# Patient Record
Sex: Male | Born: 1950 | Race: Black or African American | Hispanic: No | Marital: Married | Smoking: Former smoker
Health system: Southern US, Community
[De-identification: ages and names within clinical notes are randomized; demographics above are authoritative.]

## PROBLEM LIST (undated history)

## (undated) DIAGNOSIS — D649 Anemia, unspecified: Secondary | ICD-10-CM

## (undated) DIAGNOSIS — H269 Unspecified cataract: Secondary | ICD-10-CM

## (undated) DIAGNOSIS — I10 Essential (primary) hypertension: Secondary | ICD-10-CM

## (undated) DIAGNOSIS — N189 Chronic kidney disease, unspecified: Secondary | ICD-10-CM

## (undated) DIAGNOSIS — K591 Functional diarrhea: Secondary | ICD-10-CM

## (undated) DIAGNOSIS — N529 Male erectile dysfunction, unspecified: Secondary | ICD-10-CM

## (undated) DIAGNOSIS — M1990 Unspecified osteoarthritis, unspecified site: Secondary | ICD-10-CM

## (undated) DIAGNOSIS — G629 Polyneuropathy, unspecified: Secondary | ICD-10-CM

## (undated) HISTORY — PX: COLONOSCOPY: SHX174

## (undated) HISTORY — PX: PROSTATE SURGERY: SHX751

## (undated) HISTORY — DX: Unspecified cataract: H26.9

## (undated) HISTORY — DX: Essential (primary) hypertension: I10

## (undated) HISTORY — PX: KIDNEY TRANSPLANT: SHX239

## (undated) HISTORY — PX: UPPER GASTROINTESTINAL ENDOSCOPY: SHX188

## (undated) HISTORY — DX: Polyneuropathy, unspecified: G62.9

## (undated) HISTORY — DX: Male erectile dysfunction, unspecified: N52.9

## (undated) HISTORY — PX: APPENDECTOMY: SHX54

---

## 1997-12-13 ENCOUNTER — Emergency Department (HOSPITAL_COMMUNITY): Admission: EM | Admit: 1997-12-13 | Discharge: 1997-12-13 | Payer: Self-pay | Admitting: Emergency Medicine

## 1997-12-25 ENCOUNTER — Emergency Department (HOSPITAL_COMMUNITY): Admission: EM | Admit: 1997-12-25 | Discharge: 1997-12-25 | Payer: Self-pay | Admitting: Emergency Medicine

## 1998-05-21 ENCOUNTER — Emergency Department (HOSPITAL_COMMUNITY): Admission: EM | Admit: 1998-05-21 | Discharge: 1998-05-21 | Payer: Self-pay | Admitting: Emergency Medicine

## 1998-07-05 ENCOUNTER — Encounter: Admission: RE | Admit: 1998-07-05 | Discharge: 1998-07-11 | Payer: Self-pay | Admitting: Orthopedic Surgery

## 1998-10-08 ENCOUNTER — Encounter: Admission: RE | Admit: 1998-10-08 | Discharge: 1998-10-10 | Payer: Self-pay | Admitting: Orthopedic Surgery

## 1999-01-06 ENCOUNTER — Encounter: Admission: RE | Admit: 1999-01-06 | Discharge: 1999-01-10 | Payer: Self-pay | Admitting: Orthopedic Surgery

## 1999-04-14 ENCOUNTER — Encounter: Admission: RE | Admit: 1999-04-14 | Discharge: 1999-07-13 | Payer: Self-pay | Admitting: Orthopedic Surgery

## 2000-08-25 ENCOUNTER — Encounter: Admission: RE | Admit: 2000-08-25 | Discharge: 2000-08-30 | Payer: Self-pay | Admitting: Internal Medicine

## 2000-12-08 ENCOUNTER — Encounter: Admission: RE | Admit: 2000-12-08 | Discharge: 2000-12-14 | Payer: Self-pay | Admitting: Surgery

## 2001-03-30 ENCOUNTER — Encounter: Admission: RE | Admit: 2001-03-30 | Discharge: 2001-04-04 | Payer: Self-pay | Admitting: Internal Medicine

## 2007-02-02 ENCOUNTER — Ambulatory Visit: Payer: Self-pay | Admitting: Family Medicine

## 2007-02-02 DIAGNOSIS — I10 Essential (primary) hypertension: Secondary | ICD-10-CM

## 2007-02-02 LAB — CONVERTED CEMR LAB
Alkaline Phosphatase: 50 units/L (ref 39–117)
BUN: 7 mg/dL (ref 6–23)
Basophils Relative: 0.2 % (ref 0.0–1.0)
Bilirubin, Direct: 0.2 mg/dL (ref 0.0–0.3)
CO2: 33 meq/L — ABNORMAL HIGH (ref 19–32)
Cholesterol: 172 mg/dL (ref 0–200)
Eosinophils Absolute: 0 10*3/uL (ref 0.0–0.6)
GFR calc Af Amer: 149 mL/min
GFR calc non Af Amer: 123 mL/min
Glucose, Bld: 347 mg/dL — ABNORMAL HIGH (ref 70–99)
HDL: 36.3 mg/dL — ABNORMAL LOW (ref >39.0)
Hemoglobin: 14.7 g/dL (ref 13.0–17.0)
Lymphocytes Relative: 24.4 % (ref 12.0–46.0)
MCHC: 35 g/dL (ref 30.0–36.0)
MCV: 86.6 fL (ref 78.0–100.0)
Monocytes Absolute: 0.5 10*3/uL (ref 0.2–0.7)
Monocytes Relative: 8 % (ref 3.0–11.0)
Neutro Abs: 3.8 10*3/uL (ref 1.4–7.7)
Potassium: 3.8 meq/L (ref 3.5–5.1)
TSH: 1.45 microintl units/mL (ref 0.35–5.50)
Total Protein: 6.7 g/dL (ref 6.0–8.3)
Triglycerides: 132 mg/dL (ref 0–149)
VLDL: 26 mg/dL (ref 0–40)

## 2007-02-07 ENCOUNTER — Ambulatory Visit: Payer: Self-pay | Admitting: Family Medicine

## 2007-02-14 ENCOUNTER — Ambulatory Visit: Payer: Self-pay | Admitting: Family Medicine

## 2007-02-14 DIAGNOSIS — E139 Other specified diabetes mellitus without complications: Secondary | ICD-10-CM | POA: Insufficient documentation

## 2007-03-16 ENCOUNTER — Ambulatory Visit: Payer: Self-pay | Admitting: Family Medicine

## 2007-03-16 LAB — CONVERTED CEMR LAB: Blood Glucose, Fingerstick: 75

## 2007-04-25 ENCOUNTER — Ambulatory Visit: Payer: Self-pay | Admitting: Family Medicine

## 2007-05-20 ENCOUNTER — Telehealth: Payer: Self-pay | Admitting: Family Medicine

## 2007-07-20 ENCOUNTER — Ambulatory Visit: Payer: Self-pay | Admitting: Family Medicine

## 2007-07-20 DIAGNOSIS — G609 Hereditary and idiopathic neuropathy, unspecified: Secondary | ICD-10-CM

## 2007-07-20 DIAGNOSIS — I10 Essential (primary) hypertension: Secondary | ICD-10-CM | POA: Insufficient documentation

## 2007-07-20 LAB — CONVERTED CEMR LAB
Hgb A1c MFr Bld: 6.7 % — ABNORMAL HIGH (ref 4.6–6.0)
LDL Cholesterol: 93 mg/dL (ref 0–99)
Total CHOL/HDL Ratio: 3.3

## 2007-08-23 ENCOUNTER — Ambulatory Visit: Payer: Self-pay | Admitting: Family Medicine

## 2008-02-22 ENCOUNTER — Ambulatory Visit: Payer: Self-pay | Admitting: Family Medicine

## 2008-06-05 ENCOUNTER — Ambulatory Visit: Payer: Self-pay | Admitting: Family Medicine

## 2008-06-05 LAB — CONVERTED CEMR LAB
AST: 15 units/L (ref 0–37)
Albumin: 3.9 g/dL (ref 3.5–5.2)
BUN: 9 mg/dL (ref 6–23)
Basophils Relative: 0.7 % (ref 0.0–3.0)
Chloride: 105 meq/L (ref 96–112)
Creatinine, Ser: 0.7 mg/dL (ref 0.4–1.5)
Creatinine,U: 150.7 mg/dL
Eosinophils Absolute: 0.2 10*3/uL (ref 0.0–0.7)
Eosinophils Relative: 3.4 % (ref 0.0–5.0)
GFR calc non Af Amer: 123 mL/min
HCT: 43.4 % (ref 39.0–52.0)
Hemoglobin: 15.2 g/dL (ref 13.0–17.0)
Hgb A1c MFr Bld: 10.1 % — ABNORMAL HIGH (ref 4.6–6.0)
MCV: 88.9 fL (ref 78.0–100.0)
Monocytes Absolute: 0.4 10*3/uL (ref 0.1–1.0)
Neutrophils Relative %: 56.6 % (ref 43.0–77.0)
RBC: 4.88 M/uL (ref 4.22–5.81)
Specific Gravity, Urine: 1.025
WBC Urine, dipstick: NEGATIVE
WBC: 5 10*3/uL (ref 4.5–10.5)
pH: 6

## 2008-06-12 ENCOUNTER — Ambulatory Visit: Payer: Self-pay | Admitting: Family Medicine

## 2008-06-12 DIAGNOSIS — F528 Other sexual dysfunction not due to a substance or known physiological condition: Secondary | ICD-10-CM

## 2008-06-13 ENCOUNTER — Telehealth: Payer: Self-pay | Admitting: *Deleted

## 2008-06-14 ENCOUNTER — Telehealth: Payer: Self-pay | Admitting: Family Medicine

## 2008-06-20 ENCOUNTER — Ambulatory Visit: Payer: Self-pay | Admitting: Family Medicine

## 2008-07-13 ENCOUNTER — Telehealth: Payer: Self-pay | Admitting: Family Medicine

## 2008-08-16 ENCOUNTER — Telehealth: Payer: Self-pay | Admitting: Family Medicine

## 2008-08-16 ENCOUNTER — Ambulatory Visit: Payer: Self-pay | Admitting: Family Medicine

## 2008-08-16 LAB — CONVERTED CEMR LAB
BUN: 11 mg/dL (ref 6–23)
CO2: 31 meq/L (ref 19–32)
Chloride: 105 meq/L (ref 96–112)
Glucose, Bld: 164 mg/dL — ABNORMAL HIGH (ref 70–99)
Potassium: 4.3 meq/L (ref 3.5–5.1)

## 2008-08-23 ENCOUNTER — Ambulatory Visit: Payer: Self-pay | Admitting: Family Medicine

## 2008-08-24 ENCOUNTER — Ambulatory Visit: Payer: Self-pay | Admitting: Family Medicine

## 2008-09-04 LAB — CONVERTED CEMR LAB
ALT: 19 units/L (ref 0–53)
AST: 16 units/L (ref 0–37)
Albumin: 3.9 g/dL (ref 3.5–5.2)
Alkaline Phosphatase: 46 units/L (ref 39–117)
Basophils Relative: 0.4 % (ref 0.0–3.0)
Eosinophils Relative: 3.2 % (ref 0.0–5.0)
Lymphocytes Relative: 27.7 % (ref 12.0–46.0)
Lymphs Abs: 1.6 10*3/uL (ref 0.7–4.0)
Monocytes Absolute: 0.5 10*3/uL (ref 0.1–1.0)
Monocytes Relative: 8.7 % (ref 3.0–12.0)
Neutro Abs: 3.5 10*3/uL (ref 1.4–7.7)
Platelets: 236 10*3/uL (ref 150.0–400.0)
RDW: 12 % (ref 11.5–14.6)
Total Protein: 7.3 g/dL (ref 6.0–8.3)

## 2008-11-22 ENCOUNTER — Ambulatory Visit: Payer: Self-pay | Admitting: Family Medicine

## 2008-11-22 LAB — CONVERTED CEMR LAB
BUN: 13 mg/dL (ref 6–23)
Creatinine, Ser: 0.8 mg/dL (ref 0.4–1.5)
GFR calc non Af Amer: 126.86 mL/min (ref >60)
Glucose, Bld: 103 mg/dL — ABNORMAL HIGH (ref 70–99)
Potassium: 4 meq/L (ref 3.5–5.1)

## 2008-11-26 ENCOUNTER — Telehealth: Payer: Self-pay | Admitting: *Deleted

## 2008-11-29 ENCOUNTER — Ambulatory Visit: Payer: Self-pay | Admitting: Family Medicine

## 2009-02-22 ENCOUNTER — Ambulatory Visit: Payer: Self-pay | Admitting: Family Medicine

## 2009-02-22 LAB — CONVERTED CEMR LAB
BUN: 12 mg/dL (ref 6–23)
Calcium: 8.9 mg/dL (ref 8.4–10.5)
Chloride: 99 meq/L (ref 96–112)
Creatinine, Ser: 1 mg/dL (ref 0.4–1.5)
Hgb A1c MFr Bld: 6.2 % (ref 4.6–6.5)

## 2009-03-01 ENCOUNTER — Ambulatory Visit: Payer: Self-pay | Admitting: Family Medicine

## 2009-03-27 ENCOUNTER — Telehealth: Payer: Self-pay | Admitting: Family Medicine

## 2009-04-01 ENCOUNTER — Ambulatory Visit: Payer: Self-pay | Admitting: Family Medicine

## 2009-04-29 ENCOUNTER — Ambulatory Visit: Payer: Self-pay | Admitting: Family Medicine

## 2009-06-07 ENCOUNTER — Ambulatory Visit: Payer: Self-pay | Admitting: Family Medicine

## 2009-06-07 LAB — CONVERTED CEMR LAB
Basophils Relative: 0.5 % (ref 0.0–3.0)
Bilirubin Urine: NEGATIVE
Calcium: 9.6 mg/dL (ref 8.4–10.5)
Creatinine, Ser: 0.8 mg/dL (ref 0.4–1.5)
Eosinophils Relative: 6.1 % — ABNORMAL HIGH (ref 0.0–5.0)
GFR calc non Af Amer: 126.63 mL/min (ref >60)
HCT: 41.8 % (ref 39.0–52.0)
HDL: 42.6 mg/dL (ref >39.00)
Hgb A1c MFr Bld: 6.4 % (ref 4.6–6.5)
LDL Cholesterol: 90 mg/dL (ref 0–99)
Lymphs Abs: 1.8 10*3/uL (ref 0.7–4.0)
MCV: 89.5 fL (ref 78.0–100.0)
Microalb, Ur: 7 mg/dL — ABNORMAL HIGH (ref 0.0–1.9)
Monocytes Absolute: 0.5 10*3/uL (ref 0.1–1.0)
Neutrophils Relative %: 57.1 % (ref 43.0–77.0)
Nitrite: NEGATIVE
RBC: 4.67 M/uL (ref 4.22–5.81)
TSH: 1.86 microintl units/mL (ref 0.35–5.50)
Total Bilirubin: 0.9 mg/dL (ref 0.3–1.2)
Total CHOL/HDL Ratio: 4
Triglycerides: 145 mg/dL (ref 0.0–149.0)
Urobilinogen, UA: 0.2
WBC: 6.4 10*3/uL (ref 4.5–10.5)

## 2009-06-14 ENCOUNTER — Ambulatory Visit: Payer: Self-pay | Admitting: Family Medicine

## 2009-06-14 LAB — HM DIABETES FOOT EXAM

## 2009-06-28 ENCOUNTER — Ambulatory Visit: Payer: Self-pay | Admitting: Family Medicine

## 2009-07-26 ENCOUNTER — Ambulatory Visit: Payer: Self-pay | Admitting: Family Medicine

## 2009-07-29 LAB — CONVERTED CEMR LAB
CO2: 30 meq/L (ref 19–32)
Calcium: 9.3 mg/dL (ref 8.4–10.5)
Hgb A1c MFr Bld: 7.2 % — ABNORMAL HIGH (ref 4.6–6.5)
Sodium: 141 meq/L (ref 135–145)

## 2009-10-09 ENCOUNTER — Telehealth: Payer: Self-pay | Admitting: Family Medicine

## 2009-10-24 ENCOUNTER — Ambulatory Visit: Payer: Self-pay | Admitting: Family Medicine

## 2009-10-24 LAB — CONVERTED CEMR LAB
GFR calc non Af Amer: 67.98 mL/min (ref >60)
Glucose, Bld: 90 mg/dL (ref 70–99)
Hgb A1c MFr Bld: 6.2 % (ref 4.6–6.5)
Potassium: 5 meq/L (ref 3.5–5.1)
Sodium: 143 meq/L (ref 135–145)

## 2009-10-31 ENCOUNTER — Ambulatory Visit: Payer: Self-pay | Admitting: Family Medicine

## 2009-10-31 DIAGNOSIS — R05 Cough: Secondary | ICD-10-CM

## 2010-01-30 ENCOUNTER — Ambulatory Visit: Payer: Self-pay | Admitting: Family Medicine

## 2010-01-31 LAB — CONVERTED CEMR LAB
BUN: 12 mg/dL (ref 6–23)
Chloride: 104 meq/L (ref 96–112)
Hgb A1c MFr Bld: 7.4 % — ABNORMAL HIGH (ref 4.6–6.5)
Potassium: 4.5 meq/L (ref 3.5–5.1)
Sodium: 143 meq/L (ref 135–145)

## 2010-04-28 ENCOUNTER — Emergency Department (HOSPITAL_COMMUNITY)
Admission: EM | Admit: 2010-04-28 | Discharge: 2010-04-28 | Payer: Self-pay | Source: Home / Self Care | Admitting: Emergency Medicine

## 2010-06-16 ENCOUNTER — Other Ambulatory Visit: Payer: Self-pay | Admitting: Family Medicine

## 2010-06-16 ENCOUNTER — Ambulatory Visit
Admission: RE | Admit: 2010-06-16 | Discharge: 2010-06-16 | Payer: Self-pay | Source: Home / Self Care | Attending: Family Medicine | Admitting: Family Medicine

## 2010-06-16 LAB — LIPID PANEL
Cholesterol: 208 mg/dL — ABNORMAL HIGH (ref 0–200)
Total CHOL/HDL Ratio: 5
Triglycerides: 284 mg/dL — ABNORMAL HIGH (ref 0.0–149.0)

## 2010-06-16 LAB — CBC WITH DIFFERENTIAL/PLATELET
Basophils Absolute: 0 10*3/uL (ref 0.0–0.1)
Basophils Relative: 0.9 % (ref 0.0–3.0)
Eosinophils Absolute: 0.1 10*3/uL (ref 0.0–0.7)
Lymphocytes Relative: 33.4 % (ref 12.0–46.0)
MCHC: 35 g/dL (ref 30.0–36.0)
MCV: 88.1 fl (ref 78.0–100.0)
Monocytes Absolute: 0.4 10*3/uL (ref 0.1–1.0)
Neutrophils Relative %: 54.5 % (ref 43.0–77.0)
Platelets: 270 10*3/uL (ref 150.0–400.0)
RDW: 12.9 % (ref 11.5–14.6)

## 2010-06-16 LAB — BASIC METABOLIC PANEL
CO2: 30 mEq/L (ref 19–32)
Calcium: 9.5 mg/dL (ref 8.4–10.5)
Chloride: 101 mEq/L (ref 96–112)
Glucose, Bld: 206 mg/dL — ABNORMAL HIGH (ref 70–99)
Sodium: 136 mEq/L (ref 135–145)

## 2010-06-16 LAB — HEMOGLOBIN A1C: Hgb A1c MFr Bld: 7.8 % — ABNORMAL HIGH (ref 4.6–6.5)

## 2010-06-16 LAB — HEPATIC FUNCTION PANEL
ALT: 17 U/L (ref 0–53)
Albumin: 4 g/dL (ref 3.5–5.2)
Alkaline Phosphatase: 47 U/L (ref 39–117)
Bilirubin, Direct: 0.1 mg/dL (ref 0.0–0.3)
Total Protein: 7.2 g/dL (ref 6.0–8.3)

## 2010-06-16 LAB — CONVERTED CEMR LAB
Bilirubin Urine: NEGATIVE
pH: 5

## 2010-06-16 LAB — PSA: PSA: 2.49 ng/mL (ref 0.10–4.00)

## 2010-06-16 LAB — MICROALBUMIN / CREATININE URINE RATIO: Microalb Creat Ratio: 7.7 mg/g (ref 0.0–30.0)

## 2010-06-19 NOTE — Assessment & Plan Note (Signed)
Summary: 1 month rov/njr   Vital Signs:  Patient profile:   60 year old male Weight:      182 pounds Temp:     98.5 degrees F oral BP sitting:   130 / 80  (left arm) Cuff size:   regular  Vitals Entered By: Westley Hummer CMA Deborra Medina) (July 26, 2009 10:00 AM)  Reason for Visit follow up office visit  History of Present Illness: [name] [name] is a 60 year old, married male, nonsmoker, who comes in today for follow-up of hypertension and diabetes.  We increased his beta blocker a month ago because his blood pressure was elevated.  He is now taking 100 mg of Tenormin in the morning and 50 mg at bedtime.  BP has dropped down to normal today 130/80.  His blood sugar continues to remain normal.  The low hundred.  His A1c in January was 6.4%.  Will repeat A1c today  Allergies: No Known Drug Allergies  Past History:  Past medical, surgical, family and social histories (including risk factors) reviewed for relevance to current acute and chronic problems.  Past Medical History: Reviewed history from 06/12/2008 and no changes required. Diabetes mellitus, type II Hypertension Peripheral neuropathy ED Diabetes mellitus, type I  Family History: Reviewed history from 07/20/2007 and no changes required. Family History Diabetes 1st degree relative Family History Hypertension  Social History: Reviewed history from 06/14/2009 and no changes required. Never Smoked Alcohol use-no Drug use-no Regular exercise-yes Occupation:  [employer]   Review of Systems      See HPI  Physical Exam  General:  Well-developed,well-nourished,in no acute distress; alert,appropriate and cooperative throughout examination   Impression & Recommendations:  Problem # 1:  DIABETES MELLITUS, TYPE I (ICD-250.01) Assessment Unchanged  His updated medication list for this problem includes:    Glucotrol 5 Mg Tabs (Glipizide) .Marland Kitchen... Take 1 tablet by mouth two times a day    Glucophage 1000 Mg Tabs (Metformin hcl)  .Marland Kitchen... Take 1 tablet by mouth two times a day    Zestril 40 Mg Tabs (Lisinopril) .Marland Kitchen... 2 by mouth qam    Aspirin Ec 325 Mg Tbec (Aspirin) ..... Once daily    Lantus Solostar 100 Unit/ml Soln (Insulin glargine) .Marland Kitchen... 16 u at bedtime  Orders: Venipuncture [id number]) TLB-BMP (Basic Metabolic Panel-BMET) (99991111) TLB-A1C / Hgb A1C (Glycohemoglobin) (83036-A1C)  Problem # 2:  HYPERTENSION (ICD-401.9) Assessment: Improved  His updated medication list for this problem includes:    Zestril 40 Mg Tabs (Lisinopril) .Marland Kitchen... 2 by mouth qam    Tenormin 50 Mg Tabs (Atenolol) .Marland Kitchen... 2 in am, 1 in pm    Hydrochlorothiazide 25 Mg Tabs (Hydrochlorothiazide) .Marland Kitchen... Take 1 tablet by mouth every morning  Orders: Venipuncture [id number]) TLB-BMP (Basic Metabolic Panel-BMET) (99991111) TLB-A1C / Hgb A1C (Glycohemoglobin) (83036-A1C)  Complete Medication List: 1)  Glucotrol 5 Mg Tabs (Glipizide) .... Take 1 tablet by mouth two times a day 2)  Glucophage 1000 Mg Tabs (Metformin hcl) .... Take 1 tablet by mouth two times a day 3)  Amitriptyline Hcl 25 Mg Tabs (Amitriptyline hcl) .Marland Kitchen.. 1 tab @ bedtime 4)  Viagra 100 Mg Tabs (Sildenafil citrate) .... Uad 5)  Zestril 40 Mg Tabs (Lisinopril) .... 2 by mouth qam 6)  Aspirin Ec 325 Mg Tbec (Aspirin) .... Once daily 7)  Lantus Solostar 100 Unit/ml Soln (Insulin glargine) .Marland Kitchen.. 16 u at bedtime 8)  Accu-chek Aviva Strp (Glucose blood) .... Uad 9)  Bd Pen Needle Ultrafine 29g X 12.87mm Misc (Insulin pen needle) .... Use  once daily for glucose 10)  Tenormin 50 Mg Tabs (Atenolol) .... 2 in am, 1 in pm 11)  Hydrochlorothiazide 25 Mg Tabs (Hydrochlorothiazide) .... Take 1 tablet by mouth every morning  Patient Instructions: 1)  continue your current medications.  We will call you  the report on your A1c 2)  Please schedule a follow-up appointment in 3 months. 3)  BMP prior to visit, ICD-9: 4)  HbgA1C prior to visit, ICD-9:

## 2010-06-19 NOTE — Assessment & Plan Note (Signed)
Summary: cpx/njr   Vital Signs:  Patient profile:   60 year old male Height:      69 inches Weight:      180 pounds BMI:     26.68 Temp:     98.0 degrees F oral BP sitting:   160 / 90  (left arm) Cuff size:   regular  Vitals Entered By: Westley Hummer CMA Deborra Medina) (June 14, 2009 8:39 AM)  Reason for Visit cpx  History of Present Illness: [name] [name] is a delightful, 60 year old male, nonsmoker, married, who comes in today for a ration of diabetes, hypertension, erectile dysfunction.  His diabetes is managed with Glucotrol 5 mg b.i.d., Glucophage 1000 mg b.i.d., insulin, Lantus 16 units nightly  Fasting blood sugars at home average 85.  Hemoglobin A1c today 6.4%.  His hypertension is treated with Zestril 80 mg daily, Tenormin, 50 mg b.i.d. BP is averaging systolic [id number] to 0000000 diastolic 80 to 90.  We discussed the goal of 130/80.  Pulse is around 70 and regular.  No side effects from Tenormin.  Will add low-dose diuretic.  He uses Viagra 100 mg p.r.n. for rectal dysfunction.  He uses Elavil 25 mg nightly for symptoms of mild peripheral neuropathy.  He also takes one aspirin tablet daily.  Tetanus 2004 seasonal flu 2010 Pneumovax 2010 exam September 2009 and an optometrist office.  Recommend he see an ophthalmologist because of his diabetes referred to Dr. Darleen Crocker.  Allergies (verified): No Known Drug Allergies  Past History:  Past medical, surgical, family and social histories (including risk factors) reviewed, and no changes noted (except as noted below).  Past Medical History: Reviewed history from 06/12/2008 and no changes required. Diabetes mellitus, type II Hypertension Peripheral neuropathy ED Diabetes mellitus, type I  Family History: Reviewed history from 07/20/2007 and no changes required. Family History Diabetes 1st degree relative Family History Hypertension  Social History: Reviewed history from 07/20/2007 and no changes required. Never Smoked Alcohol  use-no Drug use-no Regular exercise-yes Occupation:  [employer]   Review of Systems      See HPI  Physical Exam  General:  Well-developed,well-nourished,in no acute distress; alert,appropriate and cooperative throughout examination Head:  Normocephalic and atraumatic without obvious abnormalities. No apparent alopecia or balding. Eyes:  No corneal or conjunctival inflammation noted. EOMI. Perrla. Funduscopic exam benign, without hemorrhages, exudates or papilledema. Vision grossly normal. Ears:  External ear exam shows no significant lesions or deformities.  Otoscopic examination reveals clear canals, tympanic membranes are intact bilaterally without bulging, retraction, inflammation or discharge. Hearing is grossly normal bilaterally. Nose:  External nasal examination shows no deformity or inflammation. Nasal mucosa are pink and moist without lesions or exudates. Mouth:  Oral mucosa and oropharynx without lesions or exudates.  Teeth in good repair. Neck:  No deformities, masses, or tenderness noted. Chest Wall:  No deformities, masses, tenderness or gynecomastia noted. Breasts:  No masses or gynecomastia noted Lungs:  Normal respiratory effort, chest expands symmetrically. Lungs are clear to auscultation, no crackles or wheezes. Heart:  Normal rate and regular rhythm. S1 and S2 normal without gallop, murmur, click, rub or other extra sounds. Abdomen:  Bowel sounds positive,abdomen soft and non-tender without masses, organomegaly or hernias noted. Rectal:  No external abnormalities noted. Normal sphincter tone. No rectal masses or tenderness. Genitalia:  Testes bilaterally descended without nodularity, tenderness or masses. No scrotal masses or lesions. No penis lesions or urethral discharge. Prostate:  Prostate gland firm and smooth, no enlargement, nodularity, tenderness, mass, asymmetry or induration.  Msk:  No deformity or scoliosis noted of thoracic or lumbar spine.   Pulses:  R and L  carotid,radial,femoral,dorsalis pedis and posterior tibial pulses are full and equal bilaterally Extremities:  No clubbing, cyanosis, edema, or deformity noted with normal full range of motion of all joints.   Neurologic:  No cranial nerve deficits noted. Station and gait are normal. Plantar reflexes are down-going bilaterally. DTRs are symmetrical throughout. Sensory, motor and coordinative functions appear intact. Skin:  Intact without suspicious lesions or rashes...................Marland KitchenAllergies:  10 nails infected with fungus, deformed, trimmed by me Cervical Nodes:  No lymphadenopathy noted Axillary Nodes:  No palpable lymphadenopathy Inguinal Nodes:  No significant adenopathy Psych:  Cognition and judgment appear intact. Alert and cooperative with normal attention span and concentration. No apparent delusions, illusions, hallucinations  Diabetes Management Exam:    Foot Exam (with socks and/or shoes not present):       Sensory-Pinprick/Light touch:          Left medial foot (L-4): normal          Left dorsal foot (L-5): normal          Left lateral foot (S-1): normal          Right medial foot (L-4): normal          Right dorsal foot (L-5): normal          Right lateral foot (S-1): normal       Sensory-Monofilament:          Left foot: normal          Right foot: normal       Inspection:          Left foot: normal          Right foot: normal       Nails:          Left foot: normal          Right foot: normal    Eye Exam:       Eye Exam done elsewhere          Date: 01/30/2008          Results: normal          Done by: optom    Impression & Recommendations:  Problem # 1:  ERECTILE DYSFUNCTION, MILD (ICD-302.72) Assessment Improved  His updated medication list for this problem includes:    Viagra 100 Mg Tabs (Sildenafil citrate) ..... Uad  Orders: Prescription Created Electronically [phone])  Problem # 2:  DIABETES MELLITUS, TYPE I (ICD-250.01) Assessment: Improved  His  updated medication list for this problem includes:    Glucotrol 5 Mg Tabs (Glipizide) .Marland Kitchen... Take 1 tablet by mouth two times a day    Glucophage 1000 Mg Tabs (Metformin hcl) .Marland Kitchen... Take 1 tablet by mouth two times a day    Zestril 40 Mg Tabs (Lisinopril) .Marland Kitchen... 2 by mouth qam    Aspirin Ec 325 Mg Tbec (Aspirin) ..... Once daily    Lantus Solostar 100 Unit/ml Soln (Insulin glargine) .Marland Kitchen... 16 u at bedtime  Orders: Prescription Created Electronically [phone])  Problem # 3:  HYPERTENSION (ICD-401.9) Assessment: Deteriorated  His updated medication list for this problem includes:    Zestril 40 Mg Tabs (Lisinopril) .Marland Kitchen... 2 by mouth qam    Tenormin 50 Mg Tabs (Atenolol) .Marland Kitchen... Take 1 tablet by mouth two times a day    Hydrochlorothiazide 25 Mg Tabs (Hydrochlorothiazide) .Marland Kitchen... Take 1 tablet by mouth every morning  Orders:  Prescription Created Electronically [id number])  Problem # 4:  PERIPHERAL NEUROPATHY (ICD-356.9) Assessment: Improved  Orders: Prescription Created Electronically [phone])  Complete Medication List: 1)  Glucotrol 5 Mg Tabs (Glipizide) .... Take 1 tablet by mouth two times a day 2)  Glucophage 1000 Mg Tabs (Metformin hcl) .... Take 1 tablet by mouth two times a day 3)  Amitriptyline Hcl 25 Mg Tabs (Amitriptyline hcl) .Marland Kitchen.. 1 tab @ bedtime 4)  Viagra 100 Mg Tabs (Sildenafil citrate) .... Uad 5)  Zestril 40 Mg Tabs (Lisinopril) .... 2 by mouth qam 6)  Aspirin Ec 325 Mg Tbec (Aspirin) .... Once daily 7)  Lantus Solostar 100 Unit/ml Soln (Insulin glargine) .Marland Kitchen.. 16 u at bedtime 8)  Accu-chek Aviva Strp (Glucose blood) .... Uad 9)  Bd Pen Needle Ultrafine 29g X 12.21mm Misc (Insulin pen needle) .... Use once daily for glucose 10)  Tenormin 50 Mg Tabs (Atenolol) .... Take 1 tablet by mouth two times a day 11)  Hydrochlorothiazide 25 Mg Tabs (Hydrochlorothiazide) .... Take 1 tablet by mouth every morning   Patient Instructions: 1)  said to her feet, weekly, and file in nails.  The  goal is to filea way.  All 10 dead nails 2)  add  a low dose diuretic hydrochlorothiazide 25 mg q.a.m.Marland Kitchen  Continue your other blood pressure medication.  Check a morning blood pressure daily.  Return in two weeks for follow-up 3)  Please schedule a follow-up appointment in 3 months. 4)  Schedule a colonoscopy/sigmoidoscopy to help detect colon cancer. 5)  Take an Aspirin every day. 6)  Check your blood sugars regularly. If your readings are usually above : or below 70 you should contact our office. 7)  It is important that your Diabetic A1c level is checked every 3 months. 8)  See your eye doctor yearly to check for diabetic eye damage. 9)  Check your feet each night for sore areas, calluses or signs of infection. 10)  Check your Blood Pressure regularly. If it is above: you should make an appointment. 11)  BMP prior to visit, ICD-9:..........250.01 12)  HbgA1C prior to visit, ICD-9: Prescriptions: TENORMIN 50 MG TABS (ATENOLOL) Take 1 tablet by mouth two times a day  #200 x 3   Entered and Authorized by:   Dorena Cookey MD   Signed by:   Dorena Cookey MD on 06/14/2009   Method used:   Electronically to        [facility] (retail)       [place], [place]  [id number]       Ph: [phone]       Fax: [phone]   [id number] BD PEN NEEDLE ULTRAFINE 29G X 12.7MM MISC (INSULIN PEN NEEDLE) use once daily for glucose  #100 x 3   Entered and Authorized by:   Dorena Cookey MD   Signed by:   Dorena Cookey MD on 06/14/2009   Method used:   Electronically to        [facility] (retail)       [place], [place]  [id number]       Ph: [phone]       Fax: [phone]   [id number] ACCU-CHEK AVIVA  STRP (GLUCOSE BLOOD) UAD  #200 x 6   Entered and Authorized by:   Dorena Cookey MD  Signed by:   Dorena Cookey MD on 06/14/2009   Method used:   Electronically to        [facility] (retail)       [place], [place]  [id number]       Ph: [phone]       Fax: [phone]   RxID:   [id number] LANTUS SOLOSTAR 100 UNIT/ML SOLN (INSULIN GLARGINE) 16 u at bedtime  #2 x 3   Entered and Authorized by:   Dorena Cookey MD   Signed by:   Dorena Cookey MD on 06/14/2009   Method used:   Electronically to        [facility] (retail)       [place], [place]  [id number]       Ph: [phone]       Fax: [phone]   [id number] ZESTRIL 40 MG TABS (LISINOPRIL) 2 by mouth qam  #200 x 3   Entered and Authorized by:   Dorena Cookey MD   Signed by:   Dorena Cookey MD on 06/14/2009   Method used:   Electronically to        [facility] (retail)       [place], [place]  [id number]       Ph: [phone]       Fax: [phone]   [id number] VIAGRA 100 MG  TABS (SILDENAFIL CITRATE) UAD  #6 x 11   Entered and Authorized by:   Dorena Cookey MD   Signed by:   Dorena Cookey MD on 06/14/2009   Method used:   Electronically to        [facility] (retail)       [place], [place]  [id number]       Ph: [phone]       Fax: [phone]   [id number] AMITRIPTYLINE HCL 25 MG  TABS (AMITRIPTYLINE HCL) 1 tab @ bedtime  #100 x 3   Entered and Authorized by:   Dorena Cookey MD   Signed by:   Dorena Cookey MD on 06/14/2009   Method used:   Electronically to        [facility] (retail)       [place], [place]  [id number]       Ph: [phone]       Fax: [phone]   [id number] GLUCOPHAGE 1000 MG  TABS (METFORMIN HCL) Take 1 tablet by mouth two times a day  #200 x 3   Entered and Authorized by:   Dorena Cookey MD   Signed by:   Dorena Cookey MD on 06/14/2009   Method used:   Electronically to         [facility] (retail)       [place], [place]  [id number]       Ph: [phone]       Fax: [phone]   [id number] GLUCOTROL 5 MG  TABS (GLIPIZIDE) Take 1 tablet by mouth two times a  day  #200 x 3   Entered and Authorized by:   Dorena Cookey MD   Signed by:   Dorena Cookey MD on 06/14/2009   Method used:   Electronically to        [facility] (retail)       [place], [place]  [id number]       Ph: [phone]       Fax: [phone]   [id number] HYDROCHLOROTHIAZIDE 25 MG TABS (HYDROCHLOROTHIAZIDE) Take 1 tablet by mouth every morning  #100 x 3   Entered and Authorized by:   Dorena Cookey MD   Signed by:   Dorena Cookey MD on 06/14/2009   Method used:   Electronically to        [facility] (retail)       [place], [place]  [id number]       Ph: [phone]       Fax: [phone]   RxID:   [id number]     Appended Document: cpx/njr     Vitals Entered By: Westley Hummer CMA Deborra Medina) (June 14, 2009 2:16 PM)  Allergies: No Known Drug Allergies   Complete Medication List: 1)  Glucotrol 5 Mg Tabs (Glipizide) .... Take 1 tablet by mouth two times a day 2)  Glucophage 1000 Mg Tabs (Metformin hcl) .... Take 1 tablet by mouth two times a day 3)  Amitriptyline Hcl 25 Mg Tabs (Amitriptyline hcl) .Marland Kitchen.. 1 tab @ bedtime 4)  Viagra 100 Mg Tabs (Sildenafil citrate) .... Uad 5)  Zestril 40 Mg Tabs (Lisinopril) .... 2 by mouth qam 6)  Aspirin Ec 325 Mg Tbec (Aspirin) .... Once daily 7)  Lantus Solostar 100 Unit/ml Soln (Insulin glargine) .Marland Kitchen.. 16 u at bedtime 8)  Accu-chek Aviva Strp (Glucose blood) .... Uad 9)  Bd Pen Needle Ultrafine 29g X 12.74mm Misc (Insulin pen needle) .... Use once daily for glucose 10)  Tenormin 50 Mg Tabs (Atenolol) .... Take 1 tablet by mouth two times a day 11)  Hydrochlorothiazide 25 Mg Tabs  (Hydrochlorothiazide) .... Take 1 tablet by mouth every morning  Other Orders: EKG w/ Interpretation (93000)

## 2010-06-19 NOTE — Assessment & Plan Note (Signed)
Summary: rov/mm pt rsc/njr   Vital Signs:  Patient profile:   60 year old male Weight:      182 pounds Temp:     98.2 degrees F oral BP sitting:   120 / 80  (left arm) Cuff size:   regular  Vitals Entered By: Westley Hummer CMA Deborra Medina) (October 31, 2009 9:05 AM) CC: follow-up visit    CC:  follow-up visit.  History of Present Illness: [name] [name] is a 60 year old male type I diabetic who comes back today for follow-up of diabetes.  He takes Glucotrol 5 mg b.i.d., Glucophage, 1000 mg b.i.d., and 12 units of Lantus nightly.  We decreased his dose from 16 to 12 because of hypoglycemia.  This had no more episodes of hypoglycemia.  A1c6.3 fasting sugar 90.  BP at home 120/80.since we stopped the ACE inhibitor.  The cough has gone away.  Allergies: No Known Drug Allergies  Past History:  Past medical, surgical, family and social histories (including risk factors) reviewed for relevance to current acute and chronic problems.  Past Medical History: Reviewed history from 06/12/2008 and no changes required. Diabetes mellitus, type II Hypertension Peripheral neuropathy ED Diabetes mellitus, type I  Family History: Reviewed history from 07/20/2007 and no changes required. Family History Diabetes 1st degree relative Family History Hypertension  Social History: Reviewed history from 06/14/2009 and no changes required. Never Smoked Alcohol use-no Drug use-no Regular exercise-yes Occupation:  [employer]   Review of Systems      See HPI  Physical Exam  General:  Well-developed,well-nourished,in no acute distress; alert,appropriate and cooperative throughout examination   Impression & Recommendations:  Problem # 1:  DIABETES MELLITUS, TYPE I (ICD-250.01) Assessment Improved  The following medications were removed from the medication list:    Zestril 40 Mg Tabs (Lisinopril) .Marland Kitchen... 2 by mouth qam His updated medication list for this problem includes:    Glucotrol 5 Mg Tabs  (Glipizide) .Marland Kitchen... Take 1 tablet by mouth two times a day    Glucophage 1000 Mg Tabs (Metformin hcl) .Marland Kitchen... Take 1 tablet by mouth two times a day    Aspirin Ec 325 Mg Tbec (Aspirin) ..... Once daily    Lantus Solostar 100 Unit/ml Soln (Insulin glargine) .Marland Kitchen... 12  u at bedtime    Cozaar 50 Mg Tabs (Losartan potassium) ..... One by mouth daily  Problem # 2:  HYPERTENSION (ICD-401.9) Assessment: Improved  The following medications were removed from the medication list:    Zestril 40 Mg Tabs (Lisinopril) .Marland Kitchen... 2 by mouth qam His updated medication list for this problem includes:    Tenormin 50 Mg Tabs (Atenolol) .Marland Kitchen... 2 in am, 1 in pm    Hydrochlorothiazide 25 Mg Tabs (Hydrochlorothiazide) .Marland Kitchen... Take 1 tablet by mouth every morning    Cozaar 50 Mg Tabs (Losartan potassium) ..... One by mouth daily  Complete Medication List: 1)  Glucotrol 5 Mg Tabs (Glipizide) .... Take 1 tablet by mouth two times a day 2)  Glucophage 1000 Mg Tabs (Metformin hcl) .... Take 1 tablet by mouth two times a day 3)  Amitriptyline Hcl 25 Mg Tabs (Amitriptyline hcl) .Marland Kitchen.. 1 tab @ bedtime 4)  Viagra 100 Mg Tabs (Sildenafil citrate) .... Uad 5)  Aspirin Ec 325 Mg Tbec (Aspirin) .... Once daily 6)  Lantus Solostar 100 Unit/ml Soln (Insulin glargine) .Marland Kitchen.. 12  u at bedtime 7)  Accu-chek Aviva Strp (Glucose blood) .... Uad 8)  Bd Pen Needle Ultrafine 29g X 12.34mm Misc (Insulin pen needle) .... Use  once daily for glucose 9)  Tenormin 50 Mg Tabs (Atenolol) .... 2 in am, 1 in pm 10)  Hydrochlorothiazide 25 Mg Tabs (Hydrochlorothiazide) .... Take 1 tablet by mouth every morning 11)  Cozaar 50 Mg Tabs (Losartan potassium) .... One by mouth daily  Patient Instructions: 1)  continue your current treatment program ........ check your blood pressure weekly and your blood sugar daily and also at any time you feel weak or lightheaded checked her blood sugar.  If u get sugar readings below 60.  Remember to eat something right away, and  then decreased her Lantus to 10 or 8 units the next night. 2)  Please schedule a follow-up appointment in 3 months. 3)  BMP prior to visit, ICD-9: 4)  HbgA1C prior to visit, ICD-9:           250.01

## 2010-06-19 NOTE — Assessment & Plan Note (Signed)
Summary: 2 wk rov/mm   Vital Signs:  Patient profile:   60 year old male Weight:      179 pounds Temp:     97.9 degrees F oral BP sitting:   134 / 94  (left arm) Cuff size:   regular  Vitals Entered By: Westley Hummer CMA Deborra Medina) (June 28, 2009 9:16 AM)  Reason for Visit follow up blood pressure  History of Present Illness: Kewan is a 13-year-old male with underlying type 1 diabetes, who comes back today for follow-up of hypertension.  His hypertension is treated with Zestril 80 mg daily, Inocor, thiazide 25 mg daily, 50 mg of Tenormin b.i.d.  BP 134/94.  Pulse 60 and regular.  Blood sugars 80 to 110  Allergies: No Known Drug Allergies  Review of Systems      See HPI  Physical Exam  General:  Well-developed,well-nourished,in no acute distress; alert,appropriate and cooperative throughout examination Heart:  134/94   Impression & Recommendations:  Problem # 1:  HYPERTENSION (ICD-401.9) Assessment Improved  His updated medication list for this problem includes:    Zestril 40 Mg Tabs (Lisinopril) .Marland Kitchen... 2 by mouth qam    Tenormin 50 Mg Tabs (Atenolol) .Marland Kitchen... 2 in am, 1 in pm    Hydrochlorothiazide 25 Mg Tabs (Hydrochlorothiazide) .Marland Kitchen... Take 1 tablet by mouth every morning  Orders: Prescription Created Electronically [id number])  Complete Medication List: 1)  Glucotrol 5 Mg Tabs (Glipizide) .... Take 1 tablet by mouth two times a day 2)  Glucophage 1000 Mg Tabs (Metformin hcl) .... Take 1 tablet by mouth two times a day 3)  Amitriptyline Hcl 25 Mg Tabs (Amitriptyline hcl) .Marland Kitchen.. 1 tab @ bedtime 4)  Viagra 100 Mg Tabs (Sildenafil citrate) .... Uad 5)  Zestril 40 Mg Tabs (Lisinopril) .... 2 by mouth qam 6)  Aspirin Ec 325 Mg Tbec (Aspirin) .... Once daily 7)  Lantus Solostar 100 Unit/ml Soln (Insulin glargine) .Marland Kitchen.. 16 u at bedtime 8)  Accu-chek Aviva Strp (Glucose blood) .... Uad 9)  Bd Pen Needle Ultrafine 29g X 12.59mm Misc (Insulin pen needle) .... Use once daily for  glucose 10)  Tenormin 50 Mg Tabs (Atenolol) .... 2 in am, 1 in pm 11)  Hydrochlorothiazide 25 Mg Tabs (Hydrochlorothiazide) .... Take 1 tablet by mouth every morning  Patient Instructions: 1)  increase the Tenormin to two tabs in the morning one tab at bedtime check a blood pressure daily.  Return in 4 weeks for follow-up Prescriptions: TENORMIN 50 MG TABS (ATENOLOL) 2 in am, 1 in pm  #300 x 3   Entered and Authorized by:   Dorena Cookey MD   Signed by:   Dorena Cookey MD on 06/28/2009   Method used:   Electronically to        [facility] (retail)       [place], [place]  [id number]       Ph: [phone]       Fax: [phone]   RxID:   [id number]

## 2010-06-19 NOTE — Progress Notes (Signed)
Summary: cough  Phone Note Call from Patient   Caller: Patient Call For: [name] Cookey MD Summary of Call: Pt is calling to ask Dr. Sherren Mocha to change his Lisinopril to a different RX.  He is coughing constantly. [facility]) Initial call taken by: Deanna Artis CMA,  Oct 09, 2009 11:46 AM  Follow-up for Phone Call        stop the ACE inhibitor begin Cozaar 50 mg daily, dispense 100 tablets.  BP check.  Q.a.m. follow-up 4 weeks refills x 3 Follow-up by: [name] Cookey MD,  Oct 09, 2009 11:52 AM    New/Updated Medications: COZAAR 50 MG TABS (LOSARTAN POTASSIUM) one by mouth daily Prescriptions: COZAAR 50 MG TABS (LOSARTAN POTASSIUM) one by mouth daily  #100 x 3   Entered by:   Deanna Artis CMA   Authorized by:   [name] Cookey MD   Signed by:   Deanna Artis CMA on 10/09/2009   Method used:   Electronically to        [id number] (retail)       [place], [place]  [id number]       Ph: [phone]       Fax: [phone]   RxID:   [id number]

## 2010-06-19 NOTE — Assessment & Plan Note (Signed)
Summary: 3 month fup//ccm   Vital Signs:  Patient profile:   60 year old male Weight:      183 pounds Temp:     98.1 degrees F oral BP sitting:   150 / 82  (right arm) Cuff size:   regular  Vitals Entered By: Clearnce Sorrel CMA (January 30, 2010 9:19 AM) CC: 3 mth fup BP, fasting to have blood work done, src Is Patient Diabetic? Yes   CC:  3 mth fup BP, fasting to have blood work done, and src.  History of Present Illness: [name] [name] is a 60 year old male, who comes in today for evaluation of hypertension and diabetes.  He takes 12 units of Lantus daily, along with Glucophage 1000 mg b.i.d., Glucotrol, 5 mg, b.i.d. he's not been checking his blood sugar.  Last A1c3 months ago, was 6.2.  BP 150/82.  He's not been checking his blood pressure at home.  He did not take his medication.  This morning in anticipating an obtaining lab work drawn today.  He does have a digital blood pressure cuff at home.  Preventive Screening-Counseling & Management  Alcohol-Tobacco     Smoking Status: quit     Year Quit: 1975  Current Medications (verified): 1)  Glucotrol 5 Mg  Tabs (Glipizide) .... Take 1 Tablet By Mouth Two Times A Day 2)  Glucophage 1000 Mg  Tabs (Metformin Hcl) .... Take 1 Tablet By Mouth Two Times A Day 3)  Amitriptyline Hcl 25 Mg  Tabs (Amitriptyline Hcl) .Marland Kitchen.. 1 Tab @ Bedtime 4)  Viagra 100 Mg  Tabs (Sildenafil Citrate) .... Uad 5)  Aspirin Ec 325 Mg Tbec (Aspirin) .... Once Daily 6)  Lantus Solostar 100 Unit/ml Soln (Insulin Glargine) .Marland Kitchen.. 12  U At Bedtime 7)  Accu-Chek Aviva  Strp (Glucose Blood) .... Uad 8)  Bd Pen Needle Ultrafine 29g X 12.35mm Misc (Insulin Pen Needle) .... Use Once Daily For Glucose 9)  Tenormin 50 Mg Tabs (Atenolol) .... 2 in Am, 1 in Pm 10)  Hydrochlorothiazide 25 Mg Tabs (Hydrochlorothiazide) .... Take 1 Tablet By Mouth Every Morning 11)  Cozaar 50 Mg Tabs (Losartan Potassium) .... One By Mouth Daily  Allergies (verified): No Known Drug  Allergies  Past History:  Past medical, surgical, family and social histories (including risk factors) reviewed for relevance to current acute and chronic problems.  Past Medical History: Reviewed history from 06/12/2008 and no changes required. Diabetes mellitus, type II Hypertension Peripheral neuropathy ED Diabetes mellitus, type I  Family History: Reviewed history from 07/20/2007 and no changes required. Family History Diabetes 1st degree relative Family History Hypertension  Social History: Reviewed history from 06/14/2009 and no changes required. Never Smoked Alcohol use-no Drug use-no Regular exercise-yes Occupation:  [employer]   Review of Systems      See HPI  Physical Exam  General:  Well-developed,well-nourished,in no acute distress; alert,appropriate and cooperative throughout examination   Impression & Recommendations:  Problem # 1:  DIABETES MELLITUS, TYPE I (ICD-250.01) Assessment Improved  His updated medication list for this problem includes:    Glucotrol 5 Mg Tabs (Glipizide) .Marland Kitchen... Take 1 tablet by mouth two times a day    Glucophage 1000 Mg Tabs (Metformin hcl) .Marland Kitchen... Take 1 tablet by mouth two times a day    Aspirin Ec 325 Mg Tbec (Aspirin) ..... Once daily    Lantus Solostar 100 Unit/ml Soln (Insulin glargine) .Marland Kitchen... 12  u at bedtime    Cozaar 50 Mg Tabs (Losartan potassium) ..... One by  mouth daily  Problem # 2:  HYPERTENSION (ICD-401.9) Assessment: Deteriorated  His updated medication list for this problem includes:    Tenormin 50 Mg Tabs (Atenolol) .Marland Kitchen... 2 in am, 1 in pm    Hydrochlorothiazide 25 Mg Tabs (Hydrochlorothiazide) .Marland Kitchen... Take 1 tablet by mouth every morning    Cozaar 50 Mg Tabs (Losartan potassium) ..... One by mouth daily  Orders: Venipuncture [id number] Specimen Handling (99000) TLB-BMP (Basic Metabolic Panel-BMET) (99991111) TLB-A1C / Hgb A1C (Glycohemoglobin) (83036-A1C)  Complete Medication List: 1)  Glucotrol 5 Mg  Tabs (Glipizide) .... Take 1 tablet by mouth two times a day 2)  Glucophage 1000 Mg Tabs (Metformin hcl) .... Take 1 tablet by mouth two times a day 3)  Amitriptyline Hcl 25 Mg Tabs (Amitriptyline hcl) .Marland Kitchen.. 1 tab @ bedtime 4)  Viagra 100 Mg Tabs (Sildenafil citrate) .... Uad 5)  Aspirin Ec 325 Mg Tbec (Aspirin) .... Once daily 6)  Lantus Solostar 100 Unit/ml Soln (Insulin glargine) .Marland Kitchen.. 12  u at bedtime 7)  Accu-chek Aviva Strp (Glucose blood) .... Uad 8)  Bd Pen Needle Ultrafine 29g X 12.56mm Misc (Insulin pen needle) .... Use once daily for glucose 9)  Tenormin 50 Mg Tabs (Atenolol) .... 2 in am, 1 in pm 10)  Hydrochlorothiazide 25 Mg Tabs (Hydrochlorothiazide) .... Take 1 tablet by mouth every morning 11)  Cozaar 50 Mg Tabs (Losartan potassium) .... One by mouth daily  Patient Instructions: 1)  check your blood pressure daily in the morning and fax me the report in two weeks at [phone]. 2)  Check a fasting blood sugar daily in the morning.  Continue your medications. 3)  Please schedule a follow-up appointment in 3 months.......1/12 4)  BMP prior to visit, ICD-9: 5)  Hepatic Panel prior to visit, ICD-9: 6)  Lipid Panel prior to visit, ICD-9: 7)  TSH prior to visit, ICD-9: 8)  CBC w/ Diff prior to visit, ICD-9: 9)  Urine-dip prior to visit, ICD-9: 10)  PSA prior to visit, ICD-9: 11)  HbgA1C prior to visit, ICD-9: 12)  Urine Microalbumin prior to visit, ICD-9:

## 2010-06-23 ENCOUNTER — Ambulatory Visit (INDEPENDENT_AMBULATORY_CARE_PROVIDER_SITE_OTHER): Payer: BC Managed Care – PPO | Admitting: Family Medicine

## 2010-06-23 ENCOUNTER — Encounter: Payer: Self-pay | Admitting: Family Medicine

## 2010-06-23 DIAGNOSIS — E1149 Type 2 diabetes mellitus with other diabetic neurological complication: Secondary | ICD-10-CM

## 2010-06-23 DIAGNOSIS — I10 Essential (primary) hypertension: Secondary | ICD-10-CM

## 2010-06-23 DIAGNOSIS — F528 Other sexual dysfunction not due to a substance or known physiological condition: Secondary | ICD-10-CM

## 2010-06-23 DIAGNOSIS — E109 Type 1 diabetes mellitus without complications: Secondary | ICD-10-CM

## 2010-06-23 DIAGNOSIS — K625 Hemorrhage of anus and rectum: Secondary | ICD-10-CM

## 2010-06-23 DIAGNOSIS — G609 Hereditary and idiopathic neuropathy, unspecified: Secondary | ICD-10-CM

## 2010-06-23 MED ORDER — GLUCOSE BLOOD VI STRP: ORAL_STRIP | Status: DC

## 2010-06-23 MED ORDER — AMITRIPTYLINE HCL 25 MG PO TABS: 25.0000 mg | ORAL_TABLET | Freq: Every day | ORAL | Status: DC

## 2010-06-23 MED ORDER — LANTUS SOLOSTAR 100 UNIT/ML ~~LOC~~ SOPN: 12.0000 [IU] | PEN_INJECTOR | Freq: Every day | SUBCUTANEOUS | Status: DC

## 2010-06-23 MED ORDER — HYDROCHLOROTHIAZIDE 25 MG PO TABS: 25.0000 mg | ORAL_TABLET | Freq: Every day | ORAL | Status: DC

## 2010-06-23 MED ORDER — METFORMIN HCL 1000 MG PO TABS: 1000.0000 mg | ORAL_TABLET | Freq: Two times a day (BID) | ORAL | Status: DC

## 2010-06-23 MED ORDER — ATENOLOL 50 MG PO TABS: 50.0000 mg | ORAL_TABLET | Freq: Every day | ORAL | Status: DC

## 2010-06-23 MED ORDER — VARDENAFIL HCL 20 MG PO TABS: 20.0000 mg | ORAL_TABLET | ORAL | Status: AC | PRN

## 2010-06-23 MED ORDER — LOSARTAN POTASSIUM 50 MG PO TABS: 50.0000 mg | ORAL_TABLET | Freq: Every day | ORAL | Status: DC

## 2010-06-23 MED ORDER — GLIPIZIDE 5 MG PO TABS: 5.0000 mg | ORAL_TABLET | Freq: Two times a day (BID) | ORAL | Status: DC

## 2010-06-23 NOTE — Patient Instructions (Signed)
Add the 10 units of Lantus in the morning, continue the 12 units at bedtime.  Check a fasting blood sugar daily nature currently doing, but check a fasting blood sugar prior to evening meal.  Stop the Viagra try the Levitra.  Return in one month for follow-up  Continue other medications as outlined.  Call Dr. Jana Half the podiatrist.  Also, call GI for consultation regarding the rectal bleeding

## 2010-06-23 NOTE — Progress Notes (Signed)
Addended by: Westley Hummer on: 06/23/2010 11:45 AM   Modules accepted: Orders

## 2010-06-23 NOTE — Progress Notes (Signed)
Subjective:    Patient ID: [name] [name], male    DOB: [birth year], 60 y.o.   MRN: [id number]  HPI Comments: [name] is a 60 year old single male, nonsmoker, who comes in today for annual physical examination because of a history of type 1 diabetes, hypertension, peripheral neuropathy, and erectile dysfunction.  His diabetes is treated with Glucotrol 5 mg b.i.d., metformin 1000 mg b.i.d., Lantus 12 units nightly.  His fasting blood sugar in the morning is 70 and 90 however, his A1c is 7.8%.  We discussed very strategies to get his A1c back to normal.  We will splint his Lantus dose and continue the 12 units at bedtime, but at 10 units in the morning.  He will continue to check a fasting blood sugar daily in the morning, but also check a blood sugar prior to his evening meal.  His blood pressure today is 150/90.  He states his blood pressures at home or all normal 135/85 or less.  B. Neuropathy history with Elavil 25 mg nightly  The Viagra 100 mg is not work.  We discussed various strategies.  Will try Levitra.    He recently went to the urgent care Center because of a fecal impaction.  Since that, time.  He's had intermittent bright red rectal bleeding.  His last colonoscopy was about 10 years ago.  Recommend he get a follow GI consult with colonoscopy.  He also needs to go back and see the podiatrist for the fungal infection in his nails.     Review of Systems  Constitutional: Negative.  Negative for fever, chills, diaphoresis, activity change, appetite change, fatigue and unexpected weight change.  HENT: Negative.  Negative for hearing loss, ear pain, nosebleeds, congestion, sore throat, facial swelling, rhinorrhea, sneezing, drooling, mouth sores, trouble swallowing, neck pain, neck stiffness, dental problem, voice change, postnasal drip, sinus pressure, tinnitus and ear discharge.   Eyes: Negative.  Negative for photophobia, pain, discharge, redness, itching and visual disturbance.    Respiratory: Negative.  Negative for apnea, cough, choking, chest tightness, shortness of breath, wheezing and stridor.   Cardiovascular: Negative.  Negative for chest pain, palpitations and leg swelling.  Gastrointestinal: Positive for constipation and blood in stool ([facility] patient recently went to the hospital for a fecal impaction). Negative for nausea, vomiting, abdominal pain, diarrhea, abdominal distention, anal bleeding and rectal pain.  Genitourinary: Negative.  Negative for dysuria, urgency, frequency, hematuria, flank pain, decreased urine volume, discharge, penile swelling, scrotal swelling, enuresis, difficulty urinating, genital sores, penile pain and testicular pain.  Musculoskeletal: Negative.  Negative for myalgias, back pain, joint swelling, arthralgias and gait problem.  Skin: Negative.  Negative for color change, pallor, rash and wound.  Neurological: Negative.  Negative for dizziness, tremors, seizures, syncope, facial asymmetry, speech difficulty, weakness, light-headedness, numbness and headaches.  Hematological: Negative.  Negative for adenopathy. Does not bruise/bleed easily.  Psychiatric/Behavioral: Negative.  Negative for suicidal ideas, hallucinations, behavioral problems, confusion, sleep disturbance, self-injury, dysphoric mood, decreased concentration and agitation. The patient is not nervous/anxious and is not hyperactive.        Objective:   Physical Exam  Constitutional: He is oriented to person, place, and time. He appears well-developed and well-nourished. No distress.  HENT:  Head: Normocephalic and atraumatic.  Right Ear: External ear normal.  Left Ear: External ear normal.  Nose: Nose normal.  Mouth/Throat: Oropharynx is clear and moist. No oropharyngeal exudate.  Eyes: Conjunctivae and EOM are normal. Right eye exhibits no discharge. Left eye exhibits no discharge. No  scleral icterus.  Neck: Normal range of motion. Neck supple. No JVD present. No tracheal  deviation present. No thyromegaly present.  Cardiovascular: Normal rate, regular rhythm, normal heart sounds and intact distal pulses.  Exam reveals no gallop and no friction rub.   No murmur heard. Pulmonary/Chest: Breath sounds normal. No stridor. No respiratory distress. He has no wheezes. He has no rales. He exhibits no tenderness.  Abdominal: Bowel sounds are normal. He exhibits no distension and no mass. There is no tenderness. There is no rebound and no guarding.  Genitourinary: Prostate normal and penis normal. Guaiac positive stool.  Musculoskeletal: Normal range of motion. He exhibits no edema and no tenderness.  Lymphadenopathy:    He has no cervical adenopathy.  Neurological: He is alert and oriented to person, place, and time. He has normal reflexes. He displays normal reflexes. No cranial nerve deficit. He exhibits normal muscle tone. Coordination normal.  Skin: Skin is warm and dry. No rash noted. He is not diaphoretic. No erythema. No pallor.       All 10 toenails show evidence of severe fungal infection.  We will refer him back to Dr. Jana Half the podiatrist  Psychiatric: He has a normal mood and affect. His behavior is normal. Judgment and thought content normal.          Assessment & Plan:  Problem number one diabetes, type I, plan is to add 10 units of Lantus in the morning.  Monitor blood sugar in the a.m., plus add a evening, PC blood sugar.  Return in 4 weeks for follow-up.  Problem number two hypertension.  Continue current medications  Problem number 3 neuropathy.  Continue Elavil 25 nightly  Problem number 4 erectile dysfunction.  Plan DC the Viagra trial of Levitra.  Problem number 5 fungal infection.  Plan refer back to Dr. Jana Half the podiatrist for treatment

## 2010-07-02 ENCOUNTER — Encounter: Payer: Self-pay | Admitting: Gastroenterology

## 2010-07-09 NOTE — Letter (Signed)
Summary: New Patient letter  [name]  [address] [place], [name] [address]   Phone: [phone]  Fax: [phone]       07/02/2010 MRN: [id number]  [facility] [place] Humann [address] [place], [place]  [address]  [place]  Dear Mr. [name] [name],  Welcome to the Gastroenterology Division at [facility].    You are scheduled to see Dr.  Sharlett Iles on 07-24-10 at [facility] on the 3rd floor at [facility], [place] [employer].  We ask that you try to arrive at our office 15 minutes prior to your appointment time to allow for check-in.  We would like you to complete the enclosed self-administered evaluation form prior to your visit and bring it with you on the day of your appointment.  We will review it with you.  Also, please bring a complete list of all your medications or, if you prefer, bring the medication bottles and we will list them.  Please bring your insurance card so that we may make a copy of it.  If your insurance requires a referral to see a specialist, please bring your referral form from your primary care physician.  Co-payments are due at the time of your visit and may be paid by cash, check or credit card.     Your office visit will consist of a consult with your physician (includes a physical exam), any laboratory testing he/she may order, scheduling of any necessary diagnostic testing (e.g. x-ray, ultrasound, CT-scan), and scheduling of a procedure (e.g. Endoscopy, Colonoscopy) if required.  Please allow enough time on your schedule to allow for any/all of these possibilities.    If you cannot keep your appointment, please call [phone] to cancel or reschedule prior to your appointment date.  This allows [name] the opportunity to schedule an appointment for another patient in need of care.  If you do not cancel or reschedule by 5 p.m. the business day prior to your appointment date, you will be charged a $50.00 late cancellation/no-show fee.    Thank you for  choosing [facility] for your medical needs.  We appreciate the opportunity to care for you.  Please visit [name] at our website  to learn more about our practice.                     Sincerely,                                                             The Gastroenterology Division

## 2010-07-24 ENCOUNTER — Encounter: Payer: Self-pay | Admitting: Gastroenterology

## 2010-07-24 ENCOUNTER — Ambulatory Visit: Payer: BC Managed Care – PPO | Admitting: Gastroenterology

## 2010-07-24 ENCOUNTER — Ambulatory Visit (INDEPENDENT_AMBULATORY_CARE_PROVIDER_SITE_OTHER): Payer: BC Managed Care – PPO | Admitting: Gastroenterology

## 2010-07-24 ENCOUNTER — Encounter (INDEPENDENT_AMBULATORY_CARE_PROVIDER_SITE_OTHER): Payer: Self-pay | Admitting: *Deleted

## 2010-07-24 DIAGNOSIS — K5900 Constipation, unspecified: Secondary | ICD-10-CM | POA: Insufficient documentation

## 2010-07-24 DIAGNOSIS — K625 Hemorrhage of anus and rectum: Secondary | ICD-10-CM | POA: Insufficient documentation

## 2010-07-24 DIAGNOSIS — E119 Type 2 diabetes mellitus without complications: Secondary | ICD-10-CM

## 2010-07-29 NOTE — Assessment & Plan Note (Addendum)
Summary: PERSISTENT RECTAL BLEED/SCHED W-NORMA/NO GI HX/MAILER SENT/CX...   History of Present Illness Visit Type: Initial Consult Primary GI MD: Verl Blalock MD [place] Primary Provider: Stevie Kern, MD Requesting Provider: Stevie Kern, MD Chief Complaint: Impaction treated in ER 2 months ago History of Present Illness:   60 year old African American male with insulin dependent diabetes over the last 10 years managed by Dr. Stevie Kern. He currently is having worsening constipation, and apparently had a rectal impaction required manual removal in the emergency room several weeks ago. He continues mild constipation but to me denies rectal bleeding. He currently has the bathroom every several days and does not require laxatives. Review of his labs shows no evidence of anemia or metabolic disturbances. Medications are listed and reviewed as is the recent excellent note from Dr. Sherren Mocha.  There is no history of hepatitis, pancreatitis, or any hepatobiliary symptomatology. Patient's mother did have colonic polyposis. He has not had previous colonoscopy or barium studies. Additional problem with his diabetes include hypertension and peripheral neuropathy. He does not smoke or use ethanol daily.   GI Review of Systems      Denies abdominal pain, acid reflux, belching, bloating, chest pain, dysphagia with liquids, dysphagia with solids, heartburn, loss of appetite, nausea, vomiting, vomiting blood, weight loss, and  weight gain.      Reports constipation, diarrhea, and  hemorrhoids.     Denies anal fissure, black tarry stools, change in bowel habit, diverticulosis, fecal incontinence, heme positive stool, irritable bowel syndrome, jaundice, light color stool, liver problems, rectal bleeding, and  rectal pain.    Current Medications (verified): 1)  Glucotrol 5 Mg  Tabs (Glipizide) .... Take 1 Tablet By Mouth Two Times A Day 2)  Metformin Hcl 500 Mg Tabs (Metformin Hcl) .... Take 1 Tablet By  Mouth Two Times A Day 3)  Amitriptyline Hcl 25 Mg  Tabs (Amitriptyline Hcl) .Marland Kitchen.. 1 Tab @ Bedtime 4)  Viagra 100 Mg  Tabs (Sildenafil Citrate) .... Uad 5)  Aspirin Ec 325 Mg Tbec (Aspirin) .... Once Daily 6)  Lantus Solostar 100 Unit/ml Soln (Insulin Glargine) .Marland Kitchen.. 12  U At Bedtime, 10units  Every Morning 7)  Accu-Chek Aviva  Strp (Glucose Blood) .... Uad 8)  Bd Pen Needle Ultrafine 29g X 12.51mm Misc (Insulin Pen Needle) .... Use Once Daily For Glucose 9)  Tenormin 50 Mg Tabs (Atenolol) .... 2 in Am, 1 in Pm 10)  Hydrochlorothiazide 25 Mg Tabs (Hydrochlorothiazide) .... Take 1 Tablet By Mouth Every Morning 11)  Cozaar 50 Mg Tabs (Losartan Potassium) .... One By Mouth Daily  Allergies (verified): No Known Drug Allergies  Past History:  Past medical, surgical, family and social histories (including risk factors) reviewed for relevance to current acute and chronic problems.  Past Medical History: Reviewed history from 06/12/2008 and no changes required. Diabetes mellitus, type II Hypertension Peripheral neuropathy ED Diabetes mellitus, type I  Past Surgical History: Appendectomy  Family History: Reviewed history from 07/20/2007 and no changes required. Family History Diabetes 1st degree relative Family History Hypertension Family History of Colon Polyps:Mother  Social History: Reviewed history from 06/14/2009 and no changes required. 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  The patient denies anemia, anxiety-new, arthritis/joint pain, back pain, blood in urine, breast changes/lumps, change in vision, confusion, coughing up blood, depression-new, fainting, fatigue, fever, headaches-new, hearing problems, heart murmur, heart rhythm changes, itching,  menstrual pain, muscle pains/cramps, night sweats, nosebleeds, pregnancy symptoms, shortness of breath,  skin rash, sleeping problems, sore throat, swelling of feet/legs, swollen lymph glands, thirst - excessive , urination - excessive , urination changes/pain, urine leakage, vision changes, and voice change.    Vital Signs:  Patient profile:   60 year old male Height:      69 inches Weight:      183.25 pounds BMI:     27.16 Pulse rate:   72 / minute Pulse rhythm:   regular BP sitting:   152 / 86  (left arm) Cuff size:   regular  Vitals Entered By: June McMurray [place] Deborra Medina) (July 24, 2010 9:50 AM)  Physical Exam  General:  Well developed, well nourished, no acute distress. Head:  Normocephalic and atraumatic. Eyes:  PERRLA, no icterus.exam deferred to patient's ophthalmologist.   Neck:  Supple; no masses or thyromegaly. Lungs:  Clear throughout to auscultation. Heart:  Regular rate and rhythm; no murmurs, rubs,  or bruits. Abdomen:  Soft, nontender and nondistended. No masses, hepatosplenomegaly or hernias noted. Normal bowel sounds. Rectal:  Normal exam.hemocult negative.   Prostate:  .normal size prostate.   Msk:  Symmetrical with no gross deformities. Normal posture. Pulses:  Normal pulses noted. Extremities:  No clubbing, cyanosis, edema or deformities noted. Neurologic:  Alert and  oriented x4;  grossly normal neurologically. Cervical Nodes:  No significant cervical adenopathy. Psych:  Alert and cooperative. Normal mood and affect.   Impression & Recommendations:  Problem # 1:  CONSTIPATION (ICD-564.00) Assessment Deteriorated Probable dietary related constipation from lack of fiber in his diet and lack of p.o. fluid intake. I have advised a high fiber diet with liberal p.o. fluids and have also placed him on MiraLax 8 ounces at bedtime to be adjusted as per symptomatology. Colonoscopy has been scheduled at his convenience. Adjustments in his diabetic medications will be made for this procedure. I do not think we need repeat labs at this time.  Problem # 2:  DM W/NEURO  MANIFESTATIONS, TYPE II (ICD-250.60) Assessment: Improved continue multiple medications per primary care with adjustments for colonoscopy procedure.  Problem # 3:  FAMILY HISTORY DIABETES 1ST DEGREE RELATIVE (ICD-V18.0) Assessment: Comment Only  Problem # 4:  HYPERTENSION (ICD-401.9) Assessment: Improved blood pressure slightly elevated at 152/86. He been advised to continue all of his other medications as per primary care.   Patient Instructions: 1)  Copy sent to : Stevie Kern, MD 2)  Your procedure has been scheduled for 08/11/2010, please follow the seperate instructions.  3)  [facility] Patient Information Guide given to patient.  4)  Colonoscopy and Flexible Sigmoidoscopy brochure given.  5)  Your prescription(s) have been sent to you pharmacy.   6)  The medication list was reviewed and reconciled.  All changed / newly prescribed medications were explained.  A complete medication list was provided to the patient / caregiver. 7)  Constipation and Hemorrhoids brochure given.  Prescriptions: MOVIPREP 100 GM  SOLR (PEG-KCL-NACL-NASULF-NA ASC-C) As per prep instructions.  #1 x 0   Entered by:   Bernita Buffy CMA ([place])   Authorized by:   Sable Feil MD [facility]   Signed by:   Bernita Buffy CMA ([place]) on 07/24/2010   Method used:   Electronically to        [facility] (retail)       [address]       [place], [place]  [id number]  Ph: [phone]       Fax: [phone]   [id number]

## 2010-07-29 NOTE — Letter (Signed)
Summary: Diabetic Instructions  [facility]  [name], [name] [address]   Phone: [phone]  Fax: [phone]    [name] [name] 06/09/1950 MRN: [id number]      X   ORAL DIABETIC MEDICATION INSTRUCTIONS  The day before your procedure:   Take your diabetic pill as you do normally  The day of your procedure:   Do not take your diabetic pill    We will check your blood sugar levels during the admission process and again in Recovery before discharging you home  ________________________________________________________________________  X   INSULIN (LONG ACTING) MEDICATION INSTRUCTIONS (Lantus, NPH, 70/30, Humulin, Novolin-N)   The day before your procedure:   Take  your regular evening dose    The day of your procedure:   Do not take your morning dose

## 2010-07-29 NOTE — Letter (Signed)
Summary: [facility] Instructions  [facility]  [place], [place] [address]   Phone: [phone]  Fax: [phone]       [name] [name]    Oct 15, 1950    MRN: [id number]        Procedure Day /Date: Monday 08/11/2010     Arrival Time: 8:00am     Procedure Time: 9:00am     Location of Procedure:                    X  [facility] (4th Floor)                        [place]   Starting 5 days prior to your procedure 08/06/2010 do not eat nuts, seeds, popcorn, corn, beans, peas,  salads, or any raw vegetables.  Do not take any fiber supplements (e.g. Metamucil, Citrucel, and Benefiber).  THE DAY BEFORE YOUR PROCEDURE         Sunday 08/10/2010  1.  Drink clear liquids the entire day-NO SOLID FOOD  2.  Do not drink anything colored red or purple.  Avoid juices with pulp.  No orange juice.  3.  Drink at least 64 oz. (8 glasses) of fluid/clear liquids during the day to prevent dehydration and help the prep work efficiently.  CLEAR LIQUIDS INCLUDE: Water Jello Ice Popsicles Tea (sugar ok, no milk/cream) Powdered fruit flavored drinks Coffee (sugar ok, no milk/cream) Gatorade Juice: apple, white grape, white cranberry  Lemonade Clear bullion, consomm, broth Carbonated beverages (any kind) Strained chicken noodle soup Hard Candy                             4.  In the morning, mix first dose of MoviPrep solution:    Empty 1 Pouch A and 1 Pouch B into the disposable container    Add lukewarm drinking water to the top line of the container. Mix to dissolve    Refrigerate (mixed solution should be used within 24 hrs)  5.  Begin drinking the prep at 5:00 p.m. The MoviPrep container is divided by 4 marks.   Every 15 minutes drink the solution down to the next mark (approximately 8 oz) until the full liter is complete.   6.  Follow completed prep with 16 oz of clear liquid of your choice (Nothing red or  purple).  Continue to drink clear liquids until bedtime.  7.  Before going to bed, mix second dose of MoviPrep solution:    Empty 1 Pouch A and 1 Pouch B into the disposable container    Add lukewarm drinking water to the top line of the container. Mix to dissolve    Refrigerate  THE DAY OF YOUR PROCEDURE      Monday 08/11/2010  Beginning at 4:00am (5 hours before procedure):         1. Every 15 minutes, drink the solution down to the next mark (approx 8 oz) until the full liter is complete.  2. Follow completed prep with 16 oz. of clear liquid of your choice.    3. You may drink clear liquids until 7:00am (2 HOURS BEFORE PROCEDURE).   MEDICATION INSTRUCTIONS  Unless otherwise instructed, you should take regular prescription medications with a small sip of water   as early as possible the morning of your procedure.  Diabetic patients - see separate instructions.  OTHER INSTRUCTIONS  You will need a responsible adult at least 62 years of age to accompany you and drive you home.   This person must remain in the waiting room during your procedure.  Wear loose fitting clothing that is easily removed.  Leave jewelry and other valuables at home.  However, you may wish to bring a book to read or  an iPod/MP3 player to listen to music as you wait for your procedure to start.  Remove all body piercing jewelry and leave at home.  Total time from sign-in until discharge is approximately 2-3 hours.  You should go home directly after your procedure and rest.  You can resume normal activities the  day after your procedure.  The day of your procedure you should not:   Drive   Make legal decisions   Operate machinery   Drink alcohol   Return to work  You will receive specific instructions about eating, activities and medications before you leave.    The above instructions have been reviewed and explained to me by   _______________________    I fully  understand and can verbalize these instructions _____________________________ Date _________

## 2010-07-31 ENCOUNTER — Encounter: Payer: Self-pay | Admitting: Family Medicine

## 2010-07-31 ENCOUNTER — Ambulatory Visit (INDEPENDENT_AMBULATORY_CARE_PROVIDER_SITE_OTHER): Payer: BC Managed Care – PPO | Admitting: Family Medicine

## 2010-07-31 DIAGNOSIS — E109 Type 1 diabetes mellitus without complications: Secondary | ICD-10-CM

## 2010-07-31 DIAGNOSIS — I10 Essential (primary) hypertension: Secondary | ICD-10-CM

## 2010-07-31 MED ORDER — LOSARTAN POTASSIUM 100 MG PO TABS: 100.0000 mg | ORAL_TABLET | Freq: Every day | ORAL | Status: DC

## 2010-07-31 NOTE — Progress Notes (Signed)
  Subjective:    Patient ID: [name] [name], male    DOB: [birth year], 60 y.o.   MRN: [id number]  HPI [name] [name] is a type I diabetic, who comes in today for follow-up of his diabetes and hypertension.  And it is previously he was on oral agents however, his blood sugar is not controlled.  Therefore, we started him on insulin.  His current dose is 12 units of Lantus in the morning and 10 units at bedtime.  His fasting blood sugars at home range between 80 and 100.  He currently taking Tenormin 50 mg daily, and her thiazide 25 mg daily, losartan 50 mg daily for hypertension, however, BP at home is running [id number] to [id number] systolic, 85 to 90 diastolic.   Review of Systems    Otherwise negative Objective:   Physical Exam    Well-developed well-nourished, male in no acute distress    Assessment & Plan:  Diabetes type 1, under excellent control continue above therapy.  Hypertension not at goal increase losartan to 100 mg daily BP checked daily.  Follow-up in 4 weeks if blood pressure not at goal.  Otherwise, follow-up in 3 months

## 2010-07-31 NOTE — Patient Instructions (Signed)
Continue current treatment program through diabetes follow-up.  A1c in 3 months.  Increased losartan  to 100 mg daily check y  blood pressure daily if in 4 weeks y  blood pressure is not at goal......Marland Kitchen 130/80, or less.......Marland Kitchen Return to see me for follow-up.  If it isat goal , and follow-up with your blood sugar in 3 months

## 2010-08-01 ENCOUNTER — Ambulatory Visit: Payer: BC Managed Care – PPO | Admitting: Family Medicine

## 2010-08-08 ENCOUNTER — Encounter: Payer: Self-pay | Admitting: Gastroenterology

## 2010-08-11 ENCOUNTER — Ambulatory Visit (AMBULATORY_SURGERY_CENTER): Payer: BC Managed Care – PPO | Admitting: Gastroenterology

## 2010-08-11 ENCOUNTER — Encounter: Payer: Self-pay | Admitting: Gastroenterology

## 2010-08-11 VITALS — BP 153/81 | HR 52 | Temp 96.3°F | Resp 12 | Ht 70.0 in | Wt 182.0 lb

## 2010-08-11 DIAGNOSIS — Z1211 Encounter for screening for malignant neoplasm of colon: Secondary | ICD-10-CM

## 2010-08-11 DIAGNOSIS — K5900 Constipation, unspecified: Secondary | ICD-10-CM

## 2010-08-11 DIAGNOSIS — Z800 Family history of malignant neoplasm of digestive organs: Secondary | ICD-10-CM

## 2010-08-11 LAB — GLUCOSE, CAPILLARY: Glucose-Capillary: 62 mg/dL — ABNORMAL LOW (ref 70–99)

## 2010-08-11 NOTE — Progress Notes (Deleted)
Subjective:     Patient ID: [name] [name], male   DOB: [birth year], 60 y.o.   MRN: [id number]  HPI   Review of Systems     Objective:   Physical Exam     Assessment:     ***    Plan:     ***

## 2010-08-11 NOTE — Progress Notes (Signed)
   Past Administering  sedation iv puffed, iv dc and new iv started by Silver Huguenin RN  Deleted by: Margie Ege, RN [08/11/2010 9:54 AM]

## 2010-08-11 NOTE — Patient Instructions (Signed)
Normal Exam  Recommendations:  High Fiber Diet                                   Metamucil or benefiber                                   Colonoscopy screening in 10 Years  Pt discharge instructions given and explained to pt and caregiver.

## 2010-08-11 NOTE — Progress Notes (Signed)
  Subjective:    Patient ID: AMI KUMAGAI, male    DOB: 1951-04-13, 60 y.o.   MRN: [id number]  HPI    Review of Systems     Objective:   Physical Exam        Assessment & Plan:

## 2010-08-11 NOTE — Progress Notes (Deleted)
Past  Administering sedation iv puffed, iv dc and new iv started by Silver Huguenin RN

## 2010-08-12 ENCOUNTER — Telehealth: Payer: Self-pay | Admitting: *Deleted

## 2010-08-12 LAB — GLUCOSE, CAPILLARY: Glucose-Capillary: 77 mg/dL (ref 70–99)

## 2010-08-12 NOTE — Telephone Encounter (Signed)
Left mess. that we called to follow up with pt.

## 2010-08-14 ENCOUNTER — Encounter: Payer: Self-pay | Admitting: Family Medicine

## 2010-08-19 NOTE — Procedures (Signed)
Summary: Colonoscopy  Patient: [name] [name] Note: All result statuses are Final unless otherwise noted.  Tests: (1) Colonoscopy (COL)   COL Colonoscopy           [facility]     [place], [place]  [id number]          COLONOSCOPY PROCEDURE REPORT          PATIENT:  [name] [name], [name] [name]  MR#:  [id number]     BIRTHDATE:  June 20, 1950, 59 yrs. old  GENDER:  male     ENDOSCOPIST:  Loralee Pacas. Sharlett Iles, MD, [facility]     REF. BY:  Dellis Filbert A. Sherren Mocha, M.D.     PROCEDURE DATE:  08/11/2010     PROCEDURE:  Higher-risk screening colonoscopy G0105          ASA CLASS:  Class III     INDICATIONS:  family history of colon cancer, constipation     MEDICATIONS:   Fentanyl 50 mcg IV, Versed 4 mg IV          DESCRIPTION OF PROCEDURE:   After the risks benefits and     alternatives of the procedure were thoroughly explained, informed     consent was obtained.  Digital rectal exam was performed and     revealed no abnormalities.   The LB [id number] endoscope     was introduced through the anus and advanced to the cecum, which     was identified by both the appendix and ileocecal valve, without     limitations.  The quality of the prep was good, using MoviPrep.     The instrument was then slowly withdrawn as the colon was fully     examined.     <<PROCEDUREIMAGES>>          FINDINGS:  No polyps or cancers were seen.  This was otherwise a     normal examination of the colon.   Retroflexed views in the rectum     revealed no abnormalities.    The scope was then withdrawn from     the patient and the procedure completed.          COMPLICATIONS:  None     ENDOSCOPIC IMPRESSION:     1) No polyps or cancers     2) Otherwise normal examination     RECOMMENDATIONS:     1) high fiber diet     2) metamucil or benefiber     3) Continue current colorectal screening recommendations for     "routine risk" patients with a repeat colonoscopy in 10 years.     REPEAT EXAM:   No          ______________________________     Loralee Pacas. Sharlett Iles, MD, [facility]          CC:          n.     eSIGNED:   Loralee Pacas. Devarion Mcclanahan at 08/11/2010 10:13 AM          Enzo Montgomery, [id number]  Note: An exclamation mark (!) indicates a result that was not dispersed into the flowsheet. Document Creation Date: 08/11/2010 10:13 AM _______________________________________________________________________  (1) Order result status: Final Collection or observation date-time: 08/11/2010 10:06 Requested date-time:  Receipt date-time:  Reported date-time:  Referring Physician:   Ordering Physician: Verl Blalock [phone]) Specimen Source:  Source: Tawanna Cooler Order Number: [id number] Lab site:

## 2010-10-16 ENCOUNTER — Other Ambulatory Visit (INDEPENDENT_AMBULATORY_CARE_PROVIDER_SITE_OTHER): Payer: BC Managed Care – PPO

## 2010-10-16 DIAGNOSIS — E119 Type 2 diabetes mellitus without complications: Secondary | ICD-10-CM

## 2010-10-16 LAB — HEMOGLOBIN A1C: Hgb A1c MFr Bld: 7.4 % — ABNORMAL HIGH (ref 4.6–6.5)

## 2010-10-16 LAB — BASIC METABOLIC PANEL
BUN: 14 mg/dL (ref 6–23)
CO2: 29 mEq/L (ref 19–32)
Chloride: 108 mEq/L (ref 96–112)
Creatinine, Ser: 1 mg/dL (ref 0.4–1.5)
Glucose, Bld: 103 mg/dL — ABNORMAL HIGH (ref 70–99)
Potassium: 4 mEq/L (ref 3.5–5.1)

## 2010-10-30 ENCOUNTER — Ambulatory Visit: Payer: BC Managed Care – PPO | Admitting: Family Medicine

## 2010-11-04 ENCOUNTER — Encounter: Payer: Self-pay | Admitting: Family Medicine

## 2010-11-04 ENCOUNTER — Ambulatory Visit (INDEPENDENT_AMBULATORY_CARE_PROVIDER_SITE_OTHER): Payer: BC Managed Care – PPO | Admitting: Family Medicine

## 2010-11-04 VITALS — BP 150/88 | Temp 98.2°F | Wt 185.0 lb

## 2010-11-04 DIAGNOSIS — E109 Type 1 diabetes mellitus without complications: Secondary | ICD-10-CM

## 2010-11-04 MED ORDER — GLIPIZIDE 10 MG PO TABS: ORAL_TABLET | ORAL | Status: DC

## 2010-11-04 NOTE — Patient Instructions (Signed)
Continue the metformin 1000 mg twice daily increase the glipizide and take 10 mg prior to breakfast and keep your evening.  This the same at 5 mg.  Continue your diet and exercise program.  Follow-up in 3 months.  Labs one week prior

## 2010-11-04 NOTE — Progress Notes (Signed)
  Subjective:    Patient ID: [name] [name], male    DOB: [birth year], 60 y.o.   MRN: [id number]  HPIWillie is a 60 year old male, nonsmoker, who comes in today for follow-up of diabetes, type II.  Is on metformin 1000 mg b.i.d. And Glucotrol 5 b.i.d. However, blood sugar is now down to 103.  However, A1c only dropped from 7874.  He's been more compliant with his medication now is taking it daily when the past.  He was not taking it on a regular basis.  He sat a diabetic eye exam for over two years referred to Dr. Lennie [name] ophthalmologist   Review of Systems General and metabolic review of systems otherwise negative    Objective:   Physical Exam    Well-developed well-nourished, male in no acute distress    Assessment & Plan:  Diabetes type 2, in improved control, but not at goal.  Plan increase Glucotrol to 10 mg in the morning continue the 5 mg prior to evening meal and continue metformin 1000 b.i.d. A1c and follow-up in 3 months

## 2011-01-27 ENCOUNTER — Other Ambulatory Visit (INDEPENDENT_AMBULATORY_CARE_PROVIDER_SITE_OTHER): Payer: BC Managed Care – PPO

## 2011-01-27 DIAGNOSIS — E109 Type 1 diabetes mellitus without complications: Secondary | ICD-10-CM

## 2011-01-27 LAB — BASIC METABOLIC PANEL
BUN: 19 mg/dL (ref 6–23)
Chloride: 101 mEq/L (ref 96–112)
Glucose, Bld: 167 mg/dL — ABNORMAL HIGH (ref 70–99)
Potassium: 5.1 mEq/L (ref 3.5–5.1)

## 2011-02-04 ENCOUNTER — Ambulatory Visit: Payer: BC Managed Care – PPO | Admitting: Family Medicine

## 2011-02-05 ENCOUNTER — Encounter: Payer: Self-pay | Admitting: Family Medicine

## 2011-02-05 ENCOUNTER — Ambulatory Visit (INDEPENDENT_AMBULATORY_CARE_PROVIDER_SITE_OTHER): Payer: BC Managed Care – PPO | Admitting: Family Medicine

## 2011-02-05 VITALS — BP 144/84 | Temp 98.2°F | Wt 185.0 lb

## 2011-02-05 DIAGNOSIS — E109 Type 1 diabetes mellitus without complications: Secondary | ICD-10-CM

## 2011-02-05 NOTE — Progress Notes (Signed)
  Subjective:    Patient ID: [name] [name], male    DOB: 01/16/1951, 60 y.o.   MRN: [id number]  HPI [name] [name] is a 60 year old male, nonsmoker, type I diabetic, who comes in today for follow-up.  His blood sugar despite his best efforts is up in the 167 range and A1c has gone from 7.4% 8.4%.  His weight is steady at 185.  He follows his diet and he takes His oral medicine as directed.   Review of Systems    General and metabolic review of systems otherwise negative Objective:   Physical Exam Well-developed well-nourished, male in no acute distress       Assessment & Plan:  Diabetes type I.  Increase insulin from 10 units in the evening and to 12 units in the morning to 15 units twice daily.  Follow-up physical examination January 2013 sooner if blood sugar does not drop to normal

## 2011-02-05 NOTE — Patient Instructions (Signed)
Increase the insulin to 15 units twice daily.  Check a fasting blood sugar once daily in the morning.  If you fasting blood sugar  Stays  below140 .............continue the 15 units twice daily  Follow-up annual exam January 2013 Center for any problems

## 2011-06-18 ENCOUNTER — Other Ambulatory Visit (INDEPENDENT_AMBULATORY_CARE_PROVIDER_SITE_OTHER): Payer: BC Managed Care – PPO

## 2011-06-18 DIAGNOSIS — Z0000 Encounter for general adult medical examination without abnormal findings: Secondary | ICD-10-CM

## 2011-06-18 LAB — POCT URINALYSIS DIPSTICK
Ketones, UA: NEGATIVE
Urobilinogen, UA: 0.2

## 2011-06-18 LAB — HEPATIC FUNCTION PANEL
ALT: 17 U/L (ref 0–53)
AST: 18 U/L (ref 0–37)
Albumin: 4 g/dL (ref 3.5–5.2)
Alkaline Phosphatase: 51 U/L (ref 39–117)

## 2011-06-18 LAB — BASIC METABOLIC PANEL
Calcium: 9.6 mg/dL (ref 8.4–10.5)
GFR: 81.93 mL/min (ref >60.00)
Potassium: 4.5 mEq/L (ref 3.5–5.1)
Sodium: 140 mEq/L (ref 135–145)

## 2011-06-18 LAB — CBC WITH DIFFERENTIAL/PLATELET
Basophils Absolute: 0 10*3/uL (ref 0.0–0.1)
Basophils Relative: 0.7 % (ref 0.0–3.0)
Eosinophils Relative: 1.3 % (ref 0.0–5.0)
HCT: 39.8 % (ref 39.0–52.0)
Hemoglobin: 13.6 g/dL (ref 13.0–17.0)
Lymphocytes Relative: 22.8 % (ref 12.0–46.0)
Lymphs Abs: 1.3 10*3/uL (ref 0.7–4.0)
Monocytes Relative: 9.5 % (ref 3.0–12.0)
Neutro Abs: 3.8 10*3/uL (ref 1.4–7.7)
RBC: 4.47 Mil/uL (ref 4.22–5.81)
RDW: 12.2 % (ref 11.5–14.6)
WBC: 5.7 10*3/uL (ref 4.5–10.5)

## 2011-06-18 LAB — PSA: PSA: 6.26 ng/mL — ABNORMAL HIGH (ref 0.10–4.00)

## 2011-06-18 LAB — TSH: TSH: 1.61 u[IU]/mL (ref 0.35–5.50)

## 2011-06-18 LAB — MICROALBUMIN / CREATININE URINE RATIO
Microalb Creat Ratio: 10.5 mg/g (ref 0.0–30.0)
Microalb, Ur: 18.8 mg/dL — ABNORMAL HIGH (ref 0.0–1.9)

## 2011-06-18 LAB — LIPID PANEL: HDL: 46.1 mg/dL (ref >39.00)

## 2011-06-25 ENCOUNTER — Telehealth: Payer: Self-pay | Admitting: Family Medicine

## 2011-06-25 ENCOUNTER — Encounter: Payer: Self-pay | Admitting: Family Medicine

## 2011-06-25 ENCOUNTER — Ambulatory Visit (INDEPENDENT_AMBULATORY_CARE_PROVIDER_SITE_OTHER): Payer: BC Managed Care – PPO | Admitting: Family Medicine

## 2011-06-25 DIAGNOSIS — R972 Elevated prostate specific antigen [PSA]: Secondary | ICD-10-CM

## 2011-06-25 DIAGNOSIS — I10 Essential (primary) hypertension: Secondary | ICD-10-CM

## 2011-06-25 DIAGNOSIS — N400 Enlarged prostate without lower urinary tract symptoms: Secondary | ICD-10-CM

## 2011-06-25 DIAGNOSIS — Z23 Encounter for immunization: Secondary | ICD-10-CM

## 2011-06-25 DIAGNOSIS — B351 Tinea unguium: Secondary | ICD-10-CM

## 2011-06-25 DIAGNOSIS — E109 Type 1 diabetes mellitus without complications: Secondary | ICD-10-CM

## 2011-06-25 DIAGNOSIS — R05 Cough: Secondary | ICD-10-CM

## 2011-06-25 DIAGNOSIS — G609 Hereditary and idiopathic neuropathy, unspecified: Secondary | ICD-10-CM

## 2011-06-25 DIAGNOSIS — R059 Cough, unspecified: Secondary | ICD-10-CM

## 2011-06-25 MED ORDER — LOSARTAN POTASSIUM 100 MG PO TABS: 100.0000 mg | ORAL_TABLET | Freq: Every day | ORAL | Status: DC

## 2011-06-25 MED ORDER — ATENOLOL 50 MG PO TABS: ORAL_TABLET | ORAL | Status: DC

## 2011-06-25 MED ORDER — HYDROCHLOROTHIAZIDE 25 MG PO TABS: 25.0000 mg | ORAL_TABLET | Freq: Every day | ORAL | Status: DC

## 2011-06-25 MED ORDER — HYDROCORTISONE 2.5 % RE CREA: TOPICAL_CREAM | Freq: Every day | RECTAL | Status: AC

## 2011-06-25 MED ORDER — AMITRIPTYLINE HCL 25 MG PO TABS: 25.0000 mg | ORAL_TABLET | Freq: Every day | ORAL | Status: DC

## 2011-06-25 MED ORDER — METFORMIN HCL 1000 MG PO TABS: 1000.0000 mg | ORAL_TABLET | Freq: Two times a day (BID) | ORAL | Status: DC

## 2011-06-25 MED ORDER — GLIPIZIDE 10 MG PO TABS: ORAL_TABLET | ORAL | Status: DC

## 2011-06-25 MED ORDER — ATENOLOL 50 MG PO TABS: 50.0000 mg | ORAL_TABLET | Freq: Every day | ORAL | Status: DC

## 2011-06-25 MED ORDER — LANTUS SOLOSTAR 100 UNIT/ML ~~LOC~~ SOPN: PEN_INJECTOR | SUBCUTANEOUS | Status: DC

## 2011-06-25 NOTE — Patient Instructions (Signed)
Increase the Glucotrol to 10 mg twice daily  Continue the metformin 1000 mg twice daily  Increase the insulin to 15 units twice daily  Fasting blood sugar daily in the morning and return in one week for followup will  Soak and file your nails weekly  Call Dr. Darleen Crocker ophthalmologists to set up routine eye exam

## 2011-06-25 NOTE — Progress Notes (Signed)
Addended by: Westley Hummer B on: 06/25/2011 10:55 AM   Modules accepted: Orders

## 2011-06-25 NOTE — Telephone Encounter (Signed)
Please contact to clarify atenolol script.

## 2011-06-25 NOTE — Progress Notes (Signed)
Subjective:    Patient ID: [name] [name], male    DOB: 27-Jan-1951, 61 y.o.   MRN: [id number]  HPI [name] [name] is a 61 year old married male nonsmoker who comes in today for general physical examination because of a history of type 1 diabetes, hypertension, peripheral neuropathy,  He takes metformin 1000 mg twice a day and Glucotrol 10 mg in the morning and 5 mg prior to his evening male and Lantus 12 units at bedtime and 10 units in the morning. His blood sugar now is in the 169 range with an A1c of 9.0%. His weight is 178 pounds he walks 4-5 miles per day.  His blood pressure is 162/80 on Tenormin 100 mg in the morning and 50 mg at bedtime and low Sartin 100 mg daily and hydrochlorothiazide 25 mg daily.  He takes Elavil 25 mg each bedtime for neuropathy and an aspirin tablet  All 10 toenails are thick and infected with fungus. I spent 15 minutes trimming and filing his toenails  He does not get routine eye care refer to Dr. Octavia Bruckner b for diabetic eye check, hearing normal, irregular dental care, colonoscopy last year normal,,,,,,,,,, however for 3 weeks he's had an external hemorrhoid that is occasionally bleeding but mostly irritated.  Seasonal flu shot today, Pneumovax x2, tetanus 2004.   Review of Systems  Constitutional: Negative.   HENT: Negative.   Eyes: Negative.   Respiratory: Negative.   Cardiovascular: Negative.   Gastrointestinal: Negative.   Genitourinary: Negative.   Musculoskeletal: Negative.   Skin: Negative.   Neurological: Negative.   Hematological: Negative.   Psychiatric/Behavioral: Negative.        Objective:   Physical Exam  Constitutional: He is oriented to person, place, and time. He appears well-developed and well-nourished.  HENT:  Head: Normocephalic and atraumatic.  Right Ear: External ear normal.  Left Ear: External ear normal.  Nose: Nose normal.  Mouth/Throat: Oropharynx is clear and moist.  Eyes: Conjunctivae and EOM are normal. Pupils are  equal, round, and reactive to light.  Neck: Normal range of motion. Neck supple. No JVD present. No tracheal deviation present. No thyromegaly present.  Cardiovascular: Normal rate, regular rhythm, normal heart sounds and intact distal pulses.  Exam reveals no gallop and no friction rub.   No murmur heard. Pulmonary/Chest: Effort normal and breath sounds normal. No stridor. No respiratory distress. He has no wheezes. He has no rales. He exhibits no tenderness.  Abdominal: Soft. Bowel sounds are normal. He exhibits no distension and no mass. There is no tenderness. There is no rebound and no guarding.  Genitourinary: Rectum normal, prostate normal and penis normal. Guaiac negative stool. No penile tenderness.       There is a marble-sized external hemorrhoid at the 9:00 position all  Musculoskeletal: Normal range of motion. He exhibits no edema and no tenderness.  Lymphadenopathy:    He has no cervical adenopathy.  Neurological: He is alert and oriented to person, place, and time. He has normal reflexes. No cranial nerve deficit. He exhibits normal muscle tone.  Skin: Skin is warm and dry. No rash noted. No erythema. No pallor.       Total body skin exam negative except for all 10 toenails are thick with fungus. I spent 15 minutes trimming and filing his toenails  Psychiatric: He has a normal mood and affect. His behavior is normal. Judgment and thought content normal.          Assessment & Plan:  Healthy male  Diabetes  type 1 not at goal continue oral medications increase insulin slowly over the next couple weeks to normalize blood sugar.  Neuropathy continue Elavil  Hypertension not at goal we'll wait until we get his blood sugar back to normal before changing his antihypertensive medication.  In the past he's been intolerant of statins

## 2011-07-02 ENCOUNTER — Encounter: Payer: Self-pay | Admitting: Family Medicine

## 2011-07-02 ENCOUNTER — Ambulatory Visit (INDEPENDENT_AMBULATORY_CARE_PROVIDER_SITE_OTHER): Payer: BC Managed Care – PPO | Admitting: Family Medicine

## 2011-07-02 VITALS — BP 140/90 | Temp 98.2°F | Wt 179.0 lb

## 2011-07-02 DIAGNOSIS — E109 Type 1 diabetes mellitus without complications: Secondary | ICD-10-CM

## 2011-07-02 NOTE — Patient Instructions (Signed)
Continue your current medications  Fasting blood sugar daily in the morning  Return in one month

## 2011-07-02 NOTE — Progress Notes (Signed)
  Subjective:    Patient ID: [name] [name], male    DOB: 07/02/1950, 61 y.o.   MRN: [id number]  HPI [name] [name] is a 61 year old married male nonsmoker who comes in today for followup of type 1 diabetes  He is on max Glucotrol 10 mg twice a day and metformin 1000 mg twice a day. We have been increasing his insulin slowly he's now up to 15 units of Lantus twice daily  Blood sugars range from 50-200 the average about 1:30.   Review of Systems    general and metabolic review of systems otherwise negative Objective:   Physical Exam  Well-developed well-nourished male in no acute distress      Assessment & Plan:  Diabetes type 1 approaching goal plan continue current therapy followup in one month fasting blood sugar daily in the morning

## 2011-07-30 ENCOUNTER — Ambulatory Visit (INDEPENDENT_AMBULATORY_CARE_PROVIDER_SITE_OTHER): Payer: BC Managed Care – PPO | Admitting: Family Medicine

## 2011-07-30 ENCOUNTER — Encounter: Payer: Self-pay | Admitting: Family Medicine

## 2011-07-30 VITALS — BP 180/100 | Temp 98.3°F | Wt 186.0 lb

## 2011-07-30 DIAGNOSIS — E109 Type 1 diabetes mellitus without complications: Secondary | ICD-10-CM

## 2011-07-30 DIAGNOSIS — I499 Cardiac arrhythmia, unspecified: Secondary | ICD-10-CM

## 2011-07-30 DIAGNOSIS — I10 Essential (primary) hypertension: Secondary | ICD-10-CM

## 2011-07-30 NOTE — Patient Instructions (Signed)
Continue your current therapy except decrease the insulin to 10 units twice daily  Increase the Tenormin continue 2 tabs in the morning take 2 tabs at bedtime  Blood pressure check daily in the morning  Blood sugar check also daily in the morning return in 2 weeks for followup with your blood pressure and blood sugar data

## 2011-07-30 NOTE — Progress Notes (Signed)
  Subjective:    Patient ID: [name] [name], male    DOB: 04/16/1951, 61 y.o.   MRN: [id number]  HPI [name] [name] he is a 61 year old married male nonsmoker who comes in today for followup of diabetes type 1 and hypertension  His blood sugar is under excellent control in fact his sugars in the morning or actually too low. They tend to be in the 60-70 range. One was low at 46 and he was asymptomatic  BP today right arm sitting position 180/100 pulse rapid EKG shows  a rapid heart rate otherwise within normal limits   Review of Systems General and cardiovascular he is systems otherwise negative    Objective:   Physical Exam  Well-developed well-nourished male in no acute distress HEENT negative cardiac exam shows rapid heart rate EKG shows rate to be 100 but regular no A. fib BP right arm sitting position 180/100      Assessment & Plan:  Diabetes type 1 at goal continue current therapy  Hypertension not at goal increase beta blocker EP check daily followup in 2 weeks

## 2011-08-13 ENCOUNTER — Encounter: Payer: Self-pay | Admitting: Family Medicine

## 2011-08-13 ENCOUNTER — Ambulatory Visit (INDEPENDENT_AMBULATORY_CARE_PROVIDER_SITE_OTHER): Payer: BC Managed Care – PPO | Admitting: Family Medicine

## 2011-08-13 VITALS — BP 160/98 | Temp 98.5°F | Wt 182.0 lb

## 2011-08-13 DIAGNOSIS — I10 Essential (primary) hypertension: Secondary | ICD-10-CM

## 2011-08-13 MED ORDER — AMLODIPINE BESYLATE 5 MG PO TABS: 5.0000 mg | ORAL_TABLET | Freq: Every day | ORAL | Status: DC

## 2011-08-13 NOTE — Progress Notes (Signed)
  Subjective:    Patient ID: [name] [name], male    DOB: [birth year], 61 y.o.   MRN: [id number]  HPI [name] [name] is a 61 year old male who comes in today for followup of hypertension  He's currently on atenolol 100 mg twice a day, hydrochlorothiazide 25 mg daily, Cozaar 100 mg daily BP down but not normal. Pulse 70 and regular    Review of Systems    no side effects from change in medication Objective:   Physical Exam Well-developed well-nourished man no acute distress BP right arm sitting position 160/100       Assessment & Plan:  Hypertension not at goal plan add Norvasc 5 mg daily

## 2011-08-13 NOTE — Patient Instructions (Signed)
Continue your current medication  Add Norvasc 5 mg...........Marland Kitchen 1 tablet daily in the morning  BP check daily in the morning  Return in 3 weeks for followup

## 2011-09-03 ENCOUNTER — Ambulatory Visit: Payer: BC Managed Care – PPO | Admitting: Family Medicine

## 2011-09-22 ENCOUNTER — Ambulatory Visit: Payer: BC Managed Care – PPO | Admitting: Family Medicine

## 2011-10-14 ENCOUNTER — Encounter: Payer: Self-pay | Admitting: Family Medicine

## 2011-10-14 ENCOUNTER — Ambulatory Visit (INDEPENDENT_AMBULATORY_CARE_PROVIDER_SITE_OTHER): Payer: BC Managed Care – PPO | Admitting: Family Medicine

## 2011-10-14 VITALS — BP 160/90 | Temp 98.1°F | Wt 179.0 lb

## 2011-10-14 DIAGNOSIS — E109 Type 1 diabetes mellitus without complications: Secondary | ICD-10-CM

## 2011-10-14 DIAGNOSIS — I10 Essential (primary) hypertension: Secondary | ICD-10-CM

## 2011-10-14 MED ORDER — AMLODIPINE BESYLATE 10 MG PO TABS: 10.0000 mg | ORAL_TABLET | Freq: Every day | ORAL | Status: DC

## 2011-10-14 NOTE — Patient Instructions (Signed)
Change your Lantus takes 15 units once daily in the morning  Increase the Norvasc to 10 mg daily in the morning  Check a fasting blood sugar and blood pressure daily in the morning and return in 2 weeks for followup

## 2011-10-14 NOTE — Progress Notes (Signed)
  Subjective:    Patient ID: FRIEND [name] [name], male    DOB: [birth year], 61 y.o.   MRN: [id number]  HPI [name] [name] is a 61 year old male nonsmoker who comes in today for followup of diabetes type 1 and hypertension  He's currently taking 12 units twice a day of Lantus however he is developing sugars in the 69s and 60s. We'll switch to once daily and continue oral meds  BP is 160/90 he is compliant with his medication    Review of Systems General and metabolic cardiovascular review of systems otherwise negative    Objective:   Physical Exam Well-developed well-nourished man in acute distress BP right arm sitting position 160/90       Assessment & Plan:    Diabetes type 1 with episodes of hypoglycemia plan change Lantus to 15 units once daily in the morning          fasting blood sugar daily followup in 3 weeks  Hypertension not at goal increase Norvasc to 10 mg daily continue other meds followup

## 2011-10-29 ENCOUNTER — Encounter: Payer: Self-pay | Admitting: Family Medicine

## 2011-10-29 ENCOUNTER — Ambulatory Visit (INDEPENDENT_AMBULATORY_CARE_PROVIDER_SITE_OTHER): Payer: BC Managed Care – PPO | Admitting: Family Medicine

## 2011-10-29 VITALS — BP 130/80 | Temp 98.3°F | Wt 180.0 lb

## 2011-10-29 DIAGNOSIS — I10 Essential (primary) hypertension: Secondary | ICD-10-CM

## 2011-10-29 NOTE — Progress Notes (Signed)
  Subjective:    Patient ID: [name] [name], male    DOB: 04/24/1951, 61 y.o.   MRN: [id number]  HPI Derl is an 61 year old male nonsmoker who comes in today for follow up of hypertension  We had to increase his medication 3 weeks ago because his blood pressure was elevated. Pounds blood pressure is 130/80 no side effects from increase in dosage   Review of Systems General and cardiovascular review of systems otherwise negative    Objective:   Physical Exam  Well-developed well-nourished male in no acute distress BP 130/80 right arm sitting position      Assessment & Plan:  Hypertension at goal continue current therapy followup in February sooner if any problems monitor blood pressure weekly at home to be sure it stays normal

## 2011-10-29 NOTE — Patient Instructions (Signed)
Monitor your blood pressure weekly at home to be sure it stays normal  Return in February is for your annual exam sooner if any problems

## 2012-03-25 ENCOUNTER — Emergency Department (HOSPITAL_COMMUNITY)
Admission: EM | Admit: 2012-03-25 | Discharge: 2012-03-26 | Disposition: A | Discharge status: Home / Self Care | Payer: BC Managed Care – PPO | Attending: Emergency Medicine | Admitting: Emergency Medicine

## 2012-03-25 ENCOUNTER — Emergency Department (HOSPITAL_COMMUNITY): Payer: BC Managed Care – PPO

## 2012-03-25 ENCOUNTER — Encounter (HOSPITAL_COMMUNITY): Payer: Self-pay | Admitting: Emergency Medicine

## 2012-03-25 DIAGNOSIS — Z7982 Long term (current) use of aspirin: Secondary | ICD-10-CM | POA: Insufficient documentation

## 2012-03-25 DIAGNOSIS — Z8669 Personal history of other diseases of the nervous system and sense organs: Secondary | ICD-10-CM | POA: Insufficient documentation

## 2012-03-25 DIAGNOSIS — I10 Essential (primary) hypertension: Secondary | ICD-10-CM | POA: Insufficient documentation

## 2012-03-25 DIAGNOSIS — E162 Hypoglycemia, unspecified: Secondary | ICD-10-CM

## 2012-03-25 DIAGNOSIS — E1169 Type 2 diabetes mellitus with other specified complication: Secondary | ICD-10-CM | POA: Insufficient documentation

## 2012-03-25 DIAGNOSIS — Z87448 Personal history of other diseases of urinary system: Secondary | ICD-10-CM | POA: Insufficient documentation

## 2012-03-25 DIAGNOSIS — R5381 Other malaise: Secondary | ICD-10-CM | POA: Insufficient documentation

## 2012-03-25 DIAGNOSIS — Z87891 Personal history of nicotine dependence: Secondary | ICD-10-CM | POA: Insufficient documentation

## 2012-03-25 DIAGNOSIS — R4182 Altered mental status, unspecified: Secondary | ICD-10-CM | POA: Insufficient documentation

## 2012-03-25 DIAGNOSIS — R5383 Other fatigue: Secondary | ICD-10-CM | POA: Insufficient documentation

## 2012-03-25 LAB — CBC WITH DIFFERENTIAL/PLATELET
Basophils Absolute: 0 10*3/uL (ref 0.0–0.1)
Eosinophils Absolute: 0.1 10*3/uL (ref 0.0–0.7)
Eosinophils Relative: 1 % (ref 0–5)
HCT: 44.1 % (ref 39.0–52.0)
MCH: 30.1 pg (ref 26.0–34.0)
MCV: 86.8 fL (ref 78.0–100.0)
Monocytes Absolute: 0.7 10*3/uL (ref 0.1–1.0)
Platelets: 295 10*3/uL (ref 150–400)
RDW: 11.4 % — ABNORMAL LOW (ref 11.5–15.5)

## 2012-03-25 LAB — BASIC METABOLIC PANEL
Calcium: 10.1 mg/dL (ref 8.4–10.5)
Creatinine, Ser: 1.15 mg/dL (ref 0.50–1.35)
GFR calc non Af Amer: 66 mL/min — ABNORMAL LOW (ref >90)
Glucose, Bld: 178 mg/dL — ABNORMAL HIGH (ref 70–99)
Sodium: 137 mEq/L (ref 135–145)

## 2012-03-25 LAB — GLUCOSE, CAPILLARY
Glucose-Capillary: 91 mg/dL (ref 70–99)
Glucose-Capillary: 202 mg/dL — ABNORMAL HIGH (ref 70–99)
Glucose-Capillary: 213 mg/dL — ABNORMAL HIGH (ref 70–99)

## 2012-03-25 NOTE — ED Notes (Signed)
MD at bedside. Jarrett Soho PA

## 2012-03-25 NOTE — ED Notes (Signed)
Patient states he is feeling slower in his responses and he has had a headache described as pressure since this afternoon.

## 2012-03-25 NOTE — ED Provider Notes (Signed)
Patient with a hx sig for IDDM was placed in CDU for holding by Lorre Munroe, PA-C.  Patient is here for glycemia and has received food, labs and imaging, and ECG. While in obeservation over night the pt slept well and had no complaints, per nursing staff. Plan per previous provider is to observe the patient for 8-12 hours after his last dose of anti-hyperglycemic medication. Patient states his last dose was at 1 PM therefore the patient can be discharged around midnight if his blood sugar remains within normal limits. Patient re-evaluated and is resting comfortable, VSS, with no new complaints or concerns at this time. On exam: hemodynamically stable, NAD, heart w/ RRR, lungs CTAB, Chest & abd non-tender, no peripheral edema or calf tenderness.  11:48 PM  CBG 132.  Patient alert and oriented, in NAD, nontoxic, nonseptic appearing  I discussed these results with the patient and his family.  I discussed the need to take his insulin on a regular basis at currently scheduled times.  I have also discussed the need to eat when taking insulin.  I have also discussed reasons to return immediately to the ER.  Patient expresses understanding and agrees with plan.  1. Medications: Usual home medications, STOP taking glipizide until you can followup with your primary care physician 2. Treatment: Rest, drink plenty of fluids, Eat healthy meals 3. Follow Up: With primary care physician      Abigail Butts, PA-C 03/26/12 0005

## 2012-03-25 NOTE — ED Notes (Signed)
Per family pt increased fatigue and weakness with episodes of trouble focusing; pt with clear speech and no neuro deficits at present; pt denies pain; EDP DR notified; CBG 91

## 2012-03-25 NOTE — Discharge Instructions (Signed)
1. Medications: Usual home medications, STOP taking glipizide until you can followup with your primary care physician 2. Treatment: Rest, drink plenty of fluids, Eat healthy meals 3. Follow Up: With primary care physician  Take medications as prescribed. Followup with your doctor in regards to your hospital visit. If you do not have a doctor use the resource list below to help he find one. You may return to the emergency department if symptoms worsen, become progressive, or become more concerning.  RESOURCE GUIDE  Dental Problems  Patients with Medicaid: [facility] Phone:  [phone]                                                  Phone:  [phone]  If unable to pay or uninsured, contact:  [facility] or [facility]. to become qualified for the adult dental clinic.  Chronic Pain Problems Contact [facility]  [phone] Patients need to be referred by their primary care doctor.  Insufficient Money for Medicine Contact [employer]:  call "211" or [place] [phone].  No Primary Care Doctor Call [facility]  [phone] Other agencies that provide inexpensive medical care    [place]  [phone]    [facility]  [place]  [phone]    [facility]  [phone]    [facility]  [place]  [place]  [phone] [place]   [phone] (emergency services [phone])  Substance Abuse [facility]  [phone] [facility] [phone] [facility] [place] [phone] [place] [phone] Residential & Outpatient Substance Abuse Program  [phone]  Abuse/Neglect [place] [phone] [place] [phone] (After Hours)  Emergency [place] [phone]  [facility] [place] [phone] [place] [phone]  MRSA Hotline #:   225-094-4451    [facility] Dept[facility] [address]                       [address]      [address] [place]  [employer] Phone:  [phone]                                   Phone:  [phone]                 Phone:  [place] Phone:  [place] [phone] (After Hours)

## 2012-03-25 NOTE — ED Provider Notes (Signed)
Patient was found by his wife today approximately 4 PM with confusion and some difficulty walking. She felt his blood sugar may have gone low. She treated him with sweet drinks.. Patient is presently alert Glasgow Coma Score 15. Patient does not regularly check his blood sugars. Suspect hypoglycemia. And discontinue glipizide. Followup PMD 03/28/2012  Orlie Dakin, MD 03/25/12 2216

## 2012-03-25 NOTE — ED Provider Notes (Signed)
History     CSN: [id number]  Arrival date & time 03/25/12  1727   First MD Initiated Contact with Patient 03/25/12 1940      Chief Complaint  Patient presents with  . Altered Mental Status  . Fatigue    (Consider location/radiation/quality/duration/timing/severity/associated sxs/prior treatment) HPI Comments: This is a 61 year old male, who presents to the emergency department with a chief complaint of weakness and fatigue. His past medical history remarkable for diabetes. He states that he does not check his sugars. He believes that his sugar might have been low earlier this morning, he reportedly drank fruit juice and mountain dew.  He states that he is feeling better now.  He denies headache, chest pain, shortness of breath, nausea, vomiting, abdominal pain, diarrhea, constipation, dysuria, peripheral edema, a numbness and tingling of the extremities. He endorses feeling weak and tired. Patient states that he is not in any pain. He does not have any focal neurologic deficits.  The history is provided by the patient. No language interpreter was used.    Past Medical History  Diagnosis Date  . Diabetes mellitus     type I and type II  . Hypertension   . ED (erectile dysfunction)   . Peripheral neuropathy     Past Surgical History  Procedure Date  . Appendectomy     Family History  Problem Relation Age of Onset  . Hypertension    . Diabetes    . Colon polyps Mother     History  Substance Use Topics  . Smoking status: Former Research scientist (life sciences)  . Smokeless tobacco: Never Used  . Alcohol Use: 0.6 oz/week    1 Cans of beer per week      Review of Systems  All other systems reviewed and are negative.    Allergies  Review of patient's allergies indicates no known allergies.  Home Medications   Current Outpatient Rx  Name  Route  Sig  Dispense  Refill  . AMITRIPTYLINE HCL 25 MG PO TABS   Oral   Take 1 tablet (25 mg total) by mouth at bedtime.   100 tablet   3   .  AMLODIPINE BESYLATE 10 MG PO TABS   Oral   Take 1 tablet (10 mg total) by mouth daily.   90 tablet   3   . ASPIRIN 325 MG PO TABS   Oral   Take 325 mg by mouth daily.           . ATENOLOL 50 MG PO TABS      2 tab in AM and 2 tab in PM         . GLIPIZIDE 10 MG PO TABS   Oral   Take 10 mg by mouth 2 (two) times daily before a meal.         . GLUCOSE BLOOD VI STRP      Use as instructed   100 each   3   . HYDROCHLOROTHIAZIDE 25 MG PO TABS   Oral   Take 1 tablet (25 mg total) by mouth daily.   100 tablet   3   . INSULIN PEN NEEDLE 29G X 12.7MM MISC   Does not apply   by Does not apply route daily. For glucose         . LANTUS SOLOSTAR 100 UNIT/ML [place] SOLN      15 units twice daily   10 mL   11     Dispense as written.   Marland Kitchen  LOSARTAN POTASSIUM 100 MG PO TABS   Oral   Take 1 tablet (100 mg total) by mouth daily.   100 tablet   3   . METFORMIN HCL 1000 MG PO TABS   Oral   Take 1 tablet (1,000 mg total) by mouth 2 (two) times daily with a meal.   200 tablet   3     BP 134/67  Pulse 72  Temp 96.5 F (35.8 C) (Oral)  Resp 18  SpO2 100%  Physical Exam  Nursing note and vitals reviewed. Constitutional: He is oriented to person, place, and time. He appears well-developed and well-nourished.  HENT:  Head: Normocephalic and atraumatic.  Eyes: Conjunctivae normal and EOM are normal. Pupils are equal, round, and reactive to light.  Neck: Normal range of motion. Neck supple.  Cardiovascular: Normal rate, regular rhythm and normal heart sounds.  Exam reveals no gallop and no friction rub.   No murmur heard. Pulmonary/Chest: Effort normal and breath sounds normal. No respiratory distress. He has no wheezes. He has no rales. He exhibits no tenderness.  Abdominal: Soft. Bowel sounds are normal. He exhibits no distension and no mass. There is no tenderness. There is no rebound and no guarding.  Musculoskeletal: Normal range of motion. He exhibits no edema and  no tenderness.  Neurological: He is alert and oriented to person, place, and time.  Skin: Skin is warm and dry.  Psychiatric: He has a normal mood and affect. His behavior is normal. Judgment and thought content normal.    ED Course  Procedures (including critical care time)  Labs Reviewed  CBC WITH DIFFERENTIAL - Abnormal; Notable for the following:    RDW 11.4 (*)     All other components within normal limits  BASIC METABOLIC PANEL - Abnormal; Notable for the following:    Glucose, Bld 178 (*)     GFR calc non Af Amer 66 (*)     GFR calc Af Amer 76 (*)     All other components within normal limits  GLUCOSE, CAPILLARY - Abnormal; Notable for the following:    Glucose-Capillary 202 (*)     All other components within normal limits  GLUCOSE, CAPILLARY   Ct Head Wo Contrast  03/25/2012  *RADIOLOGY REPORT*  Clinical Data: Altered mental status.  Fatigue, weakness.  CT HEAD WITHOUT CONTRAST  Technique:  Contiguous axial images were obtained from the base of the skull through the vertex without contrast.  Comparison: None.  Findings: There is no intra or extra-axial fluid collection or mass lesion.  The basilar cisterns and ventricles have a normal appearance.  There is no CT evidence for acute infarction or hemorrhage.  Bone windows show no calvarial fracture.  Visualized paranasal sinuses are well-aerated.  IMPRESSION: Negative exam.   Original Report Authenticated By: Nolon Nations, M.D.    ED ECG REPORT  I personally interpreted this EKG   Date: 03/25/2012   Rate: 69  Rhythm: normal sinus rhythm  QRS Axis: normal  Intervals: normal  ST/T Wave abnormalities: normal  Conduction Disutrbances:none  Narrative Interpretation:   Old EKG Reviewed: none available    1. Transient Altered Mental Status    MDM  61 year old male with DM2.  Patient reports having feeling confused and weak earlier today.  Dr. Winfred Leeds has seen this patient.  I am going to watch the patient for a few  more hours to make sure that he doesn't bottom out.  Pharmacist recommended 8-12 hours from his last  dose, which was at 1:00PM.  I am also going to stop his glipizide per Dr. Winfred Leeds and the pharmacists recommendations.   10:15 PM Patient is doing better.  I am going to move the patient to the CDU.  I have discussed the patient with Barbee Cough., PA-C, who will continue care.  Plan:  Observe until midnight with hourly CBGs.    Discharge to home if no hypoglycemic episodes.  Patient is to STOP glipizide, and follow-up with PCP on Monday.  Patient needs to check blood sugar 4x daily.     Montine Circle, PA-C 03/25/12 2358

## 2012-03-25 NOTE — ED Notes (Signed)
Patient's family states patient was unable to get out of bed, would sit there like he was trying to get his mind together. He was slow in his movements and was staggering in his gait. Family states his speech was slow and he was unable to find his words, when chewing his food he was just rolling the food around in his mouth. Family states he drinks every night.

## 2012-03-26 NOTE — ED Provider Notes (Signed)
Medical screening examination/treatment/procedure(s) were conducted as a shared visit with non-physician practitioner(s) and myself.  I personally evaluated the patient during the encounter  Orlie Dakin, MD 03/26/12 [phone]

## 2012-03-26 NOTE — ED Provider Notes (Signed)
Medical screening examination/treatment/procedure(s) were conducted as a shared visit with non-physician practitioner(s) and myself.  I personally evaluated the patient during the encounter  Orlie Dakin, MD 03/26/12 [phone]

## 2012-07-28 ENCOUNTER — Observation Stay (HOSPITAL_COMMUNITY)
Admission: EM | Admit: 2012-07-28 | Discharge: 2012-07-29 | Disposition: A | Discharge status: Home / Self Care | Payer: BC Managed Care – PPO | Attending: Emergency Medicine | Admitting: Emergency Medicine

## 2012-07-28 ENCOUNTER — Emergency Department (HOSPITAL_COMMUNITY): Payer: BC Managed Care – PPO

## 2012-07-28 ENCOUNTER — Encounter (HOSPITAL_COMMUNITY): Payer: Self-pay | Admitting: Emergency Medicine

## 2012-07-28 DIAGNOSIS — R404 Transient alteration of awareness: Principal | ICD-10-CM | POA: Insufficient documentation

## 2012-07-28 DIAGNOSIS — I951 Orthostatic hypotension: Secondary | ICD-10-CM | POA: Diagnosis present

## 2012-07-28 DIAGNOSIS — W19XXXA Unspecified fall, initial encounter: Secondary | ICD-10-CM | POA: Insufficient documentation

## 2012-07-28 DIAGNOSIS — N189 Chronic kidney disease, unspecified: Secondary | ICD-10-CM

## 2012-07-28 DIAGNOSIS — N184 Chronic kidney disease, stage 4 (severe): Secondary | ICD-10-CM | POA: Diagnosis present

## 2012-07-28 DIAGNOSIS — N179 Acute kidney failure, unspecified: Secondary | ICD-10-CM | POA: Diagnosis present

## 2012-07-28 DIAGNOSIS — E109 Type 1 diabetes mellitus without complications: Secondary | ICD-10-CM

## 2012-07-28 DIAGNOSIS — R55 Syncope and collapse: Secondary | ICD-10-CM | POA: Insufficient documentation

## 2012-07-28 DIAGNOSIS — I10 Essential (primary) hypertension: Secondary | ICD-10-CM | POA: Diagnosis present

## 2012-07-28 DIAGNOSIS — N185 Chronic kidney disease, stage 5: Secondary | ICD-10-CM | POA: Diagnosis present

## 2012-07-28 DIAGNOSIS — S0003XA Contusion of scalp, initial encounter: Secondary | ICD-10-CM | POA: Insufficient documentation

## 2012-07-28 DIAGNOSIS — E119 Type 2 diabetes mellitus without complications: Secondary | ICD-10-CM | POA: Insufficient documentation

## 2012-07-28 DIAGNOSIS — G609 Hereditary and idiopathic neuropathy, unspecified: Secondary | ICD-10-CM | POA: Diagnosis present

## 2012-07-28 DIAGNOSIS — S0083XA Contusion of other part of head, initial encounter: Secondary | ICD-10-CM | POA: Insufficient documentation

## 2012-07-28 HISTORY — DX: Chronic kidney disease, unspecified: N18.9

## 2012-07-28 LAB — COMPREHENSIVE METABOLIC PANEL
Albumin: 4.3 g/dL (ref 3.5–5.2)
Alkaline Phosphatase: 65 U/L (ref 39–117)
BUN: 34 mg/dL — ABNORMAL HIGH (ref 6–23)
CO2: 29 mEq/L (ref 19–32)
Chloride: 89 mEq/L — ABNORMAL LOW (ref 96–112)
Creatinine, Ser: 3.3 mg/dL — ABNORMAL HIGH (ref 0.50–1.35)
GFR calc Af Amer: 21 mL/min — ABNORMAL LOW (ref >90)
GFR calc non Af Amer: 18 mL/min — ABNORMAL LOW (ref >90)
Glucose, Bld: 399 mg/dL — ABNORMAL HIGH (ref 70–99)
Potassium: 3.7 mEq/L (ref 3.5–5.1)
Total Bilirubin: 0.4 mg/dL (ref 0.3–1.2)

## 2012-07-28 LAB — POCT I-STAT, CHEM 8
Creatinine, Ser: 2.9 mg/dL — ABNORMAL HIGH (ref 0.50–1.35)
Hemoglobin: 14.6 g/dL (ref 13.0–17.0)
Potassium: 4.2 mEq/L (ref 3.5–5.1)
Sodium: 132 mEq/L — ABNORMAL LOW (ref 135–145)

## 2012-07-28 LAB — URINALYSIS, ROUTINE W REFLEX MICROSCOPIC
Bilirubin Urine: NEGATIVE
Ketones, ur: NEGATIVE mg/dL
Leukocytes, UA: NEGATIVE
Nitrite: NEGATIVE
Protein, ur: NEGATIVE mg/dL
Urobilinogen, UA: 0.2 mg/dL (ref 0.0–1.0)
pH: 5 (ref 5.0–8.0)

## 2012-07-28 LAB — GLUCOSE, CAPILLARY
Glucose-Capillary: 370 mg/dL — ABNORMAL HIGH (ref 70–99)
Glucose-Capillary: 274 mg/dL — ABNORMAL HIGH (ref 70–99)
Glucose-Capillary: 316 mg/dL — ABNORMAL HIGH (ref 70–99)

## 2012-07-28 LAB — POCT I-STAT TROPONIN I: Troponin i, poc: 0 ng/mL (ref 0.00–0.08)

## 2012-07-28 LAB — CBC WITH DIFFERENTIAL/PLATELET
Basophils Relative: 0 % (ref 0–1)
HCT: 39.1 % (ref 39.0–52.0)
Hemoglobin: 14 g/dL (ref 13.0–17.0)
Lymphs Abs: 1.2 10*3/uL (ref 0.7–4.0)
MCHC: 35.8 g/dL (ref 30.0–36.0)
Monocytes Absolute: 0.6 10*3/uL (ref 0.1–1.0)
Monocytes Relative: 7 % (ref 3–12)
Neutro Abs: 7.1 10*3/uL (ref 1.7–7.7)
Neutrophils Relative %: 79 % — ABNORMAL HIGH (ref 43–77)
RBC: 4.61 MIL/uL (ref 4.22–5.81)

## 2012-07-28 LAB — TROPONIN I: Troponin I: <0.3 ng/mL (ref <0.30)

## 2012-07-28 LAB — URINE MICROSCOPIC-ADD ON

## 2012-07-28 MED ORDER — INSULIN ASPART 100 UNIT/ML ~~LOC~~ SOLN: 0.0000 [IU] | Freq: Three times a day (TID) | SUBCUTANEOUS | Status: DC

## 2012-07-28 MED ORDER — SODIUM CHLORIDE 0.9 % IV BOLUS (SEPSIS)
1000.0000 mL | Freq: Once | INTRAVENOUS | Status: AC
  Administered 2012-07-28: 1000 mL via INTRAVENOUS

## 2012-07-28 MED ORDER — SODIUM CHLORIDE 0.9 % IJ SOLN
3.0000 mL | Freq: Two times a day (BID) | INTRAMUSCULAR | Status: DC
  Administered 2012-07-28: 3 mL via INTRAVENOUS

## 2012-07-28 MED ORDER — ASPIRIN 325 MG PO TABS
325.0000 mg | ORAL_TABLET | Freq: Every day | ORAL | Status: DC
  Administered 2012-07-28 – 2012-07-29 (×2): 325 mg via ORAL
  Filled 2012-07-28 (×2): qty 1

## 2012-07-28 MED ORDER — INSULIN ASPART 100 UNIT/ML ~~LOC~~ SOLN
0.0000 [IU] | Freq: Three times a day (TID) | SUBCUTANEOUS | Status: DC
  Administered 2012-07-28: 11 [IU] via SUBCUTANEOUS
  Administered 2012-07-29: 2 [IU] via SUBCUTANEOUS
  Administered 2012-07-29: 3 [IU] via SUBCUTANEOUS

## 2012-07-28 MED ORDER — SODIUM CHLORIDE 0.9 % IV SOLN
INTRAVENOUS | Status: DC
  Administered 2012-07-28: 07:00:00 via INTRAVENOUS

## 2012-07-28 MED ORDER — AMITRIPTYLINE HCL 25 MG PO TABS
25.0000 mg | ORAL_TABLET | Freq: Every day | ORAL | Status: DC
  Administered 2012-07-28: 25 mg via ORAL
  Filled 2012-07-28 (×2): qty 1

## 2012-07-28 MED ORDER — GLIPIZIDE 10 MG PO TABS
10.0000 mg | ORAL_TABLET | Freq: Two times a day (BID) | ORAL | Status: DC
  Administered 2012-07-28 – 2012-07-29 (×4): 10 mg via ORAL
  Filled 2012-07-28 (×6): qty 1

## 2012-07-28 MED ORDER — SODIUM CHLORIDE 0.9 % IV SOLN
INTRAVENOUS | Status: DC
  Administered 2012-07-28 – 2012-07-29 (×2): via INTRAVENOUS

## 2012-07-28 MED ORDER — INSULIN GLARGINE 100 UNIT/ML ~~LOC~~ SOLN
15.0000 [IU] | Freq: Two times a day (BID) | SUBCUTANEOUS | Status: DC
  Administered 2012-07-28 (×2): 15 [IU] via SUBCUTANEOUS
  Filled 2012-07-28: qty 1

## 2012-07-28 MED ORDER — SODIUM CHLORIDE 0.9 % IV SOLN: INTRAVENOUS | Status: DC

## 2012-07-28 NOTE — Progress Notes (Signed)
[facility] PROGRESS NOTE  [name] [name] [id number] DOB: Oct 25, 1950 DOA: 07/28/2012 PCP: Joycelyn Man, MD  Brief narrative: Addendum to admission note done today 07/28/12. 62 year old male with past medical history significant for IDDM and peripheral neuropathy who presented to [facility] ED with complaints of feeling weak and dizzy started 1 day prior to this admission. On admission, patient was hypotensive with BP of 61/40 which has improved to normal with IV fluids. Pt is admitted for further observation.  Assessment/Plan:  Principal Problem:   Weakness, dizziness  Likely due to hypotension and dehydration. CBG was 370 on admission  BP is now stable 132/76  Will get PT evaluation prior to discahrge Active Problems:   DIABETES MELLITUS, TYPE I, uncontrolled with complications of peripheral neuropathy  Follow up A1c - 9  Continue Lantus 15 units BID and glipizide 10 mg BID  Appreciate  diabetic coordinator consult   PERIPHERAL NEUROPATHY  Continue Elavil   Acute kidney failure  Likely due to dehydration  Continue IV fluids  Follow up BMP in am  Code Status: full code Family Communication: no family at bedside Disposition Plan: home when stable  Leisa Lenz, MD  [facility] Pager [phone]  If 7PM-7AM, please contact night-coverage [email or web address] Password [facility] 07/28/2012, 3:51 PM   LOS: 0 days   Consultants:  None   Procedures:  None   Antibiotics:  None   HPI/Subjective: No acute events since admission.  Objective: Filed Vitals:   07/28/12 0600 07/28/12 0751 07/28/12 1225 07/28/12 1300  BP: 109/63 118/78 132/76 128/81  Pulse: 69 75 78 83  Temp:    98.1 F (36.7 C)  TempSrc:    Oral  Resp: 11 16 15 18   SpO2: 98% 100% 100% 100%   No intake or output data in the 24 hours ending 07/28/12 1551  Exam:   General:  Pt is alert, follows commands appropriately, not in acute distress  Cardiovascular: Regular rate and rhythm, S1/S2, no  murmurs, no rubs, no gallops  Respiratory: Clear to auscultation bilaterally, no wheezing, no crackles, no rhonchi  Abdomen: Soft, non tender, non distended, bowel sounds present, no guarding  Extremities: No edema, pulses DP and PT palpable bilaterally  Neuro: Grossly nonfocal  Data Reviewed: Basic Metabolic Panel:  Recent Labs Lab 07/28/12 0254 07/28/12 0345  NA 132* 132*  K 3.7 4.2  CL 89* 93*  CO2 29  --   GLUCOSE 399* 383*  BUN 34* 45*  CREATININE 3.30* 2.90*  CALCIUM 9.7  --    Liver Function Tests:  Recent Labs Lab 07/28/12 0254  AST 15  ALT 11  ALKPHOS 65  BILITOT 0.4  PROT 8.1  ALBUMIN 4.3   No results found for this basename: LIPASE, AMYLASE,  in the last 168 hours No results found for this basename: AMMONIA,  in the last 168 hours CBC:  Recent Labs Lab 07/28/12 0254 07/28/12 0345  WBC 9.0  --   NEUTROABS 7.1  --   HGB 14.0 14.6  HCT 39.1 43.0  MCV 84.8  --   PLT 249  --    Cardiac Enzymes:  Recent Labs Lab 07/28/12 0853  TROPONINI <0.30   BNP: No components found with this basename: POCBNP,  CBG:  Recent Labs Lab 07/28/12 0255 07/28/12 1014  GLUCAP 370* 274*    No results found for this or any previous visit (from the past 240 hour(s)).   Studies: Ct Head Wo Contrast  07/28/2012  *RADIOLOGY REPORT*  Clinical Data: Syncopal  episode, head trauma  CT HEAD WITHOUT CONTRAST  Technique:  Contiguous axial images were obtained from the base of the skull through the vertex without contrast.  Comparison: 03/25/2012  Findings: Posterior scalp hematoma.  No underlying calvarial fracture. There is no evidence for acute hemorrhage, hydrocephalus, mass lesion, or abnormal extra-axial fluid collection.  No definite CT evidence for acute infarction.  The visualized paranasal sinuses and mastoid air cells are predominately clear.  IMPRESSION: Posterior scalp hematoma.  No underlying calvarial fracture or acute intracranial abnormality.   Original  Report Authenticated By: Carlos Levering, M.D.    Dg Chest Portable 1 View  07/28/2012  *RADIOLOGY REPORT*  Clinical Data: Loss of consciousness, fall.  PORTABLE CHEST - 1 VIEW  Comparison: None.  Findings: Lungs are clear. No pleural effusion or pneumothorax. The cardiomediastinal contours are within normal limits. The visualized bones and soft tissues are without significant appreciable abnormality.  IMPRESSION: No radiographic evidence of acute cardiopulmonary process.   Original Report Authenticated By: Carlos Levering, M.D.     Scheduled Meds: . amitriptyline  25 mg Oral QHS  . aspirin  325 mg Oral Daily  . glipiZIDE  10 mg Oral BID AC  . insulin glargine  15 Units Subcutaneous BID  . sodium chloride  3 mL Intravenous Q12H   Continuous Infusions: . sodium chloride 100 mL/hr at 07/28/12 0715

## 2012-07-28 NOTE — ED Notes (Signed)
PT. HAD A SYNCOPAL EPISODE AT WORK THIS EVENING AND FELL  HIT HEAD AGAINST THE FLOOR , REPORTS BILATERAL EAR ACHE AND HEADACHE. ALERT AND ORIENTED / DENIES NECK PAIN .

## 2012-07-28 NOTE — H&P (Signed)
PCP:   Joycelyn Man, MD   Chief Complaint:  Passed out  HPI: 62 yo male with htn, iddm, comes in with not feeling well all day at work, every time he would get up and go he would get dizzy several times throughout the day feeling like he was gona pass out.  When he would sit down the dizzy feeling would go away.  Then finally he got up to do something and passed out and hit his head.  He has had no fevers.  No n/v.  No swelling in his legs.  No cough.  No cp.  No dysuria.  He has recently driven with his wife to [place] (6 hours one way) last week.  No h/o vte.  However he is on 4 different bp meds.  He admits to only taking his medications on a regular basis about 2 x a week for the last several months.  He saw his pcp in oct/nov 2013 at which time norvasc was added to his bp regimen, pt did NOT tell his pcp that he was NOT taking his bp medications as he should.  Over the last week he decided to start taking all of his meds correctly so he has now consistently taken his bp meds for 4 days straight now which he has not done in months.  Pt has significant orthostatic hypotension in the ED which is improving after 2 liters of ivf.  He states he feels better also but has not tried to get up out of bed since here in ED.  He denies any focal neurolgical deficits.  He was also told within the last 6 months that his "kidneys were going bad and it wouldn't be long before he was on dialysis".    Review of Systems:  Positive and negative as per HPI otherwise all other systems are negative  Past Medical History: Past Medical History  Diagnosis Date  . Diabetes mellitus     type I and type II  . Hypertension   . ED (erectile dysfunction)   . Peripheral neuropathy    Past Surgical History  Procedure Laterality Date  . Appendectomy      Medications: Prior to Admission medications   Medication Sig Start Date End Date Taking? Authorizing Provider  amitriptyline (ELAVIL) 25 MG tablet Take 1  tablet (25 mg total) by mouth at bedtime. 06/25/11  Yes Dorena Cookey, MD  amLODipine (NORVASC) 10 MG tablet Take 1 tablet (10 mg total) by mouth daily. 10/14/11 10/13/12 Yes Dorena Cookey, MD  aspirin 325 MG tablet Take 325 mg by mouth daily.     Yes Historical Provider, MD  glipiZIDE (GLUCOTROL) 10 MG tablet Take 10 mg by mouth 2 (two) times daily before a meal.   Yes Historical Provider, MD  hydrochlorothiazide (HYDRODIURIL) 25 MG tablet Take 1 tablet (25 mg total) by mouth daily. 06/25/11  Yes Dorena Cookey, MD  insulin glargine (LANTUS) 100 UNIT/ML injection Inject 15 Units into the skin 2 (two) times daily.   Yes Historical Provider, MD  losartan (COZAAR) 100 MG tablet Take 100 mg by mouth daily.   Yes Historical Provider, MD  metFORMIN (GLUCOPHAGE) 1000 MG tablet Take 1 tablet (1,000 mg total) by mouth 2 (two) times daily with a meal. 06/25/11  Yes Dorena Cookey, MD  atenolol (TENORMIN) 50 MG tablet 2 tab in AM and 2 tab in PM 06/25/11   Dorena Cookey, MD  glucose blood (ACCU-CHEK AVIVA) test strip Use  as instructed 06/23/10   Dorena Cookey, MD  Insulin Pen Needle (BD ULTRA-FINE PEN NEEDLES) 29G X 12.7MM MISC by Does not apply route daily. For glucose    Historical Provider, MD  losartan (COZAAR) 100 MG tablet Take 1 tablet (100 mg total) by mouth daily. 06/25/11 06/24/12  Dorena Cookey, MD    Allergies:  No Known Allergies  Social History:  reports that he has quit smoking. He has never used smokeless tobacco. He reports that he drinks about 0.6 ounces of alcohol per week. He reports that he does not use illicit drugs.  Family History: Family History  Problem Relation Age of Onset  . Hypertension    . Diabetes    . Colon polyps Mother     Physical Exam: Filed Vitals:   07/28/12 0436 07/28/12 0438 07/28/12 0440 07/28/12 0446  BP: 94/64 88/65 61/40  94/63  Pulse: 67 76 92   Temp:      TempSrc:      Resp:    18  SpO2:    100%   General appearance: alert, cooperative and no  distress Head: Normocephalic, without obvious abnormality, atraumatic Eyes: negative Nose: Nares normal. Septum midline. Mucosa normal. No drainage or sinus tenderness. Neck: no JVD and supple, symmetrical, trachea midline Lungs: clear to auscultation bilaterally Heart: regular rate and rhythm, S1, S2 normal, no murmur, click, rub or gallop Abdomen: soft, non-tender; bowel sounds normal; no masses,  no organomegaly Extremities: extremities normal, atraumatic, no cyanosis or edema Pulses: 2+ and symmetric Skin: Skin color, texture, turgor normal. No rashes or lesions Neurologic: Grossly normal    Labs on Admission:   Recent Labs  07/28/12 0254 07/28/12 0345  NA 132* 132*  K 3.7 4.2  CL 89* 93*  CO2 29  --   GLUCOSE 399* 383*  BUN 34* 45*  CREATININE 3.30* 2.90*  CALCIUM 9.7  --     Recent Labs  07/28/12 0254  AST 15  ALT 11  ALKPHOS 65  BILITOT 0.4  PROT 8.1  ALBUMIN 4.3    Recent Labs  07/28/12 0254 07/28/12 0345  WBC 9.0  --   NEUTROABS 7.1  --   HGB 14.0 14.6  HCT 39.1 43.0  MCV 84.8  --   PLT 249  --     Radiological Exams on Admission: Ct Head Wo Contrast  07/28/2012  *RADIOLOGY REPORT*  Clinical Data: Syncopal episode, head trauma  CT HEAD WITHOUT CONTRAST  Technique:  Contiguous axial images were obtained from the base of the skull through the vertex without contrast.  Comparison: 03/25/2012  Findings: Posterior scalp hematoma.  No underlying calvarial fracture. There is no evidence for acute hemorrhage, hydrocephalus, mass lesion, or abnormal extra-axial fluid collection.  No definite CT evidence for acute infarction.  The visualized paranasal sinuses and mastoid air cells are predominately clear.  IMPRESSION: Posterior scalp hematoma.  No underlying calvarial fracture or acute intracranial abnormality.   Original Report Authenticated By: Carlos Levering, M.D.    Dg Chest Portable 1 View  07/28/2012  *RADIOLOGY REPORT*  Clinical Data: Loss of  consciousness, fall.  PORTABLE CHEST - 1 VIEW  Comparison: None.  Findings: Lungs are clear. No pleural effusion or pneumothorax. The cardiomediastinal contours are within normal limits. The visualized bones and soft tissues are without significant appreciable abnormality.  IMPRESSION: No radiographic evidence of acute cardiopulmonary process.   Original Report Authenticated By: Carlos Levering, M.D.     Assessment/Plan 62 yo male with syncopal  episode likely due to orthostatic hypotension from inconsistent use of antihypertensive medication with ckd  Principal Problem:   Syncope due to orthostatic hypotension Active Problems:   DIABETES MELLITUS, TYPE I   HYPERTENSION   Orthostatic hypotension   CKD (chronic kidney disease)  orthostatis prob due to recently taking all of his bp meds (norvasc, ARB, hctz, and atenolol) for 4 days in a row which he has not done in months (normally takes his bp meds about 2 days a week).  i have had a thorough discussion with the pt with his dtr present about how and why this is unsafe to do, particularly if his pcp is unaware of his noncompliance and added on more meds.  ua is pending but doubt underlying infection.  He has had ckd (for about 6 months now), unsure if he has had nephrology evaluation but cr at his baseline.  Bolus another liter of ivf (3 total).  Advised pt not to get up out of bed without assistance.  Hold all bp meds and will need to restart them slower and lower.  Also ekg showed some questionable st seg elevation, EDP has spoken to cardiologist who thought this was early repol.  Will serial his enzymes and ck 2d echo.  Full code.  Certainly in ddx would be PE, would consider if he develops any hypoxia, tachycardia, sob, cp, etc.  Charne Mcbrien A 07/28/2012, 5:35 AM

## 2012-07-28 NOTE — ED Notes (Signed)
Pt's CBG 370

## 2012-07-28 NOTE — ED Notes (Signed)
Pt transported to ECHO. 

## 2012-07-28 NOTE — Progress Notes (Signed)
Echocardiogram 2D Echocardiogram has been performed.  BROWN, CALEBA 07/28/2012, 8:55 AM

## 2012-07-28 NOTE — ED Provider Notes (Signed)
History     CSN: [id number]  Arrival date & time 07/28/12  0246   None     Chief Complaint  Patient presents with  . Loss of Consciousness  . Fall    (Consider location/radiation/quality/duration/timing/severity/associated sxs/prior treatment) HPI Hx per PT - is diabetic, at work tonight, felt lightheaded and had a syncopal event - hit the back of his head. Since event still feeling lightheaded and dizzy with scalp soreness.  No CP or SOB. No h/o same.  Is taking DM medications intermittently.  Blood sugar in the 300s tonight.  He denies any F/C, N/V/D, ABD pain, back pain, diaphoresis or ACS symptoms otherwise.  MOD in severity.   Past Medical History  Diagnosis Date  . Diabetes mellitus     type I and type II  . Hypertension   . ED (erectile dysfunction)   . Peripheral neuropathy     Past Surgical History  Procedure Laterality Date  . Appendectomy      Family History  Problem Relation Age of Onset  . Hypertension    . Diabetes    . Colon polyps Mother     History  Substance Use Topics  . Smoking status: Former Research scientist (life sciences)  . Smokeless tobacco: Never Used  . Alcohol Use: 0.6 oz/week    1 Cans of beer per week      Review of Systems  Constitutional: Negative for fever and chills.  HENT: Negative for neck pain and neck stiffness.   Eyes: Negative for pain.  Respiratory: Negative for shortness of breath.   Cardiovascular: Negative for chest pain.  Gastrointestinal: Negative for abdominal pain.  Genitourinary: Negative for dysuria.  Musculoskeletal: Negative for back pain.  Skin: Negative for rash.  Neurological: Positive for syncope and light-headedness.  All other systems reviewed and are negative.    Allergies  Review of patient's allergies indicates no known allergies.  Home Medications   Current Outpatient Rx  Name  Route  Sig  Dispense  Refill  . amitriptyline (ELAVIL) 25 MG tablet   Oral   Take 1 tablet (25 mg total) by mouth at bedtime.   100  tablet   3   . amLODipine (NORVASC) 10 MG tablet   Oral   Take 1 tablet (10 mg total) by mouth daily.   90 tablet   3   . aspirin 325 MG tablet   Oral   Take 325 mg by mouth daily.           Marland Kitchen atenolol (TENORMIN) 50 MG tablet      2 tab in AM and 2 tab in PM         . glipiZIDE (GLUCOTROL) 10 MG tablet   Oral   Take 10 mg by mouth 2 (two) times daily before a meal.         . glucose blood (ACCU-CHEK AVIVA) test strip      Use as instructed   100 each   3   . hydrochlorothiazide (HYDRODIURIL) 25 MG tablet   Oral   Take 1 tablet (25 mg total) by mouth daily.   100 tablet   3   . Insulin Pen Needle (BD ULTRA-FINE PEN NEEDLES) 29G X 12.7MM MISC   Does not apply   by Does not apply route daily. For glucose         . LANTUS SOLOSTAR 100 UNIT/ML injection      15 units twice daily   10 mL   11  Dispense as written.   Marland Kitchen EXPIRED: losartan (COZAAR) 100 MG tablet   Oral   Take 1 tablet (100 mg total) by mouth daily.   100 tablet   3   . metFORMIN (GLUCOPHAGE) 1000 MG tablet   Oral   Take 1 tablet (1,000 mg total) by mouth 2 (two) times daily with a meal.   200 tablet   3     BP 106/59  Pulse 74  Temp(Src) 97.5 F (36.4 C) (Oral)  Resp 16  SpO2 99%  Physical Exam  Constitutional: He is oriented to person, place, and time. He appears well-developed and well-nourished.  HENT:  Head: Normocephalic.  mildly dry mm. Posterior scalp hematoma - no lac TTP  Eyes: Conjunctivae and EOM are normal. Pupils are equal, round, and reactive to light.  Neck: Trachea normal. Neck supple. No thyromegaly present.  Cardiovascular: Normal rate, regular rhythm, S1 normal, S2 normal and normal pulses.     No systolic murmur is present   No diastolic murmur is present  Pulses:      Radial pulses are 2+ on the right side, and 2+ on the left side.  Pulmonary/Chest: Effort normal and breath sounds normal. He has no wheezes. He has no rhonchi. He has no rales. He  exhibits no tenderness.  Abdominal: Soft. Normal appearance and bowel sounds are normal. There is no tenderness. There is no CVA tenderness and negative Murphy's sign.  Musculoskeletal:  BLE:s Calves nontender, no cords or erythema, negative Homans sign  Neurological: He is alert and oriented to person, place, and time. He has normal strength. No cranial nerve deficit or sensory deficit. GCS eye subscore is 4. GCS verbal subscore is 5. GCS motor subscore is 6.  Skin: Skin is warm and dry. No rash noted. He is not diaphoretic.  Psychiatric: His speech is normal.  Cooperative and appropriate    ED Course  Procedures (including critical care time)   Date: 07/28/2012  Rate: 78  Rhythm: normal sinus rhythm  QRS Axis: normal  Intervals: normal  ST/T Wave abnormalities: nonspecific ST changes  Conduction Disutrbances:none  Narrative Interpretation:   Old EKG Reviewed: changes noted previous ECG shows NSST elevations in anterior leads similar to elevations V3/V4 on ECG now - no recip changes  IVFs, orthostatic dizzieness and VS noted. Cont IVFs and MED consult.    3:31 AM d/w CAR on call Dr Colon Flattery reviewing ECG, he feels this is likley J point elevation with h/o same on previous ECGs, given no ACS symptoms no Code STEMI called  MDM  Syncope. Dehydration. ARI. Hyperglycemia.   IVFs.   ECG, CXR. CT head. Labs.  MED admit        Teressa Lower, MD 07/28/12 [id number]

## 2012-07-28 NOTE — ED Notes (Signed)
Carb-modified tray ordered for patient. 

## 2012-07-29 LAB — GLUCOSE, CAPILLARY
Glucose-Capillary: 51 mg/dL — ABNORMAL LOW (ref 70–99)
Glucose-Capillary: 168 mg/dL — ABNORMAL HIGH (ref 70–99)

## 2012-07-29 LAB — BASIC METABOLIC PANEL
CO2: 29 mEq/L (ref 19–32)
Calcium: 8.9 mg/dL (ref 8.4–10.5)
Creatinine, Ser: 1.33 mg/dL (ref 0.50–1.35)
GFR calc non Af Amer: 55 mL/min — ABNORMAL LOW (ref >90)

## 2012-07-29 LAB — CBC
MCH: 29 pg (ref 26.0–34.0)
MCV: 85.4 fL (ref 78.0–100.0)
Platelets: 213 10*3/uL (ref 150–400)
RDW: 11.6 % (ref 11.5–15.5)
WBC: 5.3 10*3/uL (ref 4.0–10.5)

## 2012-07-29 MED ORDER — INSULIN GLARGINE 100 UNIT/ML ~~LOC~~ SOLN: 7.0000 [IU] | Freq: Two times a day (BID) | SUBCUTANEOUS | Status: DC

## 2012-07-29 NOTE — Progress Notes (Addendum)
Brief Nutrition Note:  Pulled to the pt due to Malnutrition Screening Tool. Pt's chart has been reviewed.   Upon admission, pt reported unintentional weight loss of 15-20 pounds which he stated was from water weight.  Wt Readings from Last 10 Encounters:  10/29/11 180 lb (81.647 kg)  10/14/11 179 lb (81.194 kg)  08/13/11 182 lb (82.555 kg)  07/30/11 186 lb (84.369 kg)  07/02/11 179 lb (81.194 kg)  06/25/11 178 lb (80.74 kg)  02/05/11 185 lb (83.915 kg)  11/04/10 185 lb (83.915 kg)  08/11/10 182 lb (82.555 kg)  07/31/10 181 lb (82.101 kg)    Pt currently on a Carb Modified, Medium diet and ate 100% for breakfast. BMI 26.4 kg/(m^2), pt meets criteria for overweight based on current BMI.  No nutrition intervention warranted at this time. Please consult RD if nutrition intervention is needed.  Jarome Matin Dietetic Intern # [phone]  Arthur Holms, RD, LDN Pager #: [phone] After-Hours Pager #: [phone]

## 2012-07-29 NOTE — Care Management Note (Signed)
    Page 1 of 1   07/29/2012     4:34:00 PM   CARE MANAGEMENT NOTE 07/29/2012  Patient:  [name] [name]   Account Number:  [id number]  Date Initiated:  07/29/2012  Documentation initiated by:  GRAVES-BIGELOW,BRENDA  Subjective/Objective Assessment:   Pt admitted with syncopal episode. Plan for return home today. Pt lives with wife and she will help husband with medications due to he is noncompliant with meds.     Action/Plan:   Pt is not eligible for [facility] services at this time. Pt still works. No further needs form CM at this time.   Anticipated DC Date:  07/29/2012   Anticipated DC Plan:  [name] [name]  CM consult  Homebound not met per [name] [name]      Choice offered to / List presented to:             Status of service:  Completed, signed off Medicare Important Message given?   (If response is "NO", the following Medicare IM given date fields will be blank) Date Medicare IM given:   Date Additional Medicare IM given:    Discharge Disposition:  HOME/SELF CARE  Per UR Regulation:  Reviewed for med. necessity/level of care/duration of stay  If discussed at [place] of Stay Meetings, dates discussed:    Comments:

## 2012-07-29 NOTE — Discharge Summary (Signed)
Physician Discharge Summary  [name] [name] [id number] DOB: [birth year] DOA: 07/28/2012  PCP: Joycelyn Man, MD  Admit date: 07/28/2012 Discharge date: 07/29/2012  Recommendations for Outpatient Follow-up:  1. With PCP in 1-2 weeks post discharge. 2. Check if insulin regimen needs to be corrected, now on 7 units BID and glipizide  Discharge Diagnoses:  Principal Problem:   Syncope due to orthostatic hypotension Active Problems:   DIABETES MELLITUS, TYPE I   PERIPHERAL NEUROPATHY   Acute kidney failure  Discharge Condition: medically stable for discharge home today; PT eval - no PT follow up required  Diet recommendation: diabetic, as tolerated  History of present illness:  62 year old male with past medical history significant for IDDM and peripheral neuropathy who presented to [facility] ED with complaints of feeling weak and dizzy started 1 day prior to this admission. On admission, patient was hypotensive with BP of 61/40 which has improved to normal with IV fluids.   Assessment/Plan:   Principal Problem:  Weakness, dizziness  Likely due to hypotension and dehydration. CBG was 370 on admission  BP is now stable 136/83 Active Problems:  DIABETES MELLITUS, TYPE I, uncontrolled with complications of peripheral neuropathy  Follow up A1c - 9 Continue Lantus 7 units BID (due to episodes of hypoglycemia) as recommended by diabetic coordinator and continue glipizide 10 mg BID. Please note that the patient does have history of non compliance with insulin regimen. He was strongly encouraged to be compliant with meds. PERIPHERAL NEUROPATHY  Continue Elavil Acute kidney failure  Likely due to dehydration  Resolved     Code Status: full code  Family Communication: no family at bedside  Disposition Plan: home when stable   Leisa Lenz, MD  [facility]  Pager [phone]   Consultants:  None  Procedures:  None  Antibiotics:  None    Discharge Exam: Filed Vitals:   07/29/12  0609  BP: 136/83  Pulse: 83  Temp:   Resp: 20   Filed Vitals:   07/29/12 0600 07/29/12 0603 07/29/12 0606 07/29/12 0609  BP: 133/74 154/82 150/82 136/83  Pulse: 68 82 83 83  Temp: 97.5 F (36.4 C)     TempSrc: Oral     Resp: 20 20 20 20   SpO2: 100% 100% 100% 100%    General: Pt is alert, follows commands appropriately, not in acute distress Cardiovascular: Regular rate and rhythm, S1/S2 +, no murmurs, no rubs, no gallops Respiratory: Clear to auscultation bilaterally, no wheezing, no crackles, no rhonchi Abdominal: Soft, non tender, non distended, bowel sounds +, no guarding Extremities: no edema, no cyanosis, pulses palpable bilaterally DP and PT Neuro: Grossly nonfocal  Discharge Instructions  Discharge Orders   Future Orders Complete By Expires     Call MD for:  difficulty breathing, headache or visual disturbances  As directed     Call MD for:  persistant dizziness or light-headedness  As directed     Call MD for:  persistant nausea and vomiting  As directed     Call MD for:  severe uncontrolled pain  As directed     Diet - low sodium heart healthy  As directed     Discharge instructions  As directed     Comments:      Stop taking Hctz for now. Please recheck you BP tomorrow, 1 day after your discharge. If systolic BP > you may take Hctz. If SBP< 110 then consider stopping all blood pressure medications and call your PCP as you may need  adjustment in you Blood pressure medications.    Increase activity slowly  As directed         Medication List    STOP taking these medications       hydrochlorothiazide 25 MG tablet  Commonly known as:  HYDRODIURIL      TAKE these medications       amitriptyline 25 MG tablet  Commonly known as:  ELAVIL  Take 1 tablet (25 mg total) by mouth at bedtime.     amLODipine 10 MG tablet  Commonly known as:  NORVASC  Take 1 tablet (10 mg total) by mouth daily.     aspirin 325 MG tablet  Take 325 mg by mouth daily.     atenolol  50 MG tablet  Commonly known as:  TENORMIN  2 tab in AM and 2 tab in PM     BD ULTRA-FINE PEN NEEDLES 29G X 12.7MM Misc  Generic drug:  Insulin Pen Needle  by Does not apply route daily. For glucose     glipiZIDE 10 MG tablet  Commonly known as:  GLUCOTROL  Take 10 mg by mouth 2 (two) times daily before a meal.     glucose blood test strip  Commonly known as:  ACCU-CHEK AVIVA  Use as instructed     insulin glargine 100 UNIT/ML injection  Commonly known as:  LANTUS  Inject 7 Units into the skin 2 (two) times daily.     losartan 100 MG tablet  Commonly known as:  COZAAR  Take 100 mg by mouth daily.     metFORMIN 1000 MG tablet  Commonly known as:  GLUCOPHAGE  Take 1 tablet (1,000 mg total) by mouth 2 (two) times daily with a meal.           Follow-up Information   Schedule an appointment as soon as possible for a visit with TODD,JEFFREY Zenia Resides, MD.   Contact information:   [place] [address] [phone]        The results of significant diagnostics from this hospitalization (including imaging, microbiology, ancillary and laboratory) are listed below for reference.    Significant Diagnostic Studies: Ct Head Wo Contrast  07/28/2012  *RADIOLOGY REPORT*  Clinical Data: Syncopal episode, head trauma  CT HEAD WITHOUT CONTRAST  Technique:  Contiguous axial images were obtained from the base of the skull through the vertex without contrast.  Comparison: 03/25/2012  Findings: Posterior scalp hematoma.  No underlying calvarial fracture. There is no evidence for acute hemorrhage, hydrocephalus, mass lesion, or abnormal extra-axial fluid collection.  No definite CT evidence for acute infarction.  The visualized paranasal sinuses and mastoid air cells are predominately clear.  IMPRESSION: Posterior scalp hematoma.  No underlying calvarial fracture or acute intracranial abnormality.   Original Report Authenticated By: Carlos Levering, M.D.    Dg Chest  Portable 1 View  07/28/2012  *RADIOLOGY REPORT*  Clinical Data: Loss of consciousness, fall.  PORTABLE CHEST - 1 VIEW  Comparison: None.  Findings: Lungs are clear. No pleural effusion or pneumothorax. The cardiomediastinal contours are within normal limits. The visualized bones and soft tissues are without significant appreciable abnormality.  IMPRESSION: No radiographic evidence of acute cardiopulmonary process.   Original Report Authenticated By: Carlos Levering, M.D.     Microbiology: No results found for this or any previous visit (from the past 240 hour(s)).   Labs: Basic Metabolic Panel:  Recent Labs Lab 07/28/12 0254 07/28/12 0345 07/29/12 0504  NA 132* 132*  142  K 3.7 4.2 3.7  CL 89* 93* 106  CO2 29  --  29  GLUCOSE 399* 383* 69*  BUN 34* 45* 21  CREATININE 3.30* 2.90* 1.33  CALCIUM 9.7  --  8.9   Liver Function Tests:  Recent Labs Lab 07/28/12 0254  AST 15  ALT 11  ALKPHOS 65  BILITOT 0.4  PROT 8.1  ALBUMIN 4.3   No results found for this basename: LIPASE, AMYLASE,  in the last 168 hours No results found for this basename: AMMONIA,  in the last 168 hours CBC:  Recent Labs Lab 07/28/12 0254 07/28/12 0345 07/29/12 0504  WBC 9.0  --  5.3  NEUTROABS 7.1  --   --   HGB 14.0 14.6 11.7*  HCT 39.1 43.0 34.4*  MCV 84.8  --  85.4  PLT 249  --  213   Cardiac Enzymes:  Recent Labs Lab 07/28/12 0853 07/28/12 1519 07/28/12 1905  TROPONINI <0.30 <0.30 <0.30   BNP: BNP (last 3 results) No results found for this basename: PROBNP,  in the last 8760 hours CBG:  Recent Labs Lab 07/28/12 1704 07/28/12 2152 07/29/12 0808 07/29/12 0811 07/29/12 0909  GLUCAP 316* 136* 29* 51* 150*    Time coordinating discharge: Over 30 minutes  Signed:  Leisa Lenz, MD  [facility]  07/29/2012, 11:50 AM  Pager #: [phone]

## 2012-07-29 NOTE — Progress Notes (Addendum)
Inpatient Diabetes Program Recommendations  AACE/ADA: New Consensus Statement on Inpatient Glycemic Control (2013)  Target Ranges:  Prepandial:   less than 140 mg/dL      Peak postprandial:   less than 180 mg/dL (1-2 hours)      Critically ill patients:  140 - 180 mg/dL   Reason for Visit: Note A1C=15.9%.  Patient see's Dr. Sherren Mocha for diabetes.  Patient admits that he was taking insulin sporadically. Yesterday patient received Lantus 15 units bid and CBG dropped to 29 mg/dL this morning.  Patient reports that he was asymptomatic and "did not feel it".  Patient likely needs decreased Lantus prior to discharge home.  He also needs to follow-up with PCP as soon as possible after discharge.  He is agreeable to go to [facility] for further education after discharge.  Will place referral per protocol. Instructed him to check CBG's twice a day also.   [place] and discussed with Dr. Charlies Silvers.  She states that she will decrease Lantus dose prior to discharge.

## 2012-07-29 NOTE — Evaluation (Signed)
Physical Therapy Evaluation Patient Details Name: [name] [name] MRN: [id number] DOB: 07-29-1950 Today's Date: 07/29/2012 Time: [id number] PT Time Calculation (min): 19 min  PT Assessment / Plan / Recommendation Clinical Impression  pt adm with recurrent orthostatic hypotension that resolved with IVF.  Pt instructed in his med taking schedule.  Now back to baseline.  No further PT needs.  No follow up.  Sign off,.    PT Assessment  Patent does not need any further PT services    Follow Up Recommendations  No PT follow up    Does the patient have the potential to tolerate intense rehabilitation      Barriers to Discharge        Equipment Recommendations  None recommended by PT    Recommendations for Other Services     Frequency      Precautions / Restrictions Precautions Precautions: None Restrictions Weight Bearing Restrictions: No   Pertinent Vitals/Pain       Mobility  Bed Mobility Bed Mobility: Supine to Sit;Sitting - Scoot to Edge of Bed Supine to Sit: 7: Independent Sitting - Scoot to Edge of Bed: 7: Independent Details for Bed Mobility Assistance: safe mob Transfers Transfers: Sit to Stand;Stand to Sit Sit to Stand: 7: Independent Stand to Sit: 7: Independent Details for Transfer Assistance: safe mobility Ambulation/Gait Ambulation/Gait Assistance: 7: Independent Ambulation Distance (Feet): 400 Feet Assistive device: None Ambulation/Gait Assistance Details: steady even with challenges to balance and higher level balance activity Gait Pattern: Within Functional Limits Stairs: Yes Stairs Assistance: 6: Modified independent (Device/Increase time) Stair Management Technique: One rail Right;Alternating pattern;Forwards Number of Stairs: 3 Wheelchair Mobility Wheelchair Mobility: No    Exercises     PT Diagnosis:    PT Problem List:   PT Treatment Interventions:     PT Goals Acute Rehab PT Goals PT Goal Formulation: With patient  Visit  Information  Last PT Received On: 07/29/12 Assistance Needed: +1    Subjective Data  Subjective: No, I've felt good after thdy gave me fluids Patient Stated Goal: Home independent, back to work   Prior [place] With: Spouse;Daughter Available Help at Discharge: Family;Available PRN/intermittently Type of Home: House Home Access: Stairs to enter [employer] of Steps: 2/1 Home Layout: One level Biochemist, clinical: Standard Home Adaptive Equipment: Straight cane Prior Function Level of Independence: Independent Able to Take Stairs?: Yes Driving: Yes Vocation: Full time employment Communication Communication: No difficulties    Cognition  Cognition Overall Cognitive Status: Appears within functional limits for tasks assessed/performed Arousal/Alertness: Awake/alert Orientation Level: Appears intact for tasks assessed Behavior During Session: [facility] for tasks performed    Extremity/Trunk Assessment Right Upper Extremity Assessment RUE ROM/Strength/Tone: Within functional levels Left Upper Extremity Assessment LUE ROM/Strength/Tone: Within functional levels Right Lower Extremity Assessment RLE ROM/Strength/Tone: Within functional levels RLE Sensation: History of peripheral neuropathy Left Lower Extremity Assessment LLE ROM/Strength/Tone: Within functional levels LLE Sensation: History of peripheral neuropathy Trunk Assessment Trunk Assessment: Normal   Balance Balance Balance Assessed: Yes Standardized Balance Assessment Standardized Balance Assessment: Dynamic Gait Index Dynamic Gait Index Level Surface: Normal Change in Gait Speed: Normal Gait with Horizontal Head Turns: Normal Gait with Vertical Head Turns: Normal Gait and Pivot Turn: Normal Step Over Obstacle: Normal Step Around Obstacles: Normal Steps: Mild Impairment Total Score: 23 High Level Balance High Level Balance Activites: Backward walking;Direction changes;Turns;Sudden  stops;Head turns High Level Balance Comments: safe with no deviations  End of Session PT - End of Session Activity Tolerance: Patient  tolerated treatment well Patient left: Other (comment);with call bell/phone within reach (sitting EOB) Nurse Communication: Mobility status  GP Functional Assessment Tool Used: clinical judgement Functional Limitation: Mobility: Walking and moving around Mobility: Walking and Moving Around Current Status [id number]): 0 percent impaired, limited or restricted Mobility: Walking and Moving Around Goal Status [id number]): 0 percent impaired, limited or restricted Mobility: Walking and Moving Around Discharge Status [phone]): 0 percent impaired, limited or restricted   Mottinger, Tessie Fass 07/29/2012, 2:23 PM 07/29/2012  Donnella Sham, [place] [phone] (pager)

## 2012-07-30 ENCOUNTER — Other Ambulatory Visit: Payer: Self-pay | Admitting: Family Medicine

## 2012-08-01 ENCOUNTER — Other Ambulatory Visit: Payer: Self-pay | Admitting: Family Medicine

## 2012-08-02 ENCOUNTER — Encounter: Payer: BC Managed Care – PPO | Attending: Internal Medicine | Admitting: *Deleted

## 2012-08-02 ENCOUNTER — Encounter: Payer: Self-pay | Admitting: *Deleted

## 2012-08-02 VITALS — Ht 70.0 in | Wt 164.8 lb

## 2012-08-02 DIAGNOSIS — Z713 Dietary counseling and surveillance: Secondary | ICD-10-CM | POA: Insufficient documentation

## 2012-08-02 DIAGNOSIS — E109 Type 1 diabetes mellitus without complications: Secondary | ICD-10-CM

## 2012-08-02 NOTE — Progress Notes (Signed)
  Medical Nutrition Therapy:  Appt start time: 1130 end time:  1230.  Assessment:  Primary concerns today: patient here for diabetes education. States history of diabetes for over 15 years, states on insulin the last 2 years. SMBG sporadically. Lives with wife, daughter and grand daughter.  Wife and daughter do food shopping and food preparation. 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 Recent hospitalization for hyperglycemia.  MEDICATIONS: see list   DIETARY INTAKE:  Usual eating pattern includes 2 meals and 0-1 snacks per day.  Everyday foods include fair variety of all food groups.  Avoided foods include: none stated.    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   Nutritional Diagnosis:  NB-1.1 Food and nutrition-related knowledge deficit As related to diabetes management.  As evidenced by A1c of 15.9% on 07/28/12.    Intervention:  Nutrition counseling and diabetes education initiated. Discussed basic physiology of diabetes, SMBG and rationale of checking BG at alternate times of day, A1c, and action of his diabetes medications including insulin. Plan to discuss Carb Counting, reading food labels and benefits of increased activity at next visit.  Plan: .Ask MD about taking Lantus once a day vs twice a day since dose is so small Consider checking BG daily at alternate times per day as in before or after a meal  to collect more data as to control.   Handouts given during visit include: Living Well with Diabetes Carb Counting and Food Label handouts Meal Plan Card  Diabetes Medication List  Monitoring/Evaluation:  Dietary intake, exercise, SMBG, and body weight in 4 week(s).

## 2012-08-02 NOTE — Patient Instructions (Addendum)
Ask MD about taking Lantus once a day vs twice a day since dose is so small Consider checking BG daily at alternate times per day as in before or after a meal

## 2012-08-07 ENCOUNTER — Other Ambulatory Visit: Payer: Self-pay | Admitting: Family Medicine

## 2012-08-13 ENCOUNTER — Other Ambulatory Visit: Payer: Self-pay | Admitting: Family Medicine

## 2012-12-16 ENCOUNTER — Other Ambulatory Visit: Payer: Self-pay | Admitting: Family Medicine

## 2013-08-10 ENCOUNTER — Other Ambulatory Visit: Payer: Self-pay | Admitting: Family Medicine

## 2013-08-15 ENCOUNTER — Telehealth: Payer: Self-pay | Admitting: *Deleted

## 2013-08-17 NOTE — Telephone Encounter (Signed)
[name] [name] called for refill of glucose blood Bayer test strip   Pt test 3 x/day 300 quantity for 3 mo supply.  Fax [phone]

## 2013-08-25 NOTE — Telephone Encounter (Signed)
Order faxed.

## 2013-12-29 LAB — HM DIABETES EYE EXAM

## 2014-01-02 ENCOUNTER — Encounter: Payer: Self-pay | Admitting: Family Medicine

## 2014-07-31 ENCOUNTER — Emergency Department (HOSPITAL_COMMUNITY): Payer: BC Managed Care – PPO

## 2014-07-31 ENCOUNTER — Encounter (HOSPITAL_COMMUNITY): Payer: Self-pay | Admitting: Emergency Medicine

## 2014-07-31 ENCOUNTER — Inpatient Hospital Stay (HOSPITAL_COMMUNITY)
Admission: EM | Admit: 2014-07-31 | Discharge: 2014-08-02 | DRG: 871 | Disposition: A | Discharge status: Home / Self Care | Payer: BC Managed Care – PPO | Attending: Internal Medicine | Admitting: Internal Medicine

## 2014-07-31 DIAGNOSIS — E1165 Type 2 diabetes mellitus with hyperglycemia: Secondary | ICD-10-CM | POA: Diagnosis present

## 2014-07-31 DIAGNOSIS — J40 Bronchitis, not specified as acute or chronic: Secondary | ICD-10-CM | POA: Diagnosis present

## 2014-07-31 DIAGNOSIS — A419 Sepsis, unspecified organism: Secondary | ICD-10-CM | POA: Diagnosis present

## 2014-07-31 DIAGNOSIS — R739 Hyperglycemia, unspecified: Secondary | ICD-10-CM

## 2014-07-31 DIAGNOSIS — N529 Male erectile dysfunction, unspecified: Secondary | ICD-10-CM | POA: Diagnosis present

## 2014-07-31 DIAGNOSIS — Z9119 Patient's noncompliance with other medical treatment and regimen: Secondary | ICD-10-CM | POA: Diagnosis present

## 2014-07-31 DIAGNOSIS — R6510 Systemic inflammatory response syndrome (SIRS) of non-infectious origin without acute organ dysfunction: Secondary | ICD-10-CM | POA: Diagnosis present

## 2014-07-31 DIAGNOSIS — Z87891 Personal history of nicotine dependence: Secondary | ICD-10-CM

## 2014-07-31 DIAGNOSIS — I129 Hypertensive chronic kidney disease with stage 1 through stage 4 chronic kidney disease, or unspecified chronic kidney disease: Secondary | ICD-10-CM | POA: Diagnosis present

## 2014-07-31 DIAGNOSIS — E1100 Type 2 diabetes mellitus with hyperosmolarity without nonketotic hyperglycemic-hyperosmolar coma (NKHHC): Secondary | ICD-10-CM | POA: Diagnosis present

## 2014-07-31 DIAGNOSIS — J111 Influenza due to unidentified influenza virus with other respiratory manifestations: Secondary | ICD-10-CM | POA: Diagnosis present

## 2014-07-31 DIAGNOSIS — N179 Acute kidney failure, unspecified: Secondary | ICD-10-CM | POA: Diagnosis present

## 2014-07-31 DIAGNOSIS — G629 Polyneuropathy, unspecified: Secondary | ICD-10-CM | POA: Diagnosis present

## 2014-07-31 DIAGNOSIS — I951 Orthostatic hypotension: Secondary | ICD-10-CM

## 2014-07-31 DIAGNOSIS — N189 Chronic kidney disease, unspecified: Secondary | ICD-10-CM | POA: Diagnosis present

## 2014-07-31 LAB — CBC WITH DIFFERENTIAL/PLATELET
Basophils Absolute: 0 10*3/uL (ref 0.0–0.1)
Basophils Relative: 0 % (ref 0–1)
Eosinophils Absolute: 0 10*3/uL (ref 0.0–0.7)
Eosinophils Relative: 0 % (ref 0–5)
HCT: 44.1 % (ref 39.0–52.0)
Hemoglobin: 15.2 g/dL (ref 13.0–17.0)
Lymphocytes Relative: 8 % — ABNORMAL LOW (ref 12–46)
Lymphs Abs: 0.7 10*3/uL (ref 0.7–4.0)
MCH: 29.5 pg (ref 26.0–34.0)
MCHC: 34.5 g/dL (ref 30.0–36.0)
MCV: 85.6 fL (ref 78.0–100.0)
MONOS PCT: 13 % — AB (ref 3–12)
Monocytes Absolute: 1.1 10*3/uL — ABNORMAL HIGH (ref 0.1–1.0)
NEUTROS PCT: 79 % — AB (ref 43–77)
Neutro Abs: 6.7 10*3/uL (ref 1.7–7.7)
PLATELETS: 214 10*3/uL (ref 150–400)
RBC: 5.15 MIL/uL (ref 4.22–5.81)
RDW: 11.8 % (ref 11.5–15.5)
WBC: 8.6 10*3/uL (ref 4.0–10.5)

## 2014-07-31 LAB — COMPREHENSIVE METABOLIC PANEL
ALBUMIN: 3.9 g/dL (ref 3.5–5.2)
ALK PHOS: 80 U/L (ref 39–117)
ALT: 25 U/L (ref 0–53)
ANION GAP: 15 (ref 5–15)
AST: 26 U/L (ref 0–37)
BUN: 34 mg/dL — AB (ref 6–23)
CHLORIDE: 96 mmol/L (ref 96–112)
CO2: 23 mmol/L (ref 19–32)
Calcium: 10.2 mg/dL (ref 8.4–10.5)
Creatinine, Ser: 2.08 mg/dL — ABNORMAL HIGH (ref 0.50–1.35)
GFR calc Af Amer: 37 mL/min — ABNORMAL LOW (ref >90)
GFR calc non Af Amer: 32 mL/min — ABNORMAL LOW (ref >90)
Glucose, Bld: 475 mg/dL — ABNORMAL HIGH (ref 70–99)
POTASSIUM: 4.9 mmol/L (ref 3.5–5.1)
Sodium: 134 mmol/L — ABNORMAL LOW (ref 135–145)
TOTAL PROTEIN: 7.8 g/dL (ref 6.0–8.3)
Total Bilirubin: 0.9 mg/dL (ref 0.3–1.2)

## 2014-07-31 LAB — URINE MICROSCOPIC-ADD ON

## 2014-07-31 LAB — URINALYSIS, ROUTINE W REFLEX MICROSCOPIC
BILIRUBIN URINE: NEGATIVE
Glucose, UA: >1000 mg/dL — AB
Ketones, ur: NEGATIVE mg/dL
Leukocytes, UA: NEGATIVE
NITRITE: NEGATIVE
PROTEIN: 100 mg/dL — AB
Specific Gravity, Urine: 1.034 — ABNORMAL HIGH (ref 1.005–1.030)
UROBILINOGEN UA: 0.2 mg/dL (ref 0.0–1.0)
pH: 5 (ref 5.0–8.0)

## 2014-07-31 LAB — CBG MONITORING, ED: GLUCOSE-CAPILLARY: 470 mg/dL — AB (ref 70–99)

## 2014-07-31 LAB — I-STAT CG4 LACTIC ACID, ED: Lactic Acid, Venous: 3.66 mmol/L (ref 0.5–2.0)

## 2014-07-31 MED ORDER — SODIUM CHLORIDE 0.9 % IV BOLUS (SEPSIS)
1000.0000 mL | INTRAVENOUS | Status: AC
  Administered 2014-07-31 (×2): 1000 mL via INTRAVENOUS

## 2014-07-31 MED ORDER — INSULIN ASPART 100 UNIT/ML ~~LOC~~ SOLN
8.0000 [IU] | Freq: Once | SUBCUTANEOUS | Status: AC
  Administered 2014-08-01: 8 [IU] via SUBCUTANEOUS
  Filled 2014-07-31: qty 1

## 2014-07-31 MED ORDER — ACETAMINOPHEN 325 MG PO TABS
650.0000 mg | ORAL_TABLET | Freq: Four times a day (QID) | ORAL | Status: DC | PRN
  Administered 2014-07-31: 650 mg via ORAL
  Filled 2014-07-31: qty 2

## 2014-07-31 MED ORDER — VANCOMYCIN HCL IN DEXTROSE 1-5 GM/200ML-% IV SOLN
1000.0000 mg | Freq: Once | INTRAVENOUS | Status: AC
  Administered 2014-07-31: 1000 mg via INTRAVENOUS
  Filled 2014-07-31: qty 200

## 2014-07-31 MED ORDER — SODIUM CHLORIDE 0.9 % IV BOLUS (SEPSIS)
500.0000 mL | INTRAVENOUS | Status: AC
  Administered 2014-07-31: 500 mL via INTRAVENOUS

## 2014-07-31 MED ORDER — PIPERACILLIN-TAZOBACTAM 3.375 G IVPB
3.3750 g | Freq: Three times a day (TID) | INTRAVENOUS | Status: DC
  Administered 2014-08-01 (×2): 3.375 g via INTRAVENOUS
  Filled 2014-07-31 (×2): qty 50

## 2014-07-31 MED ORDER — VANCOMYCIN HCL IN DEXTROSE 1-5 GM/200ML-% IV SOLN: 1000.0000 mg | INTRAVENOUS | Status: DC

## 2014-07-31 MED ORDER — PIPERACILLIN-TAZOBACTAM 3.375 G IVPB 30 MIN
3.3750 g | Freq: Once | INTRAVENOUS | Status: AC
  Administered 2014-07-31: 3.375 g via INTRAVENOUS
  Filled 2014-07-31: qty 50

## 2014-07-31 NOTE — Progress Notes (Signed)
ANTIBIOTIC CONSULT NOTE - INITIAL  Pharmacy Consult for Vancomycin & Zosyn Indication: Sepsis  No Known Allergies  Patient Measurements: Height: 5\' 10"  (177.8 cm) Weight: 150 lb (68.04 kg) IBW/kg (Calculated) : 73  Vital Signs: Temp: 101.4 F (38.6 C) (03/15 2146) Temp Source: Oral (03/15 2146) BP: 180/87 mmHg (03/15 2146) Pulse Rate: 111 (03/15 2146) Intake/Output from previous day:   Intake/Output from this shift:    Labs:  Recent Labs  07/31/14 2038  WBC 8.6  HGB 15.2  PLT 214  CREATININE 2.08*   Estimated Creatinine Clearance: 34.1 mL/min (by C-G formula based on Cr of 2.08). No results for input(s): VANCOTROUGH, VANCOPEAK, VANCORANDOM, GENTTROUGH, GENTPEAK, GENTRANDOM, TOBRATROUGH, TOBRAPEAK, TOBRARND, AMIKACINPEAK, AMIKACINTROU, AMIKACIN in the last 72 hours.   Microbiology: No results found for this or any previous visit (from the past 720 hour(s)).  Medical History: Past Medical History  Diagnosis Date  . Hypertension   . ED (erectile dysfunction)   . Peripheral neuropathy   . Diabetes mellitus     type I and type II  . Chronic kidney disease     "starting to bother me now" (07/28/2012)    Medications:  Scheduled:   Infusions:  . [START ON 08/01/2014] piperacillin-tazobactam (ZOSYN)  IV    . sodium chloride Stopped (07/31/14 2130)   Followed by  . sodium chloride    . [START ON 08/01/2014] vancomycin     Assessment:  64 yr male with complaint of weakness and cough.  H/O DM.  Tm = 101.7  CrCl ~ 34 ml/min  Blood and urine cultures ordered  Pharmacy consulted to dose Vancomycin and Zosyn for sepsis.  Patient has received initial doses in the ED  Goal of Therapy:  Vancomycin trough level 15-20 mcg/ml  Plan:  Measure antibiotic drug levels at steady state Follow up culture results  Zosyn 3.375gm IV q8h (each dose infused over 4 hrs) Vancomycin 1gm IV q24h  Talisha Erby, Toribio Harbour, PharmD 07/31/2014,10:22 PM

## 2014-07-31 NOTE — ED Notes (Signed)
Pt arrived to the ED wit a complaint of weakness.   Pt at present is shaking slightly and is slow to respond to Nurse questions.  Pt states he has no pain.  Pt states that he is a diabetic and is prescribed insulin but has not taken his medication in a considerable amount of time.  It is believe to be in the range of months that he has not taken his diabetic medication.  Pt also has a cough.

## 2014-07-31 NOTE — ED Notes (Signed)
EDP made aware of patient CG4 lactic result. 

## 2014-07-31 NOTE — ED Notes (Signed)
Bed: [id number] Expected date:  Expected time:  Means of arrival:  Comments: Hold Domenic Polite

## 2014-07-31 NOTE — ED Provider Notes (Signed)
CSN: [place]:[id number]     Arrival date & time 07/31/14  1909 History   First MD Initiated Contact with Patient 07/31/14 1959     Chief Complaint  Patient presents with  . Weakness    HPI Pt started feeling weak as if he would pass out.  This started yesterday.  He has been coughing.  No body aches.  No sore throat or congestion. No vomiting or diarrhea.  No trouble urinating.  Pt has history of diabetes but he stopped taking his medications.  When I asked him why he states he just stopped taking them.  He did not realize that he had a fever today. Past Medical History  Diagnosis Date  . Hypertension   . ED (erectile dysfunction)   . Peripheral neuropathy   . Diabetes mellitus     type I and type II  . Chronic kidney disease     "starting to bother me now" (07/28/2012)   Past Surgical History  Procedure Laterality Date  . Appendectomy  1950's   Family History  Problem Relation Age of Onset  . Hypertension    . Diabetes    . Colon polyps Mother    History  Substance Use Topics  . Smoking status: Former Smoker -- 0.12 packs/day for 10 years    Types: Cigarettes  . Smokeless tobacco: Never Used     Comment: 07/28/2012 "quit smoking cigarettes 20-30 yr ago"  . Alcohol Use: 2.4 oz/week    4 Cans of beer per week     Comment: daily to less than one a month    Review of Systems  Constitutional: Positive for fever.  Respiratory: Positive for cough. Negative for shortness of breath.   Cardiovascular: Negative for chest pain.  Genitourinary: Negative for dysuria.  Neurological: Positive for tremors.  All other systems reviewed and are negative.     Allergies  Review of patient's allergies indicates no known allergies.  Home Medications   Prior to Admission medications   Medication Sig Start Date End Date Taking? Authorizing Provider  amitriptyline (ELAVIL) 25 MG tablet take 1 tablet by mouth at bedtime 12/16/12  Yes Dorena Cookey, MD  aspirin 325 MG tablet Take 325 mg by  mouth daily.     Yes Historical Provider, MD  atenolol (TENORMIN) 50 MG tablet take 2 tablets by mouth every morning and 1 tablet by mouth every evening 08/01/12  Yes Dorena Cookey, MD  glipiZIDE (GLUCOTROL) 10 MG tablet take 1 tablet by mouth twice a day 08/01/12  Yes Dorena Cookey, MD  glucose blood (ACCU-CHEK AVIVA) test strip Use as instructed 06/23/10  Yes Dorena Cookey, MD  hydrochlorothiazide (HYDRODIURIL) 25 MG tablet take 1 tablet by mouth once daily 08/13/12  Yes Dorena Cookey, MD  LANTUS SOLOSTAR 100 UNIT/ML injection INJECT 15 UNITS SUBCUTANEOUSLY TWICE A DAY 08/07/12  Yes Dorena Cookey, MD  losartan (COZAAR) 100 MG tablet take 1 tablet by mouth once daily 08/13/12  Yes Dorena Cookey, MD  metFORMIN (GLUCOPHAGE) 1000 MG tablet take 1 tablet by mouth twice a day with food 08/07/12  Yes Dorena Cookey, MD  amLODipine (NORVASC) 10 MG tablet Take 1 tablet (10 mg total) by mouth daily. 10/14/11 10/13/12  Dorena Cookey, MD   BP 132/73 mmHg  Pulse 106  Temp(Src) 101.4 F (38.6 C) (Oral)  Resp 14  Ht 5\' 10"  (1.778 m)  Wt 150 lb (68.04 kg)  BMI 21.52 kg/m2  SpO2 96% Physical  Exam  Constitutional: He appears listless. No distress.  HENT:  Head: Normocephalic and atraumatic.  Right Ear: External ear normal.  Left Ear: External ear normal.  Eyes: Conjunctivae are normal. Right eye exhibits no discharge. Left eye exhibits no discharge. No scleral icterus.  Neck: Neck supple. No tracheal deviation present.  Cardiovascular: Regular rhythm and intact distal pulses.  Tachycardia present.   Pulmonary/Chest: Effort normal and breath sounds normal. No stridor. No respiratory distress. He has no wheezes. He has no rales.  Abdominal: Soft. Bowel sounds are normal. He exhibits no distension. There is no tenderness. There is no rebound and no guarding.  Musculoskeletal: He exhibits no edema or tenderness.  Neurological: He appears listless. No cranial nerve deficit (no facial droop, extraocular  movements intact, no slurred speech) or sensory deficit. He exhibits normal muscle tone. He displays no seizure activity. Coordination normal.  Generalized weakness but is able to lift arms and legs off the bed  Skin: Skin is warm and dry. No rash noted. He is not diaphoretic.  Psychiatric: He has a normal mood and affect.  Nursing note and vitals reviewed.   ED Course  Procedures (including critical care time) Labs Review Labs Reviewed  CBC WITH DIFFERENTIAL/PLATELET - Abnormal; Notable for the following:    Neutrophils Relative % 79 (*)    Lymphocytes Relative 8 (*)    Monocytes Relative 13 (*)    Monocytes Absolute 1.1 (*)    All other components within normal limits  COMPREHENSIVE METABOLIC PANEL - Abnormal; Notable for the following:    Sodium 134 (*)    Glucose, Bld 475 (*)    BUN 34 (*)    Creatinine, Ser 2.08 (*)    GFR calc non Af Amer 32 (*)    GFR calc Af Amer 37 (*)    All other components within normal limits  URINALYSIS, ROUTINE W REFLEX MICROSCOPIC - Abnormal; Notable for the following:    APPearance CLOUDY (*)    Specific Gravity, Urine 1.034 (*)    Glucose, UA >1000 (*)    Hgb urine dipstick TRACE (*)    Protein, ur 100 (*)    All other components within normal limits  URINE MICROSCOPIC-ADD ON - Abnormal; Notable for the following:    Casts HYALINE CASTS (*)    All other components within normal limits  CBG MONITORING, ED - Abnormal; Notable for the following:    Glucose-Capillary 470 (*)    All other components within normal limits  I-STAT CG4 LACTIC ACID, ED - Abnormal; Notable for the following:    Lactic Acid, Venous 3.66 (*)    All other components within normal limits  CULTURE, BLOOD (ROUTINE X 2)  CULTURE, BLOOD (ROUTINE X 2)  URINE CULTURE  INFLUENZA PANEL BY PCR (TYPE A & B, H1N1)    Imaging Review Dg Chest 2 View  07/31/2014   CLINICAL DATA:  Cough and weakness  EXAM: CHEST  2 VIEW  COMPARISON:  07/28/2012  FINDINGS: The heart size and  mediastinal contours are within normal limits. Both lungs are clear. The visualized skeletal structures are unremarkable.  IMPRESSION: No active cardiopulmonary disease.   Electronically Signed   By: Andreas Newport M.D.   On: 07/31/2014 21:35     EKG Interpretation   Date/Time:  Tuesday July 31 2014 20:27:38 EDT Ventricular Rate:  117 PR Interval:  119 QRS Duration: 67 QT Interval:  303 QTC Calculation: 423 R Axis:   79 Text Interpretation:  Sinus tachycardia Multiform  ventricular premature  complexes Aberrant complex Biatrial enlargement Probable left ventricular  hypertrophy , new sinec last tracing Baseline wander in lead(s) V2 V5 V6  Since last tracing rate faster increased t wave amplitude Confirmed by  Rhylen Shaheen  MD-J, Randle Shatzer [id number]) on 07/31/2014 8:29:41 PM     Medications  acetaminophen (TYLENOL) tablet 650 mg (650 mg Oral Given 07/31/14 2129)  sodium chloride 0.9 % bolus 1,000 mL (1,000 mLs Intravenous Restarted 07/31/14 2327)    Followed by  sodium chloride 0.9 % bolus 500 mL (0 mLs Intravenous Stopped 07/31/14 2322)  vancomycin (VANCOCIN) IVPB 1000 mg/200 mL premix (not administered)  piperacillin-tazobactam (ZOSYN) IVPB 3.375 g (not administered)  insulin aspart (novoLOG) injection 8 Units (not administered)  piperacillin-tazobactam (ZOSYN) IVPB 3.375 g (0 g Intravenous Stopped 07/31/14 2120)  vancomycin (VANCOCIN) IVPB 1000 mg/200 mL premix (0 mg Intravenous Stopped 07/31/14 2223)    MDM   Final diagnoses:  Hyperglycemia  Acute renal failure, unspecified acute renal failure type  Bronchitis   The patient has fever associated with hyperglycemia. he is tachycardic with an elevated lactic acid level. Chest x-ray does not show pneumonia. Urinalysis does not show a urinary tract infection. Patient was empirically started on antibiotics but it is possible the symptoms may be related to a viral upper respiratory infection. Add on influenza panel. Considering his abnormal renal  function and elevated lactic acid level, I'll consult with medical service regarding admission for further treatment.     Dorie Rank, MD 07/31/14 [phone]

## 2014-08-01 ENCOUNTER — Encounter (HOSPITAL_COMMUNITY): Payer: Self-pay | Admitting: Internal Medicine

## 2014-08-01 DIAGNOSIS — A419 Sepsis, unspecified organism: Secondary | ICD-10-CM | POA: Diagnosis present

## 2014-08-01 DIAGNOSIS — N189 Chronic kidney disease, unspecified: Secondary | ICD-10-CM | POA: Diagnosis present

## 2014-08-01 DIAGNOSIS — J111 Influenza due to unidentified influenza virus with other respiratory manifestations: Secondary | ICD-10-CM | POA: Diagnosis present

## 2014-08-01 DIAGNOSIS — N179 Acute kidney failure, unspecified: Secondary | ICD-10-CM

## 2014-08-01 DIAGNOSIS — Z9119 Patient's noncompliance with other medical treatment and regimen: Secondary | ICD-10-CM | POA: Diagnosis present

## 2014-08-01 DIAGNOSIS — G629 Polyneuropathy, unspecified: Secondary | ICD-10-CM | POA: Diagnosis present

## 2014-08-01 DIAGNOSIS — E1165 Type 2 diabetes mellitus with hyperglycemia: Secondary | ICD-10-CM | POA: Diagnosis present

## 2014-08-01 DIAGNOSIS — R6510 Systemic inflammatory response syndrome (SIRS) of non-infectious origin without acute organ dysfunction: Secondary | ICD-10-CM | POA: Diagnosis present

## 2014-08-01 DIAGNOSIS — J40 Bronchitis, not specified as acute or chronic: Secondary | ICD-10-CM | POA: Diagnosis present

## 2014-08-01 DIAGNOSIS — N529 Male erectile dysfunction, unspecified: Secondary | ICD-10-CM | POA: Diagnosis present

## 2014-08-01 DIAGNOSIS — R739 Hyperglycemia, unspecified: Secondary | ICD-10-CM

## 2014-08-01 DIAGNOSIS — E1100 Type 2 diabetes mellitus with hyperosmolarity without nonketotic hyperglycemic-hyperosmolar coma (NKHHC): Secondary | ICD-10-CM | POA: Diagnosis present

## 2014-08-01 DIAGNOSIS — Z87891 Personal history of nicotine dependence: Secondary | ICD-10-CM | POA: Diagnosis not present

## 2014-08-01 DIAGNOSIS — I129 Hypertensive chronic kidney disease with stage 1 through stage 4 chronic kidney disease, or unspecified chronic kidney disease: Secondary | ICD-10-CM | POA: Diagnosis present

## 2014-08-01 LAB — COMPREHENSIVE METABOLIC PANEL
ALT: 21 U/L (ref 0–53)
AST: 29 U/L (ref 0–37)
Albumin: 3 g/dL — ABNORMAL LOW (ref 3.5–5.2)
Alkaline Phosphatase: 60 U/L (ref 39–117)
Anion gap: 9 (ref 5–15)
BILIRUBIN TOTAL: 0.7 mg/dL (ref 0.3–1.2)
BUN: 39 mg/dL — ABNORMAL HIGH (ref 6–23)
CO2: 29 mmol/L (ref 19–32)
CREATININE: 1.83 mg/dL — AB (ref 0.50–1.35)
Calcium: 8.8 mg/dL (ref 8.4–10.5)
Chloride: 102 mmol/L (ref 96–112)
GFR calc Af Amer: 43 mL/min — ABNORMAL LOW (ref >90)
GFR calc non Af Amer: 37 mL/min — ABNORMAL LOW (ref >90)
Glucose, Bld: 308 mg/dL — ABNORMAL HIGH (ref 70–99)
Potassium: 4.5 mmol/L (ref 3.5–5.1)
Sodium: 140 mmol/L (ref 135–145)
Total Protein: 6.1 g/dL (ref 6.0–8.3)

## 2014-08-01 LAB — CBG MONITORING, ED
GLUCOSE-CAPILLARY: 223 mg/dL — AB (ref 70–99)
GLUCOSE-CAPILLARY: 328 mg/dL — AB (ref 70–99)
GLUCOSE-CAPILLARY: 397 mg/dL — AB (ref 70–99)
Glucose-Capillary: 160 mg/dL — ABNORMAL HIGH (ref 70–99)
Glucose-Capillary: 251 mg/dL — ABNORMAL HIGH (ref 70–99)

## 2014-08-01 LAB — INFLUENZA PANEL BY PCR (TYPE A & B)
H1N1 flu by pcr: DETECTED — AB
INFLAPCR: POSITIVE — AB
Influenza B By PCR: NEGATIVE

## 2014-08-01 LAB — CBC
HCT: 38.1 % — ABNORMAL LOW (ref 39.0–52.0)
Hemoglobin: 12.9 g/dL — ABNORMAL LOW (ref 13.0–17.0)
MCH: 29.4 pg (ref 26.0–34.0)
MCHC: 33.9 g/dL (ref 30.0–36.0)
MCV: 86.8 fL (ref 78.0–100.0)
Platelets: 197 10*3/uL (ref 150–400)
RBC: 4.39 MIL/uL (ref 4.22–5.81)
RDW: 11.8 % (ref 11.5–15.5)
WBC: 5.5 10*3/uL (ref 4.0–10.5)

## 2014-08-01 LAB — MAGNESIUM: Magnesium: 2.1 mg/dL (ref 1.5–2.5)

## 2014-08-01 LAB — GLUCOSE, CAPILLARY
GLUCOSE-CAPILLARY: 264 mg/dL — AB (ref 70–99)
Glucose-Capillary: 241 mg/dL — ABNORMAL HIGH (ref 70–99)
Glucose-Capillary: 213 mg/dL — ABNORMAL HIGH (ref 70–99)

## 2014-08-01 LAB — LACTIC ACID, PLASMA: Lactic Acid, Venous: 1.3 mmol/L (ref 0.5–2.0)

## 2014-08-01 LAB — PHOSPHORUS: PHOSPHORUS: 3.9 mg/dL (ref 2.3–4.6)

## 2014-08-01 LAB — TSH: TSH: 0.476 u[IU]/mL (ref 0.350–4.500)

## 2014-08-01 MED ORDER — ONDANSETRON HCL 4 MG PO TABS: 4.0000 mg | ORAL_TABLET | Freq: Four times a day (QID) | ORAL | Status: DC | PRN

## 2014-08-01 MED ORDER — ONDANSETRON HCL 4 MG/2ML IJ SOLN: 4.0000 mg | Freq: Four times a day (QID) | INTRAMUSCULAR | Status: DC | PRN

## 2014-08-01 MED ORDER — OSELTAMIVIR PHOSPHATE 30 MG PO CAPS
30.0000 mg | ORAL_CAPSULE | Freq: Two times a day (BID) | ORAL | Status: DC
Start: 2014-08-01 — End: 2014-08-02
  Administered 2014-08-01 – 2014-08-02 (×3): 30 mg via ORAL
  Filled 2014-08-01 (×4): qty 1

## 2014-08-01 MED ORDER — ACETAMINOPHEN 325 MG PO TABS
650.0000 mg | ORAL_TABLET | Freq: Four times a day (QID) | ORAL | Status: DC | PRN
  Administered 2014-08-01: 650 mg via ORAL
  Filled 2014-08-01: qty 2

## 2014-08-01 MED ORDER — ENOXAPARIN SODIUM 40 MG/0.4ML ~~LOC~~ SOLN
40.0000 mg | SUBCUTANEOUS | Status: DC
  Administered 2014-08-01: 40 mg via SUBCUTANEOUS
  Filled 2014-08-01 (×2): qty 0.4

## 2014-08-01 MED ORDER — SODIUM CHLORIDE 0.9 % IV BOLUS (SEPSIS): 500.0000 mL | INTRAVENOUS | Status: AC

## 2014-08-01 MED ORDER — ASPIRIN 325 MG PO TABS
325.0000 mg | ORAL_TABLET | Freq: Every day | ORAL | Status: DC
  Administered 2014-08-01 – 2014-08-02 (×2): 325 mg via ORAL
  Filled 2014-08-01 (×2): qty 1

## 2014-08-01 MED ORDER — INSULIN GLARGINE 100 UNIT/ML ~~LOC~~ SOLN
15.0000 [IU] | Freq: Two times a day (BID) | SUBCUTANEOUS | Status: DC
  Filled 2014-08-01: qty 0.15

## 2014-08-01 MED ORDER — INSULIN ASPART 100 UNIT/ML ~~LOC~~ SOLN
0.0000 [IU] | Freq: Every day | SUBCUTANEOUS | Status: DC
  Administered 2014-08-01: 2 [IU] via SUBCUTANEOUS

## 2014-08-01 MED ORDER — HYDROCODONE-ACETAMINOPHEN 5-325 MG PO TABS: 1.0000 | ORAL_TABLET | ORAL | Status: DC | PRN

## 2014-08-01 MED ORDER — GUAIFENESIN ER 600 MG PO TB12
600.0000 mg | ORAL_TABLET | Freq: Two times a day (BID) | ORAL | Status: DC
  Administered 2014-08-01 – 2014-08-02 (×3): 600 mg via ORAL
  Filled 2014-08-01 (×4): qty 1

## 2014-08-01 MED ORDER — LEVOFLOXACIN IN D5W 750 MG/150ML IV SOLN
750.0000 mg | INTRAVENOUS | Status: DC
  Administered 2014-08-01: 750 mg via INTRAVENOUS
  Filled 2014-08-01: qty 150

## 2014-08-01 MED ORDER — INSULIN GLARGINE 100 UNIT/ML ~~LOC~~ SOLN
15.0000 [IU] | Freq: Every day | SUBCUTANEOUS | Status: DC
  Administered 2014-08-01: 15 [IU] via SUBCUTANEOUS
  Filled 2014-08-01: qty 0.15

## 2014-08-01 MED ORDER — INSULIN GLARGINE 100 UNIT/ML ~~LOC~~ SOLN
15.0000 [IU] | Freq: Two times a day (BID) | SUBCUTANEOUS | Status: DC
  Administered 2014-08-01: 15 [IU] via SUBCUTANEOUS
  Filled 2014-08-01 (×2): qty 0.15

## 2014-08-01 MED ORDER — INSULIN ASPART 100 UNIT/ML ~~LOC~~ SOLN
0.0000 [IU] | SUBCUTANEOUS | Status: DC
  Administered 2014-08-01: 7 [IU] via SUBCUTANEOUS
  Filled 2014-08-01: qty 1

## 2014-08-01 MED ORDER — ACETAMINOPHEN 650 MG RE SUPP: 650.0000 mg | Freq: Four times a day (QID) | RECTAL | Status: DC | PRN

## 2014-08-01 MED ORDER — SODIUM CHLORIDE 0.9 % IJ SOLN
3.0000 mL | Freq: Two times a day (BID) | INTRAMUSCULAR | Status: DC
Start: 2014-08-01 — End: 2014-08-02

## 2014-08-01 MED ORDER — INSULIN ASPART 100 UNIT/ML ~~LOC~~ SOLN
0.0000 [IU] | Freq: Three times a day (TID) | SUBCUTANEOUS | Status: DC
  Administered 2014-08-01: 5 [IU] via SUBCUTANEOUS
  Administered 2014-08-01: 8 [IU] via SUBCUTANEOUS
  Administered 2014-08-02: 5 [IU] via SUBCUTANEOUS
  Filled 2014-08-01: qty 1

## 2014-08-01 MED ORDER — ALBUTEROL SULFATE (2.5 MG/3ML) 0.083% IN NEBU: 2.5000 mg | INHALATION_SOLUTION | RESPIRATORY_TRACT | Status: DC | PRN

## 2014-08-01 MED ORDER — SODIUM CHLORIDE 0.9 % IV SOLN
INTRAVENOUS | Status: AC
  Administered 2014-08-01 (×2): via INTRAVENOUS

## 2014-08-01 NOTE — Progress Notes (Signed)
ANTIBIOTIC CONSULT NOTE   Pharmacy Consult for Vancomycin & Zosyn & levofloxacin Indication: Sepsis  No Known Allergies  Patient Measurements: Height: 5\' 10"  (177.8 cm) Weight: 150 lb (68.04 kg) IBW/kg (Calculated) : 73  Vital Signs: Temp Source: Oral (03/16 1253) BP: 139/74 mmHg (03/16 1253) Pulse Rate: 100 (03/16 1253) Intake/Output from previous day: 03/15 0701 - 03/16 0700 In: 2300 [I.V.:2300] Out: -  Intake/Output from this shift: Total I/O In: 240 [P.O.:240] Out: -   Labs:  Recent Labs  07/31/14 2038  WBC 8.6  HGB 15.2  PLT 214  CREATININE 2.08*   Estimated Creatinine Clearance: 34.1 mL/min (by C-G formula based on Cr of 2.08). No results for input(s): VANCOTROUGH, VANCOPEAK, VANCORANDOM, GENTTROUGH, GENTPEAK, GENTRANDOM, TOBRATROUGH, TOBRAPEAK, TOBRARND, AMIKACINPEAK, AMIKACINTROU, AMIKACIN in the last 72 hours.   Microbiology: No results found for this or any previous visit (from the past 720 hour(s)).  Medical History: Past Medical History  Diagnosis Date  . Hypertension   . ED (erectile dysfunction)   . Peripheral neuropathy   . Diabetes mellitus     type I and type II  . Chronic kidney disease     "starting to bother me now" (07/28/2012)    Medications:  Scheduled:  . aspirin  325 mg Oral Daily  . guaiFENesin  600 mg Oral BID  . insulin aspart  0-15 Units Subcutaneous TID WC  . insulin aspart  0-5 Units Subcutaneous QHS  . insulin glargine  15 Units Subcutaneous Daily  . oseltamivir  30 mg Oral BID   Infusions:  . sodium chloride 75 mL/hr at 08/01/14 1015  . piperacillin-tazobactam (ZOSYN)  IV 3.375 g (08/01/14 1219)  . vancomycin     Assessment: 64 yr male with complaint of weakness and cough. H/O DM. Pharmacy consulted to dose Vancomycin and Zosyn for sepsis. Patient has received initial doses in the ED. Levofloxacin added by admitting team. Discussed with Dr. Eliseo Squires re: antibiotic selections, will stop vanco/zosyn and continue  levofloxacin based on + influenza PCR.   315 >> Vanc >> 3/16 3/15 >> Zosyn >> 3/16 3/16 >> levofloxacin >>  Tmax: Fever resolving WBCs: 8.6 Renal: 2.08 with CrCl ~ 34 ml/min  3/15 blood x2: 3/15 urine:  3/15 influenza: positive  Goal of Therapy:  Vancomycin trough level 15-20 mcg/ml  Plan:   Stop vancomycin and Zosyn (per d/w Dr Eliseo Squires)  Levofloxacin 750mg  IV q48h  Follow renal function and adjust dose as appropriate  Doreene Eland, PharmD, BCPS.   Pager: [phone] 08/01/2014,1:44 PM

## 2014-08-01 NOTE — ED Notes (Signed)
CBG registered 328 on ED Glucometer

## 2014-08-01 NOTE — H&P (Signed)
PCP:  Joycelyn Man, MD    Chief Complaint:  fatigue  HPI: [name] [name] is a 64 y.o. male   has a past medical history of Hypertension; ED (erectile dysfunction); Peripheral neuropathy; Diabetes mellitus; and Chronic kidney disease.   Presented with  Patient reports feeling like his legs keep buckling. He has been coughing for the past few days. Denis muscle aches. He has been exposed to his sick grandchild. Denies any nausea vomitign or diarrhea. He reports increase in urination, blurred vision. States not been taking his insulin for long time. Maybe years. BG on arrival was 475, Cr up to 2.08. Reports not taking any of his medications and have not had follow up with ix PCP.   Hospitalist was called for admission for SIRS  Review of Systems:    Pertinent positives include:  Fevers, chills, fatigue, Constitutional:  No weight loss, night sweats,  weight loss  HEENT:  No headaches, Difficulty swallowing,Tooth/dental problems,Sore throat,  No sneezing, itching, ear ache, nasal congestion, post nasal drip,  Cardio-vascular:  No chest pain, Orthopnea, PND, anasarca, dizziness, palpitations.no Bilateral lower extremity swelling  GI:  No heartburn, indigestion, abdominal pain, nausea, vomiting, diarrhea, change in bowel habits, loss of appetite, melena, blood in stool, hematemesis Resp:  no shortness of breath at rest. No dyspnea on exertion, No excess mucus, no productive cough, No non-productive cough, No coughing up of blood.No change in color of mucus.No wheezing. Skin:  no rash or lesions. No jaundice GU:  no dysuria, change in color of urine, no urgency or frequency. No straining to urinate.  No flank pain.  Musculoskeletal:  No joint pain or no joint swelling. No decreased range of motion. No back pain.  Psych:  No change in mood or affect. No depression or anxiety. No memory loss.  Neuro: no localizing neurological complaints, no tingling, no weakness, no double  vision, no gait abnormality, no slurred speech, no confusion  Otherwise ROS are negative except for above, 10 systems were reviewed  Past Medical History: Past Medical History  Diagnosis Date  . Hypertension   . ED (erectile dysfunction)   . Peripheral neuropathy   . Diabetes mellitus     type I and type II  . Chronic kidney disease     "starting to bother me now" (07/28/2012)   Past Surgical History  Procedure Laterality Date  . Appendectomy  1950's     Medications: Prior to Admission medications   Medication Sig Start Date End Date Taking? Authorizing Provider  amitriptyline (ELAVIL) 25 MG tablet take 1 tablet by mouth at bedtime 12/16/12  Yes Dorena Cookey, MD  aspirin 325 MG tablet Take 325 mg by mouth daily.     Yes Historical Provider, MD  atenolol (TENORMIN) 50 MG tablet take 2 tablets by mouth every morning and 1 tablet by mouth every evening 08/01/12  Yes Dorena Cookey, MD  glipiZIDE (GLUCOTROL) 10 MG tablet take 1 tablet by mouth twice a day 08/01/12  Yes Dorena Cookey, MD  glucose blood (ACCU-CHEK AVIVA) test strip Use as instructed 06/23/10  Yes Dorena Cookey, MD  hydrochlorothiazide (HYDRODIURIL) 25 MG tablet take 1 tablet by mouth once daily 08/13/12  Yes Dorena Cookey, MD  LANTUS SOLOSTAR 100 UNIT/ML injection INJECT 15 UNITS SUBCUTANEOUSLY TWICE A DAY 08/07/12  Yes Dorena Cookey, MD  losartan (COZAAR) 100 MG tablet take 1 tablet by mouth once daily 08/13/12  Yes Dorena Cookey, MD  metFORMIN (GLUCOPHAGE) 1000  MG tablet take 1 tablet by mouth twice a day with food 08/07/12  Yes Dorena Cookey, MD  amLODipine (NORVASC) 10 MG tablet Take 1 tablet (10 mg total) by mouth daily. 10/14/11 10/13/12  Dorena Cookey, MD    Allergies:  No Known Allergies  Social History:  Ambulatory  independently  Lives at home  With family     reports that he has quit smoking. His smoking use included Cigarettes. He has a 1.2 pack-year smoking history. He has never used smokeless tobacco.  He reports that he drinks about 2.4 oz of alcohol per week. He reports that he does not use illicit drugs.    Family History: family history includes Colon polyps in his mother; Diabetes in an other family member; Hypertension in an other family member.    Physical Exam: Patient Vitals for the past 24 hrs:  BP Temp Temp src Pulse Resp SpO2 Height Weight  07/31/14 2200 132/73 mmHg - - 106 - 96 % - -  07/31/14 2146 180/87 mmHg 101.4 F (38.6 C) Oral 111 14 98 % - -  07/31/14 2034 - - - - - - 5\' 10"  (1.778 m) -  07/31/14 1928 (!) 158/115 mmHg 101.7 F (38.7 C) Oral 116 20 94 % - 68.04 kg (150 lb)    1. General:  in No Acute distress 2. Psychological: slightly lethargic but  Oriented 3. Head/ENT:     Dry Mucous Membranes                          Head Non traumatic, neck supple                          Normal   Dentition 4. SKIN:  decreased Skin turgor,  Skin clean Dry and intact no rash 5. Heart: Regular rate and rhythm no Murmur, Rub or gallop 6. Lungs:  no wheezes some crackles   7. Abdomen: Soft, non-tender, Non distended 8. Lower extremities: no clubbing, cyanosis, trace edema 9. Neurologically Grossly intact, moving all 4 extremities equally 10. MSK: Normal range of motion  body mass index is 21.52 kg/(m^2).   Labs on Admission:   Results for orders placed or performed during the hospital encounter of 07/31/14 (from the past 24 hour(s))  CBG monitoring, ED     Status: Abnormal   Collection Time: 07/31/14  7:28 PM  Result Value Ref Range   Glucose-Capillary 470 (H) 70 - 99 mg/dL   Comment 1 Notify RN    Comment 2 Document in Chart   I-Stat CG4 Lactic Acid, ED     Status: Abnormal   Collection Time: 07/31/14  8:09 PM  Result Value Ref Range   Lactic Acid, Venous 3.66 (HH) 0.5 - 2.0 mmol/L   Comment NOTIFIED PHYSICIAN   CBC WITH DIFFERENTIAL     Status: Abnormal   Collection Time: 07/31/14  8:38 PM  Result Value Ref Range   WBC 8.6 4.0 - 10.5 K/uL   RBC 5.15 4.22 -  5.81 MIL/uL   Hemoglobin 15.2 13.0 - 17.0 g/dL   HCT 44.1 39.0 - 52.0 %   MCV 85.6 78.0 - 100.0 fL   MCH 29.5 26.0 - 34.0 pg   MCHC 34.5 30.0 - 36.0 g/dL   RDW 11.8 11.5 - 15.5 %   Platelets 214 150 - 400 K/uL   Neutrophils Relative % 79 (H) 43 - 77 %  Neutro Abs 6.7 1.7 - 7.7 K/uL   Lymphocytes Relative 8 (L) 12 - 46 %   Lymphs Abs 0.7 0.7 - 4.0 K/uL   Monocytes Relative 13 (H) 3 - 12 %   Monocytes Absolute 1.1 (H) 0.1 - 1.0 K/uL   Eosinophils Relative 0 0 - 5 %   Eosinophils Absolute 0.0 0.0 - 0.7 K/uL   Basophils Relative 0 0 - 1 %   Basophils Absolute 0.0 0.0 - 0.1 K/uL  Comprehensive metabolic panel     Status: Abnormal   Collection Time: 07/31/14  8:38 PM  Result Value Ref Range   Sodium 134 (L) 135 - 145 mmol/L   Potassium 4.9 3.5 - 5.1 mmol/L   Chloride 96 96 - 112 mmol/L   CO2 23 19 - 32 mmol/L   Glucose, Bld 475 (H) 70 - 99 mg/dL   BUN 34 (H) 6 - 23 mg/dL   Creatinine, Ser 2.08 (H) 0.50 - 1.35 mg/dL   Calcium 10.2 8.4 - 10.5 mg/dL   Total Protein 7.8 6.0 - 8.3 g/dL   Albumin 3.9 3.5 - 5.2 g/dL   AST 26 0 - 37 U/L   ALT 25 0 - 53 U/L   Alkaline Phosphatase 80 39 - 117 U/L   Total Bilirubin 0.9 0.3 - 1.2 mg/dL   GFR calc non Af Amer 32 (L) >90 mL/min   GFR calc Af Amer 37 (L) >90 mL/min   Anion gap 15 5 - 15  Urinalysis, Routine w reflex microscopic     Status: Abnormal   Collection Time: 07/31/14  9:30 PM  Result Value Ref Range   Color, Urine YELLOW YELLOW   APPearance CLOUDY (A) CLEAR   Specific Gravity, Urine 1.034 (H) 1.005 - 1.030   pH 5.0 5.0 - 8.0   Glucose, UA >1000 (A) NEGATIVE mg/dL   Hgb urine dipstick TRACE (A) NEGATIVE   Bilirubin Urine NEGATIVE NEGATIVE   Ketones, ur NEGATIVE NEGATIVE mg/dL   Protein, ur 100 (A) NEGATIVE mg/dL   Urobilinogen, UA 0.2 0.0 - 1.0 mg/dL   Nitrite NEGATIVE NEGATIVE   Leukocytes, UA NEGATIVE NEGATIVE  Urine microscopic-add on     Status: Abnormal   Collection Time: 07/31/14  9:30 PM  Result Value Ref Range    WBC, UA 0-2 <3 WBC/hpf   Bacteria, UA RARE RARE   Casts HYALINE CASTS (A) NEGATIVE    UA concentrated, >1000 glucose  Lab Results  Component Value Date   HGBA1C 15.9* 07/28/2012    Estimated Creatinine Clearance: 34.1 mL/min (by C-G formula based on Cr of 2.08).  BNP (last 3 results) No results for input(s): PROBNP in the last 8760 hours.  Other results:  I have pearsonaly reviewed this: ECG REPORT  Rate: 117  Rhythm: ST ST&T Change: evidence of hypertrophy   Filed Weights   07/31/14 1928  Weight: 68.04 kg (150 lb)     Cultures: No results found for: SDES, [place], CULT, REPTSTATUS   Radiological Exams on Admission: Dg Chest 2 View  07/31/2014   CLINICAL DATA:  Cough and weakness  EXAM: CHEST  2 VIEW  COMPARISON:  07/28/2012  FINDINGS: The heart size and mediastinal contours are within normal limits. Both lungs are clear. The visualized skeletal structures are unremarkable.  IMPRESSION: No active cardiopulmonary disease.   Electronically Signed   By: Andreas Newport M.D.   On: 07/31/2014 21:35    Chart has been reviewed  Assessment/Plan  64 year old gentleman with history of hypertension and  diabetes presents with SIRS possible viral  Present on Admission:  . SIRS- possibly viral etiology. Patient endorses cough chest x-ray at this point unremarkable. But given clinical symptoms worrisome for either early pneumonia and bronchitis. We'll obtain influenza PCR. Her cultures. We will cover with IV antibiotics  . Acute kidney failure obtain urine electrolytes continued to full creatinine  . DM hyperosmolarity type II, uncontrolled - patient with uncontrolled diabetes secondary to medical noncompliance. Will restart Lantus. At this point patient's poor by mouth intake will not restart metformin or glipizide. Patient will benefit from diabetes education. We'll do frequent blood sugar checks and write for sliding scale   Prophylaxis: Lovenox, Protonix  CODE STATUS:   FULL CODE  Other plan as per orders.  I have spent a total of 55 min on this admission  Hana Trippett 08/01/2014, 12:07 AM  [facility]  Pager [phone]   after 2 AM please page floor coverage PA If 7AM-7PM, please contact the day team taking care of the patient  [email or web address]  Password [email or web address]

## 2014-08-01 NOTE — Progress Notes (Addendum)
Patient admitted after midnight, please see H&P. A/P:  SIRS- possibly viral etiology.  -Patient endorses cough chest x-ray at this point unremarkable.  - influenza PCR +-- tamiflu -cultures.  -IV antibiotics   Acute kidney failure - IVF  -baseline about 1.5  Lactic acidosis -IVF and recheck   DM hyperosmolarity type II, uncontrolled - - Lantus.  -ssi  Eulogio Bear DO

## 2014-08-02 DIAGNOSIS — J111 Influenza due to unidentified influenza virus with other respiratory manifestations: Secondary | ICD-10-CM

## 2014-08-02 LAB — BASIC METABOLIC PANEL
ANION GAP: 9 (ref 5–15)
BUN: 37 mg/dL — ABNORMAL HIGH (ref 6–23)
CO2: 26 mmol/L (ref 19–32)
CREATININE: 1.68 mg/dL — AB (ref 0.50–1.35)
Calcium: 8.5 mg/dL (ref 8.4–10.5)
Chloride: 105 mmol/L (ref 96–112)
GFR calc non Af Amer: 41 mL/min — ABNORMAL LOW (ref >90)
GFR, EST AFRICAN AMERICAN: 48 mL/min — AB (ref >90)
GLUCOSE: 61 mg/dL — AB (ref 70–99)
POTASSIUM: 3.8 mmol/L (ref 3.5–5.1)
SODIUM: 140 mmol/L (ref 135–145)

## 2014-08-02 LAB — CBC
HCT: 35.4 % — ABNORMAL LOW (ref 39.0–52.0)
Hemoglobin: 11.9 g/dL — ABNORMAL LOW (ref 13.0–17.0)
MCH: 29 pg (ref 26.0–34.0)
MCHC: 33.6 g/dL (ref 30.0–36.0)
MCV: 86.1 fL (ref 78.0–100.0)
PLATELETS: 168 10*3/uL (ref 150–400)
RBC: 4.11 MIL/uL — ABNORMAL LOW (ref 4.22–5.81)
RDW: 11.7 % (ref 11.5–15.5)
WBC: 5 10*3/uL (ref 4.0–10.5)

## 2014-08-02 LAB — LACTIC ACID, PLASMA: LACTIC ACID, VENOUS: 0.8 mmol/L (ref 0.5–2.0)

## 2014-08-02 LAB — URINE CULTURE
Colony Count: NO GROWTH
Culture: NO GROWTH

## 2014-08-02 LAB — CREATININE, URINE, RANDOM: Creatinine, Urine: 78 mg/dL

## 2014-08-02 LAB — SODIUM, URINE, RANDOM: SODIUM UR: 64 meq/L

## 2014-08-02 LAB — GLUCOSE, CAPILLARY
GLUCOSE-CAPILLARY: 114 mg/dL — AB (ref 70–99)
GLUCOSE-CAPILLARY: 242 mg/dL — AB (ref 70–99)
Glucose-Capillary: 48 mg/dL — ABNORMAL LOW (ref 70–99)

## 2014-08-02 MED ORDER — OSELTAMIVIR PHOSPHATE 30 MG PO CAPS: 30.0000 mg | ORAL_CAPSULE | Freq: Two times a day (BID) | ORAL | Status: DC

## 2014-08-02 MED ORDER — LANTUS SOLOSTAR 100 UNIT/ML ~~LOC~~ SOPN: 10.0000 [IU] | PEN_INJECTOR | Freq: Two times a day (BID) | SUBCUTANEOUS | Status: DC

## 2014-08-02 NOTE — Discharge Summary (Signed)
Physician Discharge Summary  [name] [name] [id number] DOB: 17-Jan-1951 DOA: 07/31/2014  PCP: Joycelyn Man, MD  Admit date: 07/31/2014 Discharge date: 08/02/2014  Time spent: 35 minutes  Recommendations for Outpatient Follow-up:  1. Patient has not been taking meds as prescribed, modified meds to what patient had in hospital and wife will check blood sugars and blood pressures at home and bring to PCP  Discharge Diagnoses:  Active Problems:   Acute kidney failure   Sepsis   SIRS (systemic inflammatory response syndrome)   Hyperglycemia   DM hyperosmolarity type II, uncontrolled   Discharge Condition: improved  Diet recommendation: cardiac/diabetic  Filed Weights   07/31/14 1928  Weight: 68.04 kg (150 lb)    History of present illness:  Patient reports feeling like his legs keep buckling. He has been coughing for the past few days. Denis muscle aches. He has been exposed to his sick grandchild. Denies any nausea vomitign or diarrhea. He reports increase in urination, blurred vision. States not been taking his insulin for long time. Maybe years. BG on arrival was 475, Cr up to 2.08. Reports not taking any of his medications and have not had follow up with ix PCP.    Hospital Course:   influenza PCR +-- tamiflu x 5 days -cultures NGTD -IV antibiotics d/c'd  Acute kidney failure ok CKD- resolved with IVF -baseline about 1.5  Lactic acidosis -IVF and recheck  DM hyperosmolarity type II, uncontrolled - - Lantus.  -d/c orals -patient not taking at home -family to monitor his blood sugars and bring to PCP  Procedures:    Consultations:    Discharge Exam: Filed Vitals:   08/02/14 0844  BP: 128/55  Pulse: 92  Temp: 98.1 F (36.7 C)  Resp: 16    General: A+Ox3, NAD- much improved Cardiovascular: rrr Respiratory: clear  Discharge Instructions   Discharge Instructions    Diet - low sodium heart healthy    Complete by:  As directed      Diet  Carb Modified    Complete by:  As directed      Discharge instructions    Complete by:  As directed   Drink lots of fluids Bmp (labs) 1 week with PCP Check BP and blood sugars at home, bring to PCP     Increase activity slowly    Complete by:  As directed           Current Discharge Medication List    START taking these medications   Details  oseltamivir (TAMIFLU) 30 MG capsule Take 1 capsule (30 mg total) by mouth 2 (two) times daily. Qty: 8 capsule, Refills: 0      CONTINUE these medications which have CHANGED   Details  LANTUS SOLOSTAR 100 UNIT/ML Solostar Pen Inject 10 Units into the skin 2 (two) times daily. Qty: 5 pen, Refills: 11      CONTINUE these medications which have NOT CHANGED   Details  aspirin 325 MG tablet Take 325 mg by mouth daily.      atenolol (TENORMIN) 50 MG tablet take 2 tablets by mouth every morning and 1 tablet by mouth every evening Qty: 300 tablet, Refills: 3      STOP taking these medications     amitriptyline (ELAVIL) 25 MG tablet      glipiZIDE (GLUCOTROL) 10 MG tablet      glucose blood (ACCU-CHEK AVIVA) test strip      hydrochlorothiazide (HYDRODIURIL) 25 MG tablet      losartan (COZAAR)  100 MG tablet      metFORMIN (GLUCOPHAGE) 1000 MG tablet      amLODipine (NORVASC) 10 MG tablet        No Known Allergies Follow-up Information    Follow up with TODD,JEFFREY ALLEN, MD In 1 week.   Specialty:  Family Medicine   Contact information:   [place] [address] [phone]        The results of significant diagnostics from this hospitalization (including imaging, microbiology, ancillary and laboratory) are listed below for reference.    Significant Diagnostic Studies: Dg Chest 2 View  07/31/2014   CLINICAL DATA:  Cough and weakness  EXAM: CHEST  2 VIEW  COMPARISON:  07/28/2012  FINDINGS: The heart size and mediastinal contours are within normal limits. Both lungs are clear. The visualized skeletal  structures are unremarkable.  IMPRESSION: No active cardiopulmonary disease.   Electronically Signed   By: Andreas Newport M.D.   On: 07/31/2014 21:35    Microbiology: Recent Results (from the past 240 hour(s))  Blood Culture (routine x 2)     Status: None (Preliminary result)   Collection Time: 07/31/14  8:38 PM  Result Value Ref Range Status   Specimen Description LEFT ANTECUBITAL  Final   Special Requests BOTTLES DRAWN AEROBIC AND ANAEROBIC 5CC  Final   Culture   Final           BLOOD CULTURE RECEIVED NO GROWTH TO DATE CULTURE WILL BE HELD FOR 5 DAYS BEFORE ISSUING A FINAL NEGATIVE REPORT Performed at [facility]    Report Status PENDING  Incomplete  Blood Culture (routine x 2)     Status: None (Preliminary result)   Collection Time: 07/31/14  8:53 PM  Result Value Ref Range Status   Specimen Description RIGHT ANTECUBITAL  Final   Special Requests BOTTLES DRAWN AEROBIC AND ANAEROBIC 5CC  Final   Culture   Final           BLOOD CULTURE RECEIVED NO GROWTH TO DATE CULTURE WILL BE HELD FOR 5 DAYS BEFORE ISSUING A FINAL NEGATIVE REPORT Performed at [facility]    Report Status PENDING  Incomplete  Urine culture     Status: None   Collection Time: 07/31/14  9:30 PM  Result Value Ref Range Status   Specimen Description URINE, CLEAN CATCH  Final   Special Requests NONE  Final   Colony Count NO GROWTH Performed at [facility]   Final   Culture NO GROWTH Performed at [facility]   Final   Report Status 08/02/2014 FINAL  Final     Labs: Basic Metabolic Panel:  Recent Labs Lab 07/31/14 2038 08/01/14 1700 08/02/14 0536  NA 134* 140 140  K 4.9 4.5 3.8  CL 96 102 105  CO2 23 29 26   GLUCOSE 475* 308* 61*  BUN 34* 39* 37*  CREATININE 2.08* 1.83* 1.68*  CALCIUM 10.2 8.8 8.5  MG  --  2.1  --   PHOS  --  3.9  --    Liver Function Tests:  Recent Labs Lab 07/31/14 2038 08/01/14 1700  AST 26 29  ALT 25 21  ALKPHOS 80 60  BILITOT 0.9  0.7  PROT 7.8 6.1  ALBUMIN 3.9 3.0*   No results for input(s): LIPASE, AMYLASE in the last 168 hours. No results for input(s): AMMONIA in the last 168 hours. CBC:  Recent Labs Lab 07/31/14 2038 08/01/14 1700 08/02/14 0536  WBC 8.6 5.5 5.0  NEUTROABS 6.7  --   --   HGB 15.2 12.9* 11.9*  HCT 44.1 38.1* 35.4*  MCV 85.6 86.8 86.1  PLT 214 197 168   Cardiac Enzymes: No results for input(s): CKTOTAL, CKMB, CKMBINDEX, TROPONINI in the last 168 hours. BNP: BNP (last 3 results) No results for input(s): BNP in the last 8760 hours.  ProBNP (last 3 results) No results for input(s): PROBNP in the last 8760 hours.  CBG:  Recent Labs Lab 08/01/14 1810 08/01/14 2026 08/01/14 2150 08/02/14 0748 08/02/14 0842  GLUCAP 264* 241* 213* 48* 114*       Signed:  Caydence Enck  [facility] 08/02/2014, 10:09 AM

## 2014-08-03 LAB — HEMOGLOBIN A1C
Hgb A1c MFr Bld: >20 % — ABNORMAL HIGH (ref 4.8–5.6)
Mean Plasma Glucose: >527 mg/dL

## 2014-08-07 LAB — CULTURE, BLOOD (ROUTINE X 2)
CULTURE: NO GROWTH
Culture: NO GROWTH

## 2014-08-09 ENCOUNTER — Encounter: Payer: Self-pay | Admitting: Internal Medicine

## 2014-08-09 ENCOUNTER — Ambulatory Visit (INDEPENDENT_AMBULATORY_CARE_PROVIDER_SITE_OTHER): Payer: BC Managed Care – PPO | Admitting: Internal Medicine

## 2014-08-09 ENCOUNTER — Other Ambulatory Visit: Payer: Self-pay | Admitting: *Deleted

## 2014-08-09 VITALS — BP 140/80 | HR 75 | Temp 98.1°F | Resp 20 | Ht 70.0 in | Wt 163.0 lb

## 2014-08-09 DIAGNOSIS — A419 Sepsis, unspecified organism: Secondary | ICD-10-CM

## 2014-08-09 DIAGNOSIS — E1100 Type 2 diabetes mellitus with hyperosmolarity without nonketotic hyperglycemic-hyperosmolar coma (NKHHC): Secondary | ICD-10-CM | POA: Diagnosis not present

## 2014-08-09 DIAGNOSIS — R6510 Systemic inflammatory response syndrome (SIRS) of non-infectious origin without acute organ dysfunction: Secondary | ICD-10-CM

## 2014-08-09 DIAGNOSIS — E1165 Type 2 diabetes mellitus with hyperglycemia: Secondary | ICD-10-CM

## 2014-08-09 DIAGNOSIS — N179 Acute kidney failure, unspecified: Secondary | ICD-10-CM

## 2014-08-09 DIAGNOSIS — I951 Orthostatic hypotension: Secondary | ICD-10-CM | POA: Diagnosis not present

## 2014-08-09 MED ORDER — ATENOLOL 50 MG PO TABS
ORAL_TABLET | ORAL | Status: DC
Start: 2014-08-09 — End: 2014-08-09

## 2014-08-09 MED ORDER — GLUCOSE BLOOD VI STRP: ORAL_STRIP | Status: DC

## 2014-08-09 MED ORDER — INSULIN PEN NEEDLE 32G X 4 MM MISC: Status: DC

## 2014-08-09 MED ORDER — ONETOUCH LANCETS MISC: Status: DC

## 2014-08-09 MED ORDER — LANTUS SOLOSTAR 100 UNIT/ML ~~LOC~~ SOPN: 16.0000 [IU] | PEN_INJECTOR | Freq: Every day | SUBCUTANEOUS | Status: DC

## 2014-08-09 MED ORDER — ASPIRIN 81 MG PO TABS: 81.0000 mg | ORAL_TABLET | Freq: Every day | ORAL | Status: DC

## 2014-08-09 NOTE — Patient Instructions (Addendum)
Decrease aspirin therapy to 81 mg daily Lantus insulin 16 units at bedtime once daily  Return in 2 weeks for follow-up  Hypoglycemia Hypoglycemia occurs when the glucose in your blood is too low. Glucose is a type of sugar that is your body's main energy source. Hormones, such as insulin and glucagon, control the level of glucose in the blood. Insulin lowers blood glucose and glucagon increases blood glucose. Having too much insulin in your blood stream, or not eating enough food containing sugar, can result in hypoglycemia. Hypoglycemia can happen to people with or without diabetes. It can develop quickly and can be a medical emergency.  CAUSES   Missing or delaying meals.  Not eating enough carbohydrates at meals.  Taking too much diabetes medicine.  Not timing your oral diabetes medicine or insulin doses with meals, snacks, and exercise.  Nausea and vomiting.  Certain medicines.  Severe illnesses, such as hepatitis, kidney disorders, and certain eating disorders.  Increased activity or exercise without eating something extra or adjusting medicines.  Drinking too much alcohol.  A nerve disorder that affects body functions like your heart rate, blood pressure, and digestion (autonomic neuropathy).  A condition where the stomach muscles do not function properly (gastroparesis). Therefore, medicines and food may not absorb properly.  Rarely, a tumor of the pancreas can produce too much insulin. SYMPTOMS   Hunger.  Sweating (diaphoresis).  Change in body temperature.  Shakiness.  Headache.  Anxiety.  Lightheadedness.  Irritability.  Difficulty concentrating.  Dry mouth.  Tingling or numbness in the hands or feet.  Restless sleep or sleep disturbances.  Altered speech and coordination.  Change in mental status.  Seizures or prolonged convulsions.  Combativeness.  Drowsiness (lethargic).  Weakness.  Increased heart rate or  palpitations.  Confusion.  Pale, gray skin color.  Blurred or double vision.  Fainting. DIAGNOSIS  A physical exam and medical history will be performed. Your caregiver may make a diagnosis based on your symptoms. Blood tests and other lab tests may be performed to confirm a diagnosis. Once the diagnosis is made, your caregiver will see if your signs and symptoms go away once your blood glucose is raised.  TREATMENT  Usually, you can easily treat your hypoglycemia when you notice symptoms.  Check your blood glucose. If it is less than 70 mg/dl, take one of the following:   3-4 glucose tablets.    cup juice.    cup regular soda.   1 cup skim milk.   -1 tube of glucose gel.   5-6 hard candies.   Avoid high-fat drinks or food that may delay a rise in blood glucose levels.  Do not take more than the recommended amount of sugary foods, drinks, gel, or tablets. Doing so will cause your blood glucose to go too high.   Wait 10-15 minutes and recheck your blood glucose. If it is still less than 70 mg/dl or below your target range, repeat treatment.   Eat a snack if it is more than 1 hour until your next meal.  There may be a time when your blood glucose may go so low that you are unable to treat yourself at home when you start to notice symptoms. You may need someone to help you. You may even faint or be unable to swallow. If you cannot treat yourself, someone will need to bring you to the hospital.  [place]  If you have diabetes, follow your diabetes management plan by:  Taking your medicines  as directed.  Following your exercise plan.  Following your meal plan. Do not skip meals. Eat on time.  Testing your blood glucose regularly. Check your blood glucose before and after exercise. If you exercise longer or different than usual, be sure to check blood glucose more frequently.  Wearing your medical alert jewelry that says you have  diabetes.  Identify the cause of your hypoglycemia. Then, develop ways to prevent the recurrence of hypoglycemia.  Do not take a hot bath or shower right after an insulin shot.  Always carry treatment with you. Glucose tablets are the easiest to carry.  If you are going to drink alcohol, drink it only with meals.  Tell friends or family members ways to keep you safe during a seizure. This may include removing hard or sharp objects from the area or turning you on your side.  Maintain a healthy weight. SEEK MEDICAL CARE IF:   You are having problems keeping your blood glucose in your target range.  You are having frequent episodes of hypoglycemia.  You feel you might be having side effects from your medicines.  You are not sure why your blood glucose is dropping so low.  You notice a change in vision or a new problem with your vision. SEEK IMMEDIATE MEDICAL CARE IF:   Confusion develops.  A change in mental status occurs.  The inability to swallow develops.  Fainting occurs. Document Released: 05/04/2005 Document Revised: 05/09/2013 Document Reviewed: 08/31/2011 [facility] Patient Information 2015 [place], [place]. This information is not intended to replace advice given to you by your health care provider. Make sure you discuss any questions you have with your health care provider.   Check blood sugars daily, record, and bring diary of blood sugar readings to your next visit

## 2014-08-09 NOTE — Progress Notes (Signed)
Subjective:    Patient ID: [name] [name], male    DOB: 1951-05-12, 64 y.o.   MRN: [id number]  HPI  Admit date: 07/31/2014 Discharge date: 08/02/2014  Recommendations for Outpatient Follow-up:  1. Patient has not been taking meds as prescribed, modified meds to what patient had in hospital and wife will check blood sugars and blood pressures at home and bring to PCP  Discharge Diagnoses:  Active Problems:  Acute kidney failure  Sepsis  SIRS (systemic inflammatory response syndrome)  Hyperglycemia  DM hyperosmolarity type II, uncontrolled   Discharge Condition: improved  Diet recommendation: cardiac/diabetic  64 year old patient who was discharged from the hospital 12 days ago.  He presented with influenza and uncontrolled diabetes with volume depletion. He was discharged on Lantus insulin 10 units twice daily and aspirin 325 mg daily.  He was also discharged on atenolol, but he has not gotten this prescription filled.  He did complete 5 days of Tamiflu He generally feels well. No home blood sugar monitoring.  No hypoglycemic symptoms  Hospital records reviewed  Past Medical History  Diagnosis Date  . Hypertension   . ED (erectile dysfunction)   . Peripheral neuropathy   . Diabetes mellitus     type I and type II  . Chronic kidney disease     "starting to bother me now" (07/28/2012)    History   Social History  . Marital Status: Married    Spouse Name: N/A  . Number of Children: N/A  . Years of Education: N/A   Occupational History  .      [employer]   Social History Main Topics  . Smoking status: Former Smoker -- 0.12 packs/day for 10 years    Types: Cigarettes  . Smokeless tobacco: Never Used     Comment: 07/28/2012 "quit smoking cigarettes 20-30 yr ago"  . Alcohol Use: 2.4 oz/week    4 Cans of beer per week     Comment: daily to less than one a month  . Drug Use: No  . Sexual Activity: Not Currently   Other Topics Concern  . Not on  file   Social History Narrative    Past Surgical History  Procedure Laterality Date  . Appendectomy  1950's    Family History  Problem Relation Age of Onset  . Hypertension    . Diabetes    . Colon polyps Mother     No Known Allergies  Current Outpatient Prescriptions on File Prior to Visit  Medication Sig Dispense Refill  . aspirin 325 MG tablet Take 325 mg by mouth daily.      Marland Kitchen LANTUS SOLOSTAR 100 UNIT/ML Solostar Pen Inject 10 Units into the skin 2 (two) times daily. 5 pen 11   No current facility-administered medications on file prior to visit.    BP 140/80 mmHg  Pulse 75  Temp(Src) 98.1 F (36.7 C) (Oral)  Resp 20  Ht 5\' 10"  (1.778 m)  Wt 163 lb (73.936 kg)  BMI 23.39 kg/m2  SpO2 99%     Review of Systems  Constitutional: Negative for fever, chills, appetite change and fatigue.  HENT: Negative for congestion, dental problem, ear pain, hearing loss, sore throat, tinnitus, trouble swallowing and voice change.   Eyes: Negative for pain, discharge and visual disturbance.  Respiratory: Negative for cough, chest tightness, wheezing and stridor.   Cardiovascular: Negative for chest pain, palpitations and leg swelling.  Gastrointestinal: Negative for nausea, vomiting, abdominal pain, diarrhea, constipation, blood in stool  and abdominal distention.  Genitourinary: Negative for urgency, hematuria, flank pain, discharge, difficulty urinating and genital sores.  Musculoskeletal: Negative for myalgias, back pain, joint swelling, arthralgias, gait problem and neck stiffness.  Skin: Negative for rash.  Neurological: Negative for dizziness, syncope, speech difficulty, weakness, numbness and headaches.  Hematological: Negative for adenopathy. Does not bruise/bleed easily.  Psychiatric/Behavioral: Negative for behavioral problems and dysphoric mood. The patient is not nervous/anxious.        Objective:   Physical Exam  Constitutional: He is oriented to person, place, and  time. He appears well-developed. No distress.  Blood pressure 130/72 Pulse rate 70  HENT:  Head: Normocephalic.  Right Ear: External ear normal.  Left Ear: External ear normal.  Eyes: Conjunctivae and EOM are normal.  Neck: Normal range of motion.  Cardiovascular: Normal rate and normal heart sounds.   Pulmonary/Chest: Breath sounds normal.  Abdominal: Bowel sounds are normal.  Musculoskeletal: Normal range of motion. He exhibits no edema or tenderness.  Neurological: He is alert and oriented to person, place, and time.  Absent vibratory sensation of the feet  Psychiatric: He has a normal mood and affect. His behavior is normal.          Assessment & Plan:   Status post recent hospital admission for influenza A complicated by SIRS Uncontrolled diabetes with find depletion and acute renal failure Noncompliance  Information concerning hypoglycemic symptoms, dispensed Lantus insulin.  He changed to 16 units at bedtime Continue daily aspirin a reduced dose of 81 mg daily Hold blood pressure medications at this time  Recheck 2 weeks with follow-up lab.  Patient has been asked to chart  Home blood sugar readings and bring diary in next visit Compliance issues stressed  Home glucometer dispensed

## 2014-08-09 NOTE — Progress Notes (Signed)
Pre visit review using our clinic review tool, if applicable. No additional management support is needed unless otherwise documented below in the visit note. 

## 2014-08-17 ENCOUNTER — Telehealth: Payer: Self-pay | Admitting: Family Medicine

## 2014-08-17 NOTE — Telephone Encounter (Signed)
Pt was seen on 3-24 by dr Burnice Logan and now having  Swelling from waist down. Please advise

## 2014-08-17 NOTE — Telephone Encounter (Signed)
Ask  patient to adhere to a restricted salt diet Keep legs elevated as much as possible  Office follow-up next week  ED evaluation of patient develops any shortness of breath or worsening or new symptoms

## 2014-08-17 NOTE — Telephone Encounter (Signed)
Left message on voicemail to call office.  

## 2014-08-17 NOTE — Telephone Encounter (Signed)
Spoke to pt, told him Dr.K said to adhere to a restricted salt diet, keep legs elevated as much as possible and if become SOB or worsening symptoms need to go to the ED. Pt verbalized understanding. Also told pt Dr.K wants to see you next week for follow up. Pt verbalized understanding and transferred to scheduling.

## 2014-08-17 NOTE — Telephone Encounter (Signed)
Please see message and advise 

## 2014-08-20 ENCOUNTER — Encounter: Payer: Self-pay | Admitting: Internal Medicine

## 2014-08-20 ENCOUNTER — Ambulatory Visit (INDEPENDENT_AMBULATORY_CARE_PROVIDER_SITE_OTHER): Payer: BC Managed Care – PPO | Admitting: Internal Medicine

## 2014-08-20 DIAGNOSIS — E1100 Type 2 diabetes mellitus with hyperosmolarity without nonketotic hyperglycemic-hyperosmolar coma (NKHHC): Secondary | ICD-10-CM

## 2014-08-20 DIAGNOSIS — N179 Acute kidney failure, unspecified: Secondary | ICD-10-CM

## 2014-08-20 DIAGNOSIS — G609 Hereditary and idiopathic neuropathy, unspecified: Secondary | ICD-10-CM

## 2014-08-20 DIAGNOSIS — E1165 Type 2 diabetes mellitus with hyperglycemia: Secondary | ICD-10-CM

## 2014-08-20 LAB — GLUCOSE, POCT (MANUAL RESULT ENTRY): POC Glucose: 99 mg/dL (ref 70–99)

## 2014-08-20 NOTE — Patient Instructions (Signed)
Limit your sodium (Salt) intake  Keep your legs elevated as much as possible  Return in 2 weeks for follow-up  Sodium and Fluid Restriction Some health conditions may require you to restrict your sodium and fluid intake. Sodium is part of the salt in the blood. Sodium may be restricted because when you take in a lot of salt, you become thirsty. Limiting salt with help you become less thirsty and may make it easier to restrict fluid. Talk to your caregiver or dietician about how many cups of fluid and how many milligrams of sodium you are allowed each day. If your caregiver has restricted your sodium and fluids, usually the amount you can drink depends on several things, such as:  Your urine output.  How much fluid you are retaining.  Your blood pressure. Every 2 cups (500 mL) of fluid retained in the body becomes an extra 1 pound (0.5 kg) of body weight. The following are examples of some fluids you will have to restrict:  Tea, coffee, soda, lemonade, milk, water, and juice.  Alcoholic beverages.  Cream.  Gravy.  Ice cubes.  Soup and broth. The following are foods that become liquid at room temperature. These foods will count towards your fluid intake.  Ice cream and ice milk.  Frozen yogurt and sherbet.  Frozen ice pops.  Flavored gelatin. YOU MAY BE TAKING IN TOO MUCH FLUID IF:  Your weight increases.  Your face, hands, legs, feet, and abdomen start to swell.  You have trouble breathing. HOME CARE INSTRUCTIONS If you follow a low sodium diet closely, you will eat approximately 1,500 mg of sodium a day.   Avoid salty foods. This increases your thirst and makes fluid control more difficult. Foods high in sodium include:  Most canned foods, including most meats.  Most processed foods, including most meats.  Cheese.  Dried pasta and rice mixes.  Snack foods (chips, popcorn, pretzels, cheese puffs, salted nuts).  Dips, sauces, and salad dressings.  Do not use  salt in cooking or add salt to your meal. Lacinda Axon with herbs and spices, but not those that have salt in the name. Ask your caregiver if it is okay to use salt substitutes.  Eat home-prepared meals. Use fresh ingredients. Avoid canned, frozen, or packaged meals.  Read food labels to see how much sodium is in the food. Know how much sodium you are allowed each day.  When eating out, ask for dressings and sauces on the side.  Weigh yourself every morning with an empty bladder before you eat or drink. If your weight is going up, you are retaining fluid.  Freeze fruit juice or water in an ice cube tray. Use this as part of your fluid allowance.  Brush your teeth often or rinse your mouth with mouthwash to help your dry mouth. Lemon wedges, hard sour candies, chewing gum, or breath spray may help to moisten your mouth too.  Add a slice of fresh lemon or lemon juice to water or ice. This helps satisfy your thirst.  Try frozen fruits between meals, such as grapes or strawberries.  Swallow your pills along with meals or soft foods. This helps you save your fluid for something you enjoy.  Use small cups and glasses and learn to sip fluids slowly.  Keep your home cooler. Keep the air in your home as humid as possible. Dry air increases thirst.  Avoid being out in the hot sun. Each morning, fill a jug with the amount of water  you are allowed for the day. You can use this water as a guideline for fluid allowance. Each time you take in fluid, pour an equal amount of water out of the container. This helps you to see how much fluid you are taking in. It also helps plan your fluid intake for the rest of the day. CONVERSIONS TO HELP MEASURE FLUID INTAKE  1 cup equals 8 oz (240 mL).   cup equals 6 oz (180 mL).   cup equals 5  oz (160 mL).   cup equals 4 oz (120 mL).   cup equals 2  oz (80 mL).   cup equals 2 oz (60 mL).  2 tbs equals 1 oz (30 mL). Document Released: 03/01/2007 Document  Revised: 11/03/2011 Document Reviewed: 10/16/2011 [facility] Patient Information 2015 [place], [place]. This information is not intended to replace advice given to you by your health care provider. Make sure you discuss any questions you have with your health care provider.

## 2014-08-20 NOTE — Progress Notes (Signed)
Subjective:    Patient ID: [name] [name], male    DOB: [birth year], 64 y.o.   MRN: [id number]  HPI  64 year old patient who was discharged from the hospital recently with syncope and significant acute renal failure secondary to volume depletion in the setting of SIRS/flu syndrome. Presently he is on Lantus insulin 16 units daily.  Blood sugars range from 71 to approximately 200. He states average fasting blood sugars approximately 170.  Random blood sugar here today 99.  No hypoglycemia  He generally feels well but has had some weight gain and lower extremity edema since his last visit here  Wt Readings from Last 3 Encounters:  08/20/14 173 lb (78.472 kg)  08/09/14 163 lb (73.936 kg)  07/31/14 150 lb (68.04 kg)    Lab Results  Component Value Date   HGBA1C >20.0* 08/01/2014   He generally feels well.  Diabetic complications include peripheral neuropathy.  Past Medical History  Diagnosis Date  . Hypertension   . ED (erectile dysfunction)   . Peripheral neuropathy   . Diabetes mellitus     type I and type II  . Chronic kidney disease     "starting to bother me now" (07/28/2012)    History   Social History  . Marital Status: Married    Spouse Name: N/A  . Number of Children: N/A  . Years of Education: N/A   Occupational History  .      [employer]   Social History Main Topics  . Smoking status: Former Smoker -- 0.12 packs/day for 10 years    Types: Cigarettes  . Smokeless tobacco: Never Used     Comment: 07/28/2012 "quit smoking cigarettes 20-30 yr ago"  . Alcohol Use: 2.4 oz/week    4 Cans of beer per week     Comment: daily to less than one a month  . Drug Use: No  . Sexual Activity: Not Currently   Other Topics Concern  . Not on file   Social History Narrative    Past Surgical History  Procedure Laterality Date  . Appendectomy  1950's    Family History  Problem Relation Age of Onset  . Hypertension    . Diabetes    . Colon polyps  Mother     No Known Allergies  Current Outpatient Prescriptions on File Prior to Visit  Medication Sig Dispense Refill  . aspirin 81 MG tablet Take 1 tablet (81 mg total) by mouth daily. 30 tablet   . glucose blood (ONETOUCH VERIO) test strip USE TO CHECK BLOOD SUGAR DAILY AND PRN 100 each 6  . LANTUS SOLOSTAR 100 UNIT/ML Solostar Pen Inject 16 Units into the skin daily at 10 pm. 16 units at bedtime 5 pen 11  . ONE TOUCH LANCETS MISC USE TO CHECK BLOOD SUGAR DAILY AND PRN 200 each 3   No current facility-administered medications on file prior to visit.    BP 140/90 mmHg  Pulse 73  Temp(Src) 98.2 F (36.8 C) (Oral)  Resp 20  Ht 5\' 10"  (1.778 m)  Wt 173 lb (78.472 kg)  BMI 24.82 kg/m2  SpO2 99%     Review of Systems  Constitutional: Positive for unexpected weight change. Negative for fever, chills, appetite change and fatigue.  HENT: Negative for congestion, dental problem, ear pain, hearing loss, sore throat, tinnitus, trouble swallowing and voice change.   Eyes: Negative for pain, discharge and visual disturbance.  Respiratory: Negative for cough, chest tightness, wheezing and stridor.  Cardiovascular: Positive for leg swelling. Negative for chest pain and palpitations.  Gastrointestinal: Negative for nausea, vomiting, abdominal pain, diarrhea, constipation, blood in stool and abdominal distention.  Genitourinary: Negative for urgency, hematuria, flank pain, discharge, difficulty urinating and genital sores.  Musculoskeletal: Negative for myalgias, back pain, joint swelling, arthralgias, gait problem and neck stiffness.  Skin: Negative for rash.  Neurological: Negative for dizziness, syncope, speech difficulty, weakness, numbness and headaches.  Hematological: Negative for adenopathy. Does not bruise/bleed easily.  Psychiatric/Behavioral: Negative for behavioral problems and dysphoric mood. The patient is not nervous/anxious.        Objective:   Physical Exam    Constitutional: He is oriented to person, place, and time. He appears well-developed.  Blood pressure 140/82  HENT:  Head: Normocephalic.  Right Ear: External ear normal.  Left Ear: External ear normal.  Eyes: Conjunctivae and EOM are normal.  Neck: Normal range of motion.  Cardiovascular: Normal rate and normal heart sounds.   Pulmonary/Chest: Breath sounds normal.  Abdominal: Bowel sounds are normal.  Musculoskeletal: Normal range of motion. He exhibits edema. He exhibits no tenderness.  Plus 1 lower extremity and pedal edema  Neurological: He is alert and oriented to person, place, and time.  Psychiatric: He has a normal mood and affect. His behavior is normal.          Assessment & Plan:   Diabetes mellitus.  Will continue basal insulin only at the present time.  Likely will need mealtime insulin in the future for better glycemic control Lower extremity edema.  In view of recent history of volume depletion and acute renal failure.  We'll hold off on diuretic therapy at this time.  We'll check renal indices and stress a low-salt diet and elevation History of acute renal failure.  Recheck basic metabolic profile  Recheck 2 weeks

## 2014-08-20 NOTE — Progress Notes (Signed)
Pre visit review using our clinic review tool, if applicable. No additional management support is needed unless otherwise documented below in the visit note. 

## 2014-08-20 NOTE — Progress Notes (Deleted)
   Subjective:    Patient ID: [name] [name], male    DOB: [birth year], 64 y.o.   MRN: [id number]  HPI Wt Readings from Last 3 Encounters:  08/20/14 173 lb (78.472 kg)  08/09/14 163 lb (73.936 kg)  07/31/14 150 lb (68.04 kg)   Lab Results  Component Value Date   HGBA1C >20.0* 08/01/2014     Review of Systems     Objective:   Physical Exam        Assessment & Plan:

## 2014-08-28 ENCOUNTER — Encounter: Payer: Self-pay | Admitting: Family Medicine

## 2014-08-28 ENCOUNTER — Ambulatory Visit (INDEPENDENT_AMBULATORY_CARE_PROVIDER_SITE_OTHER): Payer: BC Managed Care – PPO | Admitting: Family Medicine

## 2014-08-28 VITALS — BP 180/90 | Temp 97.9°F | Ht 70.0 in | Wt 167.7 lb

## 2014-08-28 DIAGNOSIS — I10 Essential (primary) hypertension: Secondary | ICD-10-CM

## 2014-08-28 DIAGNOSIS — E1165 Type 2 diabetes mellitus with hyperglycemia: Principal | ICD-10-CM

## 2014-08-28 DIAGNOSIS — E1100 Type 2 diabetes mellitus with hyperosmolarity without nonketotic hyperglycemic-hyperosmolar coma (NKHHC): Secondary | ICD-10-CM | POA: Diagnosis not present

## 2014-08-28 LAB — POCT URINALYSIS DIPSTICK
BILIRUBIN UA: NEGATIVE
Ketones, UA: NEGATIVE
LEUKOCYTES UA: NEGATIVE
Nitrite, UA: NEGATIVE
PH UA: 6.5
Spec Grav, UA: 1.025
Urobilinogen, UA: 0.2

## 2014-08-28 LAB — CBC WITH DIFFERENTIAL/PLATELET
BASOS ABS: 0 10*3/uL (ref 0.0–0.1)
Basophils Relative: 0.6 % (ref 0.0–3.0)
EOS PCT: 2.3 % (ref 0.0–5.0)
Eosinophils Absolute: 0.1 10*3/uL (ref 0.0–0.7)
HEMATOCRIT: 37.3 % — AB (ref 39.0–52.0)
Hemoglobin: 12.4 g/dL — ABNORMAL LOW (ref 13.0–17.0)
LYMPHS PCT: 18.5 % (ref 12.0–46.0)
Lymphs Abs: 0.9 10*3/uL (ref 0.7–4.0)
MCHC: 33.2 g/dL (ref 30.0–36.0)
MCV: 86.8 fl (ref 78.0–100.0)
Monocytes Absolute: 0.4 10*3/uL (ref 0.1–1.0)
Monocytes Relative: 8.9 % (ref 3.0–12.0)
Neutro Abs: 3.3 10*3/uL (ref 1.4–7.7)
Neutrophils Relative %: 69.7 % (ref 43.0–77.0)
PLATELETS: 280 10*3/uL (ref 150.0–400.0)
RBC: 4.3 Mil/uL (ref 4.22–5.81)
RDW: 13.1 % (ref 11.5–15.5)
WBC: 4.7 10*3/uL (ref 4.0–10.5)

## 2014-08-28 LAB — BASIC METABOLIC PANEL
BUN: 25 mg/dL — AB (ref 6–23)
CHLORIDE: 103 meq/L (ref 96–112)
CO2: 30 meq/L (ref 19–32)
Calcium: 9.2 mg/dL (ref 8.4–10.5)
Creatinine, Ser: 1.48 mg/dL (ref 0.40–1.50)
GFR: 61.22 mL/min (ref >60.00)
Glucose, Bld: 276 mg/dL — ABNORMAL HIGH (ref 70–99)
POTASSIUM: 4.6 meq/L (ref 3.5–5.1)
Sodium: 136 mEq/L (ref 135–145)

## 2014-08-28 LAB — LIPID PANEL
CHOLESTEROL: 229 mg/dL — AB (ref 0–200)
HDL: 83.6 mg/dL (ref >39.00)
LDL Cholesterol: 124 mg/dL — ABNORMAL HIGH (ref 0–99)
NONHDL: 145.4
Total CHOL/HDL Ratio: 3
Triglycerides: 108 mg/dL (ref 0.0–149.0)
VLDL: 21.6 mg/dL (ref 0.0–40.0)

## 2014-08-28 LAB — HEPATIC FUNCTION PANEL
ALT: 16 U/L (ref 0–53)
AST: 15 U/L (ref 0–37)
Albumin: 3.4 g/dL — ABNORMAL LOW (ref 3.5–5.2)
Alkaline Phosphatase: 70 U/L (ref 39–117)
BILIRUBIN DIRECT: 0.1 mg/dL (ref 0.0–0.3)
TOTAL PROTEIN: 6.7 g/dL (ref 6.0–8.3)
Total Bilirubin: 0.4 mg/dL (ref 0.2–1.2)

## 2014-08-28 LAB — HEMOGLOBIN A1C: Hgb A1c MFr Bld: 14.5 % — ABNORMAL HIGH (ref 4.6–6.5)

## 2014-08-28 MED ORDER — HYDROCHLOROTHIAZIDE 12.5 MG PO TABS: 12.5000 mg | ORAL_TABLET | Freq: Every day | ORAL | Status: DC

## 2014-08-28 MED ORDER — METFORMIN HCL 1000 MG PO TABS: ORAL_TABLET | ORAL | Status: DC

## 2014-08-28 MED ORDER — GLUCOSE BLOOD VI STRP: ORAL_STRIP | Status: DC

## 2014-08-28 MED ORDER — LOSARTAN POTASSIUM 100 MG PO TABS: 100.0000 mg | ORAL_TABLET | Freq: Every day | ORAL | Status: DC

## 2014-08-28 NOTE — Patient Instructions (Addendum)
Metformin 1000 mg......... one half tablet before breakfast...Marland KitchenMarland KitchenMarland Kitchen one half tablet before your evening  Insulin........ 20 units at bedtime  Fasting blood sugar daily in the morning  Restart the Cozaar 100 mg.......... one tablet daily in the morning,,,,,,,, BP check daily in the morning  Follow-up in one week,,,,,,,,,, bring a record of all your blood sugar and blood pressure readings to each visit  Hydrocort thiazide 12.5 mg.............Marland Kitchen 1 daily in the morning

## 2014-08-28 NOTE — Progress Notes (Signed)
   Subjective:    Patient ID: [name] [name], male    DOB: [birth year], 64 y.o.   MRN: [id number]  HPI Nuri is a 64 year old male who comes in today for follow-up of sepsis  He had a viral infection and developed sepsis and was admitted the hospital on March 15 discharge March 17. At that time he was acute renal failure hypoglycemia etc. etc. Since his discharge she's been feeling well and taking 16 units of Lantus daily. During the hospital stay they stop the Elavil Glucotrol Hydrocort thiazide Cozaar metformin and Norvasc. BP now elevated 180/90  What sugar this morning over 300   Review of Systems Review of systems otherwise negative    Objective:   Physical Exam Well-developed well-nourished male no acute distress vital signs stable he is afebrile except for elevated blood pressure       Assessment & Plan:  Diabetes type 1 not at goal......... increase Lantus to 20 units at bedtime.......Marland Kitchen restart metformin 500 mg twice a day  Hypertension...Marland KitchenMarland KitchenMarland Kitchen restart Cozaar..... BP check every morning follow-up in one week  Check labs

## 2014-09-04 ENCOUNTER — Other Ambulatory Visit: Payer: Self-pay | Admitting: Family Medicine

## 2014-09-04 ENCOUNTER — Ambulatory Visit (INDEPENDENT_AMBULATORY_CARE_PROVIDER_SITE_OTHER): Payer: BC Managed Care – PPO | Admitting: Family Medicine

## 2014-09-04 ENCOUNTER — Encounter: Payer: Self-pay | Admitting: Family Medicine

## 2014-09-04 VITALS — BP 170/80 | Temp 97.9°F | Wt 165.0 lb

## 2014-09-04 DIAGNOSIS — E139 Other specified diabetes mellitus without complications: Secondary | ICD-10-CM | POA: Diagnosis not present

## 2014-09-04 DIAGNOSIS — I10 Essential (primary) hypertension: Secondary | ICD-10-CM

## 2014-09-04 MED ORDER — METFORMIN HCL 1000 MG PO TABS: ORAL_TABLET | ORAL | Status: DC

## 2014-09-04 MED ORDER — AMLODIPINE BESYLATE 5 MG PO TABS: 5.0000 mg | ORAL_TABLET | Freq: Every day | ORAL | Status: DC

## 2014-09-04 MED ORDER — GLUCOSE BLOOD VI STRP
ORAL_STRIP | Status: DC
Start: 2014-09-04 — End: 2014-12-11

## 2014-09-04 NOTE — Progress Notes (Signed)
Pre visit review using our clinic review tool, if applicable. No additional management support is needed unless otherwise documented below in the visit note. 

## 2014-09-04 NOTE — Patient Instructions (Signed)
Metformin 1000 mg.......... one tablet before breakfast........... a half a tablet before your evening meal  Complete Sugar free diet  Walk 30 minutes daily  No salt  Check your blood sugar daily in the morning.......Marland Kitchen return in one week for follow-up  Check your blood pressure daily in the morning with a new meter..... Om[employer] pump up digital blood pressure cuff.......Marland Kitchen [employer]  Continue the Hydrocort thiazide and Cozaar.....Marland Kitchen one of each daily in the morning.......... add Norvasc 5 mg.........Marland Kitchen 1 now.......... then one every morning with the hydrochlorothiazide and the Cozaar  Return in one week bring a record of all your blood sugar blood pressure readings in your meters with you

## 2014-09-04 NOTE — Progress Notes (Signed)
   Subjective:    Patient ID: [name] [name], male    DOB: [birth year], 64 y.o.   MRN: [id number]  HPI [name] [name] is a 64 year old married male nonsmoker who comes in today for follow-up of diabetes and hypertension  His blood pressure is 170/80 when he came in repeat by me 180/80. He's on Cozaar 100 mg daily along with Hydrocort thiazide 12.5 mg daily. He has an old old cough. He's blood pressures at home he said are fairly normal however they are not. His cuff is malfunctioning. Advised to get a new cough  His blood sugar on metformin 500 mg twice a day is in the 140-150 range at home. His blood sugars actually 276 with an A1c of 14.5. He has an old meter. Advised to get a new meter. We gave him a new meter last time however he did not get the test strips. Advised to stop the old meter get the test strips   Review of Systems    review of systems otherwise negative Objective:   Physical Exam  Well-developed well-nourished male no acute distress vital signs stable he is afebrile except for BP 190/80 right arm sitting position      Assessment & Plan:  Diabetes type 2............ increase metformin 1000 mg in the morning and 500 mg prior to evening meal  Hypertension not at goal,,,,,,, continue Cozaar and hydrochlorothiazide add Norvasc 5 mg daily. BP check daily in the morning follow-up in one week

## 2014-09-06 ENCOUNTER — Telehealth: Payer: Self-pay | Admitting: Family Medicine

## 2014-09-10 ENCOUNTER — Encounter: Payer: Self-pay | Admitting: Family Medicine

## 2014-09-10 ENCOUNTER — Ambulatory Visit (INDEPENDENT_AMBULATORY_CARE_PROVIDER_SITE_OTHER): Payer: BC Managed Care – PPO | Admitting: Family Medicine

## 2014-09-10 VITALS — HR 80 | Temp 98.4°F | Wt 165.0 lb

## 2014-09-10 DIAGNOSIS — I10 Essential (primary) hypertension: Secondary | ICD-10-CM

## 2014-09-10 MED ORDER — AMLODIPINE BESYLATE 10 MG PO TABS: 10.0000 mg | ORAL_TABLET | Freq: Every day | ORAL | Status: DC

## 2014-09-10 NOTE — Progress Notes (Signed)
   Subjective:    Patient ID: KEYMANI BETTEN, male    DOB: Feb 21, 1951, 64 y.o.   MRN: [id number]  HPI Nicasio is a 64 year old male who comes in today for reevaluation of hypertension. We've increased his Norvasc to 5 mg daily along with HCTZ 12.5 mg daily and Cozaar 100 mg daily. BP is dropped in the 140/90 range.  BP right arm sitting position by me 170/90 repeat with his new Omron digital blood pressure cuff same   Review of Systems Review of systems otherwise negative    Objective:   Physical Exam Well-developed well-nourished male no acute distress vital signs stable he is afebrile BP right arm sitting position 170/90       Assessment & Plan:  Hypertension not at goal........ increase Norvasc to 10 mg daily BP check daily follow-up in one month

## 2014-09-10 NOTE — Patient Instructions (Signed)
Increase the Norvasc to 10 mg daily  Continue hydrochlorothiazide .......Marland Kitchen 12.5 mg daily  Continue Cozaar 100 mg daily  BP check every morning follow-up in one month

## 2014-09-10 NOTE — Progress Notes (Signed)
Pre visit review using our clinic review tool, if applicable. No additional management support is needed unless otherwise documented below in the visit note. 

## 2014-09-25 ENCOUNTER — Telehealth: Payer: Self-pay | Admitting: Family Medicine

## 2014-09-25 NOTE — Telephone Encounter (Signed)
Pt states he is having some symptoms he is sure it is coming for the amLODipine (NORVASC) 10 MG tablet. Pt is having edema in feet, ankles, and calves. Sometimes feel like they will burst. Feet feel hot like in front of a stove. Pt has fup on 5/26. Pls advise

## 2014-09-25 NOTE — Telephone Encounter (Signed)
Per Dr Sherren Mocha, patient should take HCTZ 12.5 mg every day except 2 pills Monday, Wednesday, Friday.  Patient is aware.

## 2014-10-11 ENCOUNTER — Ambulatory Visit (INDEPENDENT_AMBULATORY_CARE_PROVIDER_SITE_OTHER): Payer: BC Managed Care – PPO | Admitting: Family Medicine

## 2014-10-11 VITALS — BP 130/80 | Temp 98.2°F | Wt 176.0 lb

## 2014-10-11 DIAGNOSIS — E139 Other specified diabetes mellitus without complications: Secondary | ICD-10-CM | POA: Diagnosis not present

## 2014-10-11 DIAGNOSIS — I10 Essential (primary) hypertension: Secondary | ICD-10-CM

## 2014-10-11 NOTE — Progress Notes (Signed)
   Subjective:    Patient ID: [name] [name], male    DOB: August 30, 1950, 64 y.o.   MRN: [id number]  HPI [name] [name] is a 64 year old male who comes in today for follow-up of hypertension diabetes  We've increased his medicine to now he's taking Norvasc 10 mg daily, Hydrocort thiazide 12.5 mg daily, Cozaar 100 mg daily. BP down to 130/80  His blood sugars down to 120-130  range on metformin 1000 mg in the morning 500 mg prior to his evening meal and 16 units of Lantus in the evening. He's due for follow-up A1c in 2 months.  He has persistent bilateral lower extremity edema. It's decreased markedly since his blood sugar and blood pressure of normalized. It's still involve both calves.   Review of Systems    review of systems otherwise negative Objective:   Physical Exam Vital signs stable he is afebrile BP today 130/80 blood sugar 120 this morning.  3+ pedal edema bilaterally      Assessment & Plan:  Hypertension at goal....... continue current medication follow-up in 2 months  Diabetes type 1......Marland Kitchen blood sugar goal........ continue current therapy.......... follow-up in 2 months with [facility]  Chronic venous insufficiency lower extremity........ consult with [name] [name]

## 2014-10-11 NOTE — Progress Notes (Signed)
Pre visit review using our clinic review tool, if applicable. No additional management support is needed unless otherwise documented below in the visit note. 

## 2014-10-11 NOTE — Patient Instructions (Signed)
Continue current treatment program diet and exercise and medication  Check your blood sugar and blood pressure weekly since the both normal  Follow-up in 2 months with Beaulah Dinning,,,,,,,,, our new adult nurse practitioner from [facility]  Fasting labs one week prior to your next visit with Tommi Rumps

## 2014-10-12 ENCOUNTER — Telehealth: Payer: Self-pay

## 2014-11-27 ENCOUNTER — Other Ambulatory Visit (INDEPENDENT_AMBULATORY_CARE_PROVIDER_SITE_OTHER): Payer: BC Managed Care – PPO

## 2014-11-27 DIAGNOSIS — E139 Other specified diabetes mellitus without complications: Secondary | ICD-10-CM

## 2014-11-27 LAB — LIPID PANEL
Cholesterol: 245 mg/dL — ABNORMAL HIGH (ref 0–200)
HDL: 60.5 mg/dL (ref >39.00)
LDL Cholesterol: 162 mg/dL — ABNORMAL HIGH (ref 0–99)
NonHDL: 184.5
Total CHOL/HDL Ratio: 4
Triglycerides: 111 mg/dL (ref 0.0–149.0)
VLDL: 22.2 mg/dL (ref 0.0–40.0)

## 2014-11-27 LAB — BASIC METABOLIC PANEL
BUN: 27 mg/dL — ABNORMAL HIGH (ref 6–23)
CO2: 30 mEq/L (ref 19–32)
Calcium: 9.4 mg/dL (ref 8.4–10.5)
Chloride: 105 mEq/L (ref 96–112)
Creatinine, Ser: 1.7 mg/dL — ABNORMAL HIGH (ref 0.40–1.50)
GFR: 52.14 mL/min — AB (ref >60.00)
Glucose, Bld: 217 mg/dL — ABNORMAL HIGH (ref 70–99)
Potassium: 4.9 mEq/L (ref 3.5–5.1)
SODIUM: 138 meq/L (ref 135–145)

## 2014-11-27 LAB — MICROALBUMIN / CREATININE URINE RATIO
CREATININE, U: 125.8 mg/dL
MICROALB UR: 243.7 mg/dL — AB (ref 0.0–1.9)
Microalb Creat Ratio: 193.7 mg/g — ABNORMAL HIGH (ref 0.0–30.0)

## 2014-11-27 LAB — HEMOGLOBIN A1C: Hgb A1c MFr Bld: 8.3 % — ABNORMAL HIGH (ref 4.6–6.5)

## 2014-12-04 ENCOUNTER — Other Ambulatory Visit: Payer: BC Managed Care – PPO

## 2014-12-11 ENCOUNTER — Ambulatory Visit: Payer: BC Managed Care – PPO | Admitting: Adult Health

## 2014-12-11 ENCOUNTER — Ambulatory Visit (INDEPENDENT_AMBULATORY_CARE_PROVIDER_SITE_OTHER): Payer: BC Managed Care – PPO | Admitting: Adult Health

## 2014-12-11 ENCOUNTER — Encounter: Payer: Self-pay | Admitting: Adult Health

## 2014-12-11 VITALS — BP 160/90 | Temp 98.5°F | Ht 70.0 in | Wt 168.6 lb

## 2014-12-11 DIAGNOSIS — E7800 Pure hypercholesterolemia, unspecified: Secondary | ICD-10-CM

## 2014-12-11 DIAGNOSIS — I10 Essential (primary) hypertension: Secondary | ICD-10-CM | POA: Diagnosis not present

## 2014-12-11 DIAGNOSIS — E1165 Type 2 diabetes mellitus with hyperglycemia: Secondary | ICD-10-CM

## 2014-12-11 DIAGNOSIS — E7849 Other hyperlipidemia: Secondary | ICD-10-CM | POA: Insufficient documentation

## 2014-12-11 DIAGNOSIS — IMO0002 Reserved for concepts with insufficient information to code with codable children: Secondary | ICD-10-CM

## 2014-12-11 MED ORDER — HYDROCHLOROTHIAZIDE 25 MG PO TABS: 25.0000 mg | ORAL_TABLET | Freq: Every day | ORAL | Status: DC

## 2014-12-11 MED ORDER — ATORVASTATIN CALCIUM 10 MG PO TABS: 10.0000 mg | ORAL_TABLET | Freq: Every day | ORAL | Status: DC

## 2014-12-11 NOTE — Patient Instructions (Signed)
It was great meeting you today!  I have sent in a prescription for Lipitor, start taking this daily. If you develop muscle pain, stop the medication and notify me  I have also sent in a new prescription for HCTZ, take 25mg  daily. This will lower your blood pressure and take away the swelling in the legs.   Please start checking your blood pressure and blood sugars at home. Bring your journal with you to the next visit.   Follow up in one month so we can check in on your blood pressure.

## 2014-12-11 NOTE — Progress Notes (Signed)
Subjective:    Patient ID: [name] [name], male    DOB: [birth year], 64 y.o.   MRN: [id number]  HPI 64 year old male presents to the office today for follow up regarding his diabetes and hypertension. He was last seen by MD Sherren Mocha on 10/11/2014.   On follow up today we reviewed his labs. His A1c has come down significantly, he is still not at goal though. His cholesterol and LDL continue to be elevated as well.   His blood pressure is elevated at 160/90, he denies missing any doses of his medication. Does not have any headaches, blurred vision or dizziness.   Denies any other complaints at this time.   Review of Systems  Constitutional: Negative.   Respiratory: Negative.   Cardiovascular: Negative.   Gastrointestinal: Negative.   Genitourinary: Negative.   Musculoskeletal: Negative.   Neurological: Negative.   All other systems reviewed and are negative.  Past Medical History  Diagnosis Date  . Hypertension   . ED (erectile dysfunction)   . Peripheral neuropathy   . Diabetes mellitus     type I and type II  . Chronic kidney disease     "starting to bother me now" (07/28/2012)    History   Social History  . Marital Status: Married    Spouse Name: N/A  . Number of Children: N/A  . Years of Education: N/A   Occupational History  .      [employer]   Social History Main Topics  . Smoking status: Former Smoker -- 0.12 packs/day for 10 years    Types: Cigarettes  . Smokeless tobacco: Never Used     Comment: 07/28/2012 "quit smoking cigarettes 20-30 yr ago"  . Alcohol Use: 2.4 oz/week    4 Cans of beer per week     Comment: daily to less than one a month  . Drug Use: No  . Sexual Activity: Not Currently   Other Topics Concern  . Not on file   Social History Narrative    Past Surgical History  Procedure Laterality Date  . Appendectomy  1950's    Family History  Problem Relation Age of Onset  . Hypertension    . Diabetes    . Colon polyps Mother       No Known Allergies  Current Outpatient Prescriptions on File Prior to Visit  Medication Sig Dispense Refill  . amLODipine (NORVASC) 10 MG tablet Take 1 tablet (10 mg total) by mouth daily. 90 tablet 3  . aspirin 81 MG tablet Take 1 tablet (81 mg total) by mouth daily. 30 tablet   . B-D ULTRAFINE III SHORT PEN 31G X 8 MM MISC   5  . LANTUS SOLOSTAR 100 UNIT/ML Solostar Pen Inject 16 Units into the skin daily at 10 pm. 16 units at bedtime 5 pen 11  . losartan (COZAAR) 100 MG tablet Take 1 tablet (100 mg total) by mouth daily. 90 tablet 3  . metFORMIN (GLUCOPHAGE) 1000 MG tablet One tablet in the morning and a half a tab prior to your evening meal 200 tablet 3   No current facility-administered medications on file prior to visit.    BP 160/90 mmHg  Temp(Src) 98.5 F (36.9 C) (Oral)  Ht 5\' 10"  (1.778 m)  Wt 168 lb 9.6 oz (76.476 kg)  BMI 24.19 kg/m2       Objective:   Physical Exam  Constitutional: He is oriented to person, place, and time. He appears well-developed and  well-nourished. No distress.  HENT:  Head: Normocephalic and atraumatic.  Right Ear: External ear normal.  Left Ear: External ear normal.  Nose: Nose normal.  Mouth/Throat: Oropharynx is clear and moist. No oropharyngeal exudate.  Cardiovascular: Normal rate, regular rhythm, normal heart sounds and intact distal pulses.  Exam reveals no gallop and no friction rub.   No murmur heard. No carotid bruit  Pulmonary/Chest: Effort normal and breath sounds normal. No respiratory distress. He has no wheezes. He has no rales. He exhibits no tenderness.  Musculoskeletal: Normal range of motion. He exhibits no edema or tenderness.  Neurological: He is alert and oriented to person, place, and time. He has normal reflexes.  Skin: Skin is warm and dry. No rash noted. He is not diaphoretic. No erythema. No pallor.  Psychiatric: He has a normal mood and affect. His behavior is normal. Judgment and thought content normal.   Nursing note and vitals reviewed.      Assessment & Plan:  1. Essential hypertension - hydrochlorothiazide (HYDRODIURIL) 25 MG tablet; Take 1 tablet (25 mg total) by mouth daily.  Dispense: 90 tablet; Refill: 3 - Monitor blood pressure at home and record. Bring to next visit.  - Follow up in one month for follow up - Follow up in three months for CPE  2. Hypercholesteremia - atorvastatin (LIPITOR) 10 MG tablet; Take 1 tablet (10 mg total) by mouth daily.  Dispense: 90 tablet; Refill: 3 - Work on diabetic diet.   3. Diabetes type 2, uncontrolled - A1c is coming down. Continue with current medication regimen.  - Work on diabetic diet.  - Follow up in 3 months for A1c - Monitor blood sugars and bring log to next visit.

## 2014-12-11 NOTE — Progress Notes (Signed)
Pre visit review using our clinic review tool, if applicable. No additional management support is needed unless otherwise documented below in the visit note. 

## 2015-01-08 ENCOUNTER — Ambulatory Visit (INDEPENDENT_AMBULATORY_CARE_PROVIDER_SITE_OTHER): Payer: BC Managed Care – PPO | Admitting: Adult Health

## 2015-01-08 ENCOUNTER — Encounter: Payer: Self-pay | Admitting: Adult Health

## 2015-01-08 VITALS — BP 142/94 | Temp 98.2°F | Ht 70.0 in | Wt 163.9 lb

## 2015-01-08 DIAGNOSIS — I10 Essential (primary) hypertension: Secondary | ICD-10-CM | POA: Diagnosis not present

## 2015-01-08 LAB — BASIC METABOLIC PANEL
BUN: 26 mg/dL — AB (ref 6–23)
CO2: 33 mEq/L — ABNORMAL HIGH (ref 19–32)
Calcium: 9.6 mg/dL (ref 8.4–10.5)
Chloride: 105 mEq/L (ref 96–112)
Creatinine, Ser: 1.49 mg/dL (ref 0.40–1.50)
GFR: 60.68 mL/min (ref >60.00)
Glucose, Bld: 157 mg/dL — ABNORMAL HIGH (ref 70–99)
Potassium: 5.2 mEq/L — ABNORMAL HIGH (ref 3.5–5.1)
Sodium: 139 mEq/L (ref 135–145)

## 2015-01-08 NOTE — Patient Instructions (Signed)
It was great seeing you again!   Please go down to the lab for one blood test. If anything is wrong I will call you.   Start taking 1.5 pills of the HCTZ. Take a full pill in the morning and half a pill in the evening  Follow up in October for physical exam

## 2015-01-08 NOTE — Progress Notes (Signed)
Subjective:    Patient ID: JAHMEL TINA, male    DOB: 1951-03-05, 64 y.o.   MRN: [id number]  HPI   64 year old male, patient of Dr. Sherren Mocha, who presents to the office today for follow up regarding his blood pressure. One month ago, we increased his HCTZ to 25mg . During his last visit his blood pressure was 160/90. He endorses that he has been checking his blood pressures at home and they have been in the 140/90 range. He denies missing any medications.   He denies headaches, dizziness or lightheadedness.   He eats a low sodium diet  Review of Systems  Constitutional: Negative.   Respiratory: Negative.   Cardiovascular: Negative.   Gastrointestinal: Negative.   Musculoskeletal: Negative.   Neurological: Negative.   All other systems reviewed and are negative.  Past Medical History  Diagnosis Date  . Hypertension   . ED (erectile dysfunction)   . Peripheral neuropathy   . Diabetes mellitus     type I and type II  . Chronic kidney disease     "starting to bother me now" (07/28/2012)    Social History   Social History  . Marital Status: Married    Spouse Name: N/A  . Number of Children: N/A  . Years of Education: N/A   Occupational History  .      [employer]   Social History Main Topics  . Smoking status: Former Smoker -- 0.12 packs/day for 10 years    Types: Cigarettes  . Smokeless tobacco: Never Used     Comment: 07/28/2012 "quit smoking cigarettes 20-30 yr ago"  . Alcohol Use: 2.4 oz/week    4 Cans of beer per week     Comment: daily to less than one a month  . Drug Use: No  . Sexual Activity: Not Currently   Other Topics Concern  . Not on file   Social History Narrative    Past Surgical History  Procedure Laterality Date  . Appendectomy  1950's    Family History  Problem Relation Age of Onset  . Hypertension    . Diabetes    . Colon polyps Mother     No Known Allergies  Current Outpatient Prescriptions on File Prior to Visit    Medication Sig Dispense Refill  . amLODipine (NORVASC) 10 MG tablet Take 1 tablet (10 mg total) by mouth daily. 90 tablet 3  . aspirin 81 MG tablet Take 1 tablet (81 mg total) by mouth daily. 30 tablet   . atorvastatin (LIPITOR) 10 MG tablet Take 1 tablet (10 mg total) by mouth daily. 90 tablet 3  . B-D ULTRAFINE III SHORT PEN 31G X 8 MM MISC   5  . hydrochlorothiazide (HYDRODIURIL) 25 MG tablet Take 1 tablet (25 mg total) by mouth daily. 90 tablet 3  . LANTUS SOLOSTAR 100 UNIT/ML Solostar Pen Inject 16 Units into the skin daily at 10 pm. 16 units at bedtime 5 pen 11  . losartan (COZAAR) 100 MG tablet Take 1 tablet (100 mg total) by mouth daily. 90 tablet 3  . metFORMIN (GLUCOPHAGE) 1000 MG tablet One tablet in the morning and a half a tab prior to your evening meal 200 tablet 3   No current facility-administered medications on file prior to visit.    BP 142/94 mmHg  Temp(Src) 98.2 F (36.8 C) (Oral)  Ht 5\' 10"  (1.778 m)  Wt 163 lb 14.4 oz (74.345 kg)  BMI 23.52 kg/m2  Objective:   Physical Exam  Constitutional: He is oriented to person, place, and time. He appears well-developed and well-nourished. No distress.  Cardiovascular: Normal rate, regular rhythm, normal heart sounds and intact distal pulses.  Exam reveals no gallop and no friction rub.   No murmur heard. Pulmonary/Chest: Effort normal and breath sounds normal. No respiratory distress. He has no wheezes. He has no rales. He exhibits no tenderness.  Musculoskeletal: He exhibits no edema or tenderness.  Neurological: He is alert and oriented to person, place, and time.  Skin: Skin is warm and dry. No rash noted. He is not diaphoretic. No erythema. No pallor.  Psychiatric: He has a normal mood and affect. His behavior is normal. Judgment and thought content normal.  Nursing note and vitals reviewed.     Assessment & Plan:  1. Essential hypertension - Basic metabolic panel - Increase HCTZ to 1 pill in the morning  and 1/2 pill in the evening.

## 2015-01-08 NOTE — Progress Notes (Signed)
Pre visit review using our clinic review tool, if applicable. No additional management support is needed unless otherwise documented below in the visit note. 

## 2015-03-05 ENCOUNTER — Encounter: Payer: Self-pay | Admitting: Family Medicine

## 2015-03-05 ENCOUNTER — Ambulatory Visit (INDEPENDENT_AMBULATORY_CARE_PROVIDER_SITE_OTHER): Payer: BC Managed Care – PPO | Admitting: Family Medicine

## 2015-03-05 VITALS — Temp 97.9°F | Wt 178.0 lb

## 2015-03-05 DIAGNOSIS — I10 Essential (primary) hypertension: Secondary | ICD-10-CM

## 2015-03-05 DIAGNOSIS — Z794 Long term (current) use of insulin: Secondary | ICD-10-CM | POA: Diagnosis not present

## 2015-03-05 DIAGNOSIS — E1165 Type 2 diabetes mellitus with hyperglycemia: Secondary | ICD-10-CM | POA: Diagnosis not present

## 2015-03-05 NOTE — Patient Instructions (Signed)
Norvasc 10 mg........Marland Kitchen 1 daily in the morning  Hydrocort thiazide 25 mg.......Marland Kitchen 1 daily in the morning  Cozaar 100 mg...........Marland Kitchen 1 daily in the morning  Increase your insulin to 20 units daily  Carbohydrate free diet...........Marland Kitchen fish chicken [place]........ baked broiled grilled........ fresh fruits.......Marland Kitchen Vegetables..... Water...Marland KitchenMarland KitchenMarland Kitchen sweet potatoes  Walk 30 minutes daily  Check your blood sugar and blood pressure every morning  We will get you set up a consult with Dr. Lorie Apley ASAP  When you see her take a record of all your blood pressure readings and blood sugar readings

## 2015-03-05 NOTE — Progress Notes (Signed)
   Subjective:    Patient ID: [name] [name], male    DOB: 1951/02/22, 64 y.o.   MRN: [id number]  HPI [name] [name] is a 64 year old married male nonsmoker who comes in today accompanied by his wife for follow-up of hypertension diabetes  We have been working with him over the last couple months to normalize his blood sugar and blood pressure. The problem her having is noncompliant with his medication. He does take his insulin but he doesn't take his blood sugar except maybe once a week. He also says he does take his metformin. His wife says he is not following a diet nor is he exercising as directed.  He's also not taking his blood pressure medicine on a daily basis   Review of Systems    review of systems otherwise negative Objective:   Physical Exam  Well-developed well-nourished male no acute distress vital signs stable he is afebrile BP 180/90      Assessment & Plan:  Diabetes not at goal........ patient noncompliant........Marland Kitchen referred endocrinology  Hypertension not at goal.......... stress taking blood pressure medication daily

## 2015-03-05 NOTE — Progress Notes (Signed)
Pre visit review using our clinic review tool, if applicable. No additional management support is needed unless otherwise documented below in the visit note. 

## 2015-03-22 ENCOUNTER — Other Ambulatory Visit (INDEPENDENT_AMBULATORY_CARE_PROVIDER_SITE_OTHER): Payer: BC Managed Care – PPO | Admitting: *Deleted

## 2015-03-22 ENCOUNTER — Ambulatory Visit (INDEPENDENT_AMBULATORY_CARE_PROVIDER_SITE_OTHER): Payer: BC Managed Care – PPO | Admitting: Internal Medicine

## 2015-03-22 ENCOUNTER — Encounter: Payer: Self-pay | Admitting: Internal Medicine

## 2015-03-22 VITALS — BP 140/80 | HR 82 | Temp 97.8°F | Resp 12 | Ht 70.0 in | Wt 177.0 lb

## 2015-03-22 DIAGNOSIS — Z794 Long term (current) use of insulin: Secondary | ICD-10-CM

## 2015-03-22 DIAGNOSIS — E1165 Type 2 diabetes mellitus with hyperglycemia: Secondary | ICD-10-CM

## 2015-03-22 LAB — POCT GLYCOSYLATED HEMOGLOBIN (HGB A1C): HEMOGLOBIN A1C: 7.7

## 2015-03-22 MED ORDER — LINAGLIPTIN 5 MG PO TABS: 5.0000 mg | ORAL_TABLET | Freq: Every day | ORAL | Status: DC

## 2015-03-22 MED ORDER — INSULIN PEN NEEDLE 32G X 4 MM MISC: Status: DC

## 2015-03-22 MED ORDER — METFORMIN HCL 1000 MG PO TABS: ORAL_TABLET | ORAL | Status: DC

## 2015-03-22 NOTE — Patient Instructions (Addendum)
Please decrease Lantus to 16 units at bedtime. Change Metformin to 500 mg in am and 1000 mg with dinner. Add Tradjenta 5 mg in am.  Please let me know if the sugars are consistently <80 or >200.  Please return in 3 months with your sugar log.   PATIENT INSTRUCTIONS FOR TYPE 2 DIABETES:  **Please join MyChart!** - see attached instructions about how to join if you have not done so already.  DIET AND EXERCISE Diet and exercise is an important part of diabetic treatment.  We recommended aerobic exercise in the form of brisk walking (working between 40-60% of maximal aerobic capacity, similar to brisk walking) for 150 minutes per week (such as 30 minutes five days per week) along with 3 times per week performing 'resistance' training (using various gauge rubber tubes with handles) 5-10 exercises involving the major muscle groups (upper body, lower body and core) performing 10-15 repetitions (or near fatigue) each exercise. Start at half the above goal but build slowly to reach the above goals. If limited by weight, joint pain, or disability, we recommend daily walking in a swimming pool with water up to waist to reduce pressure from joints while allow for adequate exercise.    BLOOD GLUCOSES Monitoring your blood glucoses is important for continued management of your diabetes. Please check your blood glucoses 2-4 times a day: fasting, before meals and at bedtime (you can rotate these measurements - e.g. one day check before the 3 meals, the next day check before 2 of the meals and before bedtime, etc.).   HYPOGLYCEMIA (low blood sugar) Hypoglycemia is usually a reaction to not eating, exercising, or taking too much insulin/ other diabetes drugs.  Symptoms include tremors, sweating, hunger, confusion, headache, etc. Treat IMMEDIATELY with 15 grams of Carbs: . 4 glucose tablets .  cup regular juice/soda . 2 tablespoons raisins . 4 teaspoons sugar . 1 tablespoon honey Recheck blood glucose in  15 mins and repeat above if still symptomatic/blood glucose <100.  RECOMMENDATIONS TO REDUCE YOUR RISK OF DIABETIC COMPLICATIONS: * Take your prescribed MEDICATION(S) * Follow a DIABETIC diet: Complex carbs, fiber rich foods, (monounsaturated and polyunsaturated) fats * AVOID saturated/trans fats, high fat foods, >2,300 mg salt per day. * EXERCISE at least 5 times a week for 30 minutes or preferably daily.  * DO NOT SMOKE OR DRINK more than 1 drink a day. * Check your FEET every day. Do not wear tightfitting shoes. Contact us if you develop an ulcer * See your EYE doctor once a year or more if needed * Get a FLU shot once a year * Get a PNEUMONIA vaccine once before and once after age 32 years  GOALS:  * Your Hemoglobin A1c of <7%  * fasting sugars need to be <130 * after meals sugars need to be <180 (2h after you start eating) * Your Systolic BP should be [id number] or lower  * Your Diastolic BP should be 80 or lower  * Your HDL (Good Cholesterol) should be 40 or higher  * Your LDL (Bad Cholesterol) should be 100 or lower. * Your Triglycerides should be 150 or lower  * Your Urine microalbumin (kidney function) should be <30 * Your Body Mass Index should be 25 or lower    Please consider the following ways to cut down carbs and fat and increase fiber and micronutrients in your diet: - substitute whole grain for white bread or pasta - substitute brown rice for white rice - substitute 90-calorie flat  bread pieces for slices of bread when possible - substitute sweet potatoes or yams for white potatoes - substitute humus for margarine - substitute tofu for cheese when possible - substitute almond or rice milk for regular milk (would not drink soy milk daily due to concern for soy estrogen influence on breast cancer risk) - substitute dark chocolate for other sweets when possible - substitute water - can add lemon or orange slices for taste - for diet sodas (artificial sweeteners will trick  your body that you can eat sweets without getting calories and will lead you to overeating and weight gain in the long run) - do not skip breakfast or other meals (this will slow down the metabolism and will result in more weight gain over time)  - can try smoothies made from fruit and almond/rice milk in am instead of regular breakfast - can also try old-fashioned (not instant) oatmeal made with almond/rice milk in am - order the dressing on the side when eating salad at a restaurant (pour less than half of the dressing on the salad) - eat as little meat as possible - can try juicing, but should not forget that juicing will get rid of the fiber, so would alternate with eating raw veg./fruits or drinking smoothies - use as little oil as possible, even when using olive oil - can dress a salad with a mix of balsamic vinegar and lemon juice, for e.g. - use agave nectar, stevia sugar, or regular sugar rather than artificial sweateners - steam or broil/roast veggies  - snack on veggies/fruit/nuts (unsalted, preferably) when possible, rather than processed foods - reduce or eliminate aspartame in diet (it is in diet sodas, chewing gum, etc) Read the labels!  Try to read Dr. Janene Harvey book: "Program for Reversing Diabetes" for other ideas for healthy eating.

## 2015-03-22 NOTE — Progress Notes (Addendum)
Patient ID: [name] [name], male   DOB: [birth year], 64 y.o.   MRN: [id number]  HPI: [name] [name] is a 64 y.o.-year-old AA male, referred by his PCP, Dr. Sherren Mocha, for management of DM2, dx in ~2001, insulin-dependent since 2010, uncontrolled, with complications (peripheral neuropathy, CKD).  Last hemoglobin A1c was: Lab Results  Component Value Date   HGBA1C 8.3* 11/27/2014   HGBA1C 14.5* 08/28/2014   HGBA1C >20.0* 08/01/2014   Pt is on a regimen of: - Metformin 1000 mg in am and 500 mg with dinner, with meals. No N/V/D.  - Lantus 20 units at bedtime Previously on Glipizide.  Pt checks his sugars 1x a day and they are: (He works 3 pm - 11:30 pm - eats dinner at 12 am) - am: 61, 87, 124-201 - 2h after b'fast: n/c - before lunch: n/c - 2h after lunch: n/c - before dinner: n/c - 2h after dinner: n/c - bedtime: n/c - nighttime: n/c He may feel "jittery at 7-8 pm" (when he skips meals) - while at work - got candy >> improved. Lowest sugar was 61; he has hypoglycemia awareness at 60.  Highest sugar was 201.  Glucometer: One Touch  Meals: - Breakfast: 3-4 eggs with cheese3 times a week/grits twice a week/sausage and egg twice a month - Lunch: not often: Hamburger, grilled chicken salad, hot dog  - Dinner: meat + veggies  - Snacks: none No regular exercise.  - + CKD, last BUN/creatinine:  Lab Results  Component Value Date   BUN 26* 01/08/2015   CREATININE 1.49 01/08/2015  On Losartan. - last set of lipids: Lab Results  Component Value Date   CHOL 245* 11/27/2014   HDL 60.50 11/27/2014   LDLCALC 162* 11/27/2014   LDLDIRECT 144.5 06/18/2011   TRIG 111.0 11/27/2014   CHOLHDL 4 11/27/2014  On Lipitor 10. - last eye exam was in 12/29/13. No DR.  - + numbness and tingling in his feet. Not on meds.  Pt has FH of DM in cousin, father.  He also has a history of hypertension and hyperlipidemia.  ROS: Constitutional: + weight loss, no fatigue, + subjective hypothermia, +  excessive urination and nocturia  Eyes: + blurry vision, no xerophthalmia ENT: no sore throat, no nodules palpated in throat, no dysphagia/odynophagia, no hoarseness Cardiovascular: no CP/SOB/palpitations/+ leg swelling Respiratory: + cough/no SOB Gastrointestinal: no N/V/+D/+C Musculoskeletal: no muscle/joint aches Skin: no rashes Neurological: no tremors/numbness/tingling/dizziness Psychiatric: no depression/anxiety  Past Medical History  Diagnosis Date  . Hypertension   . ED (erectile dysfunction)   . Peripheral neuropathy ([place])   . Diabetes mellitus     type I and type II  . Chronic kidney disease     "starting to bother me now" (07/28/2012)   Past Surgical History  Procedure Laterality Date  . Appendectomy  1950's   Social History   Social History  . Marital Status: Married    Spouse Name: N/A   Occupational History  . Night custodian     [employer]   Social History Main Topics  . Smoking status: Former Smoker -- 0.12 packs/day for 10 years, quit in 1980     Types: Cigarettes  . Smokeless tobacco: Never Used     Comment: 07/28/2012 "quit smoking cigarettes 20-30 yr ago"  . Alcohol Use: 2.4 oz/week    4 Cans of beer per week     Comment: daily to less than one a month  . Drug Use: No   Current Outpatient  Prescriptions on File Prior to Visit  Medication Sig Dispense Refill  . amLODipine (NORVASC) 10 MG tablet Take 1 tablet (10 mg total) by mouth daily. 90 tablet 3  . aspirin 81 MG tablet Take 1 tablet (81 mg total) by mouth daily. 30 tablet   . atorvastatin (LIPITOR) 10 MG tablet Take 1 tablet (10 mg total) by mouth daily. 90 tablet 3  . B-D ULTRAFINE III SHORT PEN 31G X 8 MM MISC   5  . hydrochlorothiazide (HYDRODIURIL) 25 MG tablet Take 1 tablet (25 mg total) by mouth daily. 90 tablet 3  . LANTUS SOLOSTAR 100 UNIT/ML Solostar Pen Inject 16 Units into the skin daily at 10 pm. 16 units at bedtime (Patient taking differently: Inject 20 Units into the skin  daily at 10 pm. 16 units at bedtime) 5 pen 11  . losartan (COZAAR) 100 MG tablet Take 1 tablet (100 mg total) by mouth daily. 90 tablet 3  . metFORMIN (GLUCOPHAGE) 1000 MG tablet One tablet in the morning and a half a tab prior to your evening meal 200 tablet 3   No current facility-administered medications on file prior to visit.   No Known Allergies Family History  Problem Relation Age of Onset  . Hypertension    . Diabetes    . Colon polyps Mother    PE: BP 140/80 mmHg  Pulse 82  Temp(Src) 97.8 F (36.6 C) (Oral)  Resp 12  Ht 5\' 10"  (1.778 m)  Wt 177 lb (80.287 kg)  BMI 25.40 kg/m2  SpO2 97% Wt Readings from Last 3 Encounters:  03/22/15 177 lb (80.287 kg)  03/05/15 178 lb (80.74 kg)  01/08/15 163 lb 14.4 oz (74.345 kg)   Constitutional: Normal weight, in NAD Eyes: PERRLA, EOMI, no exophthalmos ENT: moist mucous membranes, no thyromegaly, no cervical lymphadenopathy Cardiovascular: RRR, No MRG, + mild bilateral pitting edema Respiratory: CTA B Gastrointestinal: abdomen soft, NT, ND, BS+ Musculoskeletal: no deformities, strength intact in all 4 Skin: moist, warm, + skin abrasion on the right shin; very dry skin on legs  Neurological: no tremor with outstretched hands, DTR normal in all 4  ASSESSMENT: 1. DM2, insulin-dependent, uncontrolled, with complications - CKD - PN  PLAN:  1. Patient with long-standing, uncontrolled diabetes, on oral antidiabetic regimen with Metformin and also basal insulin. His diabetes control improved dramatically since the beginning of the year. Based on the fact that he responds to such a low dose of insulin and he has significant variability in his sugars, I suspect that he may have decreased insulin production. I plan to check a C-peptide and pancreatic antibodies at next visit. - For now, I will decrease his Lantus to 16 units since he has variability in her a.m. CBGs, and this can be a sign of excessive basal insulin. In the meantime, I  will increase the dose of metformin with dinner (to prevent increased hepatic glucose output overnight) and decrease the dose of metformin in the morning. He does have CKD, but this is mild, with the last GFR at 60.6, which allows [name] to use metformin. I will also add Tradjenta, since I expect that he needs mealtime coverage throughout the day. I do not have sugar checks throughout the day, so I advised him to start checking them. - I suggested to:  Patient Instructions  Please decrease Lantus to 16 units at bedtime. Change Metformin to 500 mg in am and 1000 mg with dinner. Add Tradjenta 5 mg in am.  Please let me  know if the sugars are consistently <80 or >200.  Please return in 3 months with your sugar log.   - Strongly advised him to start checking sugars at different times of the day - check 2 times a day, rotating checks - given sugar log and advised how to fill it and to bring it at next appt  - given foot care handout and explained the principles  - given instructions for hypoglycemia management "15-15 rule"  - advised for yearly eye exams  - Hemoglobin A1c was checked today and this is 7.7%, greatly decreased in the last few months! - I sent a prescription for shorter needles to his pharmacy  - Return to clinic in 1.5 mo with sugar log

## 2015-03-29 ENCOUNTER — Telehealth: Payer: Self-pay | Admitting: Internal Medicine

## 2015-03-29 NOTE — Telephone Encounter (Signed)
Patient called stating that his pharmacy is requesting a authorization on his Rx   Rx: [place]: [facility]    Thank you

## 2015-04-02 NOTE — Telephone Encounter (Signed)
PA sent in last week. Dr Cruzita Lederer completed another form sent by [facility]; we faxed it in this morning.

## 2015-04-09 ENCOUNTER — Other Ambulatory Visit: Payer: Self-pay | Admitting: *Deleted

## 2015-04-09 MED ORDER — SITAGLIPTIN PHOSPHATE 50 MG PO TABS: 50.0000 mg | ORAL_TABLET | Freq: Every day | ORAL | Status: DC

## 2015-05-22 ENCOUNTER — Other Ambulatory Visit (INDEPENDENT_AMBULATORY_CARE_PROVIDER_SITE_OTHER): Payer: BC Managed Care – PPO

## 2015-05-22 DIAGNOSIS — Z0000 Encounter for general adult medical examination without abnormal findings: Secondary | ICD-10-CM

## 2015-05-22 LAB — HEPATIC FUNCTION PANEL
ALK PHOS: 64 U/L (ref 39–117)
ALT: 12 U/L (ref 0–53)
AST: 14 U/L (ref 0–37)
Albumin: 3.6 g/dL (ref 3.5–5.2)
BILIRUBIN DIRECT: 0.1 mg/dL (ref 0.0–0.3)
BILIRUBIN TOTAL: 0.5 mg/dL (ref 0.2–1.2)
Total Protein: 6.8 g/dL (ref 6.0–8.3)

## 2015-05-22 LAB — POCT URINALYSIS DIPSTICK
BILIRUBIN UA: NEGATIVE
KETONES UA: NEGATIVE
Leukocytes, UA: NEGATIVE
Nitrite, UA: NEGATIVE
PH UA: 5
SPEC GRAV UA: 1.025
Urobilinogen, UA: 0.2

## 2015-05-22 LAB — HEMOGLOBIN A1C: HEMOGLOBIN A1C: 8.6 % — AB (ref 4.6–6.5)

## 2015-05-22 LAB — CBC WITH DIFFERENTIAL/PLATELET
BASOS ABS: 0.1 10*3/uL (ref 0.0–0.1)
BASOS PCT: 1.2 % (ref 0.0–3.0)
EOS ABS: 0.3 10*3/uL (ref 0.0–0.7)
EOS PCT: 3.9 % (ref 0.0–5.0)
HCT: 39.3 % (ref 39.0–52.0)
HEMOGLOBIN: 13 g/dL (ref 13.0–17.0)
LYMPHS PCT: 20.6 % (ref 12.0–46.0)
Lymphs Abs: 1.6 10*3/uL (ref 0.7–4.0)
MCHC: 33.2 g/dL (ref 30.0–36.0)
MCV: 86.7 fl (ref 78.0–100.0)
MONO ABS: 0.5 10*3/uL (ref 0.1–1.0)
Monocytes Relative: 6.9 % (ref 3.0–12.0)
Neutro Abs: 5.1 10*3/uL (ref 1.4–7.7)
Neutrophils Relative %: 67.4 % (ref 43.0–77.0)
Platelets: 291 10*3/uL (ref 150.0–400.0)
RBC: 4.53 Mil/uL (ref 4.22–5.81)
RDW: 12.5 % (ref 11.5–15.5)
WBC: 7.6 10*3/uL (ref 4.0–10.5)

## 2015-05-22 LAB — BASIC METABOLIC PANEL
BUN: 42 mg/dL — AB (ref 6–23)
CHLORIDE: 107 meq/L (ref 96–112)
CO2: 25 meq/L (ref 19–32)
Calcium: 9.4 mg/dL (ref 8.4–10.5)
Creatinine, Ser: 2.44 mg/dL — ABNORMAL HIGH (ref 0.40–1.50)
GFR: 34.31 mL/min — ABNORMAL LOW (ref >60.00)
Glucose, Bld: 166 mg/dL — ABNORMAL HIGH (ref 70–99)
POTASSIUM: 4.6 meq/L (ref 3.5–5.1)
Sodium: 140 mEq/L (ref 135–145)

## 2015-05-22 LAB — LIPID PANEL
CHOL/HDL RATIO: 4
Cholesterol: 252 mg/dL — ABNORMAL HIGH (ref 0–200)
HDL: 58.7 mg/dL (ref >39.00)
LDL CALC: 154 mg/dL — AB (ref 0–99)
NonHDL: 193.46
Triglycerides: 196 mg/dL — ABNORMAL HIGH (ref 0.0–149.0)
VLDL: 39.2 mg/dL (ref 0.0–40.0)

## 2015-05-22 LAB — MICROALBUMIN / CREATININE URINE RATIO
Creatinine,U: 118.1 mg/dL
Microalb Creat Ratio: 74.7 mg/g — ABNORMAL HIGH (ref 0.0–30.0)
Microalb, Ur: 88.2 mg/dL — ABNORMAL HIGH (ref 0.0–1.9)

## 2015-05-22 LAB — TSH: TSH: 2.22 u[IU]/mL (ref 0.35–4.50)

## 2015-05-22 LAB — PSA: PSA: 4.49 ng/mL — AB (ref 0.10–4.00)

## 2015-05-29 ENCOUNTER — Ambulatory Visit (INDEPENDENT_AMBULATORY_CARE_PROVIDER_SITE_OTHER): Payer: BC Managed Care – PPO | Admitting: Family Medicine

## 2015-05-29 ENCOUNTER — Encounter: Payer: Self-pay | Admitting: Family Medicine

## 2015-05-29 VITALS — Temp 98.1°F | Ht 70.5 in | Wt 180.0 lb

## 2015-05-29 DIAGNOSIS — I10 Essential (primary) hypertension: Secondary | ICD-10-CM

## 2015-05-29 DIAGNOSIS — Z0000 Encounter for general adult medical examination without abnormal findings: Secondary | ICD-10-CM

## 2015-05-29 DIAGNOSIS — Z794 Long term (current) use of insulin: Principal | ICD-10-CM

## 2015-05-29 DIAGNOSIS — E139 Other specified diabetes mellitus without complications: Secondary | ICD-10-CM

## 2015-05-29 DIAGNOSIS — E1165 Type 2 diabetes mellitus with hyperglycemia: Secondary | ICD-10-CM | POA: Diagnosis not present

## 2015-05-29 DIAGNOSIS — Z23 Encounter for immunization: Secondary | ICD-10-CM

## 2015-05-29 DIAGNOSIS — E7800 Pure hypercholesterolemia, unspecified: Secondary | ICD-10-CM

## 2015-05-29 LAB — POCT GLUCOSE (DEVICE FOR HOME USE): POC GLUCOSE: 164 mg/dL — AB (ref 70–99)

## 2015-05-29 MED ORDER — LOSARTAN POTASSIUM 100 MG PO TABS: 100.0000 mg | ORAL_TABLET | Freq: Every day | ORAL | Status: DC

## 2015-05-29 MED ORDER — INSULIN GLARGINE 100 UNIT/ML SOLOSTAR PEN: 16.0000 [IU] | PEN_INJECTOR | Freq: Every day | SUBCUTANEOUS | Status: DC

## 2015-05-29 MED ORDER — AMLODIPINE BESYLATE 10 MG PO TABS: 10.0000 mg | ORAL_TABLET | Freq: Every day | ORAL | Status: DC

## 2015-05-29 NOTE — Progress Notes (Signed)
Pre visit review using our clinic review tool, if applicable. No additional management support is needed unless otherwise documented below in the visit note. 

## 2015-05-29 NOTE — Progress Notes (Signed)
Subjective:    Patient ID: [name] [name], male    DOB: 05/02/1951, 65 y.o.   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 Pace post been on 16 units of Lantus at bedtime. He hasn't take his insulin a week because he got a letter this insurance won't cover the Lantus anymore. He hasn't called his endocrinologist Dr. Lorie [name] yet.  He's been off his Norvasc Hydrocort thiazide and Cozaar for a week. BP today 200/100.. We merely gave him clonidine 0.2 and by systolic 5 mg. We'll keep him here at bedrest at the office until his blood pressure drops and then restart all his other medications and have him rest at home for couple days until his blood pressure normalizes. We discussed the fact that blood pressure readings of this elevation, stroke heart attack and kidney failure  He also takes Lipitor and aspirin HDL 59 and LDL 154. Blood sugar week ago 166 with an A1c of 8.6. He also has 3+ proteinuria.  He had his last eye exam at [facility]. Advised to see an ophthalmologist on a yearly basis, no regular dental care, colonoscopy in 2012 as reported to be normal.  Vaccinations updated by [name] [name]  He does have a glucometer and a blood pressure cuff at home however he has not been checking his blood pressure nor his blood sugar. He's been advised in the past to check both daily   Review of Systems  Constitutional: Negative.   HENT: Negative.   Eyes: Negative.   Respiratory: Negative.   Cardiovascular: Negative.   Gastrointestinal: Negative.   Endocrine: Negative.   Genitourinary: Negative.   Musculoskeletal: Negative.   Skin: Negative.   Allergic/Immunologic: Negative.   Neurological: Negative.   Hematological: Negative.   Psychiatric/Behavioral: Negative.        Objective:   Physical  Exam  Constitutional: He is oriented to person, place, and time. He appears well-developed and well-nourished.  Bilateral cataracts  HENT:  Head: Normocephalic and atraumatic.  Right Ear: External ear normal.  Left Ear: External ear normal.  Nose: Nose normal.  Mouth/Throat: Oropharynx is clear and moist.  Eyes: Conjunctivae and EOM are normal. Pupils are equal, round, and reactive to light.  Neck: Normal range of motion. Neck supple. No JVD present. No tracheal deviation present. No thyromegaly present.  Cardiovascular: Normal rate, regular rhythm, normal heart sounds and intact distal pulses.  Exam reveals no gallop and no friction rub.   No murmur heard. No carotid nor aortic bruits peripheral pulses 1+ and symmetrical  Pulmonary/Chest: Effort normal and breath sounds normal. No stridor. No respiratory distress. He has no wheezes. He has no rales. He exhibits no tenderness.  Abdominal: Soft. Bowel sounds are normal. He exhibits no distension and no mass. There is no tenderness. There is no rebound and no guarding.  Genitourinary: Rectum normal, prostate normal and penis normal. Guaiac negative stool. No penile tenderness.  Musculoskeletal: Normal range of motion. He exhibits no edema or tenderness.  Lymphadenopathy:    He has no cervical adenopathy.  Neurological: He is alert and oriented to person, place, and time. He has normal reflexes. No cranial nerve deficit. He exhibits normal muscle tone.  Skin: Skin is warm and dry. No rash noted. No erythema. No pallor.  Skin exam normal except for all 10  toenails her and claimed and enlarged with a fungal infection  Psychiatric: He has a normal mood and affect. His behavior is normal. Judgment and thought content normal.  Nursing note and vitals reviewed.   Blood sugar 166      Assessment & Plan:  Diabetes type 1,,,,,,,, samples of Lantus given until he can contact his endocrinologist and switched to the covered  product  Hypertension,,,,,,,, malignant,,,,,,,,,, clonidine 0.2 and by systolic 5 mg given immediately,,,,,,,,, BP check every 15 minutes until blood pressure begins to drop,,,,,,,,, then rest at home and resume his previous medications. Follow-up at noon tomorrow  Hyperlipidemia.......... continue Lipitor and aspirin

## 2015-05-29 NOTE — Patient Instructions (Addendum)
When you get home take the Hydrocort thiazide 25 mg........ one tablet, Cozaar 100 mg........ one tablet, Norvasc 10 mg.......... one tablet  Also take all 3 medications tomorrow morning.  Bed rest at home...Marland KitchenMarland KitchenMarland Kitchen get up to go the bathroom and get up to eat otherwise stated complete bed rest  No salt diet  Check your blood pressure 3 times daily  Return tomorrow to the office at noon for follow-up  16 units  insulin.......... you've been given 2 sample bottles........ call you your endocrinologist today to get the new insulin to your insurance will cover  Soak her feet in warm water for 10 minutes weekly......... heavy wife filed down all your nails

## 2015-05-30 ENCOUNTER — Ambulatory Visit (INDEPENDENT_AMBULATORY_CARE_PROVIDER_SITE_OTHER): Payer: BC Managed Care – PPO | Admitting: Family Medicine

## 2015-05-30 VITALS — Temp 98.2°F | Wt 178.0 lb

## 2015-05-30 DIAGNOSIS — I10 Essential (primary) hypertension: Secondary | ICD-10-CM

## 2015-05-30 NOTE — Progress Notes (Signed)
Pre visit review using our clinic review tool, if applicable. No additional management support is needed unless otherwise documented below in the visit note. 

## 2015-05-30 NOTE — Patient Instructions (Signed)
Continue bedrest today and Friday and Saturday  Sunday resume your normal activities  Return here on Monday for follow-up  Take all 3 of your blood pressure medications every morning when you first get out of bed  Check your blood pressure at home 3 times daily  When you return on Monday bring a record of all your blood pressure readings and the device

## 2015-05-30 NOTE — Progress Notes (Signed)
   Subjective:    Patient ID: [name] [name], male    DOB: 01/16/1951, 65 y.o.   MRN: [id number]  HPI [name] [name] is a 65 year old married male nonsmoker who comes in today accompanied by his wife for evaluation of hypertension  We saw him yesterday he had not taken his blood pressure medicine for a week his BP was 200/100. We kept him here for about 9 a half gave him 0.2 of clonidine and 5 mg of a beta blocker care. Blood pressure dropped to 170/90. We asked him to go home take all 3 of his blood pressure medications and stay at bedrest. He took his medication just day but hasn't taken in today for reasons I don't understand. He was told in the future take all 3 blood pressure medications every morning when he first gets out of bed.  BP when he came in 180/100 after 10 minutes arrest 160/90   Review of Systems Review of systems negative    Objective:   Physical Exam  Well-developed well-nourished male no acute distress vital signs stable he is afebrile except for BP 180/100 initially now down to 160/90      Assessment & Plan:  Hypertension not at goal............ continue bedrest and medication follow-up on Monday

## 2015-06-03 ENCOUNTER — Ambulatory Visit (INDEPENDENT_AMBULATORY_CARE_PROVIDER_SITE_OTHER): Payer: BC Managed Care – PPO | Admitting: Family Medicine

## 2015-06-03 VITALS — BP 170/90 | Temp 98.2°F | Wt 174.0 lb

## 2015-06-03 DIAGNOSIS — I10 Essential (primary) hypertension: Secondary | ICD-10-CM

## 2015-06-03 DIAGNOSIS — Z0000 Encounter for general adult medical examination without abnormal findings: Secondary | ICD-10-CM | POA: Diagnosis not present

## 2015-06-03 MED ORDER — METOPROLOL TARTRATE 25 MG PO TABS: 25.0000 mg | ORAL_TABLET | Freq: Two times a day (BID) | ORAL | Status: DC

## 2015-06-03 NOTE — Patient Instructions (Signed)
Continue the Norvasc 10 mg, hydrochlorothiazide 25 mg and Cozaar 100 mg............. One of each every morning  Ed Lopressor 25 mg...........Marland Kitchen 1 tablet in the morning   Check your blood pressure  In the morning and at bedtime  Return in one week for follow-up  Okay to work

## 2015-06-03 NOTE — Progress Notes (Signed)
Pre visit review using our clinic review tool, if applicable. No additional management support is needed unless otherwise documented below in the visit note. 

## 2015-06-03 NOTE — Progress Notes (Signed)
   Subjective:    Patient ID: [name] [name], male    DOB: [birth year], 65 y.o.   MRN: [id number]  HPI  [name] [name] is a 65 year old married male nonsmoker comes in today for follow-up of hypertension  His noticed previously we saw him couple weeks ago he had stopped taking his blood pressure medicine for a week. His blood pressure rose dramatically. 190/100. We kept him here for couple hours gave him clonidine and the beta blocker. We then sent him home on and stay complete bed rest for 3 days and take his medication. Yesterday we will let him get out and walk around. BP today 153/93.   Review of Systems     Review of systems otherwise negative Objective:   Physical Exam   well-developed well-nourished male no acute distress vital signs stable is afebrile BP right arm sitting position 153/93      Assessment & Plan:   hypertension not at goal a 25 mg of beta blocker continue other meds follow-up in 2 weeks

## 2015-06-11 ENCOUNTER — Encounter: Payer: Self-pay | Admitting: Family Medicine

## 2015-06-11 ENCOUNTER — Ambulatory Visit (INDEPENDENT_AMBULATORY_CARE_PROVIDER_SITE_OTHER): Payer: BC Managed Care – PPO | Admitting: Family Medicine

## 2015-06-11 VITALS — BP 140/90 | Temp 98.2°F | Wt 173.0 lb

## 2015-06-11 DIAGNOSIS — E1165 Type 2 diabetes mellitus with hyperglycemia: Secondary | ICD-10-CM | POA: Diagnosis not present

## 2015-06-11 DIAGNOSIS — I10 Essential (primary) hypertension: Secondary | ICD-10-CM | POA: Diagnosis not present

## 2015-06-11 DIAGNOSIS — Z794 Long term (current) use of insulin: Secondary | ICD-10-CM

## 2015-06-11 MED ORDER — METFORMIN HCL 1000 MG PO TABS: ORAL_TABLET | ORAL | Status: DC

## 2015-06-11 MED ORDER — INSULIN GLARGINE 100 UNIT/ML SOLOSTAR PEN: PEN_INJECTOR | SUBCUTANEOUS | Status: DC

## 2015-06-11 NOTE — Progress Notes (Signed)
Pre visit review using our clinic review tool, if applicable. No additional management support is needed unless otherwise documented below in the visit note. 

## 2015-06-11 NOTE — Progress Notes (Signed)
   Subjective:    Patient ID: [name] [name], male    DOB: [birth year], 65 y.o.   MRN: [id number]  HPI [name] [name] is a 65 year old male married nonsmoker who comes in today for follow-up of hypertension diabetes  His noticed she previously 9 not taking his medication. He came here a couple weeks ago his blood pressure was markedly elevated systolic was [id number] diastolic was [id number]. We gave him medication here we kept him at bedrest for 4 days and changed his medication his blood pressures gradually coming back to normal. BP today 140/90 on Norvasc 10 mg, Hydrocort thiazide 25, Cozaar 100, and Lopressor 25 mg twice a day  His A1c 2 weeks ago was 8.6%. He's on Lantus 16 units daily metformin 500 mg in the morning and thousand milligrams at bedtime. He's thin his weight is 173 pounds BMI is 24.61   Review of Systems Review of systems otherwise negative    Objective:   Physical Exam Well-developed well-nourished male no acute distress vital signs stable he is afebrile BP right arm sitting position 140/90  Blood sugar this morning at home 149 fasting       Assessment & Plan:  Hypertension approaching goal........... continue current medication....... BP check daily follow-up in 4-6 weeks  Diabetes type 1 not at goal.......... increase metformin 2000 mg twice a day....... increase insulin to 20 units daily......... fasting blood sugar daily in the morning follow-up in 4-6 weeks.Marland Kitchen

## 2015-06-11 NOTE — Patient Instructions (Signed)
Continue current blood pressure medications  Check your blood pressure daily in the morning,,,,,,,,, continue to keep a log of all your blood pressure readings  Return in 4-6 weeks for follow-up,,,,,,,, bring a record of all your blood pressure readings and the device  Your A1c was 8.6%,,,,,,,,, should be 7.0,,,,,,,,,,,, therefore increase your metformin to take a full thousand milligrams pill twice daily,,,,, and increase your insulin to 20 units at bedtime  Fasting blood sugar daily in the morning  Follow-up in 4-6 weeks

## 2015-06-17 ENCOUNTER — Ambulatory Visit: Payer: BC Managed Care – PPO | Admitting: Family Medicine

## 2015-06-18 LAB — HM DIABETES EYE EXAM

## 2015-06-19 LAB — HM DIABETES EYE EXAM

## 2015-06-21 ENCOUNTER — Encounter: Payer: Self-pay | Admitting: Internal Medicine

## 2015-06-21 ENCOUNTER — Ambulatory Visit (INDEPENDENT_AMBULATORY_CARE_PROVIDER_SITE_OTHER): Payer: BC Managed Care – PPO | Admitting: Internal Medicine

## 2015-06-21 DIAGNOSIS — IMO0002 Reserved for concepts with insufficient information to code with codable children: Secondary | ICD-10-CM

## 2015-06-21 DIAGNOSIS — E1121 Type 2 diabetes mellitus with diabetic nephropathy: Secondary | ICD-10-CM

## 2015-06-21 DIAGNOSIS — Z794 Long term (current) use of insulin: Secondary | ICD-10-CM

## 2015-06-21 DIAGNOSIS — E1165 Type 2 diabetes mellitus with hyperglycemia: Secondary | ICD-10-CM | POA: Diagnosis not present

## 2015-06-21 DIAGNOSIS — E119 Type 2 diabetes mellitus without complications: Secondary | ICD-10-CM | POA: Insufficient documentation

## 2015-06-21 MED ORDER — INSULIN GLARGINE 100 UNIT/ML SOLOSTAR PEN: 20.0000 [IU] | PEN_INJECTOR | Freq: Every day | SUBCUTANEOUS | Status: DC

## 2015-06-21 NOTE — Progress Notes (Signed)
Patient ID: [name] [name], male   DOB: 1951/05/18, 65 y.o.   MRN: [id number]  HPI: [name] [name] is a 65 y.o.-year-old AA male,returning for f/u for DM2, dx in ~2001, insulin-dependent since 2010, uncontrolled, with complications (peripheral neuropathy, CKD). Last visit 3 mo ago.  Recently seen by PCP >> HTN, decreased GFR, and higher HbA1c [med dose adjusted (see below)].  He mentions that he was not taking his DM meds as Rx'ed: Was only taking the doses off and on.   Last hemoglobin A1c was: Lab Results  Component Value Date   HGBA1C 8.6* 05/22/2015   HGBA1C 7.7 03/22/2015   HGBA1C 8.3* 11/27/2014   Pt is on a regimen of: - Metformin 1000 mg in am and 500 mg with dinner (was advised to increase to 1000 mg 2x a day >> he misunderstood instructions) - Lantus 20 units at bedtime (increased this mo from 16 units) We tried to start Tradjenta 5 mg daily in am >> could not afford >> Januvia 50 mg >> did not pick this up. Previously on Glipizide.  Pt checks his sugars 1x a day and they are: (He works 3 pm - 11:30 pm - eats dinner at 12 am)  - am: 61, 87, 124-201 >> 64-100, 156 (ate at 3 am), 242x1 - 2h after b'fast: n/c - before lunch: n/c - 2h after lunch: n/c - before dinner: n/c - 2h after dinner: n/c - bedtime: n/c - nighttime: n/c He may feel "jittery at 7-8 pm" (when he skips meals) - while at work - got candy >> improved. Lowest sugar was 61 >> 65; he has hypoglycemia awareness at 60.  Highest sugar was 201 >> 242.  Glucometer: One Touch  Meals: - Breakfast: 3-4 eggs with cheese3 times a week/grits twice a week/sausage and egg twice a month - Lunch: not often: Hamburger, grilled chicken salad, hot dog  - Dinner: meat + veggies  - Snacks: none No regular exercise.  - + CKD, last BUN/creatinine:  Lab Results  Component Value Date   BUN 42* 05/22/2015   CREATININE 2.44* 05/22/2015  On Losartan. - last set of lipids: Lab Results  Component Value Date   CHOL  252* 05/22/2015   HDL 58.70 05/22/2015   LDLCALC 154* 05/22/2015   LDLDIRECT 144.5 06/18/2011   TRIG 196.0* 05/22/2015   CHOLHDL 4 05/22/2015  On Lipitor 10. - last eye exam was in 05/2015. No DR, reportedly >> will try to obtain the report.  - + numbness and tingling in his feet. Not on meds.  He also has a history of hypertension and hyperlipidemia.  ROS: Constitutional: + weight loss, no fatigue, + subjective hypothermia, + nocturia  Eyes: + blurry vision, no xerophthalmia ENT: no sore throat, no nodules palpated in throat, no dysphagia/odynophagia, no hoarseness Cardiovascular: no CP/SOB/palpitations/leg swelling Respiratory: no cough/no SOB Gastrointestinal: no N/V/D/C Musculoskeletal: no muscle/joint aches Skin: no rashes Neurological: no tremors/numbness/tingling/dizziness  I reviewed pt's medications, allergies, PMH, social hx, family hx, and changes were documented in the history of present illness. Otherwise, unchanged from my initial visit note.  Past Medical History  Diagnosis Date  . Hypertension   . ED (erectile dysfunction)   . Peripheral neuropathy ([facility])   . Diabetes mellitus     type I and type II  . Chronic kidney disease     "starting to bother me now" (07/28/2012)   Past Surgical History  Procedure Laterality Date  . Appendectomy  1950's   Social History  Social History  . Marital Status: Married    Spouse Name: N/A   Occupational History  . Night custodian     [employer]   Social History Main Topics  . Smoking status: Former Smoker -- 0.12 packs/day for 10 years, quit in 1980     Types: Cigarettes  . Smokeless tobacco: Never Used     Comment: 07/28/2012 "quit smoking cigarettes 20-30 yr ago"  . Alcohol Use: 2.4 oz/week    4 Cans of beer per week     Comment: daily to less than one a month  . Drug Use: No   Current Outpatient Prescriptions on File Prior to Visit  Medication Sig Dispense Refill  . amLODipine (NORVASC) 10 MG tablet  Take 1 tablet (10 mg total) by mouth daily. 90 tablet 3  . aspirin 81 MG tablet Take 1 tablet (81 mg total) by mouth daily. 30 tablet   . atorvastatin (LIPITOR) 10 MG tablet Take 1 tablet (10 mg total) by mouth daily. 90 tablet 3  . hydrochlorothiazide (HYDRODIURIL) 25 MG tablet Take 1 tablet (25 mg total) by mouth daily. 90 tablet 3  . Insulin Glargine (LANTUS SOLOSTAR) 100 UNIT/ML Solostar Pen 20 units daily at bedtime 2 pen PRN  . Insulin Pen Needle (CAREFINE PEN NEEDLES) 32G X 4 MM MISC Use 1x a day 100 each 11  . losartan (COZAAR) 100 MG tablet Take 1 tablet (100 mg total) by mouth daily. 90 tablet 3  . metFORMIN (GLUCOPHAGE) 1000 MG tablet Take 1  tablet in the morning and 1 tablet with dinner 200 tablet 3  . metoprolol tartrate (LOPRESSOR) 25 MG tablet Take 1 tablet (25 mg total) by mouth 2 (two) times daily. 180 tablet 3  . sitaGLIPtin (JANUVIA) 50 MG tablet Take 1 tablet (50 mg total) by mouth daily. 30 tablet 2   No current facility-administered medications on file prior to visit.   No Known Allergies Family History  Problem Relation Age of Onset  . Colon polyps Mother   . Diabetes Father   . Diabetes Cousin    PE: BP 144/84 mmHg  Pulse 66  Temp(Src) 97.7 F (36.5 C) (Oral)  Ht 5\' 9"  (1.753 m)  Wt 167 lb 4 oz (75.864 kg)  BMI 24.69 kg/m2  SpO2 99% Wt Readings from Last 3 Encounters:  06/21/15 167 lb 4 oz (75.864 kg)  06/11/15 173 lb (78.472 kg)  06/03/15 174 lb (78.926 kg)   Constitutional: Normal weight, in NAD Eyes: PERRLA, EOMI, no exophthalmos ENT: moist mucous membranes, no thyromegaly, no cervical lymphadenopathy Cardiovascular: RRR, No MRG, + mild bilateral pitting edema Respiratory: CTA B Gastrointestinal: abdomen soft, NT, ND, BS+ Musculoskeletal: no deformities, strength intact in all 4 Skin: moist, warm Neurological: no tremor with outstretched hands, DTR normal in all 4  ASSESSMENT: 1. DM2, insulin-dependent, uncontrolled, with complications -  CKD - PN  PLAN:  1. Patient with long-standing, uncontrolled diabetes, on oral antidiabetic regimen with Metformin and also basal insulin. His diabetes control improved dramatically since the beginning of last year, but now worse as he is not taking his meds as advised.  He missed multiple doses over the Holidays. - He restarted to take Lantus consistently and increased the dose as advised by Dr Sherren Mocha to 20 units. Will continue this dose, but switch to Zachary which will probably be cheaper for him.As he only checks sugars in am ad these are at goal (mostly) in the last 2 weeks, I cannot make a lot  of other changes in his regimen. Strongly advised him to start checking later in the day also. We'll hold off Januvia for now. We also have to decrease metformin to half maximal dose due to his kidney function which was much lower than the one obtained 5 months ago. We will need to recheck this at next visit. - I suggested to:  Patient Instructions  Please stop Lantus and start Basaglar 20 units at bedtime.  Decrease Metformin to 500 mg 2x a day with meals.  Hold off Januvia for now.  Please return in 1.5 months with your sugar log.   - Strongly advised him to start checking sugars at different times of the day - check 2 times a day, rotating checks - advised for yearly eye exams >> he is up-to-date Return in about 6 weeks (around 08/02/2015).

## 2015-06-21 NOTE — Patient Instructions (Signed)
Please stop Lantus and start Basaglar 20 units at bedtime.  Decrease Metformin to 500 mg 2x a day with meals.  Hold off Januvia for now.  Please return in 1.5 months with your sugar log.

## 2015-06-21 NOTE — Progress Notes (Signed)
Pre visit review using our clinic review tool, if applicable. No additional management support is needed unless otherwise documented below in the visit note. 

## 2015-07-08 ENCOUNTER — Encounter: Payer: Self-pay | Admitting: Family Medicine

## 2015-07-15 ENCOUNTER — Encounter: Payer: Self-pay | Admitting: Family Medicine

## 2015-07-15 ENCOUNTER — Ambulatory Visit (INDEPENDENT_AMBULATORY_CARE_PROVIDER_SITE_OTHER): Payer: BC Managed Care – PPO | Admitting: Family Medicine

## 2015-07-15 VITALS — Temp 98.1°F | Wt 171.0 lb

## 2015-07-15 DIAGNOSIS — I10 Essential (primary) hypertension: Secondary | ICD-10-CM

## 2015-07-15 MED ORDER — METOPROLOL TARTRATE 50 MG PO TABS: ORAL_TABLET | ORAL | Status: DC

## 2015-07-15 NOTE — Progress Notes (Signed)
   Subjective:    Patient ID: [name] [name], male    DOB: Jun 06, 1950, 65 y.o.   MRN: [id number]  HPI [name] [name] is a 65 year old male who comes in today for follow-up of hypertension  He had not been taking his blood pressure on a regular basis and his blood pressure rose dramatically a couple weeks ago. As noticed in that office visit we kept him here given clonidine and beta blocker until his blood pressure started to drop. We then kept him at home at bedrest for 48 hours. His current medications are Norvasc 10 mg in the morning, hydrochlorothiazide 25 mg in the morning, Cozaar 100 mg morning and Lopressor 25 mg twice a day. Blood pressure today 180/90 BP at home running [id number] systolic diastolics in the [id number] range   Review of Systems Review of systems negative    Objective:   Physical Exam  Well-developed well-nourished male no acute distress vital signs stable he is afebrile resting heart rate 80      Assessment & Plan:  Hypertension not at goal........... increase beta blocker as outlined follow-up in one month

## 2015-07-15 NOTE — Progress Notes (Signed)
Pre visit review using our clinic review tool, if applicable. No additional management support is needed unless otherwise documented below in the visit note. 

## 2015-07-15 NOTE — Patient Instructions (Signed)
Lopressor 50 mg.......... one tablet daily in the morning  Lopressor 25 mg....... one tablet daily at bedtime  Continue the Norvasc 10 mg, hydrochlorothiazide 25 mg, Cozaar 100 mg......Marland Kitchen on the morning  Check your blood pressure in the morning follow-up in one month

## 2015-07-23 ENCOUNTER — Telehealth: Payer: Self-pay | Admitting: *Deleted

## 2015-07-23 MED ORDER — GLUCOSE BLOOD VI STRP
ORAL_STRIP | Status: DC
Start: 2015-07-23 — End: 2015-08-14

## 2015-07-23 MED ORDER — ONETOUCH ULTRASOFT LANCETS MISC: Status: DC

## 2015-07-23 NOTE — Telephone Encounter (Signed)
DM supplies sent to [facility]

## 2015-08-12 ENCOUNTER — Ambulatory Visit: Payer: BC Managed Care – PPO | Admitting: Internal Medicine

## 2015-08-14 ENCOUNTER — Encounter: Payer: Self-pay | Admitting: Family Medicine

## 2015-08-14 ENCOUNTER — Ambulatory Visit (INDEPENDENT_AMBULATORY_CARE_PROVIDER_SITE_OTHER): Payer: BC Managed Care – PPO | Admitting: Family Medicine

## 2015-08-14 VITALS — BP 180/90 | Temp 98.5°F | Wt 169.0 lb

## 2015-08-14 DIAGNOSIS — I10 Essential (primary) hypertension: Secondary | ICD-10-CM | POA: Diagnosis not present

## 2015-08-14 MED ORDER — GLUCOSE BLOOD VI STRP: ORAL_STRIP | Status: DC

## 2015-08-14 MED ORDER — METOPROLOL TARTRATE 100 MG PO TABS: ORAL_TABLET | ORAL | Status: DC

## 2015-08-14 NOTE — Patient Instructions (Signed)
Lopressor 100 mg......... one tablet daily in the morning  Norvasc 10 mg,,,, one tablet daily in the morning  Hydrocort thiazide 25 mg,,,,,,,, one tablet daily in the morning  Cozaar 100 mg,,,,,,,,,, one tablet daily in the morning  Take all 4 pills at the same time every morning  Check your blood pressure daily in the morning  Return in 4 weeks for follow-up

## 2015-08-14 NOTE — Progress Notes (Signed)
   Subjective:    Patient ID: [name] [name], male    DOB: June 17, 1950, 65 y.o.   MRN: [id number]  HPI Pastor is a 65 year old male nonsmoker who comes in today for follow-up of hypertension  We been working with him over the last couple months to get his blood pressure goal. When he takes his medication on a daily basis his blood pressure drops to normal. However he can't remember to take his medicine on a regular basis. Sometimes he says he'll skip the 50 mg of Lopressor in the morning sometimes it'll says he skips to 25 mg in the evening. We've discussed with him the past the negative physical impact of hypertension including heart attack stroke kidney failure. He understands the negative consequences of untreated hypertension but still finds it difficult to take his medication on a daily basis    Review of Systems Review of systems otherwise negative    Objective:   Physical Exam  Well-developed well-nourished male no acute distress vital signs stable he is afebrile BP right arm sitting position 180/90 pulse 70 and regular      Assessment & Plan:  Hypertension not at goal............ continue Norvasc Hydrocort thiazide and Cozaar.......... increase the Lopressor but take it once a day in the morning to see if we can help improve his compliance

## 2015-08-20 ENCOUNTER — Telehealth: Payer: Self-pay | Admitting: Family Medicine

## 2015-08-20 NOTE — Telephone Encounter (Signed)
Patient Name: [name] [name] DOB: 15-Aug-1950 Initial Comment Caller states he has two different glucose meters, OneTouch and Verio, results are very different, wants to know how to know which is more accurate Nurse Assessment Nurse: Vallery Sa, RN, [place] Date/Time (Eastern Time): 08/20/2015 12:52:49 PM Confirm and document reason for call. If symptomatic, describe symptoms. You must click the next button to save text entered. ---Caller has a OneTouch Ultra and a newer Verio glucose monitor that give different measurements. He wants to know which one to use. His blood sugar was 85 before eating today. No fever. No vomiting or diarrhea. Alert and responsive. Has the patient traveled out of the country within the last 30 days? ---No Does the patient have any new or worsening symptoms? ---Yes Will a triage be completed? ---Yes Related visit to physician within the last 2 weeks? ---Yes Does the PT have any chronic conditions? (i.e. diabetes, asthma, etc.) ---Yes List chronic conditions. ---Diabetes, High Blood Pressure Is this a behavioral health or substance abuse call? ---No Guidelines Guideline Title Affirmed Question Affirmed Notes Diabetes - Low Blood Sugar Low blood sugar definition and treatment, questions about Final Disposition User [place], RN, Show Low will use his newer Verio glucometer. He will bring his monitors with him to his next appointment to compare. Encouraged to call with any concerns or questions. Disagree/Comply: Comply

## 2015-08-21 NOTE — Telephone Encounter (Signed)
noted 

## 2015-09-24 ENCOUNTER — Encounter: Payer: Self-pay | Admitting: Family Medicine

## 2015-09-24 ENCOUNTER — Ambulatory Visit (INDEPENDENT_AMBULATORY_CARE_PROVIDER_SITE_OTHER): Payer: BC Managed Care – PPO | Admitting: Family Medicine

## 2015-09-24 VITALS — Temp 98.0°F | Wt 168.0 lb

## 2015-09-24 DIAGNOSIS — I10 Essential (primary) hypertension: Secondary | ICD-10-CM | POA: Diagnosis not present

## 2015-09-24 DIAGNOSIS — Z794 Long term (current) use of insulin: Secondary | ICD-10-CM | POA: Diagnosis not present

## 2015-09-24 DIAGNOSIS — E1121 Type 2 diabetes mellitus with diabetic nephropathy: Secondary | ICD-10-CM

## 2015-09-24 DIAGNOSIS — E1165 Type 2 diabetes mellitus with hyperglycemia: Secondary | ICD-10-CM

## 2015-09-24 DIAGNOSIS — IMO0002 Reserved for concepts with insufficient information to code with codable children: Secondary | ICD-10-CM

## 2015-09-24 LAB — BASIC METABOLIC PANEL
BUN: 40 mg/dL — ABNORMAL HIGH (ref 6–23)
CO2: 27 mEq/L (ref 19–32)
Calcium: 9.2 mg/dL (ref 8.4–10.5)
Chloride: 107 mEq/L (ref 96–112)
Creatinine, Ser: 2.42 mg/dL — ABNORMAL HIGH (ref 0.40–1.50)
GFR: 34.6 mL/min — AB (ref >60.00)
Glucose, Bld: 171 mg/dL — ABNORMAL HIGH (ref 70–99)
POTASSIUM: 5.1 meq/L (ref 3.5–5.1)
SODIUM: 141 meq/L (ref 135–145)

## 2015-09-24 LAB — MICROALBUMIN / CREATININE URINE RATIO
Creatinine,U: 98.5 mg/dL
Microalb Creat Ratio: 247.5 mg/g — ABNORMAL HIGH (ref 0.0–30.0)
Microalb, Ur: 243.9 mg/dL — ABNORMAL HIGH (ref 0.0–1.9)

## 2015-09-24 LAB — HEMOGLOBIN A1C: Hgb A1c MFr Bld: 6.9 % — ABNORMAL HIGH (ref 4.6–6.5)

## 2015-09-24 NOTE — Progress Notes (Signed)
   Subjective:    Patient ID: ROBERTT BUDA, male    DOB: Aug 26, 1950, 65 y.o.   MRN: [id number]  HPI Turhan is a 65 year old male nonsmoker who comes in today for evaluation of hypertension diabetes  We have referred him to endocrinology because his blood sugar was out of control. He's currently taking Lantus 20 units at bedtime along with Januvia 50 mg daily and metformin 1000 mg daily. He's followed by endocrinology. Last A1c was 8.63 months ago. He's due for follow-up A1c  He also had an issue with noncompliance with medication and developed acute renal insufficiency had to be hospitalized. Since that time his blood pressures been difficult to control. He's on Norvasc 10, hydrochlorothiazide 25, Cozaar 100, and Lopressor 100 mg. BP is 160/90 right arm sitting position last be met shows significant deterioration in his renal function however this was just after he was hospitalized. I think it would be prudent to recheck his renal function and get him set up to see nephrology because of her inability to control his blood pressure with the above medications.   Review of Systems Review of systems otherwise negative    Objective:   Physical Exam   Well-developed thin male no acute distress vital signs stable except for BP 160/90 right arm sitting position     Assessment & Plan:  Hypertension not at goal............Marland Kitchen renal insufficiency......... nephrology consult.........Marland Kitchen recheck labs........ continue current medications until we see what the nephrologist says  Diabetes type 1,,,,,,,, recheck labs.

## 2015-09-24 NOTE — Progress Notes (Signed)
Pre visit review using our clinic review tool, if applicable. No additional management support is needed unless otherwise documented below in the visit note. 

## 2015-09-24 NOTE — Patient Instructions (Signed)
Labs today  Continue current medications  We will get you set up a consult with nephrologist to help [name] determine what medicines we can use to lower your blood pressure to normal

## 2015-10-24 ENCOUNTER — Other Ambulatory Visit: Payer: Self-pay | Admitting: Nephrology

## 2015-10-24 DIAGNOSIS — N183 Chronic kidney disease, stage 3 (moderate): Secondary | ICD-10-CM

## 2015-11-12 ENCOUNTER — Other Ambulatory Visit: Payer: BC Managed Care – PPO

## 2015-11-15 ENCOUNTER — Ambulatory Visit
Admission: RE | Admit: 2015-11-15 | Discharge: 2015-11-15 | Disposition: A | Discharge status: Home / Self Care | Payer: BC Managed Care – PPO | Source: Ambulatory Visit | Attending: Nephrology | Admitting: Nephrology

## 2015-11-15 DIAGNOSIS — N183 Chronic kidney disease, stage 3 (moderate): Secondary | ICD-10-CM

## 2015-11-16 ENCOUNTER — Other Ambulatory Visit: Payer: Self-pay | Admitting: Family Medicine

## 2016-01-14 ENCOUNTER — Other Ambulatory Visit: Payer: Self-pay | Admitting: Adult Health

## 2016-01-14 DIAGNOSIS — I10 Essential (primary) hypertension: Secondary | ICD-10-CM

## 2016-03-18 ENCOUNTER — Other Ambulatory Visit: Payer: Self-pay

## 2016-03-18 DIAGNOSIS — Z01810 Encounter for preprocedural cardiovascular examination: Secondary | ICD-10-CM

## 2016-03-18 DIAGNOSIS — N185 Chronic kidney disease, stage 5: Secondary | ICD-10-CM

## 2016-04-22 ENCOUNTER — Encounter: Payer: Self-pay | Admitting: Surgery

## 2016-04-27 ENCOUNTER — Ambulatory Visit (HOSPITAL_COMMUNITY)
Admission: RE | Admit: 2016-04-27 | Discharge: 2016-04-27 | Disposition: A | Discharge status: Home / Self Care | Payer: BC Managed Care – PPO | Source: Ambulatory Visit | Attending: Surgery | Admitting: Surgery

## 2016-04-27 ENCOUNTER — Ambulatory Visit (INDEPENDENT_AMBULATORY_CARE_PROVIDER_SITE_OTHER)
Admission: RE | Admit: 2016-04-27 | Discharge: 2016-04-27 | Disposition: A | Discharge status: Home / Self Care | Payer: BC Managed Care – PPO | Source: Ambulatory Visit | Attending: Surgery | Admitting: Surgery

## 2016-04-27 ENCOUNTER — Ambulatory Visit (INDEPENDENT_AMBULATORY_CARE_PROVIDER_SITE_OTHER): Payer: BC Managed Care – PPO | Admitting: Surgery

## 2016-04-27 ENCOUNTER — Encounter: Payer: Self-pay | Admitting: Surgery

## 2016-04-27 VITALS — BP 204/96 | HR 80 | Temp 98.6°F | Resp 20 | Ht 70.0 in | Wt 173.3 lb

## 2016-04-27 DIAGNOSIS — N185 Chronic kidney disease, stage 5: Secondary | ICD-10-CM | POA: Diagnosis not present

## 2016-04-27 DIAGNOSIS — Z01810 Encounter for preprocedural cardiovascular examination: Secondary | ICD-10-CM | POA: Diagnosis not present

## 2016-04-27 DIAGNOSIS — N184 Chronic kidney disease, stage 4 (severe): Secondary | ICD-10-CM

## 2016-04-27 NOTE — Progress Notes (Signed)
Vitals:   04/27/16 1211 04/27/16 1225  BP: (!) 208/94 (!) 204/96  Pulse: 80   Resp: 20   Temp: 98.6 F (37 C)   TempSrc: Oral   SpO2: 100%   Weight: 173 lb 4.8 oz (78.6 kg)   Height: 5\' 10"  (1.778 m)

## 2016-04-27 NOTE — Progress Notes (Signed)
[facility]  Patient name: [name] [name] MRN: [id number] DOB: August 06, 1950 Sex: male  REFERRING PHYSICIAN: Dr. Florene Glen  REASON FOR CONSULT: HD Access  HPI: [name] [name] is a 65 y.o. male, who is referred today for dialysis access.  He has stage IV renal insufficiency and is not yet on dialysis.  He is right-handed.  His renal failure is secondary to diabetes and hypertension.  He does suffer from lower extremity edema.  He is on an ARB for hypertension.  He suffers from diabetes which is not well controlled.  He was in the hospital and 2016 with a blood sugar of 475.  His hypercholesterolemia is managed with a statin.  Past Medical History:  Diagnosis Date  . Chronic kidney disease    "starting to bother me now" (07/28/2012)  . Diabetes mellitus    type I and type II  . ED (erectile dysfunction)   . Hypertension   . Peripheral neuropathy ([name])     Family History  Problem Relation Age of Onset  . Colon polyps Mother   . Diabetes Father   . Diabetes Cousin     SOCIAL HISTORY: Social History   Social History  . Marital status: Married    Spouse name: N/A  . Number of children: N/A  . Years of education: N/A   Occupational History  .  [employer]   Social History Main Topics  . Smoking status: Former Smoker    Packs/day: 0.12    Years: 10.00    Types: Cigarettes    Quit date: 04/27/1986  . Smokeless tobacco: Never Used     Comment: 07/28/2012 "quit smoking cigarettes 20-30 yr ago"  . Alcohol use 2.4 oz/week    4 Cans of beer per week     Comment: daily to less than one a month  . Drug use: No  . Sexual activity: Not Currently   Other Topics Concern  . Not on file   Social History Narrative  . No narrative on file    No Known Allergies  Current Outpatient Prescriptions  Medication Sig Dispense Refill  . amLODipine (NORVASC) 10 MG tablet Take 1 tablet (10  mg total) by mouth daily. 90 tablet 3  . aspirin 81 MG tablet Take 1 tablet (81 mg total) by mouth daily. 30 tablet   . atorvastatin (LIPITOR) 10 MG tablet Take 1 tablet (10 mg total) by mouth daily. 90 tablet 3  . glucose blood (ONE TOUCH TEST STRIPS) test strip Test twice daily, dx E10.9, one touch verio flex 200 each 12  . hydrochlorothiazide (HYDRODIURIL) 25 MG tablet TAKE 1 TABLET BY MOUTH EVERY DAY 90 tablet 2  . Insulin Glargine (BASAGLAR KWIKPEN) 100 UNIT/ML Solostar Pen Inject 20 Units into the skin daily at 10 pm. 15 mL 2  . Insulin Pen Needle (CAREFINE PEN NEEDLES) 32G X 4 MM MISC Use 1x a day 100 each 11  . Lancets (ONETOUCH ULTRASOFT) lancets Test twice daily Dx E10.9 OneTouch Verio flex 200 each 12  . losartan (COZAAR) 100 MG tablet Take 1 tablet (100 mg total) by mouth daily. 90 tablet 3  . metFORMIN (GLUCOPHAGE) 1000 MG tablet Take 1  tablet in the morning and 1 tablet with dinner 200 tablet 3  . metFORMIN (GLUCOPHAGE) 1000 MG tablet ONE HALF TAB TWICE DAILY 200 tablet 2  . metoprolol (LOPRESSOR) 100 MG tablet 1 tablet daily in the morning 100 tablet  3  . sitaGLIPtin (JANUVIA) 50 MG tablet Take 1 tablet (50 mg total) by mouth daily. 30 tablet 2   No current facility-administered medications for this visit.     REVIEW OF SYSTEMS:  [X]  denotes positive finding, [ ]  denotes negative finding Cardiac  Comments:  Chest pain or chest pressure:    Shortness of breath upon exertion:    Short of breath when lying flat:    Irregular heart rhythm:        Vascular    Pain in calf, thigh, or hip brought on by ambulation:    Pain in feet at night that wakes you up from your sleep:     Blood clot in your veins:    Leg swelling:  x       Pulmonary    Oxygen at home:    Productive cough:     Wheezing:         Neurologic    Sudden weakness in arms or legs:     Sudden numbness in arms or legs:     Sudden onset of difficulty speaking or slurred speech:    Temporary loss of vision  in one eye:     Problems with dizziness:         Gastrointestinal    Blood in stool:     Vomited blood:         Genitourinary    Burning when urinating:     Blood in urine:        Psychiatric    Major depression:         Hematologic    Bleeding problems:    Problems with blood clotting too easily:        Skin    Rashes or ulcers:        Constitutional    Fever or chills:      PHYSICAL EXAM: Vitals:   04/27/16 1211  BP: (!) 208/94  Pulse: 80  Resp: 20  Temp: 98.6 F (37 C)  TempSrc: Oral  SpO2: 100%  Weight: 173 lb 4.8 oz (78.6 kg)  Height: 5\' 10"  (1.778 m)    GENERAL: The patient is a well-nourished male, in no acute distress. The vital signs are documented above. CARDIAC: There is a regular rate and rhythm.  VASCULAR: Palpable right radial pulse PULMONARY: Nonlabored respirations MUSCULOSKELETAL: There are no major deformities or cyanosis. NEUROLOGIC: No focal weakness or paresthesias are detected. SKIN: There are no ulcers or rashes noted. PSYCHIATRIC: The patient has a normal affect.  DATA:  I have reviewed his vascular lab studies: He has a high takeoff of the radial artery.  Waveforms are triphasic  Vein mapping shows a saphenous vein in the right arm that appears to be adequate for fistula creation.  This left cephalic vein appears occluded.  The right basilic vein was not visualized the left basilic vein was not visualized  ASSESSMENT AND PLAN: I discussed proceeding with a right brachiocephalic fistula.  This is been scheduled for Thursday, December 28.  I discussed the risks and benefits of the operation including the risk of steal, the risk of non-maturity, and the need for future interventions.  All of his questions were answered.   Annamarie Major, MD [facility] [phone] Pager [phone]

## 2016-04-28 ENCOUNTER — Other Ambulatory Visit: Payer: Self-pay

## 2016-05-13 ENCOUNTER — Encounter (HOSPITAL_COMMUNITY): Payer: Self-pay | Admitting: *Deleted

## 2016-05-13 NOTE — Progress Notes (Signed)
Spoke with pt for pre-op call. Pt denies cardiac history, chest pain or sob. Pt is diabetic. Last A1C was 6.9 in May, 2017. Pt states he does not check his blood sugar at home. Instructed pt to take 1/2 of his regular dose of Basaglar Insulin tonight and no Metformin in the AM. Instructed pt to check blood sugar in the AM. If blood sugar is 70 or below, treat with 1/2 cup of clear juice (apple or cranberry) and recheck blood sugar 15 minutes after drinking juice. Pt voiced understanding.

## 2016-05-14 ENCOUNTER — Encounter (HOSPITAL_COMMUNITY)
Admission: RE | Disposition: A | Discharge status: Home / Self Care | Payer: Self-pay | Source: Ambulatory Visit | Attending: Surgery

## 2016-05-14 ENCOUNTER — Ambulatory Visit (HOSPITAL_COMMUNITY)
Admission: RE | Admit: 2016-05-14 | Discharge: 2016-05-14 | Disposition: A | Discharge status: Home / Self Care | Payer: BC Managed Care – PPO | Source: Ambulatory Visit | Attending: Surgery | Admitting: Surgery

## 2016-05-14 ENCOUNTER — Other Ambulatory Visit: Payer: Self-pay

## 2016-05-14 ENCOUNTER — Ambulatory Visit (HOSPITAL_COMMUNITY): Payer: BC Managed Care – PPO | Admitting: Certified Registered"

## 2016-05-14 ENCOUNTER — Encounter (HOSPITAL_COMMUNITY): Payer: Self-pay | Admitting: *Deleted

## 2016-05-14 DIAGNOSIS — N529 Male erectile dysfunction, unspecified: Secondary | ICD-10-CM | POA: Insufficient documentation

## 2016-05-14 DIAGNOSIS — E1122 Type 2 diabetes mellitus with diabetic chronic kidney disease: Secondary | ICD-10-CM | POA: Diagnosis not present

## 2016-05-14 DIAGNOSIS — I129 Hypertensive chronic kidney disease with stage 1 through stage 4 chronic kidney disease, or unspecified chronic kidney disease: Secondary | ICD-10-CM | POA: Insufficient documentation

## 2016-05-14 DIAGNOSIS — Z79899 Other long term (current) drug therapy: Secondary | ICD-10-CM | POA: Insufficient documentation

## 2016-05-14 DIAGNOSIS — Z48812 Encounter for surgical aftercare following surgery on the circulatory system: Secondary | ICD-10-CM

## 2016-05-14 DIAGNOSIS — N185 Chronic kidney disease, stage 5: Secondary | ICD-10-CM | POA: Diagnosis not present

## 2016-05-14 DIAGNOSIS — Z794 Long term (current) use of insulin: Secondary | ICD-10-CM | POA: Diagnosis not present

## 2016-05-14 DIAGNOSIS — N184 Chronic kidney disease, stage 4 (severe): Secondary | ICD-10-CM

## 2016-05-14 DIAGNOSIS — G629 Polyneuropathy, unspecified: Secondary | ICD-10-CM | POA: Diagnosis not present

## 2016-05-14 DIAGNOSIS — Z87891 Personal history of nicotine dependence: Secondary | ICD-10-CM | POA: Insufficient documentation

## 2016-05-14 DIAGNOSIS — E7800 Pure hypercholesterolemia, unspecified: Secondary | ICD-10-CM | POA: Diagnosis not present

## 2016-05-14 DIAGNOSIS — Z7982 Long term (current) use of aspirin: Secondary | ICD-10-CM | POA: Insufficient documentation

## 2016-05-14 HISTORY — DX: Polyneuropathy, unspecified: G62.9

## 2016-05-14 HISTORY — DX: Unspecified osteoarthritis, unspecified site: M19.90

## 2016-05-14 HISTORY — DX: Anemia, unspecified: D64.9

## 2016-05-14 HISTORY — DX: Functional diarrhea: K59.1

## 2016-05-14 HISTORY — PX: AV FISTULA PLACEMENT: SHX1204

## 2016-05-14 LAB — POCT I-STAT 4, (NA,K, GLUC, HGB,HCT)
GLUCOSE: 103 mg/dL — AB (ref 65–99)
HCT: 25 % — ABNORMAL LOW (ref 39.0–52.0)
HEMOGLOBIN: 8.5 g/dL — AB (ref 13.0–17.0)
POTASSIUM: 4.6 mmol/L (ref 3.5–5.1)
SODIUM: 144 mmol/L (ref 135–145)

## 2016-05-14 LAB — GLUCOSE, CAPILLARY
GLUCOSE-CAPILLARY: 100 mg/dL — AB (ref 65–99)
Glucose-Capillary: 93 mg/dL (ref 65–99)
Glucose-Capillary: 92 mg/dL (ref 65–99)

## 2016-05-14 SURGERY — ARTERIOVENOUS (AV) FISTULA CREATION
Anesthesia: Monitor Anesthesia Care | Site: Arm Lower | Laterality: Right

## 2016-05-14 MED ORDER — ONDANSETRON HCL 4 MG/2ML IJ SOLN: 4.0000 mg | Freq: Once | INTRAMUSCULAR | Status: DC | PRN

## 2016-05-14 MED ORDER — MIDAZOLAM HCL 2 MG/2ML IJ SOLN
INTRAMUSCULAR | Status: AC
  Filled 2016-05-14: qty 2

## 2016-05-14 MED ORDER — ONDANSETRON HCL 4 MG/2ML IJ SOLN
INTRAMUSCULAR | Status: AC
Start: 2016-05-14 — End: 2016-05-14
  Filled 2016-05-14: qty 2

## 2016-05-14 MED ORDER — HYDROMORPHONE HCL 1 MG/ML IJ SOLN: 0.2500 mg | INTRAMUSCULAR | Status: DC | PRN

## 2016-05-14 MED ORDER — PROPOFOL 10 MG/ML IV BOLUS
INTRAVENOUS | Status: AC
  Filled 2016-05-14: qty 20

## 2016-05-14 MED ORDER — CHLORHEXIDINE GLUCONATE CLOTH 2 % EX PADS: 6.0000 | MEDICATED_PAD | Freq: Once | CUTANEOUS | Status: DC

## 2016-05-14 MED ORDER — OXYCODONE-ACETAMINOPHEN 5-325 MG PO TABS: 1.0000 | ORAL_TABLET | Freq: Four times a day (QID) | ORAL | 0 refills | Status: DC | PRN

## 2016-05-14 MED ORDER — DEXTROSE 5 % IV SOLN
1.5000 g | INTRAVENOUS | Status: AC
  Administered 2016-05-14: 1.5 g via INTRAVENOUS

## 2016-05-14 MED ORDER — 0.9 % SODIUM CHLORIDE (POUR BTL) OPTIME
TOPICAL | Status: DC | PRN
  Administered 2016-05-14: 1000 mL

## 2016-05-14 MED ORDER — DEXTROSE 5 % IV SOLN
INTRAVENOUS | Status: AC
  Filled 2016-05-14: qty 1.5

## 2016-05-14 MED ORDER — SODIUM CHLORIDE 0.9 % IV SOLN
INTRAVENOUS | Status: DC | PRN
  Administered 2016-05-14: 500 mL

## 2016-05-14 MED ORDER — MEPERIDINE HCL 25 MG/ML IJ SOLN: 6.2500 mg | INTRAMUSCULAR | Status: DC | PRN

## 2016-05-14 MED ORDER — SODIUM CHLORIDE 0.9 % IV SOLN
INTRAVENOUS | Status: DC
  Administered 2016-05-14 (×2): via INTRAVENOUS

## 2016-05-14 MED ORDER — LIDOCAINE HCL (PF) 1 % IJ SOLN
INTRAMUSCULAR | Status: DC | PRN
  Administered 2016-05-14: 6 mL

## 2016-05-14 MED ORDER — PROPOFOL 500 MG/50ML IV EMUL
INTRAVENOUS | Status: DC | PRN
  Administered 2016-05-14: 75 ug/kg/min via INTRAVENOUS

## 2016-05-14 MED ORDER — PROTAMINE SULFATE 10 MG/ML IV SOLN
INTRAVENOUS | Status: AC
  Filled 2016-05-14: qty 5

## 2016-05-14 MED ORDER — FENTANYL CITRATE (PF) 100 MCG/2ML IJ SOLN
INTRAMUSCULAR | Status: AC
  Filled 2016-05-14: qty 2

## 2016-05-14 MED ORDER — LIDOCAINE HCL (PF) 1 % IJ SOLN
INTRAMUSCULAR | Status: AC
  Filled 2016-05-14: qty 30

## 2016-05-14 MED ORDER — HEPARIN SODIUM (PORCINE) 1000 UNIT/ML IJ SOLN
INTRAMUSCULAR | Status: DC | PRN
  Administered 2016-05-14: 3000 [IU] via INTRAVENOUS

## 2016-05-14 MED ORDER — MIDAZOLAM HCL 2 MG/2ML IJ SOLN
INTRAMUSCULAR | Status: DC | PRN
  Administered 2016-05-14: 2 mg via INTRAVENOUS

## 2016-05-14 MED ORDER — LIDOCAINE 2% (20 MG/ML) 5 ML SYRINGE
INTRAMUSCULAR | Status: AC
Start: 2016-05-14 — End: 2016-05-14
  Filled 2016-05-14: qty 5

## 2016-05-14 MED ORDER — METOPROLOL TARTRATE 100 MG PO TABS
100.0000 mg | ORAL_TABLET | Freq: Once | ORAL | Status: AC
Start: 2016-05-14 — End: 2016-05-14
  Administered 2016-05-14: 100 mg via ORAL
  Filled 2016-05-14: qty 1

## 2016-05-14 MED ORDER — METOPROLOL TARTRATE 50 MG PO TABS
ORAL_TABLET | ORAL | Status: AC
  Filled 2016-05-14: qty 2

## 2016-05-14 MED ORDER — HEPARIN SODIUM (PORCINE) 1000 UNIT/ML IJ SOLN
INTRAMUSCULAR | Status: AC
  Filled 2016-05-14: qty 1

## 2016-05-14 MED ORDER — PROTAMINE SULFATE 10 MG/ML IV SOLN
INTRAVENOUS | Status: DC | PRN
  Administered 2016-05-14: 25 mg via INTRAVENOUS

## 2016-05-14 MED ORDER — FENTANYL CITRATE (PF) 100 MCG/2ML IJ SOLN
INTRAMUSCULAR | Status: DC | PRN
Start: 2016-05-14 — End: 2016-05-14
  Administered 2016-05-14: 50 ug via INTRAVENOUS

## 2016-05-14 SURGICAL SUPPLY — 38 items
ADH SKN CLS APL DERMABOND .7 (GAUZE/BANDAGES/DRESSINGS) ×1
ARMBAND PINK RESTRICT EXTREMIT (MISCELLANEOUS) ×6 IMPLANT
CANISTER SUCTION 2500CC (MISCELLANEOUS) ×3 IMPLANT
CLIP TI MEDIUM 6 (CLIP) ×3 IMPLANT
CLIP TI WIDE RED SMALL 6 (CLIP) ×3 IMPLANT
COVER PROBE W GEL 5X96 (DRAPES) ×3 IMPLANT
DERMABOND ADVANCED (GAUZE/BANDAGES/DRESSINGS) ×2
DERMABOND ADVANCED .7 DNX12 (GAUZE/BANDAGES/DRESSINGS) ×1 IMPLANT
ELECT REM PT RETURN 9FT ADLT (ELECTROSURGICAL) ×3
ELECTRODE REM PT RTRN 9FT ADLT (ELECTROSURGICAL) ×1 IMPLANT
GLOVE BIO SURGEON STRL SZ 6.5 (GLOVE) ×1 IMPLANT
GLOVE BIO SURGEON STRL SZ7 (GLOVE) ×2 IMPLANT
GLOVE BIO SURGEONS STRL SZ 6.5 (GLOVE) ×1
GLOVE BIOGEL PI IND STRL 6.5 (GLOVE) IMPLANT
GLOVE BIOGEL PI IND STRL 7.0 (GLOVE) IMPLANT
GLOVE BIOGEL PI IND STRL 7.5 (GLOVE) ×1 IMPLANT
GLOVE BIOGEL PI INDICATOR 6.5 (GLOVE) ×2
GLOVE BIOGEL PI INDICATOR 7.0 (GLOVE) ×4
GLOVE BIOGEL PI INDICATOR 7.5 (GLOVE) ×2
GLOVE SURG SS PI 6.5 STRL IVOR (GLOVE) ×2 IMPLANT
GLOVE SURG SS PI 7.5 STRL IVOR (GLOVE) ×3 IMPLANT
GOWN STRL REUS W/ TWL LRG LVL3 (GOWN DISPOSABLE) ×2 IMPLANT
GOWN STRL REUS W/ TWL XL LVL3 (GOWN DISPOSABLE) ×1 IMPLANT
GOWN STRL REUS W/TWL LRG LVL3 (GOWN DISPOSABLE) ×9
GOWN STRL REUS W/TWL XL LVL3 (GOWN DISPOSABLE) ×3
HEMOSTAT SNOW SURGICEL 2X4 (HEMOSTASIS) IMPLANT
KIT BASIN OR (CUSTOM PROCEDURE TRAY) ×3 IMPLANT
KIT ROOM TURNOVER OR (KITS) ×3 IMPLANT
NS IRRIG 1000ML POUR BTL (IV SOLUTION) ×3 IMPLANT
PACK CV ACCESS (CUSTOM PROCEDURE TRAY) ×3 IMPLANT
PAD ARMBOARD 7.5X6 YLW CONV (MISCELLANEOUS) ×6 IMPLANT
SUT PROLENE 6 0 BV (SUTURE) ×2 IMPLANT
SUT PROLENE 6 0 CC (SUTURE) ×7 IMPLANT
SUT VIC AB 3-0 SH 27 (SUTURE) ×3
SUT VIC AB 3-0 SH 27X BRD (SUTURE) ×1 IMPLANT
SUT VICRYL 4-0 PS2 18IN ABS (SUTURE) ×3 IMPLANT
UNDERPAD 30X30 (UNDERPADS AND DIAPERS) ×3 IMPLANT
WATER STERILE IRR 1000ML POUR (IV SOLUTION) ×3 IMPLANT

## 2016-05-14 NOTE — Op Note (Signed)
    Patient name: [name] [name] MRN: [id number] DOB: 1951-04-18 Sex: male  05/14/2016 Pre-operative Diagnosis: Chronic renal insufficiency Post-operative diagnosis:  Same Surgeon:  Annamarie Major Assistants:  Silva Bandy Procedure:  Right radiocephalic fistula Anesthesia:  Mac Blood Loss:  See anesthesia record Specimens:  None  Findings:  Excellent-appearing cephalic vein, measuring at least 3 mm throughout the upper arm.  The artery was thickened with an internal diameter of approximately 2 mm.  Indications:  Patient comes in today for dialysis access.  He is not yet on renal replacement therapy.  He is left-handed but preoperative vein mapping showed small caliber veins in the left arm, therefore he comes in today for a right-sided fistula  Procedure:  The patient was identified in the holding area and taken to [facility] [facility]  The patient was then placed supine on the table. MAC anesthesia was administered.  The patient was prepped and draped in the usual sterile fashion.  A time out was called and antibiotics were administered.  One percent lidocaine was used for local anesthesia.  I evaluated the cephalic vein in the right arm with ultrasound and found to be of excellent caliber throughout the arm.  I then made a longitudinal incision just proximal to the wrist.  I dissected out the cephalic vein which was a 3 mm vein.  It was fully mobilized.  Side branches were ligated between silk ties.  I then dissected out the radial artery.  This appeared to be an excellent artery measuring 3 mm with an excellent pulse.  3000 units of heparin was given.  The vein was marked for orientation and ligated distally.  The vein dilated nicely with heparin saline.  I then occluded the radial artery with Serafin clamps.  A #11 blade was used to make an arteriotomy which was extended longitudinally with Potts scissors.  The patient did have a thickened intima which reduced the luminal area of the artery.  I cut the  vein to the appropriate length and performed a end to side anastomosis with running 6-0 Prolene.  Prior to completion, the appropriate flushing maneuvers were performed and the anastomosis was completed.  25 mg of protamine was given.  Once hemostasis was satisfactory the incision was closed with 2 layers of 3-0 Vicryl.  The patient had excellent Doppler signals in the radial and ulnar artery as well as a excellent signal within the cephalic vein fistula up to the level of the elbow.  Dermabond was placed on the wound.  Disposition:  To PACU in stable condition.   Theotis Burrow, M.D. [facility] [place] Office: [phone] Pager:  [phone]

## 2016-05-14 NOTE — Anesthesia Preprocedure Evaluation (Addendum)
Anesthesia Evaluation  Patient identified by MRN, date of birth, ID band Patient awake    Reviewed: Allergy & Precautions, NPO status , Patient's Chart, lab work & pertinent test results  Airway Mallampati: I  TM Distance: >3 FB Neck ROM: Full    Dental  (+) Partial Upper, Partial Lower, Missing, Poor Dentition, Chipped, Teeth Intact, Dental Advisory Given,    Pulmonary former smoker,    Pulmonary exam normal        Cardiovascular hypertension, Pt. on medications Normal cardiovascular exam     Neuro/Psych    GI/Hepatic   Endo/Other  diabetes  Renal/GU Renal InsufficiencyRenal disease     Musculoskeletal   Abdominal   Peds  Hematology   Anesthesia Other Findings   Reproductive/Obstetrics                            Anesthesia Physical Anesthesia Plan  ASA: III  Anesthesia Plan: MAC   Post-op Pain Management:    Induction: Intravenous  Airway Management Planned: Simple Face Mask  Additional Equipment:   Intra-op Plan:   Post-operative Plan:   Informed Consent: I have reviewed the patients History and Physical, chart, labs and discussed the procedure including the risks, benefits and alternatives for the proposed anesthesia with the patient or authorized representative who has indicated his/her understanding and acceptance.     Plan Discussed with: CRNA and Surgeon  Anesthesia Plan Comments:         Anesthesia Quick Evaluation

## 2016-05-14 NOTE — Transfer of Care (Signed)
Immediate Anesthesia Transfer of Care Note  Patient: [name] [name]  Procedure(s) Performed: Procedure(s): ARTERIOVENOUS (AV) FISTULA CREATION- RIGHT ARM (Right)  Patient Location: PACU  Anesthesia Type:MAC  Level of Consciousness: awake and patient cooperative  Airway & Oxygen Therapy: Patient Spontanous Breathing and Patient connected to nasal cannula oxygen  Post-op Assessment: Report given to RN  Post vital signs: Reviewed and stable  Last Vitals:  Vitals:   05/14/16 0603 05/14/16 1025  BP: (!) 194/86 108/63  Pulse:  62  Resp:  16  Temp:  36.3 C    Last Pain:  Vitals:   05/14/16 1025  TempSrc:   PainSc: (P) Asleep      Patients Stated Pain Goal: 2 (44/58/48 3507)  Complications: No apparent anesthesia complications

## 2016-05-14 NOTE — Anesthesia Postprocedure Evaluation (Signed)
Anesthesia Post Note  Patient: [name] [name]  Procedure(s) Performed: Procedure(s) (LRB): ARTERIOVENOUS (AV) FISTULA CREATION- RIGHT ARM (Right)  Patient location during evaluation: PACU Anesthesia Type: MAC Level of consciousness: awake and alert Pain management: pain level controlled Vital Signs Assessment: post-procedure vital signs reviewed and stable Respiratory status: spontaneous breathing, nonlabored ventilation, respiratory function stable and patient connected to nasal cannula oxygen Cardiovascular status: stable and blood pressure returned to baseline Anesthetic complications: no       Last Vitals:  Vitals:   05/14/16 1055 05/14/16 1119  BP: 129/74 123/68  Pulse: 60 (!) 55  Resp: 11 16  Temp:      Last Pain:  Vitals:   05/14/16 1119  TempSrc:   PainSc: 0-No pain                 Zahra Peffley DAVID

## 2016-05-14 NOTE — Interval H&P Note (Signed)
History and Physical Interval Note:  05/14/2016 9:15 AM  [name] [name]  has presented today for surgery, with the diagnosis of Stage 5 Kidney Disease  N18.5  The various methods of treatment have been discussed with the patient and family. After consideration of risks, benefits and other options for treatment, the patient has consented to  Procedure(s): ARTERIOVENOUS (AV) FISTULA CREATION- RIGHT ARM (Right) as a surgical intervention .  The patient's history has been reviewed, patient examined, no change in status, stable for surgery.  I have reviewed the patient's chart and labs.  Questions were answered to the patient's satisfaction.     Annamarie Major

## 2016-05-14 NOTE — Anesthesia Procedure Notes (Signed)
Procedure Name: MAC Date/Time: 05/14/2016 9:15 AM Performed by: Lillia Abed Pre-anesthesia Checklist: Patient identified, Emergency Drugs available, Suction available, Patient being monitored and Timeout performed Patient Re-evaluated:Patient Re-evaluated prior to inductionOxygen Delivery Method: Simple face mask

## 2016-05-14 NOTE — H&P (View-Only) (Signed)
[facility] [place]  Patient name: [name] [name] MRN: [id number] DOB: 1950-06-29 Sex: male  REFERRING PHYSICIAN: Dr. Florene Glen  REASON FOR CONSULT: HD Access  HPI: [name] [name] is a 65 y.o. male, who is referred today for dialysis access.  He has stage IV renal insufficiency and is not yet on dialysis.  He is right-handed.  His renal failure is secondary to diabetes and hypertension.  He does suffer from lower extremity edema.  He is on an ARB for hypertension.  He suffers from diabetes which is not well controlled.  He was in the hospital and 2016 with a blood sugar of 475.  His hypercholesterolemia is managed with a statin.  Past Medical History:  Diagnosis Date  . Chronic kidney disease    "starting to bother me now" (07/28/2012)  . Diabetes mellitus    type I and type II  . ED (erectile dysfunction)   . Hypertension   . Peripheral neuropathy ([name])     Family History  Problem Relation Age of Onset  . Colon polyps Mother   . Diabetes Father   . Diabetes Cousin     SOCIAL HISTORY: Social History   Social History  . Marital status: Married    Spouse name: N/A  . Number of children: N/A  . Years of education: N/A   Occupational History  .  [employer]   Social History Main Topics  . Smoking status: Former Smoker    Packs/day: 0.12    Years: 10.00    Types: Cigarettes    Quit date: 04/27/1986  . Smokeless tobacco: Never Used     Comment: 07/28/2012 "quit smoking cigarettes 20-30 yr ago"  . Alcohol use 2.4 oz/week    4 Cans of beer per week     Comment: daily to less than one a month  . Drug use: No  . Sexual activity: Not Currently   Other Topics Concern  . Not on file   Social History Narrative  . No narrative on file    No Known Allergies  Current Outpatient Prescriptions  Medication Sig Dispense Refill  . amLODipine (NORVASC) 10 MG tablet Take 1 tablet (10  mg total) by mouth daily. 90 tablet 3  . aspirin 81 MG tablet Take 1 tablet (81 mg total) by mouth daily. 30 tablet   . atorvastatin (LIPITOR) 10 MG tablet Take 1 tablet (10 mg total) by mouth daily. 90 tablet 3  . glucose blood (ONE TOUCH TEST STRIPS) test strip Test twice daily, dx E10.9, one touch verio flex 200 each 12  . hydrochlorothiazide (HYDRODIURIL) 25 MG tablet TAKE 1 TABLET BY MOUTH EVERY DAY 90 tablet 2  . Insulin Glargine (BASAGLAR KWIKPEN) 100 UNIT/ML Solostar Pen Inject 20 Units into the skin daily at 10 pm. 15 mL 2  . Insulin Pen Needle (CAREFINE PEN NEEDLES) 32G X 4 MM MISC Use 1x a day 100 each 11  . Lancets (ONETOUCH ULTRASOFT) lancets Test twice daily Dx E10.9 OneTouch Verio flex 200 each 12  . losartan (COZAAR) 100 MG tablet Take 1 tablet (100 mg total) by mouth daily. 90 tablet 3  . metFORMIN (GLUCOPHAGE) 1000 MG tablet Take 1  tablet in the morning and 1 tablet with dinner 200 tablet 3  . metFORMIN (GLUCOPHAGE) 1000 MG tablet ONE HALF TAB TWICE DAILY 200 tablet 2  . metoprolol (LOPRESSOR) 100 MG tablet 1 tablet daily in the morning 100 tablet  3  . sitaGLIPtin (JANUVIA) 50 MG tablet Take 1 tablet (50 mg total) by mouth daily. 30 tablet 2   No current facility-administered medications for this visit.     REVIEW OF SYSTEMS:  [X]  denotes positive finding, [ ]  denotes negative finding Cardiac  Comments:  Chest pain or chest pressure:    Shortness of breath upon exertion:    Short of breath when lying flat:    Irregular heart rhythm:        Vascular    Pain in calf, thigh, or hip brought on by ambulation:    Pain in feet at night that wakes you up from your sleep:     Blood clot in your veins:    Leg swelling:  x       Pulmonary    Oxygen at home:    Productive cough:     Wheezing:         Neurologic    Sudden weakness in arms or legs:     Sudden numbness in arms or legs:     Sudden onset of difficulty speaking or slurred speech:    Temporary loss of vision  in one eye:     Problems with dizziness:         Gastrointestinal    Blood in stool:     Vomited blood:         Genitourinary    Burning when urinating:     Blood in urine:        Psychiatric    Major depression:         Hematologic    Bleeding problems:    Problems with blood clotting too easily:        Skin    Rashes or ulcers:        Constitutional    Fever or chills:      PHYSICAL EXAM: Vitals:   04/27/16 1211  BP: (!) 208/94  Pulse: 80  Resp: 20  Temp: 98.6 F (37 C)  TempSrc: Oral  SpO2: 100%  Weight: 173 lb 4.8 oz (78.6 kg)  Height: 5\' 10"  (1.778 m)    GENERAL: The patient is a well-nourished male, in no acute distress. The vital signs are documented above. CARDIAC: There is a regular rate and rhythm.  VASCULAR: Palpable right radial pulse PULMONARY: Nonlabored respirations MUSCULOSKELETAL: There are no major deformities or cyanosis. NEUROLOGIC: No focal weakness or paresthesias are detected. SKIN: There are no ulcers or rashes noted. PSYCHIATRIC: The patient has a normal affect.  DATA:  I have reviewed his vascular lab studies: He has a high takeoff of the radial artery.  Waveforms are triphasic  Vein mapping shows a saphenous vein in the right arm that appears to be adequate for fistula creation.  This left cephalic vein appears occluded.  The right basilic vein was not visualized the left basilic vein was not visualized  ASSESSMENT AND PLAN: I discussed proceeding with a right brachiocephalic fistula.  This is been scheduled for Thursday, December 28.  I discussed the risks and benefits of the operation including the risk of steal, the risk of non-maturity, and the need for future interventions.  All of his questions were answered.   Annamarie Major, MD [facility] [phone] Pager [phone]

## 2016-05-15 ENCOUNTER — Encounter (HOSPITAL_COMMUNITY): Payer: Self-pay | Admitting: Surgery

## 2016-06-05 ENCOUNTER — Encounter (HOSPITAL_COMMUNITY): Payer: Self-pay | Admitting: *Deleted

## 2016-06-05 ENCOUNTER — Emergency Department (HOSPITAL_COMMUNITY)
Admission: EM | Admit: 2016-06-05 | Discharge: 2016-06-05 | Disposition: A | Discharge status: Home / Self Care | Payer: BC Managed Care – PPO | Attending: Emergency Medicine | Admitting: Emergency Medicine

## 2016-06-05 ENCOUNTER — Emergency Department (HOSPITAL_COMMUNITY): Payer: BC Managed Care – PPO

## 2016-06-05 DIAGNOSIS — Z79899 Other long term (current) drug therapy: Secondary | ICD-10-CM | POA: Insufficient documentation

## 2016-06-05 DIAGNOSIS — Z7982 Long term (current) use of aspirin: Secondary | ICD-10-CM | POA: Insufficient documentation

## 2016-06-05 DIAGNOSIS — I120 Hypertensive chronic kidney disease with stage 5 chronic kidney disease or end stage renal disease: Secondary | ICD-10-CM | POA: Insufficient documentation

## 2016-06-05 DIAGNOSIS — E1122 Type 2 diabetes mellitus with diabetic chronic kidney disease: Secondary | ICD-10-CM | POA: Diagnosis not present

## 2016-06-05 DIAGNOSIS — J189 Pneumonia, unspecified organism: Secondary | ICD-10-CM | POA: Diagnosis not present

## 2016-06-05 DIAGNOSIS — Z87891 Personal history of nicotine dependence: Secondary | ICD-10-CM | POA: Insufficient documentation

## 2016-06-05 DIAGNOSIS — E1140 Type 2 diabetes mellitus with diabetic neuropathy, unspecified: Secondary | ICD-10-CM | POA: Diagnosis not present

## 2016-06-05 DIAGNOSIS — J181 Lobar pneumonia, unspecified organism: Secondary | ICD-10-CM

## 2016-06-05 DIAGNOSIS — R0602 Shortness of breath: Secondary | ICD-10-CM | POA: Diagnosis present

## 2016-06-05 DIAGNOSIS — N189 Chronic kidney disease, unspecified: Secondary | ICD-10-CM

## 2016-06-05 DIAGNOSIS — N185 Chronic kidney disease, stage 5: Secondary | ICD-10-CM | POA: Insufficient documentation

## 2016-06-05 LAB — BASIC METABOLIC PANEL
Anion gap: 13 (ref 5–15)
BUN: 66 mg/dL — AB (ref 6–20)
CALCIUM: 8.5 mg/dL — AB (ref 8.9–10.3)
CHLORIDE: 111 mmol/L (ref 101–111)
CO2: 19 mmol/L — ABNORMAL LOW (ref 22–32)
CREATININE: 5.8 mg/dL — AB (ref 0.61–1.24)
GFR calc Af Amer: 10 mL/min — ABNORMAL LOW (ref >60)
GFR, EST NON AFRICAN AMERICAN: 9 mL/min — AB (ref >60)
Glucose, Bld: 47 mg/dL — ABNORMAL LOW (ref 65–99)
Potassium: 3.1 mmol/L — ABNORMAL LOW (ref 3.5–5.1)
SODIUM: 143 mmol/L (ref 135–145)

## 2016-06-05 LAB — CBC
HCT: 32 % — ABNORMAL LOW (ref 39.0–52.0)
Hemoglobin: 10.3 g/dL — ABNORMAL LOW (ref 13.0–17.0)
MCH: 27.4 pg (ref 26.0–34.0)
MCHC: 32.2 g/dL (ref 30.0–36.0)
MCV: 85.1 fL (ref 78.0–100.0)
PLATELETS: 315 10*3/uL (ref 150–400)
RBC: 3.76 MIL/uL — ABNORMAL LOW (ref 4.22–5.81)
RDW: 13.3 % (ref 11.5–15.5)
WBC: 7.3 10*3/uL (ref 4.0–10.5)

## 2016-06-05 LAB — CBG MONITORING, ED
Glucose-Capillary: 28 mg/dL — CL (ref 65–99)
Glucose-Capillary: 63 mg/dL — ABNORMAL LOW (ref 65–99)

## 2016-06-05 LAB — BRAIN NATRIURETIC PEPTIDE: B Natriuretic Peptide: 597.5 pg/mL — ABNORMAL HIGH (ref 0.0–100.0)

## 2016-06-05 LAB — I-STAT TROPONIN, ED: Troponin i, poc: 0.03 ng/mL (ref 0.00–0.08)

## 2016-06-05 MED ORDER — LEVOFLOXACIN 750 MG PO TABS
750.0000 mg | ORAL_TABLET | Freq: Once | ORAL | Status: AC
  Administered 2016-06-05: 750 mg via ORAL
  Filled 2016-06-05: qty 1

## 2016-06-05 MED ORDER — LEVOFLOXACIN 500 MG PO TABS: 500.0000 mg | ORAL_TABLET | ORAL | 0 refills | Status: DC

## 2016-06-05 NOTE — ED Notes (Signed)
Patient transported to X-ray 

## 2016-06-05 NOTE — ED Provider Notes (Signed)
[facility] DEPT Provider Note   CSN: [id number] Arrival date & time: 06/05/16  0451     History   Chief Complaint Chief Complaint  Patient presents with  . Shortness of Breath    HPI [name] [name] is a 66 y.o. male.  Patient presents to the emergency department for evaluation of shortness of breath. Patient has been experiencing orthopnea, dyspnea on exertion and leg swelling. He was seen by his primary doctor 2 days ago for this. His primary doctor was concerned about the edema. It was discovered at that time, however, that the patient was not taking his Lasix. He was put back on his Lasix dosing and his leg edema has gone down. Tonight he felt short of breath lying flat, however, comes to the ER. He has had associated cough and congestion with these symptoms. He has not noticed any fever or chills. There is no associated chest pain.      Past Medical History:  Diagnosis Date  . Anemia    as a younger man  . Arthritis    hands  . Chronic kidney disease    "starting to bother me now" (07/28/2012)  . Diabetes mellitus     type II  . Diarrhea, functional   . ED (erectile dysfunction)   . Hypertension   . Neuropathy ([place])   . Peripheral neuropathy [facility])     Patient Active Problem List   Diagnosis Date Noted  . Preop cardiovascular exam 04/27/2016  . Uncontrolled type 2 diabetes mellitus with diabetic nephropathy, with long-term current use of insulin ([place]) 06/21/2015  . Hypercholesteremia 12/11/2014  . Essential hypertension 09/10/2014  . SIRS (systemic inflammatory response syndrome) () 08/01/2014  . Bronchitis   . Sepsis ([place]) 07/31/2014  . Syncope due to orthostatic hypotension 07/28/2012  . Chronic kidney disease, stage V ([place]) 07/28/2012  . Acute kidney failure ([place]) 07/28/2012  . Hereditary and idiopathic peripheral neuropathy 07/20/2007    Past Surgical History:  Procedure Laterality Date  . APPENDECTOMY  1950's  . AV FISTULA PLACEMENT Right  05/14/2016   Procedure: ARTERIOVENOUS (AV) FISTULA CREATION- RIGHT ARM;  Surgeon: Serafina Mitchell, MD;  Location: [facility];  Service: Vascular;  Laterality: Right;  . COLONOSCOPY         Home Medications    Prior to Admission medications   Medication Sig Start Date End Date Taking? Authorizing Provider  amLODipine (NORVASC) 10 MG tablet Take 1 tablet (10 mg total) by mouth daily. 05/29/15  Yes Dorena Cookey, MD  aspirin 81 MG tablet Take 1 tablet (81 mg total) by mouth daily. 08/09/14  Yes Marletta Lor, MD  calcitRIOL (ROCALTROL) 0.25 MCG capsule Take 0.25 mcg by mouth daily.   Yes Historical Provider, MD  cloNIDine (CATAPRES) 0.1 MG tablet Take 0.1 mg by mouth 2 (two) times daily.   Yes Historical Provider, MD  furosemide (LASIX) 80 MG tablet Take 80 mg by mouth daily.    Yes Historical Provider, MD  Insulin Glargine (BASAGLAR KWIKPEN) 100 UNIT/ML Solostar Pen Inject 20 Units into the skin daily at 10 pm. 06/21/15  Yes Philemon Kingdom, MD  metoprolol (LOPRESSOR) 100 MG tablet 1 tablet daily in the morning 08/14/15  Yes Dorena Cookey, MD  levofloxacin (LEVAQUIN) 500 MG tablet Take 1 tablet (500 mg total) by mouth every other day. Start on 06/07/2016 06/05/16   Orpah Greek, MD    Family History Family History  Problem Relation Age of Onset  . Colon polyps Mother   .  Stroke Mother   . Diabetes Father   . Diabetes Cousin     Social History Social History  Substance Use Topics  . Smoking status: Former Smoker    Packs/day: 0.12    Years: 10.00    Types: Cigarettes    Quit date: 04/27/1986  . Smokeless tobacco: Never Used     Comment: 07/28/2012 "quit smoking cigarettes 20-30 yr ago"  . Alcohol use Yes     Comment: maybe a couple a month     Allergies   No known allergies   Review of Systems Review of Systems  Respiratory: Positive for cough and shortness of breath.   Cardiovascular: Positive for leg swelling. Negative for chest pain.  All other systems  reviewed and are negative.    Physical Exam Updated Vital Signs BP 144/67   Pulse 70   Temp 97.5 F (36.4 C)   Resp 12   SpO2 97%   Physical Exam  Constitutional: He is oriented to person, place, and time. He appears well-developed and well-nourished. No distress.  HENT:  Head: Normocephalic and atraumatic.  Right Ear: Hearing normal.  Left Ear: Hearing normal.  Nose: Nose normal.  Mouth/Throat: Oropharynx is clear and moist and mucous membranes are normal.  Eyes: Conjunctivae and EOM are normal. Pupils are equal, round, and reactive to light.  Neck: Normal range of motion. Neck supple.  Cardiovascular: Regular rhythm, S1 normal and S2 normal.  Exam reveals no gallop and no friction rub.   No murmur heard. Pulmonary/Chest: Effort normal and breath sounds normal. No respiratory distress. He exhibits no tenderness.  Abdominal: Soft. Normal appearance and bowel sounds are normal. There is no hepatosplenomegaly. There is no tenderness. There is no rebound, no guarding, no tenderness at McBurney's point and negative Murphy's sign. No hernia.  Musculoskeletal: Normal range of motion. He exhibits edema (trace, L>R).  Neurological: He is alert and oriented to person, place, and time. He has normal strength. No cranial nerve deficit or sensory deficit. Coordination normal. GCS eye subscore is 4. GCS verbal subscore is 5. GCS motor subscore is 6.  Skin: Skin is warm, dry and intact. No rash noted. No cyanosis.  Psychiatric: He has a normal mood and affect. His speech is normal and behavior is normal. Thought content normal.  Nursing note and vitals reviewed.    ED Treatments / Results  Labs (all labs ordered are listed, but only abnormal results are displayed) Labs Reviewed  BASIC METABOLIC PANEL - Abnormal; Notable for the following:       Result Value   Potassium 3.1 (*)    CO2 19 (*)    Glucose, Bld 47 (*)    BUN 66 (*)    Creatinine, Ser 5.80 (*)    Calcium 8.5 (*)    GFR  calc non Af Amer 9 (*)    GFR calc Af Amer 10 (*)    All other components within normal limits  CBC - Abnormal; Notable for the following:    RBC 3.76 (*)    Hemoglobin 10.3 (*)    HCT 32.0 (*)    All other components within normal limits  BRAIN NATRIURETIC PEPTIDE - Abnormal; Notable for the following:    B Natriuretic Peptide 597.5 (*)    All other components within normal limits  CBG MONITORING, ED - Abnormal; Notable for the following:    Glucose-Capillary 28 (*)    All other components within normal limits  CBG MONITORING, ED - Abnormal; Notable for  the following:    Glucose-Capillary 63 (*)    All other components within normal limits  I-STAT TROPOININ, ED    EKG  EKG Interpretation  Date/Time:  Friday June 05 2016 05:01:41 EST Ventricular Rate:  68 PR Interval:    QRS Duration: 77 QT Interval:  441 QTC Calculation: 469 R Axis:   80 Text Interpretation:  Sinus rhythm Left atrial enlargement Probable left ventricular hypertrophy Nonspecific ST and T wave abnormality Baseline wander in lead(s) V4 V6 No significant change since last tracing Confirmed by Maryalyce Sanjuan  MD, [place] [id number]) on 06/05/2016 5:35:15 AM       Radiology Dg Chest 2 View  Result Date: 06/05/2016 CLINICAL DATA:  Shortness of breath.  Cough and congestion EXAM: CHEST  2 VIEW COMPARISON:  07/31/2014 FINDINGS: The lower lung volumes from prior exam with lung base volume loss. There are ill-defined bibasilar opacities. The heart is enlarged. Mild vascular congestion. No pneumothorax. No acute osseous abnormality. IMPRESSION: Ill-defined hazy bibasilar opacities with volume loss. Suspect combination of pleural fluid and airspace disease, which may be atelectasis or pneumonia, pneumonia is favored at the right lung base. Cardiomegaly with vascular congestion. Electronically Signed   By: Jeb Levering M.D.   On: 06/05/2016 05:34    Procedures Procedures (including critical care time)  Medications  Ordered in ED Medications  levofloxacin (LEVAQUIN) tablet 750 mg (not administered)     Initial Impression / Assessment and Plan / ED Course  I have reviewed the triage vital signs and the nursing notes.  Pertinent labs & imaging results that were available during my care of the patient were reviewed by me and considered in my medical decision making (see chart for details).    Patient presents for evaluation of difficulty breathing. Patient's primary doctor was concerned about the possibility of volume overload 2 days ago. Patient had not been taking his Lasix, was placed back on his Lasix. His peripheral edema has improved but is still short of breath. Examination does not suggest any significant volume overload. Chest x-ray is consistent with pneumonia, not volume overload. Patient has normal oxygen saturation of 99% without any tachypnea or dyspnea currently.  Patient is noted to have a creatinine of 5.8. He does not have any lab draws in our system since May of last year. At that time he was 2.4. He has, however, had a fistula placed last month at the direction of his nephrologist, Dr. Florene Glen. This is presumably for progressively worsening kidney disease. He tells me that he is pursuing renal transplant.  Dr. Louie Boston, on call for nephrology was contacted. She does not have access to any of the office labs at this time, but also felt that with the fistula surgery last month, that his creatinine has been progressively worsening and that the 5.8 does not need to be chased at this time in the hospital. As he is symptomatically improving on his 80 mg of Lasix, having not taking it until 2 days ago, recommends continuing at this dose. Office will contact the patient he will be seen for follow-up.  Patient will be initiated on Levaquin for community-acquired pneumonia. He will return to the ER for any worsening shortness of breath.  Final Clinical Impressions(s) / ED Diagnoses   Final  diagnoses:  Chronic renal failure, unspecified CKD stage  Community acquired pneumonia of right lower lobe of lung ([name])    New Prescriptions New Prescriptions   LEVOFLOXACIN (LEVAQUIN) 500 MG TABLET    Take 1 tablet (500  mg total) by mouth every other day. Start on 06/07/2016     Orpah Greek, MD 06/05/16 [phone]

## 2016-06-05 NOTE — ED Notes (Signed)
Pt given apple juice and crackers; CBG 63

## 2016-06-05 NOTE — ED Triage Notes (Addendum)
Pt c/o SOB for a few days with BLE. SOB worsens on exertion and when lying flat.  Pt has fistula to R forearm for dialysis but has not started yet. Was seen on Tuesday by PCP and were concerned for fluid buildup in lungs

## 2016-06-05 NOTE — ED Notes (Addendum)
Pt given 2 cups of apple juice PO, MD notified

## 2016-06-07 ENCOUNTER — Other Ambulatory Visit: Payer: Self-pay | Admitting: Family Medicine

## 2016-06-23 ENCOUNTER — Encounter: Payer: Self-pay | Admitting: Surgery

## 2016-06-29 ENCOUNTER — Ambulatory Visit (INDEPENDENT_AMBULATORY_CARE_PROVIDER_SITE_OTHER): Payer: Self-pay | Admitting: Surgery

## 2016-06-29 ENCOUNTER — Encounter: Payer: Self-pay | Admitting: Surgery

## 2016-06-29 ENCOUNTER — Ambulatory Visit (HOSPITAL_COMMUNITY)
Admission: RE | Admit: 2016-06-29 | Discharge: 2016-06-29 | Disposition: A | Discharge status: Home / Self Care | Payer: BC Managed Care – PPO | Source: Ambulatory Visit | Attending: Surgery | Admitting: Surgery

## 2016-06-29 VITALS — BP 172/76 | HR 65 | Temp 98.4°F | Resp 20 | Ht 70.0 in | Wt 167.3 lb

## 2016-06-29 DIAGNOSIS — Z48812 Encounter for surgical aftercare following surgery on the circulatory system: Secondary | ICD-10-CM | POA: Diagnosis not present

## 2016-06-29 DIAGNOSIS — N184 Chronic kidney disease, stage 4 (severe): Secondary | ICD-10-CM | POA: Diagnosis not present

## 2016-06-29 NOTE — Addendum Note (Signed)
Addended by: Lianne Cure A on: 06/29/2016 02:25 PM   Modules accepted: Orders

## 2016-06-29 NOTE — Progress Notes (Signed)
   Patient name: [name] [name] MRN: [id number] DOB: [birth year] Sex: male  REASON FOR VISIT:     f/u FIstula  HISTORY OF PRESENT ILLNESS:   [name] [name] is a 66 y.o. male returns today for follow-up of his right radiocephalic fistula creation.  This was done on 05/14/2016.  He reports no complaints of steal or arm swelling.  He is not yet on dialysis.  CURRENT MEDICATIONS:    Current Outpatient Prescriptions  Medication Sig Dispense Refill  . amLODipine (NORVASC) 10 MG tablet Take 1 tablet (10 mg total) by mouth daily. 90 tablet 3  . aspirin 81 MG tablet Take 1 tablet (81 mg total) by mouth daily. 30 tablet   . calcitRIOL (ROCALTROL) 0.25 MCG capsule Take 0.25 mcg by mouth daily.    . cloNIDine (CATAPRES) 0.1 MG tablet Take 0.1 mg by mouth 2 (two) times daily.    . furosemide (LASIX) 80 MG tablet Take 80 mg by mouth daily.     . Insulin Glargine (BASAGLAR KWIKPEN) 100 UNIT/ML Solostar Pen Inject 20 Units into the skin daily at 10 pm. 15 mL 2  . levofloxacin (LEVAQUIN) 500 MG tablet Take 1 tablet (500 mg total) by mouth every other day. Start on 06/07/2016 7 tablet 0  . losartan (COZAAR) 100 MG tablet TAKE 1 TABLET (100 MG TOTAL) BY MOUTH DAILY. 90 tablet 4  . metoprolol (LOPRESSOR) 100 MG tablet 1 tablet daily in the morning 100 tablet 3   No current facility-administered medications for this visit.     REVIEW OF SYSTEMS:   [X]  denotes positive finding, [ ]  denotes negative finding Cardiac  Comments:  Chest pain or chest pressure:    Shortness of breath upon exertion:    Short of breath when lying flat:    Irregular heart rhythm:    Constitutional    Fever or chills:      PHYSICAL EXAM:   Vitals:   06/29/16 0857 06/29/16 0859  BP: (!) 175/79 (!) 172/76  Pulse: 65   Resp: 20   Temp: 98.4 F (36.9 C)   TempSrc: Oral   SpO2: 100%   Weight: 167 lb 4.8 oz (75.9 kg)   Height: 5\' 10"  (1.778 m)     GENERAL: The patient is a  well-nourished male, in no acute distress. The vital signs are documented above. CARDIOVASCULAR: There is a regular rate and rhythm. PULMONARY: Non-labored respirations Excellent thrill within fistula  STUDIES:   Ultrasound evaluation shows that the diameters are all greater than 0.5 cm.  There appears to be a stenotic area in the mid outflow vein possibly related to a retained valve.   MEDICAL ISSUES:   The patient has excellent thrill within his fistula.  There is concern over a possible stenotic valve in the mid forearm.  Because he is not yet on dialysis, I would like to reevaluate this in 3 months with a duplex ultrasound.  If this appears to be stenotic, I would proceed with fistulogram for further evaluation and possible intervention.  Annamarie Major, MD [facility] [phone] Pager [phone]

## 2016-07-06 ENCOUNTER — Other Ambulatory Visit: Payer: Self-pay | Admitting: Internal Medicine

## 2016-09-16 LAB — CBC AND DIFFERENTIAL
HEMATOCRIT: 34 % — AB (ref 41–53)
Hemoglobin: 11.5 g/dL — AB (ref 13.5–17.5)
Neutrophils Absolute: 5 /uL
Platelets: 354 10*3/uL (ref 150–399)
WBC: 7.5 10*3/mL

## 2016-09-16 LAB — BASIC METABOLIC PANEL
BUN: 70 mg/dL — AB (ref 4–21)
CREATININE: 7.2 mg/dL — AB (ref 0.6–1.3)
GLUCOSE: 112 mg/dL
POTASSIUM: 5.6 mmol/L — AB (ref 3.4–5.3)
SODIUM: 139 mmol/L (ref 137–147)

## 2016-09-21 ENCOUNTER — Encounter: Payer: Self-pay | Admitting: Family Medicine

## 2016-09-29 ENCOUNTER — Encounter: Payer: Self-pay | Admitting: Surgery

## 2016-09-29 DIAGNOSIS — R6510 Systemic inflammatory response syndrome (SIRS) of non-infectious origin without acute organ dysfunction: Secondary | ICD-10-CM | POA: Insufficient documentation

## 2016-09-29 DIAGNOSIS — D631 Anemia in chronic kidney disease: Secondary | ICD-10-CM | POA: Diagnosis present

## 2016-09-29 DIAGNOSIS — E7800 Pure hypercholesterolemia, unspecified: Secondary | ICD-10-CM | POA: Insufficient documentation

## 2016-09-29 DIAGNOSIS — E876 Hypokalemia: Secondary | ICD-10-CM | POA: Diagnosis present

## 2016-09-29 DIAGNOSIS — D689 Coagulation defect, unspecified: Secondary | ICD-10-CM | POA: Insufficient documentation

## 2016-09-29 DIAGNOSIS — N189 Chronic kidney disease, unspecified: Secondary | ICD-10-CM | POA: Diagnosis present

## 2016-09-29 DIAGNOSIS — N186 End stage renal disease: Secondary | ICD-10-CM | POA: Insufficient documentation

## 2016-09-29 DIAGNOSIS — R600 Localized edema: Secondary | ICD-10-CM | POA: Insufficient documentation

## 2016-10-05 ENCOUNTER — Inpatient Hospital Stay (HOSPITAL_COMMUNITY): Admission: RE | Admit: 2016-10-05 | Payer: BC Managed Care – PPO | Source: Ambulatory Visit

## 2016-10-05 ENCOUNTER — Ambulatory Visit: Payer: BC Managed Care – PPO | Admitting: Surgery

## 2016-10-05 DIAGNOSIS — D509 Iron deficiency anemia, unspecified: Secondary | ICD-10-CM | POA: Insufficient documentation

## 2016-10-09 ENCOUNTER — Telehealth: Payer: Self-pay | Admitting: Family Medicine

## 2016-10-09 ENCOUNTER — Other Ambulatory Visit: Payer: Self-pay | Admitting: Family Medicine

## 2016-10-09 NOTE — Telephone Encounter (Signed)
lmom for pt to call back

## 2016-10-09 NOTE — Telephone Encounter (Signed)
Pt is past due for yearly and diabetes check.  Please help him to get on a provider's schedule if unable to come see Dr. Sherren Mocha.  Thanks!!!

## 2016-10-09 NOTE — Telephone Encounter (Signed)
Sent to the pharmacy with no refills.  Pt is past due for yearly and diabetes check.  Message sent to scheduling.

## 2016-10-13 NOTE — Telephone Encounter (Signed)
lmom for pt to call back

## 2016-10-15 NOTE — Telephone Encounter (Signed)
lmom for pt to call back

## 2016-10-16 DIAGNOSIS — E440 Moderate protein-calorie malnutrition: Secondary | ICD-10-CM | POA: Insufficient documentation

## 2016-10-17 ENCOUNTER — Other Ambulatory Visit: Payer: Self-pay | Admitting: Family Medicine

## 2016-10-19 NOTE — Telephone Encounter (Signed)
Denied.  Pt past due for appt.  Scheduling has tried to reach the pt multiple times.  See phone note from 10/09/16.

## 2017-02-05 ENCOUNTER — Encounter: Payer: Self-pay | Admitting: Family Medicine

## 2017-02-08 ENCOUNTER — Telehealth (HOSPITAL_COMMUNITY): Payer: Self-pay | Admitting: Nephrology

## 2017-02-08 ENCOUNTER — Other Ambulatory Visit: Payer: Self-pay | Admitting: Nephrology

## 2017-02-08 DIAGNOSIS — C61 Malignant neoplasm of prostate: Secondary | ICD-10-CM | POA: Insufficient documentation

## 2017-02-08 DIAGNOSIS — Z7682 Awaiting organ transplant status: Secondary | ICD-10-CM

## 2017-02-10 ENCOUNTER — Other Ambulatory Visit: Payer: BC Managed Care – PPO

## 2017-02-11 ENCOUNTER — Other Ambulatory Visit: Payer: Self-pay | Admitting: Nephrology

## 2017-02-11 DIAGNOSIS — Z7682 Awaiting organ transplant status: Secondary | ICD-10-CM

## 2017-02-12 DIAGNOSIS — E119 Type 2 diabetes mellitus without complications: Secondary | ICD-10-CM | POA: Insufficient documentation

## 2017-02-12 DIAGNOSIS — E13319 Other specified diabetes mellitus with unspecified diabetic retinopathy without macular edema: Secondary | ICD-10-CM | POA: Insufficient documentation

## 2017-02-12 DIAGNOSIS — R972 Elevated prostate specific antigen [PSA]: Secondary | ICD-10-CM | POA: Insufficient documentation

## 2017-02-12 DIAGNOSIS — N186 End stage renal disease: Secondary | ICD-10-CM | POA: Insufficient documentation

## 2017-02-12 DIAGNOSIS — N2581 Secondary hyperparathyroidism of renal origin: Secondary | ICD-10-CM | POA: Insufficient documentation

## 2017-02-12 NOTE — Telephone Encounter (Signed)
User: Cherie Dark A Date/time: 02/10/17 11:54 AM  Comment: Phone went straight to VM.Marland KitchenMarland KitchenI lmsg for him to CB to get scheduled for his Stress echo and regular echo.   Context:  Outcome: Left Message  Phone number: [phone] Phone Type: Home Phone  Comm. type: Telephone Call type: Outgoing  Contact: [name] [name] E Relation to patient: Self    User: Cherie Dark A Date/time: 02/09/17 3:20 PM  Comment: Called pt and lmsg for her to CB to sch echo.   Context:  Outcome: Left Message  Phone number: [phone] Phone Type: Home Phone  Comm. type: Telephone Call type: Outgoing  Contact: [name] [name] E Relation to patient: Self    User: Cherie Dark A Date/time: 02/08/17 2:17 PM  Comment: Called pt and lmsg for him to CB to schedule echo and stress echo.   Context:  Outcome: Left Message  Phone number: [phone] Phone Type: Home Phone  Comm. type: Telephone Call type: Outgoing  Contact: [name] [name] E Relation to patient: Self

## 2017-02-25 ENCOUNTER — Telehealth (HOSPITAL_COMMUNITY): Payer: Self-pay

## 2017-02-25 NOTE — Telephone Encounter (Signed)
Attempted to contact the patient to give him instructions for his Stress Echo and he wasn't there. I will try again later. S.Solei Wubben EMTP

## 2017-02-26 ENCOUNTER — Ambulatory Visit (HOSPITAL_COMMUNITY): Payer: BC Managed Care – PPO | Attending: Internal Medicine

## 2017-02-26 ENCOUNTER — Other Ambulatory Visit: Payer: Self-pay

## 2017-02-26 DIAGNOSIS — E119 Type 2 diabetes mellitus without complications: Secondary | ICD-10-CM | POA: Insufficient documentation

## 2017-02-26 DIAGNOSIS — I10 Essential (primary) hypertension: Secondary | ICD-10-CM | POA: Diagnosis not present

## 2017-02-26 DIAGNOSIS — Z87891 Personal history of nicotine dependence: Secondary | ICD-10-CM | POA: Insufficient documentation

## 2017-02-26 DIAGNOSIS — Z7682 Awaiting organ transplant status: Secondary | ICD-10-CM | POA: Insufficient documentation

## 2017-03-01 ENCOUNTER — Other Ambulatory Visit: Payer: Self-pay | Admitting: Nephrology

## 2017-03-01 ENCOUNTER — Ambulatory Visit (HOSPITAL_COMMUNITY): Payer: BC Managed Care – PPO

## 2017-03-01 ENCOUNTER — Ambulatory Visit (HOSPITAL_COMMUNITY): Payer: BC Managed Care – PPO | Attending: Cardiovascular Disease

## 2017-03-01 DIAGNOSIS — Z7682 Awaiting organ transplant status: Secondary | ICD-10-CM

## 2017-03-01 DIAGNOSIS — R739 Hyperglycemia, unspecified: Secondary | ICD-10-CM | POA: Diagnosis not present

## 2017-03-01 DIAGNOSIS — I951 Orthostatic hypotension: Secondary | ICD-10-CM | POA: Diagnosis not present

## 2017-03-17 ENCOUNTER — Ambulatory Visit (INDEPENDENT_AMBULATORY_CARE_PROVIDER_SITE_OTHER): Payer: BC Managed Care – PPO | Admitting: Podiatry

## 2017-03-17 ENCOUNTER — Encounter: Payer: Self-pay | Admitting: Podiatry

## 2017-03-17 VITALS — BP 190/92 | HR 78

## 2017-03-17 DIAGNOSIS — E1142 Type 2 diabetes mellitus with diabetic polyneuropathy: Secondary | ICD-10-CM

## 2017-03-17 DIAGNOSIS — E0821 Diabetes mellitus due to underlying condition with diabetic nephropathy: Secondary | ICD-10-CM | POA: Diagnosis not present

## 2017-03-17 DIAGNOSIS — M79675 Pain in left toe(s): Secondary | ICD-10-CM

## 2017-03-17 DIAGNOSIS — M79674 Pain in right toe(s): Secondary | ICD-10-CM

## 2017-03-17 DIAGNOSIS — B351 Tinea unguium: Secondary | ICD-10-CM

## 2017-03-18 NOTE — Progress Notes (Signed)
   Subjective:    Patient ID: [name] [name], male    DOB: [birth year], 66 y.o.   MRN: [id number]  HPI this patient presents the office with chief complaint of long thick painful nails.  He says his nails are painful walking and wearing his shoes.  He also says he has painful calluses which are painful walking and wearing his shoes  . This patient has diabetes and is on dialysis.  He presents the office today stating he desires to have his feet examined since he believes this is important for his examination for kidney transplant.  He presents the office today for an evaluation and treatment of his painful diabetic feet.    Review of Systems  All other systems reviewed and are negative.      Objective:   Physical Exam General Appearance  Alert, conversant and in no acute stress.  Vascular  Dorsalis pedis and posterior pulses are palpable  bilaterally.  Capillary return is within normal limits  Bilaterally. Temperature is within normal limits  Bilaterally  Neurologic  Senn-Weinstein monofilament wire test dwithin normal limits.  bilaterally. Muscle power  Within normal limits bilaterally.  Nails Thick disfigured discolored nails with subungual debride bilaterally from hallux to fifth toes bilaterally. No evidence of bacterial infection or drainage bilaterally.  Orthopedic  No limitations of motion of motion feet bilaterally.  No crepitus or effusions noted.  No bony pathology or digital deformities noted.  Skin  normotropic skin  noted bilaterally.  No signs of infections or ulcers noted.  Potokeratosis sub 2 right and sub 3 left.        Assessment & Plan:  Onychomycosis  B/L  Porokeratosis  B/L  Diabetes with neuropathy.  IE  Debridement of nails.  Debridement of porokeratosis  B/L.  Diabetic foot exam was performed.  RTC 10 weeks.   Gardiner Barefoot DPM

## 2017-05-28 ENCOUNTER — Ambulatory Visit: Payer: BC Managed Care – PPO | Admitting: Podiatry

## 2017-06-22 ENCOUNTER — Ambulatory Visit: Payer: BC Managed Care – PPO | Admitting: Podiatry

## 2017-08-19 ENCOUNTER — Other Ambulatory Visit: Payer: BC Managed Care – PPO

## 2017-08-27 ENCOUNTER — Ambulatory Visit
Admission: RE | Admit: 2017-08-27 | Discharge: 2017-08-27 | Disposition: A | Discharge status: Home / Self Care | Payer: BC Managed Care – PPO | Source: Ambulatory Visit | Attending: Nephrology | Admitting: Nephrology

## 2017-08-27 DIAGNOSIS — Z7682 Awaiting organ transplant status: Secondary | ICD-10-CM

## 2017-09-17 ENCOUNTER — Encounter: Payer: Self-pay | Admitting: Family Medicine

## 2017-09-24 ENCOUNTER — Ambulatory Visit (INDEPENDENT_AMBULATORY_CARE_PROVIDER_SITE_OTHER): Payer: BC Managed Care – PPO | Admitting: Podiatry

## 2017-09-24 ENCOUNTER — Encounter: Payer: Self-pay | Admitting: Podiatry

## 2017-09-24 DIAGNOSIS — M79675 Pain in left toe(s): Secondary | ICD-10-CM

## 2017-09-24 DIAGNOSIS — E1142 Type 2 diabetes mellitus with diabetic polyneuropathy: Secondary | ICD-10-CM | POA: Diagnosis not present

## 2017-09-24 DIAGNOSIS — E0821 Diabetes mellitus due to underlying condition with diabetic nephropathy: Secondary | ICD-10-CM

## 2017-09-24 DIAGNOSIS — B351 Tinea unguium: Secondary | ICD-10-CM

## 2017-09-24 DIAGNOSIS — M79674 Pain in right toe(s): Secondary | ICD-10-CM | POA: Diagnosis not present

## 2017-09-24 DIAGNOSIS — Q828 Other specified congenital malformations of skin: Secondary | ICD-10-CM

## 2017-09-24 NOTE — Progress Notes (Signed)
Complaint:  Visit Type: Patient returns to my office for continued preventative foot care services. Complaint: Patient states" my nails have grown long and thick and become painful to walk and wear shoes" Patient has been diagnosed with DM with neuropathy. The patient presents for preventative foot care services. No changes to ROS.  Patient has painful callus both feet.  Podiatric Exam: Vascular: dorsalis pedis and posterior tibial pulses are palpable bilateral. Capillary return is immediate. Temperature gradient is WNL. Skin turgor WNL  Sensorium: Normal Semmes Weinstein monofilament test. Normal tactile sensation bilaterally. Nail Exam: Pt has thick disfigured discolored nails with subungual debris noted bilateral entire nail hallux through fifth toenails Ulcer Exam: There is no evidence of ulcer or pre-ulcerative changes or infection. Orthopedic Exam: Muscle tone and strength are WNL. No limitations in general ROM. No crepitus or effusions noted. Foot type and digits show no abnormalities.HAV  B/L. Skin:  Porokeratosis sub 2 left  and sub 3 right.  No infection or ulcers.  Hemmorrhagic callus distal aspect right hallux.  Diagnosis:  Onychomycosis, , Pain in right toe, pain in left toes Porokeratosis  B/L.  Treatment & Plan Procedures and Treatment: Consent by patient was obtained for treatment procedures.   Debridement of mycotic and hypertrophic toenails, 1 through 5 bilateral and clearing of subungual debris. No ulceration, no infection noted.  Return Visit-Office Procedure: Patient instructed to return to the office for a follow up visit 3 months for continued evaluation and treatment.    Gardiner Barefoot DPM

## 2017-11-24 DIAGNOSIS — Z79899 Other long term (current) drug therapy: Secondary | ICD-10-CM | POA: Insufficient documentation

## 2017-11-24 DIAGNOSIS — R17 Unspecified jaundice: Secondary | ICD-10-CM | POA: Insufficient documentation

## 2017-11-29 DIAGNOSIS — N281 Cyst of kidney, acquired: Secondary | ICD-10-CM | POA: Insufficient documentation

## 2017-12-17 ENCOUNTER — Ambulatory Visit: Payer: BC Managed Care – PPO | Admitting: Podiatry

## 2017-12-17 ENCOUNTER — Encounter: Payer: Self-pay | Admitting: Podiatry

## 2017-12-17 DIAGNOSIS — B351 Tinea unguium: Secondary | ICD-10-CM

## 2017-12-17 DIAGNOSIS — Q828 Other specified congenital malformations of skin: Secondary | ICD-10-CM

## 2017-12-17 DIAGNOSIS — M79675 Pain in left toe(s): Secondary | ICD-10-CM

## 2017-12-17 DIAGNOSIS — M79674 Pain in right toe(s): Secondary | ICD-10-CM

## 2017-12-17 DIAGNOSIS — E0821 Diabetes mellitus due to underlying condition with diabetic nephropathy: Secondary | ICD-10-CM

## 2017-12-17 NOTE — Progress Notes (Signed)
Complaint:  Visit Type: Patient returns to my office for continued preventative foot care services. Complaint: Patient states" my nails have grown long and thick and become painful to walk and wear shoes" Patient has been diagnosed with DM with neuropathy. The patient presents for preventative foot care services. No changes to ROS.  Patient has painful callus both feet.  Podiatric Exam: Vascular: dorsalis pedis and posterior tibial pulses are palpable bilateral. Capillary return is immediate. Temperature gradient is WNL. Skin turgor WNL  Sensorium: Normal Semmes Weinstein monofilament test. Normal tactile sensation bilaterally. Nail Exam: Pt has thick disfigured discolored nails with subungual debris noted bilateral entire nail hallux through fifth toenails Ulcer Exam: There is no evidence of ulcer or pre-ulcerative changes or infection. Orthopedic Exam: Muscle tone and strength are WNL. No limitations in general ROM. No crepitus or effusions noted. Foot type and digits show no abnormalities.HAV  B/L. Skin:  Porokeratosis sub 2 left  and sub 3 right.  No infection or ulcers.    Diagnosis:  Onychomycosis, , Pain in right toe, pain in left toes Porokeratosis  B/L.  Treatment & Plan Procedures and Treatment: Consent by patient was obtained for treatment procedures.   Debridement of mycotic and hypertrophic toenails, 1 through 5 bilateral and clearing of subungual debris. No ulceration, no infection noted.  Return Visit-Office Procedure: Patient instructed to return to the office for a follow up visit 3 months for continued evaluation and treatment.    Gardiner Barefoot DPM

## 2018-02-22 LAB — PSA

## 2018-03-15 ENCOUNTER — Encounter: Payer: Self-pay | Admitting: Family Medicine

## 2018-03-18 ENCOUNTER — Ambulatory Visit: Payer: BC Managed Care – PPO | Admitting: Podiatry

## 2018-03-18 ENCOUNTER — Encounter: Payer: Self-pay | Admitting: Podiatry

## 2018-03-18 DIAGNOSIS — Q828 Other specified congenital malformations of skin: Secondary | ICD-10-CM

## 2018-03-18 DIAGNOSIS — B351 Tinea unguium: Secondary | ICD-10-CM

## 2018-03-18 DIAGNOSIS — E0821 Diabetes mellitus due to underlying condition with diabetic nephropathy: Secondary | ICD-10-CM | POA: Diagnosis not present

## 2018-03-18 DIAGNOSIS — M79674 Pain in right toe(s): Secondary | ICD-10-CM

## 2018-03-18 DIAGNOSIS — M79675 Pain in left toe(s): Secondary | ICD-10-CM | POA: Diagnosis not present

## 2018-03-18 NOTE — Progress Notes (Signed)
Complaint:  Visit Type: Patient returns to my office for continued preventative foot care services. Complaint: Patient states" my nails have grown long and thick and become painful to walk and wear shoes" Patient has been diagnosed with DM with neuropathy. The patient presents for preventative foot care services. No changes to ROS.  Patient has painful callus right foot   Podiatric Exam: Vascular: dorsalis pedis and posterior tibial pulses are palpable bilateral. Capillary return is immediate. Temperature gradient is WNL. Skin turgor WNL  Sensorium: Normal Semmes Weinstein monofilament test. Normal tactile sensation bilaterally. Nail Exam: Pt has thick disfigured discolored nails with subungual debris noted bilateral entire nail hallux through fifth toenails Ulcer Exam: There is no evidence of ulcer or pre-ulcerative changes or infection. Orthopedic Exam: Muscle tone and strength are WNL. No limitations in general ROM. No crepitus or effusions noted. Foot type and digits show no abnormalities.HAV  B/L. Skin:  Porokeratosis  sub 3 right.  No infection or ulcers.    Diagnosis:  Onychomycosis, , Pain in right toe, pain in left toes Porokeratosis   right  Treatment & Plan Procedures and Treatment: Consent by patient was obtained for treatment procedures.   Debridement of mycotic and hypertrophic toenails, 1 through 5 bilateral and clearing of subungual debris. No ulceration, no infection noted. Debridement of porokeratosis  right Return Visit-Office Procedure: Patient instructed to return to the office for a follow up visit 3 months for continued evaluation and treatment.    Gardiner Barefoot DPM

## 2018-05-26 DIAGNOSIS — Z992 Dependence on renal dialysis: Secondary | ICD-10-CM | POA: Insufficient documentation

## 2018-05-27 ENCOUNTER — Ambulatory Visit (INDEPENDENT_AMBULATORY_CARE_PROVIDER_SITE_OTHER): Payer: BC Managed Care – PPO | Admitting: Podiatry

## 2018-05-27 ENCOUNTER — Encounter: Payer: Self-pay | Admitting: Podiatry

## 2018-05-27 DIAGNOSIS — Q828 Other specified congenital malformations of skin: Secondary | ICD-10-CM

## 2018-05-27 DIAGNOSIS — B351 Tinea unguium: Secondary | ICD-10-CM | POA: Diagnosis not present

## 2018-05-27 DIAGNOSIS — M79674 Pain in right toe(s): Secondary | ICD-10-CM | POA: Diagnosis not present

## 2018-05-27 DIAGNOSIS — M79675 Pain in left toe(s): Secondary | ICD-10-CM | POA: Diagnosis not present

## 2018-05-27 DIAGNOSIS — E0821 Diabetes mellitus due to underlying condition with diabetic nephropathy: Secondary | ICD-10-CM | POA: Diagnosis not present

## 2018-05-27 NOTE — Progress Notes (Signed)
Complaint:  Visit Type: Patient returns to my office for continued preventative foot care services. Complaint: Patient states" my nails have grown long and thick and become painful to walk and wear shoes" Patient has been diagnosed with DM with neuropathy. The patient presents for preventative foot care services. No changes to ROS.  Patient has painful callus right foot   Podiatric Exam: Vascular: dorsalis pedis and posterior tibial pulses are palpable bilateral. Capillary return is immediate. Temperature gradient is WNL. Skin turgor WNL  Sensorium: Normal Semmes Weinstein monofilament test. Normal tactile sensation bilaterally. Nail Exam: Pt has thick disfigured discolored nails with subungual debris noted bilateral entire nail hallux through fifth toenails Ulcer Exam: There is no evidence of ulcer or pre-ulcerative changes or infection. Orthopedic Exam: Muscle tone and strength are WNL. No limitations in general ROM. No crepitus or effusions noted. Foot type and digits show no abnormalities.HAV  B/L. Skin:  Porokeratosis  sub 3 right.  No infection or ulcers.    Diagnosis:  Onychomycosis, , Pain in right toe, pain in left toes Porokeratosis   Right.  Dry scaly skin  Treatment & Plan Procedures and Treatment: Consent by patient was obtained for treatment procedures.   Debridement of mycotic and hypertrophic toenails, 1 through 5 bilateral and clearing of subungual debris. No ulceration, no infection noted. Debridement of porokeratosis  right Return Visit-Office Procedure: Patient instructed to return to the office for a follow up visit 9 weeks  for continued evaluation and treatment.    Gardiner Barefoot DPM

## 2018-08-05 ENCOUNTER — Ambulatory Visit (INDEPENDENT_AMBULATORY_CARE_PROVIDER_SITE_OTHER): Payer: BC Managed Care – PPO | Admitting: Podiatry

## 2018-08-05 ENCOUNTER — Other Ambulatory Visit: Payer: Self-pay

## 2018-08-05 ENCOUNTER — Encounter: Payer: Self-pay | Admitting: Podiatry

## 2018-08-05 DIAGNOSIS — M79674 Pain in right toe(s): Secondary | ICD-10-CM

## 2018-08-05 DIAGNOSIS — Q828 Other specified congenital malformations of skin: Secondary | ICD-10-CM | POA: Diagnosis not present

## 2018-08-05 DIAGNOSIS — E1142 Type 2 diabetes mellitus with diabetic polyneuropathy: Secondary | ICD-10-CM | POA: Diagnosis not present

## 2018-08-05 DIAGNOSIS — B351 Tinea unguium: Secondary | ICD-10-CM | POA: Diagnosis not present

## 2018-08-05 DIAGNOSIS — M79675 Pain in left toe(s): Secondary | ICD-10-CM | POA: Diagnosis not present

## 2018-08-05 NOTE — Progress Notes (Signed)
Complaint:  Visit Type: Patient returns to my office for continued preventative foot care services. Complaint: Patient states" my nails have grown long and thick and become painful to walk and wear shoes" Patient has been diagnosed with DM with neuropathy. The patient presents for preventative foot care services. No changes to ROS.  Patient has painful callus right foot   Podiatric Exam: Vascular: dorsalis pedis and posterior tibial pulses are palpable bilateral. Capillary return is immediate. Temperature gradient is WNL. Skin turgor WNL  Sensorium: Normal Semmes Weinstein monofilament test. Normal tactile sensation bilaterally. Nail Exam: Pt has thick disfigured discolored nails with subungual debris noted bilateral entire nail hallux through fifth toenails Ulcer Exam: There is no evidence of ulcer or pre-ulcerative changes or infection. Orthopedic Exam: Muscle tone and strength are WNL. No limitations in general ROM. No crepitus or effusions noted. Foot type and digits show no abnormalities.HAV  B/L. Skin:  Porokeratosis  sub 3 right.  No infection or ulcers.    Diagnosis:  Onychomycosis, , Pain in right toe, pain in left toes Porokeratosis   Right.  Dry scaly skin B/L.  Treatment & Plan Procedures and Treatment: Consent by patient was obtained for treatment procedures.   Debridement of mycotic and hypertrophic toenails, 1 through 5 bilateral and clearing of subungual debris. No ulceration, no infection noted. Debridement of porokeratosis  Right.  Padding dispensed. Return Visit-Office Procedure: Patient instructed to return to the office for a follow up visit 9 weeks  for continued evaluation and treatment.    Gardiner Barefoot DPM

## 2018-10-07 ENCOUNTER — Other Ambulatory Visit: Payer: Self-pay

## 2018-10-07 ENCOUNTER — Ambulatory Visit: Payer: BC Managed Care – PPO | Admitting: Podiatry

## 2018-10-07 ENCOUNTER — Encounter: Payer: Self-pay | Admitting: Podiatry

## 2018-10-07 DIAGNOSIS — E1142 Type 2 diabetes mellitus with diabetic polyneuropathy: Secondary | ICD-10-CM | POA: Diagnosis not present

## 2018-10-07 DIAGNOSIS — M79675 Pain in left toe(s): Secondary | ICD-10-CM | POA: Diagnosis not present

## 2018-10-07 DIAGNOSIS — M79674 Pain in right toe(s): Secondary | ICD-10-CM

## 2018-10-07 DIAGNOSIS — B351 Tinea unguium: Secondary | ICD-10-CM

## 2018-10-07 DIAGNOSIS — Q828 Other specified congenital malformations of skin: Secondary | ICD-10-CM | POA: Diagnosis not present

## 2018-10-07 NOTE — Progress Notes (Signed)
Complaint:  Visit Type: Patient returns to my office for continued preventative foot care services. Complaint: Patient states" my nails have grown long and thick and become painful to walk and wear shoes" Patient has been diagnosed with DM with neuropathy. The patient presents for preventative foot care services. No changes to ROS.  Patient has painful callus both feet.   Podiatric Exam: Vascular: dorsalis pedis and posterior tibial pulses are palpable bilateral. Capillary return is immediate. Temperature gradient is WNL. Skin turgor WNL  Sensorium: Normal Semmes Weinstein monofilament test. Normal tactile sensation bilaterally. Nail Exam: Pt has thick disfigured discolored nails with subungual debris noted bilateral entire nail hallux through fifth toenails Ulcer Exam: There is no evidence of ulcer or pre-ulcerative changes or infection. Orthopedic Exam: Muscle tone and strength are WNL. No limitations in general ROM. No crepitus or effusions noted. Foot type and digits show no abnormalities.HAV  B/L. Skin:  Porokeratosis  sub 2  Right.and left.  No infection or ulcers.    Diagnosis:  Onychomycosis, , Pain in right toe, pain in left toes Porokeratosis   B/L..   Treatment & Plan Procedures and Treatment: Consent by patient was obtained for treatment procedures.   Debridement of mycotic and hypertrophic toenails, 1 through 5 bilateral and clearing of subungual debris. No ulceration, no infection noted. Debridement of porokeratosis  B/L. Return Visit-Office Procedure: Patient instructed to return to the office for a follow up visit 9 weeks  for continued evaluation and treatment.    Gardiner Barefoot DPM

## 2018-12-09 ENCOUNTER — Ambulatory Visit: Payer: BC Managed Care – PPO | Admitting: Podiatry

## 2018-12-09 ENCOUNTER — Encounter: Payer: Self-pay | Admitting: Podiatry

## 2018-12-09 ENCOUNTER — Other Ambulatory Visit: Payer: Self-pay

## 2018-12-09 VITALS — Temp 98.1°F

## 2018-12-09 DIAGNOSIS — Q828 Other specified congenital malformations of skin: Secondary | ICD-10-CM

## 2018-12-09 DIAGNOSIS — M79675 Pain in left toe(s): Secondary | ICD-10-CM | POA: Diagnosis not present

## 2018-12-09 DIAGNOSIS — M79674 Pain in right toe(s): Secondary | ICD-10-CM | POA: Diagnosis not present

## 2018-12-09 DIAGNOSIS — E1142 Type 2 diabetes mellitus with diabetic polyneuropathy: Secondary | ICD-10-CM

## 2018-12-09 DIAGNOSIS — B351 Tinea unguium: Secondary | ICD-10-CM

## 2018-12-09 NOTE — Progress Notes (Signed)
Complaint:  Visit Type: Patient returns to my office for continued preventative foot care services. Complaint: Patient states" my nails have grown long and thick and become painful to walk and wear shoes" Patient has been diagnosed with DM with neuropathy. The patient presents for preventative foot care services. No changes to ROS.  Patient has painful callus both feet.   Podiatric Exam: Vascular: dorsalis pedis and posterior tibial pulses are palpable bilateral. Capillary return is immediate. Temperature gradient is WNL. Skin turgor WNL  Sensorium: Normal Semmes Weinstein monofilament test. Normal tactile sensation bilaterally. Nail Exam: Pt has thick disfigured discolored nails with subungual debris noted bilateral entire nail hallux through fifth toenails Ulcer Exam: There is no evidence of ulcer or pre-ulcerative changes or infection. Orthopedic Exam: Muscle tone and strength are WNL. No limitations in general ROM. No crepitus or effusions noted. Foot type and digits show no abnormalities.HAV  B/L. Skin:  Porokeratosis  Sub 2 left and sub 3 right.  No infection or ulcers.    Diagnosis:  Onychomycosis, , Pain in right toe, pain in left toes Porokeratosis   B/L..   Treatment & Plan Procedures and Treatment: Consent by patient was obtained for treatment procedures.   Debridement of mycotic and hypertrophic toenails, 1 through 5 bilateral and clearing of subungual debris. No ulceration, no infection noted. Debridement of porokeratosis  B/L. Return Visit-Office Procedure: Patient instructed to return to the office for a follow up visit 9 weeks  for continued evaluation and treatment.    Gardiner Barefoot DPM

## 2019-03-07 ENCOUNTER — Other Ambulatory Visit: Payer: Self-pay

## 2019-03-07 ENCOUNTER — Ambulatory Visit (INDEPENDENT_AMBULATORY_CARE_PROVIDER_SITE_OTHER): Payer: BC Managed Care – PPO | Admitting: Podiatry

## 2019-03-07 ENCOUNTER — Encounter: Payer: Self-pay | Admitting: Podiatry

## 2019-03-07 DIAGNOSIS — M79675 Pain in left toe(s): Secondary | ICD-10-CM

## 2019-03-07 DIAGNOSIS — M79674 Pain in right toe(s): Secondary | ICD-10-CM | POA: Diagnosis not present

## 2019-03-07 DIAGNOSIS — E1142 Type 2 diabetes mellitus with diabetic polyneuropathy: Secondary | ICD-10-CM | POA: Diagnosis not present

## 2019-03-07 DIAGNOSIS — B351 Tinea unguium: Secondary | ICD-10-CM

## 2019-03-07 DIAGNOSIS — Q828 Other specified congenital malformations of skin: Secondary | ICD-10-CM | POA: Diagnosis not present

## 2019-03-07 NOTE — Progress Notes (Signed)
Complaint:  Visit Type: Patient returns to my office for continued preventative foot care services. Complaint: Patient states" my nails have grown long and thick and become painful to walk and wear shoes" Patient has been diagnosed with DM with neuropathy. The patient presents for preventative foot care services. No changes to ROS.  Patient has painful callus both feet.   Podiatric Exam: Vascular: dorsalis pedis and posterior tibial pulses are palpable bilateral. Capillary return is immediate. Temperature gradient is WNL. Skin turgor WNL  Sensorium: Normal Semmes Weinstein monofilament test. Normal tactile sensation bilaterally. Nail Exam: Pt has thick disfigured discolored nails with subungual debris noted bilateral entire nail hallux through fifth toenails Ulcer Exam: There is no evidence of ulcer or pre-ulcerative changes or infection. Orthopedic Exam: Muscle tone and strength are WNL. No limitations in general ROM. No crepitus or effusions noted. Foot type and digits show no abnormalities.HAV  B/L. Skin:  Porokeratosis  Sub 2 left and sub 3 right.  No infection or ulcers.    Diagnosis:  Onychomycosis, , Pain in right toe, pain in left toes Porokeratosis   B/L..   Treatment & Plan Procedures and Treatment: Consent by patient was obtained for treatment procedures.   Debridement of mycotic and hypertrophic toenails, 1 through 5 bilateral and clearing of subungual debris. No ulceration, no infection noted. Debridement of porokeratosis  B/L. Return Visit-Office Procedure: Patient instructed to return to the office for a follow up visit 9 weeks  for continued evaluation and treatment.    Jameil Whitmoyer DPM 

## 2019-04-19 ENCOUNTER — Ambulatory Visit: Payer: BC Managed Care – PPO | Admitting: Internal Medicine

## 2019-04-19 ENCOUNTER — Other Ambulatory Visit: Payer: Self-pay

## 2019-04-19 ENCOUNTER — Encounter: Payer: Self-pay | Admitting: Internal Medicine

## 2019-04-19 VITALS — BP 160/70 | HR 79 | Temp 97.6°F | Wt 161.6 lb

## 2019-04-19 DIAGNOSIS — C61 Malignant neoplasm of prostate: Secondary | ICD-10-CM | POA: Diagnosis not present

## 2019-04-19 DIAGNOSIS — I10 Essential (primary) hypertension: Secondary | ICD-10-CM

## 2019-04-19 DIAGNOSIS — E7800 Pure hypercholesterolemia, unspecified: Secondary | ICD-10-CM

## 2019-04-19 DIAGNOSIS — Z992 Dependence on renal dialysis: Secondary | ICD-10-CM | POA: Diagnosis not present

## 2019-04-19 DIAGNOSIS — N186 End stage renal disease: Secondary | ICD-10-CM

## 2019-04-19 DIAGNOSIS — E1122 Type 2 diabetes mellitus with diabetic chronic kidney disease: Secondary | ICD-10-CM | POA: Diagnosis not present

## 2019-04-19 LAB — POCT GLYCOSYLATED HEMOGLOBIN (HGB A1C): Hemoglobin A1C: 6.2 % — AB (ref 4.0–5.6)

## 2019-04-19 NOTE — Progress Notes (Signed)
Established Patient Office Visit     This visit occurred during the [name] public health emergency.  Safety protocols were in place, including screening questions prior to the visit, additional usage of staff PPE, and extensive cleaning of exam room while observing appropriate contact time as indicated for disinfecting solutions.    CC/Reason for Visit: Establish care, discuss chronic medical conditions  HPI: [name] [name] is a 68 y.o. male who is coming in today for the above mentioned reasons. Past Medical History is significant for: End-stage renal disease now on peritoneal dialysis followed by Dr. Joelyn [name], he is on the transplant list.  He also has a history of suboptimally controlled hypertension also managed by nephrology, well-controlled type 2 diabetes not on medications, hyperlipidemia not on medications, prostate cancer status post TURP followed by Dr. Jeffie [name].  He has no acute complaints today, is in good spirits.  He is here to establish care after his PCP retired.  He works as a Sports coach for [name].  He has a wife and 3 daughters and 7 grandchildren all are local, he has no known drug allergies, his family history significant for a father with diabetes and hypertension, mother with a CVA and a brother with prostate cancer.   Past Medical/Surgical History: Past Medical History:  Diagnosis Date  . Anemia    as a younger man  . Arthritis    hands  . Chronic kidney disease    "starting to bother me now" (07/28/2012)  . Diabetes mellitus     type II  . Diarrhea, functional   . ED (erectile dysfunction)   . Hypertension   . Neuropathy   . Peripheral neuropathy     Past Surgical History:  Procedure Laterality Date  . APPENDECTOMY  1950's  . AV FISTULA PLACEMENT Right 05/14/2016   Procedure: ARTERIOVENOUS (AV) FISTULA CREATION- RIGHT ARM;  Surgeon: [name] Mitchell, MD;  Location: [facility];  Service: Vascular;  Laterality: Right;  . COLONOSCOPY       Social History:  reports that he quit smoking about 33 years ago. His smoking use included cigarettes. He has a 1.20 pack-year smoking history. He has never used smokeless tobacco. He reports current alcohol use. He reports that he does not use drugs.  Allergies: Allergies  Allergen Reactions  . Other Rash    Oxyquinoline-white Pet-lanolin  . No Known Allergies     Family History:  Family History  Problem Relation Age of Onset  . Colon polyps Mother   . Stroke Mother   . Diabetes Father   . Diabetes Cousin      Current Outpatient Medications:  .  aspirin 81 MG tablet, Take 1 tablet (81 mg total) by mouth daily., Disp: 30 tablet, Rfl:  .  calcitRIOL (ROCALTROL) 0.5 MCG capsule, Take by mouth., Disp: , Rfl:  .  telmisartan (MICARDIS) 20 MG tablet, Take 20 mg by mouth daily., Disp: , Rfl:  .  torsemide (DEMADEX) 100 MG tablet, TAKE 1 TABLET BY MOUTH EVERY DAY IN THE MORNING, Disp: , Rfl: 11 .  UNABLE TO FIND, TAKE 1 TABLET BY MOUTH EVERY DAY (ON DIALYSIS DAYS, TAKE AFTER DIALYSIS TREATMENT), Disp: , Rfl:   Review of Systems:  Constitutional: Denies fever, chills, diaphoresis, appetite change and fatigue.  HEENT: Denies photophobia, eye pain, redness, hearing loss, ear pain, congestion, sore throat, rhinorrhea, sneezing, mouth sores, trouble swallowing, neck pain, neck stiffness and tinnitus.   Respiratory: Denies SOB, DOE, cough, chest tightness,  and wheezing.   Cardiovascular: Denies chest pain, palpitations and leg swelling.  Gastrointestinal: Denies nausea, vomiting, abdominal pain, diarrhea, constipation, blood in stool and abdominal distention.  Genitourinary: Denies dysuria, urgency, frequency, hematuria, flank pain and difficulty urinating.  Endocrine: Denies: hot or cold intolerance, sweats, changes in hair or nails, polyuria, polydipsia. Musculoskeletal: Denies myalgias, back pain, joint swelling, arthralgias and gait problem.  Skin: Denies pallor, rash and wound.   Neurological: Denies dizziness, seizures, syncope, weakness, light-headedness, numbness and headaches.  Hematological: Denies adenopathy. Easy bruising, personal or family bleeding history  Psychiatric/Behavioral: Denies suicidal ideation, mood changes, confusion, nervousness, sleep disturbance and agitation    Physical Exam: Vitals:   04/19/19 1013  BP: (!) 160/70  Pulse: 79  Temp: 97.6 F (36.4 C)  TempSrc: Temporal  SpO2: 100%  Weight: 161 lb 9.6 oz (73.3 kg)    Body mass index is 23.19 kg/m.   Constitutional: NAD, calm, comfortable Eyes: PERRL, lids and conjunctivae normal, wears corrective lenses ENMT: Mucous membranes are moist.  Respiratory: clear to auscultation bilaterally, no wheezing, no crackles. Normal respiratory effort. No accessory muscle use.  Cardiovascular: Regular rate and rhythm, no murmurs / rubs / gallops. No extremity edema. 2+ pedal pulses.  Abdomen: no tenderness, no masses palpated. No hepatosplenomegaly. Bowel sounds positive.  Musculoskeletal: no clubbing / cyanosis. No joint deformity upper and lower extremities. Good ROM, no contractures. Normal muscle tone.  Skin: no rashes, lesions, ulcers. No induration Neurologic: Grossly intact and nonfocal Psychiatric: Normal judgment and insight. Alert and oriented x 3. Normal mood.    Impression and Plan:  ESRD (end stage renal disease) on dialysis [name]) Peritoneal dialysis catheter in situ ([name] [name]) -Not nightly peritoneal dialysis. -His nephrologist is Dr. Joelyn [name].  Prostate cancer [name]) -Status post TURP, followed by Dr. Tobe [name]  Hypercholesteremia -Last LDL was 154 in 2017, recheck when he returns for physical.  Essential hypertension -Uncontrolled in office today, managed by nephrology since he is on dialysis.  Type 2 diabetes mellitus with chronic kidney disease on chronic dialysis, without long-term current use of insulin ([name])  -A1c today is 6.2 which demonstrates good control off  medications.   Patient Instructions  -Nice seeing you today!!  -Schedule follow up visit for your annual physical in 3 months. Please come in fasting that day.     [name] Frohlich, MD [facility]

## 2019-04-19 NOTE — Patient Instructions (Signed)
-  Nice seeing you today!!  -Schedule follow up visit for your annual physical in 3 months. Please come in fasting that day.

## 2019-05-09 ENCOUNTER — Ambulatory Visit: Payer: BC Managed Care – PPO | Admitting: Podiatry

## 2019-06-07 ENCOUNTER — Ambulatory Visit: Payer: BC Managed Care – PPO | Attending: Internal Medicine

## 2019-06-07 DIAGNOSIS — Z20822 Contact with and (suspected) exposure to covid-19: Secondary | ICD-10-CM

## 2019-06-08 LAB — NOVEL CORONAVIRUS, NAA: SARS-CoV-2, NAA: DETECTED — AB

## 2019-06-09 ENCOUNTER — Other Ambulatory Visit: Payer: Self-pay | Admitting: Nurse Practitioner

## 2019-06-09 DIAGNOSIS — Z992 Dependence on renal dialysis: Secondary | ICD-10-CM

## 2019-06-09 DIAGNOSIS — U071 COVID-19: Secondary | ICD-10-CM

## 2019-06-09 DIAGNOSIS — N186 End stage renal disease: Secondary | ICD-10-CM

## 2019-06-09 NOTE — Progress Notes (Signed)
  I connected by phone with [name] [name] on 06/09/2019 at 8:20 AM to discuss the potential use of an new treatment for mild to moderate COVID-19 viral infection in non-hospitalized patients.  This patient is a 69 y.o. male that meets the FDA criteria for Emergency Use Authorization of bamlanivimab or casirivimab\imdevimab.  Has a (+) direct SARS-CoV-2 viral test result  Has mild or moderate COVID-19   Is ? 69 years of age and weighs ? 40 kg  Is NOT hospitalized due to COVID-19  Is NOT requiring oxygen therapy or requiring an increase in baseline oxygen flow rate due to COVID-19  Is within 10 days of symptom onset  Has at least one of the high risk factor(s) for progression to severe COVID-19 and/or hospitalization as defined in EUA.  Specific high risk criteria : Chronic Kidney Disease (CKD)   I have spoken and communicated the following to the patient or parent/caregiver:  1. FDA has authorized the emergency use of bamlanivimab and casirivimab\imdevimab for the treatment of mild to moderate COVID-19 in adults and pediatric patients with positive results of direct SARS-CoV-2 viral testing who are 9 years of age and older weighing at least 40 kg, and who are at high risk for progressing to severe COVID-19 and/or hospitalization.  2. The significant known and potential risks and benefits of bamlanivimab and casirivimab\imdevimab, and the extent to which such potential risks and benefits are unknown.  3. Information on available alternative treatments and the risks and benefits of those alternatives, including clinical trials.  4. Patients treated with bamlanivimab and casirivimab\imdevimab should continue to self-isolate and use infection control measures (e.g., wear mask, isolate, social distance, avoid sharing personal items, clean and disinfect "high touch" surfaces, and frequent handwashing) according to CDC guidelines.   5. The patient or parent/caregiver has the option to  accept or refuse bamlanivimab or casirivimab\imdevimab .  After reviewing this information with the patient, The patient agreed to proceed with receiving the bamlanimivab infusion and will be provided a copy of the Fact sheet prior to receiving the infusion.[name] 06/09/2019 8:20 AM

## 2019-06-10 ENCOUNTER — Encounter (HOSPITAL_COMMUNITY): Payer: Self-pay | Admitting: Emergency Medicine

## 2019-06-10 ENCOUNTER — Emergency Department (HOSPITAL_COMMUNITY): Payer: Medicare Other

## 2019-06-10 ENCOUNTER — Other Ambulatory Visit: Payer: Self-pay

## 2019-06-10 ENCOUNTER — Inpatient Hospital Stay (HOSPITAL_COMMUNITY)
Admission: EM | Admit: 2019-06-10 | Discharge: 2019-06-12 | DRG: 177 | Disposition: A | Discharge status: Home / Self Care | Payer: Medicare Other | Attending: Internal Medicine | Admitting: Internal Medicine

## 2019-06-10 DIAGNOSIS — D631 Anemia in chronic kidney disease: Secondary | ICD-10-CM | POA: Diagnosis present

## 2019-06-10 DIAGNOSIS — Z79899 Other long term (current) drug therapy: Secondary | ICD-10-CM | POA: Diagnosis not present

## 2019-06-10 DIAGNOSIS — E861 Hypovolemia: Secondary | ICD-10-CM | POA: Diagnosis present

## 2019-06-10 DIAGNOSIS — Z87891 Personal history of nicotine dependence: Secondary | ICD-10-CM

## 2019-06-10 DIAGNOSIS — R296 Repeated falls: Secondary | ICD-10-CM | POA: Diagnosis present

## 2019-06-10 DIAGNOSIS — J1282 Pneumonia due to coronavirus disease 2019: Secondary | ICD-10-CM | POA: Diagnosis present

## 2019-06-10 DIAGNOSIS — E118 Type 2 diabetes mellitus with unspecified complications: Secondary | ICD-10-CM | POA: Diagnosis not present

## 2019-06-10 DIAGNOSIS — Z8546 Personal history of malignant neoplasm of prostate: Secondary | ICD-10-CM

## 2019-06-10 DIAGNOSIS — R197 Diarrhea, unspecified: Secondary | ICD-10-CM | POA: Diagnosis present

## 2019-06-10 DIAGNOSIS — W19XXXA Unspecified fall, initial encounter: Secondary | ICD-10-CM

## 2019-06-10 DIAGNOSIS — I248 Other forms of acute ischemic heart disease: Secondary | ICD-10-CM | POA: Diagnosis present

## 2019-06-10 DIAGNOSIS — Z823 Family history of stroke: Secondary | ICD-10-CM | POA: Diagnosis not present

## 2019-06-10 DIAGNOSIS — M1990 Unspecified osteoarthritis, unspecified site: Secondary | ICD-10-CM | POA: Diagnosis present

## 2019-06-10 DIAGNOSIS — Z833 Family history of diabetes mellitus: Secondary | ICD-10-CM | POA: Diagnosis not present

## 2019-06-10 DIAGNOSIS — E559 Vitamin D deficiency, unspecified: Secondary | ICD-10-CM | POA: Diagnosis present

## 2019-06-10 DIAGNOSIS — N186 End stage renal disease: Secondary | ICD-10-CM

## 2019-06-10 DIAGNOSIS — R569 Unspecified convulsions: Secondary | ICD-10-CM | POA: Diagnosis not present

## 2019-06-10 DIAGNOSIS — Z992 Dependence on renal dialysis: Secondary | ICD-10-CM | POA: Diagnosis not present

## 2019-06-10 DIAGNOSIS — Y92009 Unspecified place in unspecified non-institutional (private) residence as the place of occurrence of the external cause: Secondary | ICD-10-CM | POA: Diagnosis not present

## 2019-06-10 DIAGNOSIS — E876 Hypokalemia: Secondary | ICD-10-CM | POA: Diagnosis present

## 2019-06-10 DIAGNOSIS — R778 Other specified abnormalities of plasma proteins: Secondary | ICD-10-CM | POA: Diagnosis not present

## 2019-06-10 DIAGNOSIS — R0902 Hypoxemia: Secondary | ICD-10-CM

## 2019-06-10 DIAGNOSIS — R55 Syncope and collapse: Secondary | ICD-10-CM | POA: Diagnosis present

## 2019-06-10 DIAGNOSIS — D638 Anemia in other chronic diseases classified elsewhere: Secondary | ICD-10-CM | POA: Diagnosis not present

## 2019-06-10 DIAGNOSIS — Z8371 Family history of colonic polyps: Secondary | ICD-10-CM | POA: Diagnosis not present

## 2019-06-10 DIAGNOSIS — E1142 Type 2 diabetes mellitus with diabetic polyneuropathy: Secondary | ICD-10-CM | POA: Diagnosis present

## 2019-06-10 DIAGNOSIS — E1122 Type 2 diabetes mellitus with diabetic chronic kidney disease: Secondary | ICD-10-CM | POA: Diagnosis present

## 2019-06-10 DIAGNOSIS — I120 Hypertensive chronic kidney disease with stage 5 chronic kidney disease or end stage renal disease: Secondary | ICD-10-CM | POA: Diagnosis present

## 2019-06-10 DIAGNOSIS — Z7982 Long term (current) use of aspirin: Secondary | ICD-10-CM

## 2019-06-10 DIAGNOSIS — U071 COVID-19: Principal | ICD-10-CM | POA: Diagnosis present

## 2019-06-10 DIAGNOSIS — R7989 Other specified abnormal findings of blood chemistry: Secondary | ICD-10-CM | POA: Diagnosis present

## 2019-06-10 DIAGNOSIS — E119 Type 2 diabetes mellitus without complications: Secondary | ICD-10-CM

## 2019-06-10 DIAGNOSIS — K920 Hematemesis: Secondary | ICD-10-CM | POA: Diagnosis not present

## 2019-06-10 DIAGNOSIS — E860 Dehydration: Secondary | ICD-10-CM

## 2019-06-10 LAB — CBC WITH DIFFERENTIAL/PLATELET
Abs Immature Granulocytes: 0.06 10*3/uL (ref 0.00–0.07)
Abs Immature Granulocytes: 0.04 10*3/uL (ref 0.00–0.07)
Basophils Absolute: 0 10*3/uL (ref 0.0–0.1)
Basophils Absolute: 0 10*3/uL (ref 0.0–0.1)
Basophils Relative: 0 %
Basophils Relative: 0 %
Eosinophils Absolute: 0 10*3/uL (ref 0.0–0.5)
Eosinophils Absolute: 0 10*3/uL (ref 0.0–0.5)
Eosinophils Relative: 0 %
Eosinophils Relative: 0 %
HCT: 37.1 % — ABNORMAL LOW (ref 39.0–52.0)
HCT: 32.8 % — ABNORMAL LOW (ref 39.0–52.0)
Hemoglobin: 11.7 g/dL — ABNORMAL LOW (ref 13.0–17.0)
Hemoglobin: 10.7 g/dL — ABNORMAL LOW (ref 13.0–17.0)
Immature Granulocytes: 1 %
Immature Granulocytes: 1 %
Lymphocytes Relative: 16 %
Lymphocytes Relative: 20 %
Lymphs Abs: 1.5 10*3/uL (ref 0.7–4.0)
Lymphs Abs: 1.5 10*3/uL (ref 0.7–4.0)
MCH: 29.2 pg (ref 26.0–34.0)
MCH: 29.5 pg (ref 26.0–34.0)
MCHC: 31.5 g/dL (ref 30.0–36.0)
MCHC: 32.6 g/dL (ref 30.0–36.0)
MCV: 92.5 fL (ref 80.0–100.0)
MCV: 90.4 fL (ref 80.0–100.0)
Monocytes Absolute: 1.6 10*3/uL — ABNORMAL HIGH (ref 0.1–1.0)
Monocytes Absolute: 1.3 10*3/uL — ABNORMAL HIGH (ref 0.1–1.0)
Monocytes Relative: 18 %
Monocytes Relative: 16 %
Neutro Abs: 6.1 10*3/uL (ref 1.7–7.7)
Neutro Abs: 4.9 10*3/uL (ref 1.7–7.7)
Neutrophils Relative %: 65 %
Neutrophils Relative %: 63 %
Platelets: 237 10*3/uL (ref 150–400)
Platelets: 211 10*3/uL (ref 150–400)
RBC: 4.01 MIL/uL — ABNORMAL LOW (ref 4.22–5.81)
RBC: 3.63 MIL/uL — ABNORMAL LOW (ref 4.22–5.81)
RDW: 12.2 % (ref 11.5–15.5)
RDW: 12.4 % (ref 11.5–15.5)
WBC: 9.3 10*3/uL (ref 4.0–10.5)
WBC: 7.8 10*3/uL (ref 4.0–10.5)
nRBC: 0 % (ref 0.0–0.2)
nRBC: 0 % (ref 0.0–0.2)

## 2019-06-10 LAB — BODY FLUID CELL COUNT WITH DIFFERENTIAL
Eos, Fluid: 0 %
Lymphs, Fluid: 21 %
Monocyte-Macrophage-Serous Fluid: 77 % (ref 50–90)
Neutrophil Count, Fluid: 2 % (ref 0–25)
Total Nucleated Cell Count, Fluid: 175 cu mm (ref 0–1000)

## 2019-06-10 LAB — COMPREHENSIVE METABOLIC PANEL
ALT: 13 U/L (ref 0–44)
AST: 15 U/L (ref 15–41)
Albumin: 2.8 g/dL — ABNORMAL LOW (ref 3.5–5.0)
Alkaline Phosphatase: 41 U/L (ref 38–126)
Anion gap: 15 (ref 5–15)
BUN: 45 mg/dL — ABNORMAL HIGH (ref 8–23)
CO2: 24 mmol/L (ref 22–32)
Calcium: 8.4 mg/dL — ABNORMAL LOW (ref 8.9–10.3)
Chloride: 96 mmol/L — ABNORMAL LOW (ref 98–111)
Creatinine, Ser: 11.8 mg/dL — ABNORMAL HIGH (ref 0.61–1.24)
GFR calc Af Amer: 4 mL/min — ABNORMAL LOW (ref >60)
GFR calc non Af Amer: 4 mL/min — ABNORMAL LOW (ref >60)
Glucose, Bld: 140 mg/dL — ABNORMAL HIGH (ref 70–99)
Potassium: 3.2 mmol/L — ABNORMAL LOW (ref 3.5–5.1)
Sodium: 135 mmol/L (ref 135–145)
Total Bilirubin: 0.7 mg/dL (ref 0.3–1.2)
Total Protein: 7.3 g/dL (ref 6.5–8.1)

## 2019-06-10 LAB — LACTIC ACID, PLASMA
Lactic Acid, Venous: 0.7 mmol/L (ref 0.5–1.9)
Lactic Acid, Venous: 0.7 mmol/L (ref 0.5–1.9)

## 2019-06-10 LAB — TYPE AND SCREEN
ABO/RH(D): AB POS
Antibody Screen: NEGATIVE

## 2019-06-10 LAB — C-REACTIVE PROTEIN: CRP: 8.4 mg/dL — ABNORMAL HIGH (ref <1.0)

## 2019-06-10 LAB — GLUCOSE, CAPILLARY
Glucose-Capillary: 87 mg/dL (ref 70–99)
Glucose-Capillary: 124 mg/dL — ABNORMAL HIGH (ref 70–99)
Glucose-Capillary: 112 mg/dL — ABNORMAL HIGH (ref 70–99)
Glucose-Capillary: 248 mg/dL — ABNORMAL HIGH (ref 70–99)

## 2019-06-10 LAB — FIBRINOGEN: Fibrinogen: 647 mg/dL — ABNORMAL HIGH (ref 210–475)

## 2019-06-10 LAB — PROCALCITONIN: Procalcitonin: 1.73 ng/mL

## 2019-06-10 LAB — TROPONIN I (HIGH SENSITIVITY)
Troponin I (High Sensitivity): 76 ng/L — ABNORMAL HIGH (ref <18)
Troponin I (High Sensitivity): 77 ng/L — ABNORMAL HIGH (ref <18)

## 2019-06-10 LAB — D-DIMER, QUANTITATIVE: D-Dimer, Quant: 0.62 ug/mL-FEU — ABNORMAL HIGH (ref 0.00–0.50)

## 2019-06-10 LAB — ABO/RH: ABO/RH(D): AB POS

## 2019-06-10 LAB — LACTATE DEHYDROGENASE: LDH: 123 U/L (ref 98–192)

## 2019-06-10 LAB — HEPATITIS B SURFACE ANTIGEN: Hepatitis B Surface Ag: NONREACTIVE

## 2019-06-10 LAB — BRAIN NATRIURETIC PEPTIDE: B Natriuretic Peptide: 45.5 pg/mL (ref 0.0–100.0)

## 2019-06-10 LAB — HIV ANTIBODY (ROUTINE TESTING W REFLEX): HIV Screen 4th Generation wRfx: NONREACTIVE

## 2019-06-10 LAB — FERRITIN: Ferritin: 1123 ng/mL — ABNORMAL HIGH (ref 24–336)

## 2019-06-10 LAB — TRIGLYCERIDES: Triglycerides: 223 mg/dL — ABNORMAL HIGH (ref <150)

## 2019-06-10 MED ORDER — HEPARIN SODIUM (PORCINE) 5000 UNIT/ML IJ SOLN
5000.0000 [IU] | Freq: Three times a day (TID) | INTRAMUSCULAR | Status: DC
  Administered 2019-06-10 – 2019-06-12 (×8): 5000 [IU] via SUBCUTANEOUS
  Filled 2019-06-10 (×7): qty 1

## 2019-06-10 MED ORDER — GENTAMICIN SULFATE 0.1 % EX CREA
1.0000 "application " | TOPICAL_CREAM | Freq: Every day | CUTANEOUS | Status: DC
  Administered 2019-06-10 – 2019-06-11 (×2): 1 via TOPICAL
  Filled 2019-06-10: qty 15

## 2019-06-10 MED ORDER — SODIUM CHLORIDE 0.9 % IV SOLN
100.0000 mg | Freq: Every day | INTRAVENOUS | Status: DC
  Administered 2019-06-11 – 2019-06-12 (×2): 100 mg via INTRAVENOUS
  Filled 2019-06-10 (×2): qty 20

## 2019-06-10 MED ORDER — METHYLPREDNISOLONE SODIUM SUCC 125 MG IJ SOLR: 125.0000 mg | Freq: Once | INTRAMUSCULAR | Status: DC | PRN

## 2019-06-10 MED ORDER — SODIUM CHLORIDE 0.9 % IV SOLN: INTRAVENOUS | Status: DC | PRN

## 2019-06-10 MED ORDER — HEPARIN 1000 UNIT/ML FOR PERITONEAL DIALYSIS
500.0000 [IU] | INTRAMUSCULAR | Status: DC | PRN
  Filled 2019-06-10: qty 0.5

## 2019-06-10 MED ORDER — POTASSIUM CHLORIDE CRYS ER 20 MEQ PO TBCR
40.0000 meq | EXTENDED_RELEASE_TABLET | ORAL | Status: AC
  Administered 2019-06-10: 40 meq via ORAL
  Filled 2019-06-10: qty 2

## 2019-06-10 MED ORDER — INSULIN ASPART 100 UNIT/ML ~~LOC~~ SOLN
0.0000 [IU] | Freq: Three times a day (TID) | SUBCUTANEOUS | Status: DC
  Administered 2019-06-10: 1 [IU] via SUBCUTANEOUS
  Administered 2019-06-11 (×2): 2 [IU] via SUBCUTANEOUS
  Administered 2019-06-12: 3 [IU] via SUBCUTANEOUS

## 2019-06-10 MED ORDER — EPINEPHRINE 0.3 MG/0.3ML IJ SOAJ: 0.3000 mg | Freq: Once | INTRAMUSCULAR | Status: DC | PRN

## 2019-06-10 MED ORDER — ONDANSETRON 4 MG PO TBDP
4.0000 mg | ORAL_TABLET | Freq: Once | ORAL | Status: AC
  Administered 2019-06-10: 4 mg via ORAL
  Filled 2019-06-10: qty 1

## 2019-06-10 MED ORDER — ONDANSETRON HCL 4 MG/2ML IJ SOLN: 4.0000 mg | Freq: Once | INTRAMUSCULAR | Status: DC

## 2019-06-10 MED ORDER — ALBUTEROL SULFATE HFA 108 (90 BASE) MCG/ACT IN AERS: 2.0000 | INHALATION_SPRAY | Freq: Once | RESPIRATORY_TRACT | Status: DC | PRN

## 2019-06-10 MED ORDER — ACETAMINOPHEN 325 MG PO TABS: 650.0000 mg | ORAL_TABLET | Freq: Four times a day (QID) | ORAL | Status: DC | PRN

## 2019-06-10 MED ORDER — LACTATED RINGERS IV BOLUS
1000.0000 mL | Freq: Once | INTRAVENOUS | Status: AC
  Administered 2019-06-10: 1000 mL via INTRAVENOUS

## 2019-06-10 MED ORDER — ALBUTEROL SULFATE HFA 108 (90 BASE) MCG/ACT IN AERS
2.0000 | INHALATION_SPRAY | RESPIRATORY_TRACT | Status: DC | PRN
  Filled 2019-06-10: qty 6.7

## 2019-06-10 MED ORDER — DIPHENHYDRAMINE HCL 50 MG/ML IJ SOLN: 50.0000 mg | Freq: Once | INTRAMUSCULAR | Status: DC | PRN

## 2019-06-10 MED ORDER — HYDROCOD POLST-CPM POLST ER 10-8 MG/5ML PO SUER: 5.0000 mL | Freq: Two times a day (BID) | ORAL | Status: DC | PRN

## 2019-06-10 MED ORDER — GUAIFENESIN-DM 100-10 MG/5ML PO SYRP: 10.0000 mL | ORAL_SOLUTION | ORAL | Status: DC | PRN

## 2019-06-10 MED ORDER — ALBUTEROL SULFATE HFA 108 (90 BASE) MCG/ACT IN AERS
2.0000 | INHALATION_SPRAY | Freq: Four times a day (QID) | RESPIRATORY_TRACT | Status: DC
  Administered 2019-06-10 (×2): 2 via RESPIRATORY_TRACT
  Filled 2019-06-10: qty 6.7

## 2019-06-10 MED ORDER — ONDANSETRON HCL 4 MG PO TABS: 4.0000 mg | ORAL_TABLET | Freq: Four times a day (QID) | ORAL | Status: DC | PRN

## 2019-06-10 MED ORDER — ZINC SULFATE 220 (50 ZN) MG PO CAPS
220.0000 mg | ORAL_CAPSULE | Freq: Every day | ORAL | Status: DC
  Administered 2019-06-10 – 2019-06-12 (×3): 220 mg via ORAL
  Filled 2019-06-10 (×3): qty 1

## 2019-06-10 MED ORDER — ACETAMINOPHEN 500 MG PO TABS
1000.0000 mg | ORAL_TABLET | Freq: Once | ORAL | Status: AC
  Administered 2019-06-10: 1000 mg via ORAL
  Filled 2019-06-10: qty 2

## 2019-06-10 MED ORDER — ACETAMINOPHEN 325 MG PO TABS
650.0000 mg | ORAL_TABLET | Freq: Once | ORAL | Status: AC
  Administered 2019-06-10: 650 mg via ORAL
  Filled 2019-06-10: qty 2

## 2019-06-10 MED ORDER — ASCORBIC ACID 500 MG PO TABS
500.0000 mg | ORAL_TABLET | Freq: Every day | ORAL | Status: DC
  Administered 2019-06-10 – 2019-06-12 (×3): 500 mg via ORAL
  Filled 2019-06-10 (×3): qty 1

## 2019-06-10 MED ORDER — SODIUM CHLORIDE 0.9 % IV SOLN: 700.0000 mg | Freq: Once | INTRAVENOUS | Status: DC

## 2019-06-10 MED ORDER — SODIUM CHLORIDE 0.9% FLUSH
3.0000 mL | Freq: Two times a day (BID) | INTRAVENOUS | Status: DC
  Administered 2019-06-10 – 2019-06-12 (×5): 3 mL via INTRAVENOUS

## 2019-06-10 MED ORDER — SODIUM CHLORIDE 0.9 % IV SOLN
200.0000 mg | Freq: Once | INTRAVENOUS | Status: AC
  Administered 2019-06-10: 14:00:00 200 mg via INTRAVENOUS
  Filled 2019-06-10: qty 200

## 2019-06-10 MED ORDER — DELFLEX-LC/1.5% DEXTROSE 344 MOSM/L IP SOLN
INTRAPERITONEAL | Status: DC
  Administered 2019-06-10: 5000 mL via INTRAPERITONEAL

## 2019-06-10 MED ORDER — CALCITRIOL 0.5 MCG PO CAPS
0.5000 ug | ORAL_CAPSULE | Freq: Every day | ORAL | Status: DC
  Administered 2019-06-10 – 2019-06-12 (×3): 0.5 ug via ORAL
  Filled 2019-06-10: qty 1
  Filled 2019-06-10: qty 2
  Filled 2019-06-10: qty 1
  Filled 2019-06-10 (×2): qty 2
  Filled 2019-06-10: qty 1

## 2019-06-10 MED ORDER — ONDANSETRON HCL 4 MG/2ML IJ SOLN: 4.0000 mg | Freq: Four times a day (QID) | INTRAMUSCULAR | Status: DC | PRN

## 2019-06-10 MED ORDER — FAMOTIDINE IN NACL 20-0.9 MG/50ML-% IV SOLN: 20.0000 mg | Freq: Once | INTRAVENOUS | Status: DC | PRN

## 2019-06-10 MED ORDER — DEXAMETHASONE 6 MG PO TABS
6.0000 mg | ORAL_TABLET | Freq: Every day | ORAL | Status: DC
  Administered 2019-06-10 – 2019-06-12 (×3): 6 mg via ORAL
  Filled 2019-06-10 (×3): qty 1

## 2019-06-10 NOTE — Plan of Care (Signed)
69 yo male with hypertension, Dm2, peripheral neuropathy, ESRD on PD, covid19 positive about 1week ago, has ataxia, and fever, and hypoxia on walking per ED.   + cough per wife.  Pt states has diarrhea (chronic).  Pt 's wife notes that he has not had any n/v, abd pain, brbpr.  Wife notes that patient has been compliant with dialysis.     Pt's wife states that he fell yesterday while going towards the bathroom, and then fell again. Yesterday pt hit his head on bathroom door per wife.  Pt was sent to ER for fever and ataxia.     Na 135, K 3.2, Bun 45, Creatinine 11.8  Ast 15, Alt 13,  Wbc 9.3, Hgb 11.7, Plt 237   CXR IMPRESSION: No active disease.  Covid-19 labs pending  Stool studies pending Urinalysis pending CT brain pending for Ataxia.   ED requesting w/up of fever, unclear source, ? covid, hypoxia with walking ? Covid,  and ataxia.

## 2019-06-10 NOTE — ED Notes (Signed)
Phenonda (wife) would like updates and can answer questions that pt cannot. [phone]

## 2019-06-10 NOTE — H&P (Signed)
History and Physical    [name] [name] YIF:[id number] DOB: 11-May-1951 DOA: 06/10/2019  Referring MD/NP/PA: Jani Gravel, MD PCP: [name], [name], MD  Patient coming from: Home via EMS  Chief Complaint: Fall  I have personally briefly reviewed patient's old medical records in [place]   HPI: [name] [name] is a 69 y.o. male with medical history significant of hypertension, ESRD on PD, DM type II with peripheral neuropathy, arthritis, anemia chronic disease, remote history of tobacco use, and functional diarrhea.  He presents after having 2 falls at home.  Thinks that he passed out with the falls and he has been significantly weak.  One of the falls he did hit his head but denies any injuries.  He was recently around his granddaughter who tested positive for COVID-19 and thereafter tested positive on 1/20.  He was scheduled to get an infusion of the antibodies tomorrow.  Since that time he reports that he has had a productive cough, poor appetite, intermittent fevers, chills, worsening diarrhea, and some weight loss.  Patient has diarrhea at baseline, but reports that anytime he has to use bathroom he has a bowel movement now.  Diarrhea is nonbloody in appearance.  His daughter makes note that the patient has had issues with dehydration in the past.  Denies any change in taste, but has been not wanting to eat or drink.  His wife called EMS last night when he fell she was unable to get him back up.  He is on daily peritoneal dialysis,  is on diuretic, and still reports making urine.  Denies having any chest pain, nausea, vomiting, shortness of breath, dysuria, headache, and muscle aches.     ED Course: Upon admission into the emergency department patient was noted to be 101.3 F, blood pressure 94/56-137/71, and O2 saturation maintained on room air while at rest. With ambulation patient's O2 saturations were noted to drop into the 80s.  Labs significant for WBC 9.3, hemoglobin  11.7, potassium 3.2, BUN 45, creatinine 11.8, glucose 140, and albumin 2.8.  Chest x-ray was clear.  CT scan of the brain was negative for any acute abnormality.  Patient was given Zofran, acetaminophen, and 1 L of lactated Ringer's.  TRH called to admit.  Review of Systems  Constitutional: Positive for fever and malaise/fatigue.  HENT: Negative for nosebleeds and sinus pain.   Eyes: Negative for pain and discharge.  Respiratory: Positive for cough and sputum production. Negative for shortness of breath.   Cardiovascular: Negative for chest pain and leg swelling.  Gastrointestinal: Positive for diarrhea. Negative for abdominal pain, blood in stool, nausea and vomiting.  Genitourinary: Negative for dysuria and hematuria.  Musculoskeletal: Negative for joint pain and myalgias.  Skin: Negative for rash.  Neurological: Positive for loss of consciousness and weakness. Negative for focal weakness and headaches.  Endo/Heme/Allergies: Negative for polydipsia. Does not bruise/bleed easily.  Psychiatric/Behavioral: Negative for substance abuse. The patient does not have insomnia.     Past Medical History:  Diagnosis Date  . Anemia    as a younger man  . Arthritis    hands  . Chronic kidney disease    "starting to bother me now" (07/28/2012)  . Diabetes mellitus     type II  . Diarrhea, functional   . ED (erectile dysfunction)   . Hypertension   . Neuropathy   . Peripheral neuropathy     Past Surgical History:  Procedure Laterality Date  . APPENDECTOMY  1950's  .  AV FISTULA PLACEMENT Right 05/14/2016   Procedure: ARTERIOVENOUS (AV) FISTULA CREATION- RIGHT ARM;  Surgeon: Serafina Mitchell, MD;  Location: [facility];  Service: Vascular;  Laterality: Right;  . COLONOSCOPY       reports that he quit smoking about 33 years ago. His smoking use included cigarettes. He has a 1.20 pack-year smoking history. He has never used smokeless tobacco. He reports current alcohol use. He reports that he does  not use drugs.  Allergies  Allergen Reactions  . Other Rash    Oxyquinoline-white Pet-lanolin  . No Known Allergies     Family History  Problem Relation Age of Onset  . Colon polyps Mother   . Stroke Mother   . Diabetes Father   . Diabetes Cousin     Prior to Admission medications   Medication Sig Start Date End Date Taking? Authorizing Provider  aspirin 81 MG tablet Take 1 tablet (81 mg total) by mouth daily. 08/09/14   Marletta Lor, MD  calcitRIOL (ROCALTROL) 0.5 MCG capsule Take by mouth. 12/09/17   [provider]  telmisartan (MICARDIS) 20 MG tablet Take 20 mg by mouth daily. 08/23/18   [provider]  torsemide (DEMADEX) 100 MG tablet TAKE 1 TABLET BY MOUTH EVERY DAY IN THE MORNING 01/10/18   [provider]  UNABLE TO FIND TAKE 1 TABLET BY MOUTH EVERY DAY (ON DIALYSIS DAYS, TAKE AFTER DIALYSIS TREATMENT) 12/24/17   [provider]    Physical Exam:  Constitutional: Elderly male who appears to be in no acute distress at this time and able to follow commands Vitals:   06/10/19 0530 06/10/19 0630 06/10/19 0645 06/10/19 0700  BP: (!) 121/56 117/61 95/60 (!) 94/56  Pulse: 71 65 85 79  Resp: 14 13 15 18   Temp:      TempSrc:      SpO2: 98% 96% 99% 100%   Eyes: PERRL, lids and conjunctivae normal ENMT: Mucous membranes are dry. Posterior pharynx clear of any exudate or lesions. Neck: normal, supple, no masses, no thyromegaly Respiratory: clear to auscultation bilaterally, no wheezing, no crackles. Normal respiratory effort. No accessory muscle use.  Cardiovascular: Regular rate and rhythm, no murmurs / rubs / gallops. No extremity edema. 2+ pedal pulses. Fistula with palpable thrill of the left upper extremity. Abdomen: no tenderness, no masses palpated. No hepatosplenomegaly. Bowel sounds positive.  Musculoskeletal: no clubbing / cyanosis. No joint deformity upper and lower extremities. Good ROM, no contractures. Normal muscle tone.    Skin: no rashes, lesions, ulcers. No induration Neurologic: CN 2-12 grossly intact. Sensation intact, DTR normal. Strength 5/5 in all 4.  Psychiatric: Normal judgment and insight. Alert and oriented x 3. Normal mood.     Labs on Admission: I have personally reviewed following labs and imaging studies  CBC: Recent Labs  Lab 06/10/19 0241  WBC 9.3  NEUTROABS 6.1  HGB 11.7*  HCT 37.1*  MCV 92.5  PLT 097   Basic Metabolic Panel: Recent Labs  Lab 06/10/19 0241  NA 135  K 3.2*  CL 96*  CO2 24  GLUCOSE 140*  BUN 45*  CREATININE 11.80*  CALCIUM 8.4*   GFR: CrCl cannot be calculated (Unknown ideal weight.). Liver Function Tests: Recent Labs  Lab 06/10/19 0241  AST 15  ALT 13  ALKPHOS 41  BILITOT 0.7  PROT 7.3  ALBUMIN 2.8*   No results for input(s): LIPASE, AMYLASE in the last 168 hours. No results for input(s): AMMONIA in the last 168 hours.  Coagulation Profile: No results for input(s): INR, PROTIME in the last 168 hours. Cardiac Enzymes: No results for input(s): CKTOTAL, CKMB, CKMBINDEX, TROPONINI in the last 168 hours. BNP (last 3 results) No results for input(s): PROBNP in the last 8760 hours. HbA1C: No results for input(s): HGBA1C in the last 72 hours. CBG: No results for input(s): GLUCAP in the last 168 hours. Lipid Profile: No results for input(s): CHOL, HDL, LDLCALC, TRIG, CHOLHDL, LDLDIRECT in the last 72 hours. Thyroid Function Tests: No results for input(s): TSH, T4TOTAL, FREET4, T3FREE, THYROIDAB in the last 72 hours. Anemia Panel: No results for input(s): VITAMINB12, FOLATE, FERRITIN, TIBC, IRON, RETICCTPCT in the last 72 hours. Urine analysis:    Component Value Date/Time   COLORURINE YELLOW 07/31/2014 2130   APPEARANCEUR CLOUDY (A) 07/31/2014 2130   LABSPEC 1.034 (H) 07/31/2014 2130   PHURINE 5.0 07/31/2014 2130   GLUCOSEU >1000 (A) 07/31/2014 2130   HGBUR TRACE (A) 07/31/2014 2130   HGBUR trace-lysed 06/16/2010 0833   BILIRUBINUR n  05/22/2015 0927   KETONESUR NEGATIVE 07/31/2014 2130   PROTEINUR 3+ 05/22/2015 0927   PROTEINUR 100 (A) 07/31/2014 2130   UROBILINOGEN 0.2 05/22/2015 0927   UROBILINOGEN 0.2 07/31/2014 2130   NITRITE n 05/22/2015 0927   NITRITE NEGATIVE 07/31/2014 2130   LEUKOCYTESUR Negative 05/22/2015 0927   Sepsis Labs: Recent Results (from the past 240 hour(s))  Novel Coronavirus, NAA ([facility])     Status: Abnormal   Collection Time: 06/07/19 10:07 AM   Specimen: Nasopharyngeal(NP) swabs in vial transport medium   NASOPHARYNGE  TESTING  Result Value Ref Range Status   SARS-CoV-2, NAA Detected (A) Not Detected Final    Comment: This nucleic acid amplification test was developed and its performance characteristics determined by [employer]. Nucleic acid amplification tests include RT-PCR and TMA. This test has not been FDA cleared or approved. This test has been authorized by FDA under an Emergency Use Authorization (EUA). This test is only authorized for the duration of time the declaration that circumstances exist justifying the authorization of the emergency use of in vitro diagnostic tests for detection of SARS-CoV-2 virus and/or diagnosis of COVID-19 infection under section 564(b)(1) of the Act, 21 U.S.C. 967RFF-6(B) (1), unless the authorization is terminated or revoked sooner. When diagnostic testing is negative, the possibility of a false negative result should be considered in the context of a patient's recent exposures and the presence of clinical signs and symptoms consistent with COVID-19. An individual without symptoms of COVID-19 and who is not shedding SARS-CoV-2 virus wo uld expect to have a negative (not detected) result in this assay.      Radiological Exams on Admission: CT HEAD WO CONTRAST  Result Date: 06/10/2019 CLINICAL DATA:  Ataxia, multiple falls EXAM: CT HEAD WITHOUT CONTRAST TECHNIQUE: Contiguous axial images were obtained from the base of the skull  through the vertex without intravenous contrast. COMPARISON:  07/28/2012 FINDINGS: Brain: No evidence of acute infarction, hemorrhage, hydrocephalus, extra-axial collection or mass lesion/mass effect. Subcortical white matter and periventricular small vessel ischemic changes. Vascular: Intracranial atherosclerosis. Skull: Normal. Negative for fracture or focal lesion. Sinuses/Orbits: The visualized paranasal sinuses are essentially clear. The mastoid air cells are unopacified. Other: None. IMPRESSION: No evidence of acute intracranial abnormality. Small vessel ischemic changes. Electronically Signed   By: Julian Hy M.D.   On: 06/10/2019 08:18   DG Chest Portable 1 View  Result Date: 06/10/2019 CLINICAL DATA:  COVID-19.  Cough EXAM: PORTABLE CHEST 1 VIEW COMPARISON:  06/05/2016 FINDINGS: The  heart size and mediastinal contours are within normal limits. Both lungs are clear. The visualized skeletal structures are unremarkable. IMPRESSION: No active disease. Electronically Signed   By: Ulyses Jarred M.D.   On: 06/10/2019 04:06    EKG: Independently reviewed.  Sinus rhythm at 91 bpm with prolonged PR interval and premature atrial complexes.  Assessment/Plan COVID-19 infection: Patient was recently diagnosed with COVID-19 on 1/20 and since then has had cough, generalized weakness, and falls.  Patient reported to be hypoxic with ambulation into the 80s.  Suspect secondary to poor p.o. intake  Chest x-ray otherwise noted to be clear.  -Admit to a telemetry bed -COVID-19 order set utilized -Nasal cannula oxygen as needed to maintain O2 saturation greater than 90% -Follow-up inflammatory markers -Albuterol inhaler -Antitussives as needed -Decadron 6 mg daily -Remdesivir per pharmacy -Monitor inflammatory markers daily  Falls with possible syncope: Patient reports feeling lightheaded and possibly passing out as the reason for his falls.  Denies having any significant injuries.  CT scan of the  brain without acute abnormalities. -Check orthostatic vital signs -Up with assistance -Physical therapy to evaluate and treat  Hypokalemia: Acute.  Initial potassium noted to be 3.2. -Give 40 mEq of potassium chloride x1 dose now -Continue to monitor and replace as needed   ESRD on peritoneal dialysis: Patient normally dialyzes daily.  He still reports being able to make urine.  Labs significant for BUN 45 and creatinine 11.8. -Renal and carb modified diet with fluid restriction -Continue Calcitrol  -Nephrology consulted, follow-up for further recommendations  Elevated Troponin: Acute.  On admission troponin is mildly elevated at 76 and 77.  Patient denies any chest pain.  EKG ischemic changes.  Suspect secondary to demand. -Follow-up telemetry  Anemia of chronic disease: Hemoglobin 11.7 which appears near patient's baseline.  Denies any reports of bleeding. -Continue to monitor  Essential hypertension: On admission blood pressures noted to be soft 94/56.  Home blood pressure medications appear to include telmisartan 20 mg daily and torsemide 100 mg daily. -Restart home regimen when medically appropriate  Diabetes mellitus type 2, controlled: Last hemoglobin A1c noted to be 6.2 last month.  Patient not on any medications for diabetes at home. -Hypoglycemic protocols -CBGs before every meal with sensitive SSI -Adjust insulin regimen as needed  DVT prophylaxis: Heparin Code Status: Full Family Communication: Unable to reach patient's wife after several attempts. Disposition Plan: Possible discharge home once medically stable Consults called: Nephrology Admission status: Inpatient  Norval Morton MD [facility] Pager [phone]   If 7PM-7AM, please contact night-coverage [email or web address] Password [facility]  06/10/2019, 7:14 AM

## 2019-06-10 NOTE — ED Provider Notes (Signed)
Emergency Department Provider Note   I have reviewed the triage vital signs and the nursing notes.   HISTORY  Chief Complaint Covid+ / Frequent Falls   HPI [name] BISSONNETTE is a 69 y.o. male recently diagnosed with coronavirus who presents the emergency department today with frequent falls and generalized weakness.  History is obtained mostly from the wife is the patient is not able to offer much history.  She states that he was diagnosed 8 or 9 days ago and initially was doing okay but her last 3 or 4 days of progressively worsening diarrhea, nausea and decreased p.o. intake.  Just today she was able to start getting some electrolyte cups but patient is still very adamant against eating and drinking.  He had a couple falls and tonight he had where he was not able to get back up.  He was not able to ambulate very well either so she called EMS who brought him here for the evaluation.  Patient denies any pain at this time.  His wife also states she has not think he broke anything or any significant injuries from his fall.   No other associated or modifying symptoms.    Past Medical History:  Diagnosis Date  . Anemia    as a younger man  . Arthritis    hands  . Chronic kidney disease    "starting to bother me now" (07/28/2012)  . Diabetes mellitus     type II  . Diarrhea, functional   . ED (erectile dysfunction)   . Hypertension   . Neuropathy   . Peripheral neuropathy     Patient Active Problem List   Diagnosis Date Noted  . Elevated PSA, between 10 and less than 20 ng/ml 02/12/2017  . ESRD (end stage renal disease) on dialysis ([place]) 02/12/2017  . Retinopathy due to secondary diabetes ([place]) 02/12/2017  . Secondary hyperparathyroidism ([place]) 02/12/2017  . Prostate cancer ([place]) 02/08/2017  . Preop cardiovascular exam 04/27/2016  . Uncontrolled type 2 diabetes mellitus with diabetic nephropathy, with long-term current use of insulin ([place]) 06/21/2015  . Hypercholesteremia  12/11/2014  . Essential hypertension 09/10/2014  . SIRS (systemic inflammatory response syndrome) ([place]) 08/01/2014  . Bronchitis   . Sepsis ([place]) 07/31/2014  . Syncope due to orthostatic hypotension 07/28/2012  . Chronic kidney disease, stage V ([place]) 07/28/2012  . Acute kidney failure ([place]) 07/28/2012  . Hereditary and idiopathic peripheral neuropathy 07/20/2007    Past Surgical History:  Procedure Laterality Date  . APPENDECTOMY  1950's  . AV FISTULA PLACEMENT Right 05/14/2016   Procedure: ARTERIOVENOUS (AV) FISTULA CREATION- RIGHT ARM;  Surgeon: Serafina Mitchell, MD;  Location: [facility];  Service: Vascular;  Laterality: Right;  . COLONOSCOPY      Current Outpatient Rx  . Order #: [id number] Class: OTC  . Order #: [id number] Class: Historical Med  . Order #: [id number] Class: Historical Med  . Order #: [id number] Class: Historical Med  . Order #: [id number] Class: Historical Med    Allergies Other and No known allergies  Family History  Problem Relation Age of Onset  . Colon polyps Mother   . Stroke Mother   . Diabetes Father   . Diabetes Cousin     Social History Social History   Tobacco Use  . Smoking status: Former Smoker    Packs/day: 0.12    Years: 10.00    Pack years: 1.20    Types: Cigarettes    Quit date: 04/27/1986    Years since quitting: 33.1  .  Smokeless tobacco: Never Used  . Tobacco comment: 07/28/2012 "quit smoking cigarettes 20-30 yr ago"  Substance Use Topics  . Alcohol use: Yes    Comment: maybe a couple a month  . Drug use: No    Review of Systems  All other systems negative except as documented in the HPI. All pertinent positives and negatives as reviewed in the HPI. ____________________________________________   PHYSICAL EXAM:  VITAL SIGNS: ED Triage Vitals  Enc Vitals Group     BP 06/10/19 0230 137/71     Pulse Rate 06/10/19 0230 100     Resp 06/10/19 0230 16     Temp 06/10/19 0230 (!) 100.6 F (38.1 C)     Temp Source 06/10/19 0230  Oral     SpO2 06/10/19 0230 97 %    Constitutional: Alert and oriented to self and day. Well appearing and in no acute distress. Eyes: Conjunctivae are normal. PERRL. EOMI. Head: Atraumatic. Nose: No congestion/rhinnorhea. Mouth/Throat: Mucous membranes are moist.  Oropharynx non-erythematous. Neck: No stridor.  No meningeal signs.   Cardiovascular: Normal rate, regular rhythm. Good peripheral circulation. Grossly normal heart sounds.   Respiratory: Normal respiratory effort.  No retractions. Lungs CTAB. Gastrointestinal: Soft and nontender. No distention.  Musculoskeletal: No lower extremity tenderness nor edema. No gross deformities of extremities. Neurologic:  Slowed speech but seems appropriate otherwise and language. No gross focal neurologic deficits are appreciated.  Skin:  Skin is warm, dry and intact. No rash noted.  ____________________________________________   LABS (all labs ordered are listed, but only abnormal results are displayed)  Labs Reviewed  CBC WITH DIFFERENTIAL/PLATELET - Abnormal; Notable for the following components:      Result Value   RBC 4.01 (*)    Hemoglobin 11.7 (*)    HCT 37.1 (*)    Monocytes Absolute 1.6 (*)    All other components within normal limits  COMPREHENSIVE METABOLIC PANEL - Abnormal; Notable for the following components:   Potassium 3.2 (*)    Chloride 96 (*)    Glucose, Bld 140 (*)    BUN 45 (*)    Creatinine, Ser 11.80 (*)    Calcium 8.4 (*)    Albumin 2.8 (*)    GFR calc non Af Amer 4 (*)    GFR calc Af Amer 4 (*)    All other components within normal limits  C DIFFICILE QUICK SCREEN W PCR REFLEX  GI PATHOGEN PANEL BY PCR, STOOL  URINALYSIS, ROUTINE W REFLEX MICROSCOPIC   ____________________________________________  EKG   EKG Interpretation  Date/Time:  Saturday June 10 2019 03:13:08 EST Ventricular Rate:  91 PR Interval:    QRS Duration: 77 QT Interval:  349 QTC Calculation: 430 R Axis:   72 Text  Interpretation: Sinus rhythm Atrial premature complex Consider left atrial enlargement No acute changes Confirmed by Merrily Pew [id number]) on 06/10/2019 3:26:21 AM       ____________________________________________  RADIOLOGY  DG Chest Portable 1 View  Result Date: 06/10/2019 CLINICAL DATA:  COVID-19.  Cough EXAM: PORTABLE CHEST 1 VIEW COMPARISON:  06/05/2016 FINDINGS: The heart size and mediastinal contours are within normal limits. Both lungs are clear. The visualized skeletal structures are unremarkable. IMPRESSION: No active disease. Electronically Signed   By: Ulyses Jarred M.D.   On: 06/10/2019 04:06    ____________________________________________    INITIAL IMPRESSION / ASSESSMENT AND PLAN / ED COURSE  Likely deconditioning from coronavirus.  Febrile here slightly tachycardic will give some fluids and antipyretics.  Evaluate for any  significant anemia, worsening kidney function or other significant abnormalities.  No indication for traumatic work-up.  Patient persistently dizzy and lightheaded near syncopal after fluids.  His work-up is wholly unremarkable however he does have hypoxia when he ambulates.  This in concert with his vomiting, diarrhea, multiple falls and inability of his wife take care of him at home I discussed with hospitalist who will observe for now.   covid test in system a week ago, no need to repeat.    Pertinent labs & imaging results that were available during my care of the patient were reviewed by me and considered in my medical decision making (see chart for details).  ____________________________________________  FINAL CLINICAL IMPRESSION(S) / ED DIAGNOSES  Final diagnoses:  Hypoxia  Dehydration     MEDICATIONS GIVEN DURING THIS VISIT:  Medications  ondansetron (ZOFRAN) injection 4 mg (4 mg Intravenous Refused 06/10/19 0634)  acetaminophen (TYLENOL) tablet 650 mg (650 mg Oral Given 06/10/19 0243)  lactated ringers bolus 1,000 mL (0 mLs  Intravenous Stopped 06/10/19 0602)  acetaminophen (TYLENOL) tablet 1,000 mg (1,000 mg Oral Given 06/10/19 0331)  ondansetron (ZOFRAN-ODT) disintegrating tablet 4 mg (4 mg Oral Given 06/10/19 0331)     NEW OUTPATIENT MEDICATIONS STARTED DURING THIS VISIT:  New Prescriptions   No medications on file    Note:  This note was prepared with assistance of Dragon voice recognition software. Occasional wrong-word or sound-a-like substitutions may have occurred due to the inherent limitations of voice recognition software.   Anadalay Macdonell, Corene Cornea, MD 06/10/19 [phone]

## 2019-06-10 NOTE — Consult Note (Signed)
[name] [name] Admit Date: 06/10/2019 06/10/2019 [name] Requesting Physician:  Tamala Julian MD  Reason for Consult:  ESRD on PD, COVID19  HPI:  42M ESRD on PD, HTN, DM2, recent surgery for prostate Ca .  He was tested positive for COVID-19 on 06/07/2019.  Apparently he acquired this from his granddaughter.  I follow him as an outpatient and he was set up for monoclonal antibody infusion on 1/25 but overnight he presented to the emergency room after falling on his way to the bathroom, twice.  He did hit his head, but his CT in the ER was negative.    Admitted for fevers, fall, question of ataxia.  He does well with peritoneal dialysis per recent quarterly clearances were below target.  His regimen had already been modified, now it is CCPD, 3L x5, 1h56min dwell, dry day with no pause.  Presenting labs with K3.2, BUN 45, bicarbonate 24.  He denies any abdominal pain, cloudy effluent.  He remains as friendly and jovial as ever.  He has been started on remdesivir and dexamethasone.  Chest x-ray at presentation was negative.  Currently on room air.  T-max of 101.3 overnight.  Blood cultures pending.   Creatinine (mg/dL)  Date Value  09/16/2016 7.2 (A)   Creatinine, Ser (mg/dL)  Date Value  06/10/2019 11.80 (H)  06/05/2016 5.80 (H)  09/24/2015 2.42 (H)  05/22/2015 2.44 (H)  01/08/2015 1.49  11/27/2014 1.70 (H)  08/28/2014 1.48  08/02/2014 1.68 (H)  08/01/2014 1.83 (H)  07/31/2014 2.08 (H)  ] I/Os:  Balance of 12 systems is negative w/ exceptions as above  PMH  Past Medical History:  Diagnosis Date  . Anemia    as a younger man  . Arthritis    hands  . Chronic kidney disease    "starting to bother me now" (07/28/2012)  . Diabetes mellitus     type II  . Diarrhea, functional   . ED (erectile dysfunction)   . Hypertension   . Neuropathy   . Peripheral neuropathy    PSH  Past Surgical History:  Procedure Laterality Date  . APPENDECTOMY  1950's  . AV FISTULA  PLACEMENT Right 05/14/2016   Procedure: ARTERIOVENOUS (AV) FISTULA CREATION- RIGHT ARM;  Surgeon: Serafina Mitchell, MD;  Location: [facility];  Service: Vascular;  Laterality: Right;  . COLONOSCOPY     FH  Family History  Problem Relation Age of Onset  . Colon polyps Mother   . Stroke Mother   . Diabetes Father   . Diabetes Cousin    SH  reports that he quit smoking about 33 years ago. His smoking use included cigarettes. He has a 1.20 pack-year smoking history. He has never used smokeless tobacco. He reports current alcohol use. He reports that he does not use drugs. Allergies  Allergies  Allergen Reactions  . Other Rash    Oxyquinoline-white Pet-lanolin  . No Known Allergies    Home medications Prior to Admission medications   Medication Sig Start Date End Date Taking? Authorizing Provider  aspirin 81 MG tablet Take 1 tablet (81 mg total) by mouth daily. 08/09/14   Marletta Lor, MD  calcitRIOL (ROCALTROL) 0.5 MCG capsule Take by mouth. 12/09/17   [provider]  telmisartan (MICARDIS) 20 MG tablet Take 20 mg by mouth daily. 08/23/18   [provider]  torsemide (DEMADEX) 100 MG tablet TAKE 1 TABLET BY MOUTH EVERY DAY IN THE MORNING 01/10/18   [provider]  UNABLE TO  FIND TAKE 1 TABLET BY MOUTH EVERY DAY (ON DIALYSIS DAYS, TAKE AFTER DIALYSIS TREATMENT) 12/24/17   [provider]    Current Medications Scheduled Meds: . albuterol  2 puff Inhalation Q6H  . vitamin C  500 mg Oral Daily  . calcitRIOL  0.5 mcg Oral Daily  . dexamethasone  6 mg Oral Daily  . heparin  5,000 Units Subcutaneous Q8H  . insulin aspart  0-9 Units Subcutaneous TID WC  . ondansetron (ZOFRAN) IV  4 mg Intravenous Once  . sodium chloride flush  3 mL Intravenous Q12H  . zinc sulfate  220 mg Oral Daily   Continuous Infusions: . remdesivir 200 mg in sodium chloride 0.9% 250 mL IVPB     Followed by  . [START ON 06/11/2019] remdesivir 100 mg in NS 100 mL     PRN  Meds:.acetaminophen, chlorpheniramine-HYDROcodone, guaiFENesin-dextromethorphan, ondansetron **OR** ondansetron (ZOFRAN) IV  CBC Recent Labs  Lab 06/10/19 0241 06/10/19 0740  WBC 9.3 7.8  NEUTROABS 6.1 4.9  HGB 11.7* 10.7*  HCT 37.1* 32.8*  MCV 92.5 90.4  PLT 237 479   Basic Metabolic Panel Recent Labs  Lab 06/10/19 0241  NA 135  K 3.2*  CL 96*  CO2 24  GLUCOSE 140*  BUN 45*  CREATININE 11.80*  CALCIUM 8.4*    Physical Exam  Blood pressure (!) 110/91, pulse 80, temperature 98.7 F (37.1 C), temperature source Oral, resp. rate 14, SpO2 97 %. GEN: NAD ENT: NCAT EYES: EOMI CV: RRR nl s1s2 PULM: CTAB ABD: s nt nd,  SKIN: PD Cath exit site pristine EXT:no LEE  Assessment 52M ESRD on PD with COVID19, falls, weakness, fevers  1. ESRD on PD, CCPD 3L x5, 1h52min, dry day, receint inc in Rx 2. COVID19, dex/remdesivir per [facility] 3. Falls at home, probably from #2 and hypovolemia 4. Fevers, probalby #2, need to r/o PD peritonitis but doubt present 5. Anemia, Hb at goal 6. CKD-BMD, stable  Plan 1. PD tonight 3L x5, 1.5% 2. Check PD cell count and culture, doubt PD peritonitis 3. Will follow along   [name]  [phone] 06/10/2019, 11:55 AM

## 2019-06-10 NOTE — ED Notes (Addendum)
Ambulated pt in room, gait unsteady. O2 sats dropped to 88% when pt walked in place, nurse aware.

## 2019-06-10 NOTE — ED Triage Notes (Addendum)
Patient tested positve for Covid19 last week , reports frequent falls this week and today , denies injury , respirations unlabored , febrile at triage .

## 2019-06-11 LAB — CBC WITH DIFFERENTIAL/PLATELET
Abs Immature Granulocytes: 0.04 10*3/uL (ref 0.00–0.07)
Basophils Absolute: 0 10*3/uL (ref 0.0–0.1)
Basophils Relative: 0 %
Eosinophils Absolute: 0 10*3/uL (ref 0.0–0.5)
Eosinophils Relative: 0 %
HCT: 32.7 % — ABNORMAL LOW (ref 39.0–52.0)
Hemoglobin: 10.7 g/dL — ABNORMAL LOW (ref 13.0–17.0)
Immature Granulocytes: 1 %
Lymphocytes Relative: 14 %
Lymphs Abs: 0.7 10*3/uL (ref 0.7–4.0)
MCH: 29.2 pg (ref 26.0–34.0)
MCHC: 32.7 g/dL (ref 30.0–36.0)
MCV: 89.1 fL (ref 80.0–100.0)
Monocytes Absolute: 0.3 10*3/uL (ref 0.1–1.0)
Monocytes Relative: 6 %
Neutro Abs: 4.1 10*3/uL (ref 1.7–7.7)
Neutrophils Relative %: 79 %
Platelets: 244 10*3/uL (ref 150–400)
RBC: 3.67 MIL/uL — ABNORMAL LOW (ref 4.22–5.81)
RDW: 12.2 % (ref 11.5–15.5)
WBC: 5.2 10*3/uL (ref 4.0–10.5)
nRBC: 0 % (ref 0.0–0.2)

## 2019-06-11 LAB — GRAM STAIN: Gram Stain: NONE SEEN

## 2019-06-11 LAB — COMPREHENSIVE METABOLIC PANEL
ALT: 14 U/L (ref 0–44)
AST: 18 U/L (ref 15–41)
Albumin: 2.2 g/dL — ABNORMAL LOW (ref 3.5–5.0)
Alkaline Phosphatase: 37 U/L — ABNORMAL LOW (ref 38–126)
Anion gap: 14 (ref 5–15)
BUN: 54 mg/dL — ABNORMAL HIGH (ref 8–23)
CO2: 23 mmol/L (ref 22–32)
Calcium: 8.2 mg/dL — ABNORMAL LOW (ref 8.9–10.3)
Chloride: 97 mmol/L — ABNORMAL LOW (ref 98–111)
Creatinine, Ser: 12.01 mg/dL — ABNORMAL HIGH (ref 0.61–1.24)
GFR calc Af Amer: 4 mL/min — ABNORMAL LOW (ref >60)
GFR calc non Af Amer: 4 mL/min — ABNORMAL LOW (ref >60)
Glucose, Bld: 279 mg/dL — ABNORMAL HIGH (ref 70–99)
Potassium: 4.4 mmol/L (ref 3.5–5.1)
Sodium: 134 mmol/L — ABNORMAL LOW (ref 135–145)
Total Bilirubin: 0.3 mg/dL (ref 0.3–1.2)
Total Protein: 6.4 g/dL — ABNORMAL LOW (ref 6.5–8.1)

## 2019-06-11 LAB — GLUCOSE, CAPILLARY
Glucose-Capillary: 195 mg/dL — ABNORMAL HIGH (ref 70–99)
Glucose-Capillary: 166 mg/dL — ABNORMAL HIGH (ref 70–99)
Glucose-Capillary: 98 mg/dL (ref 70–99)
Glucose-Capillary: 153 mg/dL — ABNORMAL HIGH (ref 70–99)
Glucose-Capillary: 215 mg/dL — ABNORMAL HIGH (ref 70–99)

## 2019-06-11 LAB — VITAMIN D 25 HYDROXY (VIT D DEFICIENCY, FRACTURES): Vit D, 25-Hydroxy: 6.78 ng/mL — ABNORMAL LOW (ref 30–100)

## 2019-06-11 LAB — C DIFFICILE QUICK SCREEN W PCR REFLEX
C Diff antigen: NEGATIVE
C Diff interpretation: NOT DETECTED
C Diff toxin: NEGATIVE

## 2019-06-11 LAB — PHOSPHORUS: Phosphorus: 6.4 mg/dL — ABNORMAL HIGH (ref 2.5–4.6)

## 2019-06-11 LAB — FERRITIN: Ferritin: 1382 ng/mL — ABNORMAL HIGH (ref 24–336)

## 2019-06-11 LAB — MAGNESIUM: Magnesium: 2.1 mg/dL (ref 1.7–2.4)

## 2019-06-11 LAB — C-REACTIVE PROTEIN: CRP: 11.2 mg/dL — ABNORMAL HIGH (ref <1.0)

## 2019-06-11 LAB — D-DIMER, QUANTITATIVE: D-Dimer, Quant: 0.61 ug/mL-FEU — ABNORMAL HIGH (ref 0.00–0.50)

## 2019-06-11 MED ORDER — VITAMIN D 25 MCG (1000 UNIT) PO TABS
1000.0000 [IU] | ORAL_TABLET | Freq: Every day | ORAL | Status: DC
  Administered 2019-06-11 – 2019-06-12 (×2): 1000 [IU] via ORAL
  Filled 2019-06-11 (×2): qty 1

## 2019-06-11 MED ORDER — IPRATROPIUM-ALBUTEROL 20-100 MCG/ACT IN AERS
1.0000 | INHALATION_SPRAY | Freq: Four times a day (QID) | RESPIRATORY_TRACT | Status: DC
  Administered 2019-06-12 (×2): 1 via RESPIRATORY_TRACT
  Filled 2019-06-11: qty 4

## 2019-06-11 NOTE — Progress Notes (Signed)
Patient's spouse Jahmeek Shirk updated about plan of care. Answers all question.

## 2019-06-11 NOTE — Progress Notes (Signed)
PROGRESS NOTE    [name] [name]  NID:[id number] DOB: 1951-03-07 DOA: 06/10/2019 PCP: [name], [name], MD   Brief Narrative:  69 year old with history of HTN, ESRD on PD, DM 2 with peripheral neuropathy, OA, chronic anemia, functional diarrhea presented to the hospital with history of 2 days of fall due to weakness and was tested positive for COVID-19 on 1/20.  In the ER was desaturating down to 80% with ambulation.  Chest x-ray was clean.  CT head was negative.   Assessment & Plan:   Principal Problem:   COVID-19 virus infection Active Problems:   Diabetes mellitus type 2, controlled ([place])   ESRD on peritoneal dialysis ([place])   Hypokalemia   Fall at home, initial encounter   Syncope   Elevated troponin  Generalized weakness with falls Acute hypoxia secondary to COVID-19 pneumonia -Oxygen levels-room air at rest, 2 L with ambulation -Remdesivir-D2 -Decadrone-D2 -Routine: Labs have been reviewed including ferritin, LDH, CRP, d-dimer, fibrinogen.  Will need to trend this lab daily. -Vitamin C & Zinc. Prone >16hrs/day.  -Chest x-ray-overall clear -Supportive care-antitussive, inhalers, I-S/flutter -CODE STATUS confirmed -PT/OT -CT head-negative  Vitamin D deficiency -Supplements ordered  Hypokalemia -Repletion as necessary  ESRD on peritoneal dialysis -Nephrology team consulted  Anemia of chronic disease -Hemoglobin at baseline of 11.7.  Elevated troponin -Perhaps slight demand ischemia.  Troponins are flat, he is chest pain-free.  EKG appears to be nonischemic  Essential hypertension -Currently due to soft blood pressure, home regimen is on hold  Diabetes mellitus type 2, controlled on diet -Insulin sliding scale and Accu-Chek   DVT prophylaxis: Subcutaneous heparin Code Status: Full code Family Communication: None Disposition Plan: Maintain hospital stay until he is safely weaned off oxygen.  Not on any home oxygen.  In the meantime awaiting  evaluation by PT/OT for safe disposition planning  Consultants:   Nephrology  Procedures:   None  Antimicrobials:   None   Subjective: Overall feels better but slightly weak on his feet.  Denies any shortness of breath.  Review of Systems Otherwise negative except as per HPI, including: General: Denies fever, chills, night sweats or unintended weight loss. Resp: Denies cough, wheezing, shortness of breath. Cardiac: Denies chest pain, palpitations, orthopnea, paroxysmal nocturnal dyspnea. GI: Denies abdominal pain, nausea, vomiting, diarrhea or constipation GU: Denies dysuria, frequency, hesitancy or incontinence MS: Denies muscle aches, joint pain or swelling Neuro: Denies headache, neurologic deficits (focal weakness, numbness, tingling), abnormal gait Psych: Denies anxiety, depression, SI/HI/AVH Skin: Denies new rashes or lesions ID: Denies sick contacts, exotic exposures, travel  Objective: Vitals:   06/10/19 2151 06/11/19 0000 06/11/19 0400 06/11/19 0526  BP: 134/73 118/72 115/71 108/70  Pulse: 76 74 82   Resp: 15 14 16    Temp: 98 F (36.7 C)   98.5 F (36.9 C)  TempSrc: Oral   Oral  SpO2: 98% 99% 98%   Weight:    60.8 kg  Height:        Intake/Output Summary (Last 24 hours) at 06/11/2019 0808 Last data filed at 06/11/2019 0534 Gross per 24 hour  Intake 540 ml  Output 1 ml  Net 539 ml   Filed Weights   06/10/19 1000 06/10/19 1926 06/11/19 0526  Weight: 60.8 kg 60.3 kg 60.8 kg    Examination:  General exam: Appears calm and comfortable  Respiratory system: Clear to auscultation. Respiratory effort normal. Cardiovascular system: S1 & S2 heard, RRR. No JVD, murmurs, rubs, gallops or clicks. No pedal edema. Gastrointestinal system: Abdomen  is nondistended, soft and nontender. No organomegaly or masses felt. Normal bowel sounds heard. Central nervous system: Alert and oriented. No focal neurological deficits. Extremities: Symmetric 5 x 5 power. Skin: No  rashes, lesions or ulcers Psychiatry: Judgement and insight appear normal. Mood & affect appropriate.  PD catheter site noted with any evidence of bleeding or infection   Data Reviewed:   CBC: Recent Labs  Lab 06/10/19 0241 06/10/19 0740 06/11/19 0239  WBC 9.3 7.8 5.2  NEUTROABS 6.1 4.9 4.1  HGB 11.7* 10.7* 10.7*  HCT 37.1* 32.8* 32.7*  MCV 92.5 90.4 89.1  PLT 237 211 016   Basic Metabolic Panel: Recent Labs  Lab 06/10/19 0241 06/11/19 0239  NA 135 134*  K 3.2* 4.4  CL 96* 97*  CO2 24 23  GLUCOSE 140* 279*  BUN 45* 54*  CREATININE 11.80* 12.01*  CALCIUM 8.4* 8.2*  MG  --  2.1  PHOS  --  6.4*   GFR: Estimated Creatinine Clearance: 4.9 mL/min (A) (by C-G formula based on SCr of 12.01 mg/dL (H)). Liver Function Tests: Recent Labs  Lab 06/10/19 0241 06/11/19 0239  AST 15 18  ALT 13 14  ALKPHOS 41 37*  BILITOT 0.7 0.3  PROT 7.3 6.4*  ALBUMIN 2.8* 2.2*   No results for input(s): LIPASE, AMYLASE in the last 168 hours. No results for input(s): AMMONIA in the last 168 hours. Coagulation Profile: No results for input(s): INR, PROTIME in the last 168 hours. Cardiac Enzymes: No results for input(s): CKTOTAL, CKMB, CKMBINDEX, TROPONINI in the last 168 hours. BNP (last 3 results) No results for input(s): PROBNP in the last 8760 hours. HbA1C: No results for input(s): HGBA1C in the last 72 hours. CBG: Recent Labs  Lab 06/10/19 0919 06/10/19 1150 06/10/19 1659 06/10/19 2154  GLUCAP 87 124* 112* 248*   Lipid Profile: Recent Labs    06/10/19 0740  TRIG 223*   Thyroid Function Tests: No results for input(s): TSH, T4TOTAL, FREET4, T3FREE, THYROIDAB in the last 72 hours. Anemia Panel: Recent Labs    06/10/19 0740 06/11/19 0239  FERRITIN 1,123* 1,382*   Sepsis Labs: Recent Labs  Lab 06/10/19 0740 06/10/19 1127  PROCALCITON 1.73  --   LATICACIDVEN 0.7 0.7    Recent Results (from the past 240 hour(s))  Novel Coronavirus, NAA ([facility])     Status:  Abnormal   Collection Time: 06/07/19 10:07 AM   Specimen: Nasopharyngeal(NP) swabs in vial transport medium   NASOPHARYNGE  TESTING  Result Value Ref Range Status   SARS-CoV-2, NAA Detected (A) Not Detected Final    Comment: This nucleic acid amplification test was developed and its performance characteristics determined by [employer]. Nucleic acid amplification tests include RT-PCR and TMA. This test has not been FDA cleared or approved. This test has been authorized by FDA under an Emergency Use Authorization (EUA). This test is only authorized for the duration of time the declaration that circumstances exist justifying the authorization of the emergency use of in vitro diagnostic tests for detection of SARS-CoV-2 virus and/or diagnosis of COVID-19 infection under section 564(b)(1) of the Act, 21 U.S.C. 010XNA-3(F) (1), unless the authorization is terminated or revoked sooner. When diagnostic testing is negative, the possibility of a false negative result should be considered in the context of a patient's recent exposures and the presence of clinical signs and symptoms consistent with COVID-19. An individual without symptoms of COVID-19 and who is not shedding SARS-CoV-2 virus wo uld expect to have a negative (not detected)  result in this assay.   Culture, body fluid-bottle     Status: None (Preliminary result)   Collection Time: 06/10/19  8:26 PM   Specimen: Peritoneal Dialysate  Result Value Ref Range Status   Specimen Description   Final    PERITONEAL DIALYSATE Performed at [facility], [address], [place], [place] [address]    Special Requests   Final    BOTTLES DRAWN AEROBIC AND ANAEROBIC Blood Culture adequate volume   Culture PENDING  Incomplete   Report Status PENDING  Incomplete  Gram stain     Status: None   Collection Time: 06/10/19  8:26 PM   Specimen: Peritoneal Dialysate  Result Value Ref Range Status   Specimen Description PERITONEAL  DIALYSATE FLUID  Final   Special Requests NONE  Final   Gram Stain   Final    NO WBC SEEN NO ORGANISMS SEEN Performed at [facility], [address], [place], [place] [address]    Report Status 06/11/2019 FINAL  Final  C difficile quick scan w PCR reflex     Status: None   Collection Time: 06/10/19  8:40 PM   Specimen: STOOL  Result Value Ref Range Status   C Diff antigen NEGATIVE NEGATIVE Final   C Diff toxin NEGATIVE NEGATIVE Final   C Diff interpretation No C. difficile detected.  Final    Comment: Performed at [facility], [place] [address], [place], [place] [address]         Radiology Studies: CT HEAD WO CONTRAST  Result Date: 06/10/2019 CLINICAL DATA:  Ataxia, multiple falls EXAM: CT HEAD WITHOUT CONTRAST TECHNIQUE: Contiguous axial images were obtained from the base of the skull through the vertex without intravenous contrast. COMPARISON:  07/28/2012 FINDINGS: Brain: No evidence of acute infarction, hemorrhage, hydrocephalus, extra-axial collection or mass lesion/mass effect. Subcortical white matter and periventricular small vessel ischemic changes. Vascular: Intracranial atherosclerosis. Skull: Normal. Negative for fracture or focal lesion. Sinuses/Orbits: The visualized paranasal sinuses are essentially clear. The mastoid air cells are unopacified. Other: None. IMPRESSION: No evidence of acute intracranial abnormality. Small vessel ischemic changes. Electronically Signed   By: Julian Hy M.D.   On: 06/10/2019 08:18   DG Chest Portable 1 View  Result Date: 06/10/2019 CLINICAL DATA:  COVID-19.  Cough EXAM: PORTABLE CHEST 1 VIEW COMPARISON:  06/05/2016 FINDINGS: The heart size and mediastinal contours are within normal limits. Both lungs are clear. The visualized skeletal structures are unremarkable. IMPRESSION: No active disease. Electronically Signed   By: Ulyses Jarred M.D.   On: 06/10/2019 04:06        Scheduled Meds: . vitamin C  500 mg Oral Daily    . calcitRIOL  0.5 mcg Oral Daily  . dexamethasone  6 mg Oral Daily  . gentamicin cream  1 application Topical Daily  . heparin  5,000 Units Subcutaneous Q8H  . insulin aspart  0-9 Units Subcutaneous TID WC  . ondansetron (ZOFRAN) IV  4 mg Intravenous Once  . sodium chloride flush  3 mL Intravenous Q12H  . zinc sulfate  220 mg Oral Daily   Continuous Infusions: . dialysis solution 1.5% low-MG/low-CA    . remdesivir 100 mg in NS 100 mL       LOS: 1 day   Time spent= 25 mins    Caryle Helgeson Arsenio Loader, MD [facility]  If 7PM-7AM, please contact night-coverage  06/11/2019, 8:08 AM

## 2019-06-11 NOTE — Evaluation (Signed)
Physical Therapy Evaluation Patient Details Name: [name] [name] MRN: [id number] DOB: 11/13/1950 Today's Date: 06/11/2019   History of Present Illness  69 year old with history of HTN, ESRD on PD, DM 2 with peripheral neuropathy, OA, chronic anemia, functional diarrhea presented to the hospital with history of 2 days of fall due to weakness and was tested positive for COVID-19 on 1/20.  O2 saturation maintained on room air while at rest. With ambulation patient's O2 saturations were noted to drop into the 80s.  Clinical Impression   Pt admitted with above diagnosis. Noted significant orthostasis in ED and repeated orthostatic vital signs with pt having mild decrease supine to sit to stand, however fully recovered to above baseline by the 3 minute BP. Sats on room air 94-98% while ambulating 100 ft. Slight imbalance (he recovered without external support) during initial walk, however no further LOB during second walk. Educated on use of RW vs SPC with pt verbalizing understanding to use RW at home if he continues to feel weak. He was able to verbalized the need to not "pop up" and go--reporting that he knows he needs to change positions slowly and allow his BP to stabilize. Pt currently with functional limitations due to the deficits listed below (see PT Problem List). Pt will benefit from skilled PT to increase their independence and safety with mobility to allow discharge to the venue listed below.       Follow Up Recommendations No PT follow up;Supervision - Intermittent(supervision due to syncope)    Equipment Recommendations  None recommended by PT    Recommendations for Other Services       Precautions / Restrictions Precautions Precautions: Other (comment) Precaution Comments: syncope/falls; monitor sats      Mobility  Bed Mobility               General bed mobility comments: not tested; up in chair  Transfers Overall transfer level: Needs assistance Equipment used:  None Transfers: Sit to/from Stand Sit to Stand: Supervision         General transfer comment: supervision for safety due to recent falls/syncope; x 3 reps without issue  Ambulation/Gait Ambulation/Gait assistance: Min guard Gait Distance (Feet): 100 Feet(seated rest, 100) Assistive device: None Gait Pattern/deviations: Step-through pattern;Decreased stride length;Staggering left     General Gait Details: pt with stagger step to his left after ~75 ft of first walk; seated rest and walked 100 ft again with no imbalance  Stairs            Wheelchair Mobility    Modified Rankin (Stroke Patients Only)       Balance Overall balance assessment: Mild deficits observed, not formally tested                                           Pertinent Vitals/Pain Pain Assessment: No/denies pain    Home Living Family/patient expects to be discharged to:: Private residence Living Arrangements: Spouse/significant other;Children(daughter; 2 grandaughters) Available Help at Discharge: Family;Available 24 hours/day(all tested positive) Type of Home: House Home Access: Stairs to enter Entrance Stairs-Rails: Psychiatric nurse of Steps: 2 Home Layout: One level Home Equipment: Clinical cytogeneticist - 2 wheels      Prior Function Level of Independence: Independent         Comments: drives     Hand Dominance   Dominant Hand: Right    Extremity/Trunk Assessment  Upper Extremity Assessment Upper Extremity Assessment: Overall WFL for tasks assessed    Lower Extremity Assessment Lower Extremity Assessment: Overall WFL for tasks assessed    Cervical / Trunk Assessment Cervical / Trunk Assessment: Normal  Communication   Communication: No difficulties  Cognition Arousal/Alertness: Awake/alert Behavior During Therapy: WFL for tasks assessed/performed Overall Cognitive Status: Within Functional Limits for tasks assessed                                         General Comments General comments (skin integrity, edema, etc.): Patient on room air; during ambulation his pulse ox did not register; immediately upon sitting sats 94-98%. Pt inquired about using a cane if his legs continue to feel "wobbly" Educated on rationale for recommending RW over [facility] and he reports he could use his wife's (previous TKR).     Exercises     Assessment/Plan    PT Assessment Patient needs continued PT services  PT Problem List Decreased balance;Decreased mobility;Decreased knowledge of use of DME       PT Treatment Interventions DME instruction;Gait training;Functional mobility training;Therapeutic activities;Balance training;Patient/family education    PT Goals (Current goals can be found in the Care Plan section)  Acute Rehab PT Goals Patient Stated Goal: regain his strength PT Goal Formulation: With patient Time For Goal Achievement: 06/25/19 Potential to Achieve Goals: Good    Frequency Min 3X/week   Barriers to discharge        Co-evaluation               AM-PAC PT "6 Clicks" Mobility  Outcome Measure Help needed turning from your back to your side while in a flat bed without using bedrails?: None Help needed moving from lying on your back to sitting on the side of a flat bed without using bedrails?: None Help needed moving to and from a bed to a chair (including a wheelchair)?: None Help needed standing up from a chair using your arms (e.g., wheelchair or bedside chair)?: None Help needed to walk in hospital room?: A Little Help needed climbing 3-5 steps with a railing? : A Little 6 Click Score: 22    End of Session Equipment Utilized During Treatment: Gait belt Activity Tolerance: Patient tolerated treatment well Patient left: in chair;with call bell/phone within reach Nurse Communication: Mobility status;Other (comment)(sats with activity) PT Visit Diagnosis: Unsteadiness on feet (R26.81)    Time:  1856-3149 PT Time Calculation (min) (ACUTE ONLY): 49 min   Charges:   PT Evaluation $PT Eval Moderate Complexity: 1 Mod PT Treatments $Gait Training: 23-37 mins         Arby Barrette, PT Pager [phone]   Rexanne Mano 06/11/2019, 5:30 PM

## 2019-06-11 NOTE — Progress Notes (Signed)
Admit: 06/10/2019 LOS: 1  28M ESRD on PD with COVID19, falls, weakness, fevers  Subjective:  . Feels good, no complaints, on room air . PD overnight without issues, UF 685 mL . Had PD fluid sent off for analysis, I think this was a sample obtained after a dry day:   Ref. Range 06/10/2019 20:26  Total Nucleated Cell Count, Fluid Latest Ref Range: 0 - 1,000 cu mm 175  Lymphs, Fluid Latest Units: % 21  Eos, Fluid Latest Units: % 0  Appearance, Fluid Latest Ref Range: CLEAR  CLEAR  Neutrophil Count, Fluid Latest Ref Range: 0 - 25 % 2  Monocyte-Macrophage-Serous Fluid Latest Ref Range: 50 - 90 % 77    He denies abdominal pain, cloudy effluent.  I think the above results are not reflective of PD peritonitis  01/23 0701 - 01/24 0700 In: 540 [P.O.:540] Out: 1 [Stool:1]  Filed Weights   06/10/19 1926 06/11/19 0526 06/11/19 0923  Weight: 60.3 kg 60.8 kg 63 kg    Scheduled Meds: . vitamin C  500 mg Oral Daily  . calcitRIOL  0.5 mcg Oral Daily  . dexamethasone  6 mg Oral Daily  . gentamicin cream  1 application Topical Daily  . heparin  5,000 Units Subcutaneous Q8H  . insulin aspart  0-9 Units Subcutaneous TID WC  . Ipratropium-Albuterol  1 puff Inhalation Q6H  . ondansetron (ZOFRAN) IV  4 mg Intravenous Once  . sodium chloride flush  3 mL Intravenous Q12H  . zinc sulfate  220 mg Oral Daily   Continuous Infusions: . dialysis solution 1.5% low-MG/low-CA    . remdesivir 100 mg in NS 100 mL 100 mg (06/11/19 0956)   PRN Meds:.acetaminophen, chlorpheniramine-HYDROcodone, guaiFENesin-dextromethorphan, heparin, ondansetron **OR** ondansetron (ZOFRAN) IV  Current Labs: reviewed   Physical Exam:  Blood pressure 128/66, pulse 82, temperature 98.6 F (37 C), temperature source Oral, resp. rate 16, height 5\' 10"  (1.778 m), weight 63 kg, SpO2 98 %. GEN: NAD ENT: NCAT EYES: EOMI CV: RRR nl s1s2 PULM: CTAB ABD: s nt nd,  SKIN: PD Cath exit site pristine EXT:no LEE  A 1. ESRD on PD,  [facility] 3L x5, 1h79min, dry day, receint inc in Rx 2. COVID19, dex/remdesivir per [facility] 3. Falls at home, probably from #2 and hypovolemia, PT/OT 4. Fevers, probalby #2, PD fluid not consistent with peritonitis at arrival 5. Anemia, Hb at goal 6. CKD-BMD, stable, continue outpatient therapies  P . Continue PD, use 1.5% dextrose for all fluid . Medication Issues; o Preferred narcotic agents for pain control are hydromorphone, fentanyl, and methadone. Morphine should not be used.  o Baclofen should be avoided o Avoid oral sodium phosphate and magnesium citrate based laxatives / bowel preps    Pearson Grippe MD 06/11/2019, 11:09 AM  Recent Labs  Lab 06/10/19 0241 06/11/19 0239  NA 135 134*  K 3.2* 4.4  CL 96* 97*  CO2 24 23  GLUCOSE 140* 279*  BUN 45* 54*  CREATININE 11.80* 12.01*  CALCIUM 8.4* 8.2*  PHOS  --  6.4*   Recent Labs  Lab 06/10/19 0241 06/10/19 0740 06/11/19 0239  WBC 9.3 7.8 5.2  NEUTROABS 6.1 4.9 4.1  HGB 11.7* 10.7* 10.7*  HCT 37.1* 32.8* 32.7*  MCV 92.5 90.4 89.1  PLT 237 211 244

## 2019-06-12 ENCOUNTER — Ambulatory Visit (HOSPITAL_COMMUNITY): Payer: BC Managed Care – PPO

## 2019-06-12 LAB — CBC WITH DIFFERENTIAL/PLATELET
Abs Immature Granulocytes: 0.06 10*3/uL (ref 0.00–0.07)
Basophils Absolute: 0 10*3/uL (ref 0.0–0.1)
Basophils Relative: 0 %
Eosinophils Absolute: 0 10*3/uL (ref 0.0–0.5)
Eosinophils Relative: 0 %
HCT: 32 % — ABNORMAL LOW (ref 39.0–52.0)
Hemoglobin: 10.9 g/dL — ABNORMAL LOW (ref 13.0–17.0)
Immature Granulocytes: 1 %
Lymphocytes Relative: 11 %
Lymphs Abs: 0.9 10*3/uL (ref 0.7–4.0)
MCH: 29.1 pg (ref 26.0–34.0)
MCHC: 34.1 g/dL (ref 30.0–36.0)
MCV: 85.3 fL (ref 80.0–100.0)
Monocytes Absolute: 0.6 10*3/uL (ref 0.1–1.0)
Monocytes Relative: 7 %
Neutro Abs: 6.9 10*3/uL (ref 1.7–7.7)
Neutrophils Relative %: 81 %
Platelets: 280 10*3/uL (ref 150–400)
RBC: 3.75 MIL/uL — ABNORMAL LOW (ref 4.22–5.81)
RDW: 12.2 % (ref 11.5–15.5)
WBC: 8.5 10*3/uL (ref 4.0–10.5)
nRBC: 0 % (ref 0.0–0.2)

## 2019-06-12 LAB — COMPREHENSIVE METABOLIC PANEL
ALT: 17 U/L (ref 0–44)
AST: 17 U/L (ref 15–41)
Albumin: 2.5 g/dL — ABNORMAL LOW (ref 3.5–5.0)
Alkaline Phosphatase: 39 U/L (ref 38–126)
Anion gap: 18 — ABNORMAL HIGH (ref 5–15)
BUN: 66 mg/dL — ABNORMAL HIGH (ref 8–23)
CO2: 22 mmol/L (ref 22–32)
Calcium: 8.4 mg/dL — ABNORMAL LOW (ref 8.9–10.3)
Chloride: 94 mmol/L — ABNORMAL LOW (ref 98–111)
Creatinine, Ser: 12.84 mg/dL — ABNORMAL HIGH (ref 0.61–1.24)
GFR calc Af Amer: 4 mL/min — ABNORMAL LOW (ref >60)
GFR calc non Af Amer: 3 mL/min — ABNORMAL LOW (ref >60)
Glucose, Bld: 241 mg/dL — ABNORMAL HIGH (ref 70–99)
Potassium: 4.1 mmol/L (ref 3.5–5.1)
Sodium: 134 mmol/L — ABNORMAL LOW (ref 135–145)
Total Bilirubin: 0.8 mg/dL (ref 0.3–1.2)
Total Protein: 6.5 g/dL (ref 6.5–8.1)

## 2019-06-12 LAB — C-REACTIVE PROTEIN: CRP: 8.5 mg/dL — ABNORMAL HIGH (ref <1.0)

## 2019-06-12 LAB — PHOSPHORUS: Phosphorus: 7.7 mg/dL — ABNORMAL HIGH (ref 2.5–4.6)

## 2019-06-12 LAB — GLUCOSE, CAPILLARY
Glucose-Capillary: 210 mg/dL — ABNORMAL HIGH (ref 70–99)
Glucose-Capillary: 84 mg/dL (ref 70–99)

## 2019-06-12 LAB — MAGNESIUM: Magnesium: 2.2 mg/dL (ref 1.7–2.4)

## 2019-06-12 LAB — FERRITIN: Ferritin: 1761 ng/mL — ABNORMAL HIGH (ref 24–336)

## 2019-06-12 LAB — PATHOLOGIST SMEAR REVIEW: Path Review: REACTIVE

## 2019-06-12 LAB — D-DIMER, QUANTITATIVE: D-Dimer, Quant: 0.5 ug/mL-FEU (ref 0.00–0.50)

## 2019-06-12 MED ORDER — VITAMIN D3 25 MCG PO TABS: 1000.0000 [IU] | ORAL_TABLET | Freq: Every day | ORAL | 1 refills | Status: DC

## 2019-06-12 MED ORDER — ALBUTEROL SULFATE HFA 108 (90 BASE) MCG/ACT IN AERS: 2.0000 | INHALATION_SPRAY | Freq: Four times a day (QID) | RESPIRATORY_TRACT | 0 refills | Status: DC | PRN

## 2019-06-12 MED ORDER — DEXAMETHASONE 6 MG PO TABS: 6.0000 mg | ORAL_TABLET | Freq: Every day | ORAL | 0 refills | Status: DC

## 2019-06-12 NOTE — Discharge Summary (Addendum)
Physician Discharge Summary  [name] [name]:[id number] DOB: 1950-07-12 DOA: 06/10/2019  PCP: [name], [name], MD  Admit date: 06/10/2019 Discharge date: 06/12/2019  Admitted From: Home Disposition: Home  Recommendations for Outpatient Follow-up:  1. Follow up with PCP in 1-2 weeks 2. Please obtain BMP/CBC in one week your next doctors visit.  3. Decadron provider prescribed 4. Outpatient remdesivir infusion scheduled on 1/26 and 06/14/2019   Discharge Condition: Stable CODE STATUS: Full Diet recommendation: Diabetic/renal  Brief/Interim Summary: 69 year old with history of HTN, ESRD on PD, DM 2 with peripheral neuropathy, OA, chronic anemia, functional diarrhea presented to the hospital with history of 2 days of fall due to weakness and was tested positive for COVID-19 on 1/20.  In the ER was desaturating down to 80% with ambulation.  Chest x-ray was clean.  CT head was negative.  He was started on Decadron and remdesivir which he tolerated well during the hospitalization.  He was seen by physical therapy the following day and did well with them.  He was eventually weaned off oxygen and deemed stable to be discharged.  Outpatient infusion for remdesivir was arranged. Today he is medically stable to be discharged. PD cultures reviewed with Dr. Justin Mend, no concerns and no treatment required as he does not have any abdominal pain or signs of other infection.  Generalized weakness with falls, resolved Acute hypoxia secondary to COVID-19 pneumonia -Oxygen levels-room air at rest, 2 L with ambulation -Remdesivir-D3.  D3 and D4 infusion arranged as outpatient -transition to p.o. Decadron.  Prescription given -PT/OT-no follow-up needed. -CT head-negative  Vitamin D deficiency -Supplements ordered  Hypokalemia -Repletion as necessary  ESRD on peritoneal dialysis -Nephrology team consulted  Anemia of chronic disease -Hemoglobin at baseline of 11.7.  Elevated  troponin -Perhaps slight demand ischemia.  Troponins are flat, he is chest pain-free.  EKG appears to be nonischemic  Essential hypertension -Currently due to soft blood pressure, home regimen is on hold  Diabetes mellitus type 2, controlled on diet -Insulin sliding scale and Accu-Chek.  Resume home care  Peritoneal cultures positive for Staphylococcus-no obvious evidence of active infection, abdominal pain.  Discussed with nephrology- Dr Justin Mend, no treatment required.  Discharge Diagnoses:  Principal Problem:   COVID-19 virus infection Active Problems:   Diabetes mellitus type 2, controlled ([place])   ESRD on peritoneal dialysis ([place])   Hypokalemia   Fall at home, initial encounter   Syncope   Elevated troponin    Consultations:  Nephrology  Subjective: Feels better, no complaints.  Wishes to go home.  Discharge Exam: Vitals:   06/11/19 2030 06/12/19 0541  BP: 139/90 139/79  Pulse: 87 80  Resp: (!) 22 16  Temp: 98.4 F (36.9 C) 98.2 F (36.8 C)  SpO2: 91% 96%   Vitals:   06/11/19 1600 06/11/19 2000 06/11/19 2030 06/12/19 0541  BP: (!) 165/92 (!) 128/103 139/90 139/79  Pulse: 71 78 87 80  Resp: 13 16 (!) 22 16  Temp:   98.4 F (36.9 C) 98.2 F (36.8 C)  TempSrc:  Oral Oral Oral  SpO2: 96% 100% 91% 96%  Weight:  68.9 kg    Height:        General: Pt is alert, awake, not in acute distress Cardiovascular: RRR, S1/S2 +, no rubs, no gallops Respiratory: CTA bilaterally, no wheezing, no rhonchi Abdominal: Soft, NT, ND, bowel sounds + Extremities: no edema, no cyanosis  Discharge Instructions  Discharge Instructions    MyChart COVID-19 home monitoring program  Complete by: Jun 12, 2019    Is the patient willing to use the [facility] for home monitoring?: Yes   Temperature monitoring   Complete by: Jun 12, 2019    After how many days would you like to receive a notification of this patient's flowsheet entries?: 1     Allergies as of 06/12/2019       Reactions   Other Rash   Oxyquinoline-white Pet-lanolin   No Known Allergies    Bag Balm  [albolene] Rash      Medication List    TAKE these medications   albuterol 108 (90 Base) MCG/ACT inhaler Commonly known as: VENTOLIN HFA Inhale 2 puffs into the lungs every 6 (six) hours as needed for wheezing or shortness of breath.   amLODipine 5 MG tablet Commonly known as: NORVASC Take 5 mg by mouth at bedtime.   aspirin 81 MG tablet Take 1 tablet (81 mg total) by mouth daily.   calcitRIOL 0.5 MCG capsule Commonly known as: ROCALTROL Take by mouth.   dexamethasone 6 MG tablet Commonly known as: DECADRON Take 1 tablet (6 mg total) by mouth daily. Start taking on: June 13, 2019   heparin flush 10 UNIT/ML Soln injection 50 Units See admin instructions. In dialysis bag   Rena-Vite Rx 1 MG Tabs Take 1 mg by mouth See admin instructions. Take 1mg  daily on dialysis days. Take after treatment.   sevelamer carbonate 800 MG tablet Commonly known as: RENVELA Take 1-3 tablets by mouth See admin instructions. Take 3 tablets by mouth three times daily with meals and 1 tablet twice daily with snacks.   telmisartan 40 MG tablet Commonly known as: MICARDIS Take 40 mg by mouth daily.   torsemide 100 MG tablet Commonly known as: DEMADEX Take 100 mg by mouth daily.   UNABLE TO FIND TAKE 1 TABLET BY MOUTH EVERY DAY (ON DIALYSIS DAYS, TAKE AFTER DIALYSIS TREATMENT)   Vitamin D3 25 MCG tablet Commonly known as: Vitamin D Take 1 tablet (1,000 Units total) by mouth daily. Start taking on: June 13, 2019      Follow-up Information    [name], [name], MD. Schedule an appointment as soon as possible for a visit in 2 week(s).   Specialty: Internal Medicine Contact information: [place] [address] [phone]          Allergies  Allergen Reactions  . Other Rash    Oxyquinoline-white Pet-lanolin  . No Known Allergies   . Bag Balm   [Albolene] Rash    You were cared for by a hospitalist during your hospital stay. If you have any questions about your discharge medications or the care you received while you were in the hospital after you are discharged, you can call the unit and asked to speak with the hospitalist on call if the hospitalist that took care of you is not available. Once you are discharged, your primary care physician will handle any further medical issues. Please note that no refills for any discharge medications will be authorized once you are discharged, as it is imperative that you return to your primary care physician (or establish a relationship with a primary care physician if you do not have one) for your aftercare needs so that they can reassess your need for medications and monitor your lab values.   Procedures/Studies: CT HEAD WO CONTRAST  Result Date: 06/10/2019 CLINICAL DATA:  Ataxia, multiple falls EXAM: CT HEAD WITHOUT CONTRAST TECHNIQUE: Contiguous axial images  were obtained from the base of the skull through the vertex without intravenous contrast. COMPARISON:  07/28/2012 FINDINGS: Brain: No evidence of acute infarction, hemorrhage, hydrocephalus, extra-axial collection or mass lesion/mass effect. Subcortical white matter and periventricular small vessel ischemic changes. Vascular: Intracranial atherosclerosis. Skull: Normal. Negative for fracture or focal lesion. Sinuses/Orbits: The visualized paranasal sinuses are essentially clear. The mastoid air cells are unopacified. Other: None. IMPRESSION: No evidence of acute intracranial abnormality. Small vessel ischemic changes. Electronically Signed   By: Julian Hy M.D.   On: 06/10/2019 08:18   DG Chest Portable 1 View  Result Date: 06/10/2019 CLINICAL DATA:  COVID-19.  Cough EXAM: PORTABLE CHEST 1 VIEW COMPARISON:  06/05/2016 FINDINGS: The heart size and mediastinal contours are within normal limits. Both lungs are clear. The visualized skeletal  structures are unremarkable. IMPRESSION: No active disease. Electronically Signed   By: Ulyses Jarred M.D.   On: 06/10/2019 04:06     The results of significant diagnostics from this hospitalization (including imaging, microbiology, ancillary and laboratory) are listed below for reference.     Microbiology: Recent Results (from the past 240 hour(s))  Novel Coronavirus, NAA ([facility])     Status: Abnormal   Collection Time: 06/07/19 10:07 AM   Specimen: Nasopharyngeal(NP) swabs in vial transport medium   NASOPHARYNGE  TESTING  Result Value Ref Range Status   SARS-CoV-2, NAA Detected (A) Not Detected Final    Comment: This nucleic acid amplification test was developed and its performance characteristics determined by [employer]. Nucleic acid amplification tests include RT-PCR and TMA. This test has not been FDA cleared or approved. This test has been authorized by FDA under an Emergency Use Authorization (EUA). This test is only authorized for the duration of time the declaration that circumstances exist justifying the authorization of the emergency use of in vitro diagnostic tests for detection of SARS-CoV-2 virus and/or diagnosis of COVID-19 infection under section 564(b)(1) of the Act, 21 U.S.C. 403KVQ-2(V) (1), unless the authorization is terminated or revoked sooner. When diagnostic testing is negative, the possibility of a false negative result should be considered in the context of a patient's recent exposures and the presence of clinical signs and symptoms consistent with COVID-19. An individual without symptoms of COVID-19 and who is not shedding SARS-CoV-2 virus wo uld expect to have a negative (not detected) result in this assay.   Blood Culture (routine x 2)     Status: None (Preliminary result)   Collection Time: 06/10/19  7:16 AM   Specimen: BLOOD LEFT HAND  Result Value Ref Range Status   Specimen Description BLOOD LEFT HAND  Final   Special Requests    Final    BOTTLES DRAWN AEROBIC AND ANAEROBIC Blood Culture results may not be optimal due to an inadequate volume of blood received in culture bottles   Culture   Final    NO GROWTH 2 DAYS Performed at [facility], [place] [address], [place], [place] [address]    Report Status PENDING  Incomplete  Blood Culture (routine x 2)     Status: None (Preliminary result)   Collection Time: 06/10/19  8:16 AM   Specimen: BLOOD  Result Value Ref Range Status   Specimen Description BLOOD LEFT ANTECUBITAL  Final   Special Requests   Final    BOTTLES DRAWN AEROBIC AND ANAEROBIC Blood Culture results may not be optimal due to an excessive volume of blood received in culture bottles   Culture   Final    NO  GROWTH 2 DAYS Performed at [facility], [place] [address], [place], [place] [address]    Report Status PENDING  Incomplete  Culture, body fluid-bottle     Status: None (Preliminary result)   Collection Time: 06/10/19  8:26 PM   Specimen: Peritoneal Dialysate  Result Value Ref Range Status   Specimen Description PERITONEAL DIALYSATE  Final   Special Requests   Final    BOTTLES DRAWN AEROBIC AND ANAEROBIC Blood Culture adequate volume   Gram Stain   Final    GRAM POSITIVE COCCI IN BOTH AEROBIC AND ANAEROBIC BOTTLES CRITICAL RESULT CALLED TO, READ BACK BY AND VERIFIED WITH: S. MAFAT, RN AT 2120 ON 06/11/19 BY C. JESSUP, MT.    Culture   Final    CULTURE REINCUBATED FOR BETTER GROWTH Performed at [facility], [place] [address] [place], [place] [address]    Report Status PENDING  Incomplete  Gram stain     Status: None   Collection Time: 06/10/19  8:26 PM   Specimen: Peritoneal Dialysate  Result Value Ref Range Status   Specimen Description PERITONEAL DIALYSATE FLUID  Final   Special Requests NONE  Final   Gram Stain   Final    NO WBC SEEN NO ORGANISMS SEEN Performed at [facility], [address], [place], [place] [address]    Report Status 06/11/2019 FINAL  Final  C  difficile quick scan w PCR reflex     Status: None   Collection Time: 06/10/19  8:40 PM   Specimen: STOOL  Result Value Ref Range Status   C Diff antigen NEGATIVE NEGATIVE Final   C Diff toxin NEGATIVE NEGATIVE Final   C Diff interpretation No C. difficile detected.  Final    Comment: Performed at [facility], [place] [address], [place],  [address]     Labs: BNP (last 3 results) Recent Labs    06/10/19 0740  BNP 70.1   Basic Metabolic Panel: Recent Labs  Lab 06/10/19 0241 06/11/19 0239 06/12/19 0605  NA 135 134* 134*  K 3.2* 4.4 4.1  CL 96* 97* 94*  CO2 24 23 22   GLUCOSE 140* 279* 241*  BUN 45* 54* 66*  CREATININE 11.80* 12.01* 12.84*  CALCIUM 8.4* 8.2* 8.4*  MG  --  2.1 2.2  PHOS  --  6.4* 7.7*   Liver Function Tests: Recent Labs  Lab 06/10/19 0241 06/11/19 0239 06/12/19 0605  AST 15 18 17   ALT 13 14 17   ALKPHOS 41 37* 39  BILITOT 0.7 0.3 0.8  PROT 7.3 6.4* 6.5  ALBUMIN 2.8* 2.2* 2.5*   No results for input(s): LIPASE, AMYLASE in the last 168 hours. No results for input(s): AMMONIA in the last 168 hours. CBC: Recent Labs  Lab 06/10/19 0241 06/10/19 0740 06/11/19 0239 06/12/19 0605  WBC 9.3 7.8 5.2 8.5  NEUTROABS 6.1 4.9 4.1 6.9  HGB 11.7* 10.7* 10.7* 10.9*  HCT 37.1* 32.8* 32.7* 32.0*  MCV 92.5 90.4 89.1 85.3  PLT 237 211 244 280   Cardiac Enzymes: No results for input(s): CKTOTAL, CKMB, CKMBINDEX, TROPONINI in the last 168 hours. BNP: Invalid input(s): POCBNP CBG: Recent Labs  Lab 06/11/19 0909 06/11/19 1158 06/11/19 1606 06/11/19 2114 06/12/19 0733  GLUCAP 166* 98 153* 215* 210*   D-Dimer Recent Labs    06/11/19 0239 06/12/19 0605  DDIMER 0.61* 0.50   Hgb A1c No results for input(s): HGBA1C in the last 72 hours. Lipid Profile Recent Labs  06/10/19 0740  TRIG 223*   Thyroid function studies No results for input(s): TSH, T4TOTAL, T3FREE, THYROIDAB in the last 72 hours.  Invalid input(s): FREET3 Anemia work  up Recent Labs    06/11/19 0239 06/12/19 0605  FERRITIN 1,382* 1,761*   Urinalysis    Component Value Date/Time   COLORURINE YELLOW 07/31/2014 2130   APPEARANCEUR CLOUDY (A) 07/31/2014 2130   LABSPEC 1.034 (H) 07/31/2014 2130   PHURINE 5.0 07/31/2014 2130   GLUCOSEU >1000 (A) 07/31/2014 2130   HGBUR TRACE (A) 07/31/2014 2130   HGBUR trace-lysed 06/16/2010 0833   BILIRUBINUR n 05/22/2015 0927   KETONESUR NEGATIVE 07/31/2014 2130   PROTEINUR 3+ 05/22/2015 0927   PROTEINUR 100 (A) 07/31/2014 2130   UROBILINOGEN 0.2 05/22/2015 0927   UROBILINOGEN 0.2 07/31/2014 2130   NITRITE n 05/22/2015 0927   NITRITE NEGATIVE 07/31/2014 2130   LEUKOCYTESUR Negative 05/22/2015 0927   Sepsis Labs Invalid input(s): PROCALCITONIN,  WBC,  LACTICIDVEN Microbiology Recent Results (from the past 240 hour(s))  Novel Coronavirus, NAA ([facility])     Status: Abnormal   Collection Time: 06/07/19 10:07 AM   Specimen: Nasopharyngeal(NP) swabs in vial transport medium   NASOPHARYNGE  TESTING  Result Value Ref Range Status   SARS-CoV-2, NAA Detected (A) Not Detected Final    Comment: This nucleic acid amplification test was developed and its performance characteristics determined by [employer]. Nucleic acid amplification tests include RT-PCR and TMA. This test has not been FDA cleared or approved. This test has been authorized by FDA under an Emergency Use Authorization (EUA). This test is only authorized for the duration of time the declaration that circumstances exist justifying the authorization of the emergency use of in vitro diagnostic tests for detection of SARS-CoV-2 virus and/or diagnosis of COVID-19 infection under section 564(b)(1) of the Act, 21 U.S.C. 811BJY-7(W) (1), unless the authorization is terminated or revoked sooner. When diagnostic testing is negative, the possibility of a false negative result should be considered in the context of a patient's recent exposures and the  presence of clinical signs and symptoms consistent with COVID-19. An individual without symptoms of COVID-19 and who is not shedding SARS-CoV-2 virus wo uld expect to have a negative (not detected) result in this assay.   Blood Culture (routine x 2)     Status: None (Preliminary result)   Collection Time: 06/10/19  7:16 AM   Specimen: BLOOD LEFT HAND  Result Value Ref Range Status   Specimen Description BLOOD LEFT HAND  Final   Special Requests   Final    BOTTLES DRAWN AEROBIC AND ANAEROBIC Blood Culture results may not be optimal due to an inadequate volume of blood received in culture bottles   Culture   Final    NO GROWTH 2 DAYS Performed at [facility], [place] [address] [place], [place] [address]    Report Status PENDING  Incomplete  Blood Culture (routine x 2)     Status: None (Preliminary result)   Collection Time: 06/10/19  8:16 AM   Specimen: BLOOD  Result Value Ref Range Status   Specimen Description BLOOD LEFT ANTECUBITAL  Final   Special Requests   Final    BOTTLES DRAWN AEROBIC AND ANAEROBIC Blood Culture results may not be optimal due to an excessive volume of blood received in culture bottles   Culture   Final    NO GROWTH 2 DAYS Performed at [facility], [place] [address], [place], [place] [address]    Report  Status PENDING  Incomplete  Culture, body fluid-bottle     Status: None (Preliminary result)   Collection Time: 06/10/19  8:26 PM   Specimen: Peritoneal Dialysate  Result Value Ref Range Status   Specimen Description PERITONEAL DIALYSATE  Final   Special Requests   Final    BOTTLES DRAWN AEROBIC AND ANAEROBIC Blood Culture adequate volume   Gram Stain   Final    GRAM POSITIVE COCCI IN BOTH AEROBIC AND ANAEROBIC BOTTLES CRITICAL RESULT CALLED TO, READ BACK BY AND VERIFIED WITH: S. MAFAT, RN AT 2120 ON 06/11/19 BY C. JESSUP, MT.    Culture   Final    CULTURE REINCUBATED FOR BETTER GROWTH Performed at [facility], [place] [address]  [place], [place] [address]    Report Status PENDING  Incomplete  Gram stain     Status: None   Collection Time: 06/10/19  8:26 PM   Specimen: Peritoneal Dialysate  Result Value Ref Range Status   Specimen Description PERITONEAL DIALYSATE FLUID  Final   Special Requests NONE  Final   Gram Stain   Final    NO WBC SEEN NO ORGANISMS SEEN Performed at [facility], [address] [place], [place] [address]    Report Status 06/11/2019 FINAL  Final  C difficile quick scan w PCR reflex     Status: None   Collection Time: 06/10/19  8:40 PM   Specimen: STOOL  Result Value Ref Range Status   C Diff antigen NEGATIVE NEGATIVE Final   C Diff toxin NEGATIVE NEGATIVE Final   C Diff interpretation No C. difficile detected.  Final    Comment: Performed at [facility], [place] [address], [place], [place] [address]     Time coordinating discharge:  I have spent 35 minutes face to face with the patient and on the ward discussing the patients care, assessment, plan and disposition with other care givers. >50% of the time was devoted counseling the patient about the risks and benefits of treatment/Discharge disposition and coordinating care.   SIGNED:   Damita Lack, MD  [facility] 06/12/2019, 11:02 AM   If 7PM-7AM, please contact night-coverage

## 2019-06-12 NOTE — Progress Notes (Signed)
Patient scheduled for outpatient Remdesivir infusion at 530PM on Tuesday 1/26 and Wednesday 1/27. Please advise them to report to [facility] at [address].  Drive to the security guard and tell them you are here for an infusion. They will direct you to the front entrance where we will come and get you.  For questions call [phone].  Thanks

## 2019-06-12 NOTE — Evaluation (Signed)
Occupational Therapy Evaluation Patient Details Name: [name] [name] MRN: [id number] DOB: [birth year] Today's Date: 06/12/2019    History of Present Illness 69 year old with history of HTN, ESRD on PD, DM 2 with peripheral neuropathy, OA, chronic anemia, functional diarrhea presented to the hospital with history of 2 days of fall due to weakness and was tested positive for COVID-19 on 1/20.  O2 saturation maintained on room air while at rest. With ambulation patient's O2 saturations were noted to drop into the 80s.   Clinical Impression   This 69 y/o male presents with the above. PTA pt reports independence with ADL, iADL and functional mobility. Pt currently demonstrating functional transfers, marching in place without AD at supervision level (distance limited due to lines - hooked up to PD dialysis machine), pt tolerated marching and static standing >5 min while talking to therapist during session, tolerating well. BP in standing 125/73 and pt denies dizziness throughout, SpO2 >94% on RA. Pt reports plans to return home with assist from daughter and spouse PRN. He will benefit from continued OT services while in acute setting to maximize his safety and independence with ADL and mobility. Do not anticipate pt will require follow up OT services.     Follow Up Recommendations  No OT follow up;Supervision - Intermittent    Equipment Recommendations  None recommended by OT           Precautions / Restrictions Precautions Precautions: Other (comment) Precaution Comments: syncope/falls; monitor sats Restrictions Weight Bearing Restrictions: No      Mobility Bed Mobility Overal bed mobility: Modified Independent                Transfers Overall transfer level: Needs assistance Equipment used: None Transfers: Sit to/from Stand Sit to Stand: Supervision         General transfer comment: supervision for safety, denies dizziness     Balance Overall balance assessment: No  apparent balance deficits (not formally assessed)                                         ADL either performed or assessed with clinical judgement   ADL Overall ADL's : Needs assistance/impaired Eating/Feeding: Independent;Sitting   Grooming: Supervision/safety;Standing   Upper Body Bathing: Modified independent;Sitting   Lower Body Bathing: Supervison/ safety;Sit to/from stand   Upper Body Dressing : Modified independent;Sitting   Lower Body Dressing: Supervision/safety;Sit to/from stand   Toilet Transfer: Supervision/safety;Ambulation Toilet Transfer Details (indicate cue type and reason): simulated via tranfer to/from EOB, marching in place around EOB (limited due to lines) Toileting- Clothing Manipulation and Hygiene: Supervision/safety;Sit to/from Nurse, children's Details (indicate cue type and reason): discussed having shower chair in tub for use if pt needs it  Functional mobility during ADLs: Supervision/safety;Min guard General ADL Comments: edcuated pt in energy conservation strategies and activity progression after return home                         Pertinent Vitals/Pain Pain Assessment: No/denies pain     Hand Dominance Right   Extremity/Trunk Assessment Upper Extremity Assessment Upper Extremity Assessment: Overall WFL for tasks assessed   Lower Extremity Assessment Lower Extremity Assessment: Defer to PT evaluation   Cervical / Trunk Assessment Cervical / Trunk Assessment: Normal   Communication Communication Communication: No difficulties   Cognition Arousal/Alertness: Awake/alert Behavior During Therapy:  WFL for tasks assessed/performed Overall Cognitive Status: Within Functional Limits for tasks assessed                                     General Comments       Exercises     Shoulder Instructions      Home Living Family/patient expects to be discharged to:: Private residence Living  Arrangements: Spouse/significant other;Children(daughter; 2 grandaughters) Available Help at Discharge: Family;Available 24 hours/day(all tested positive) Type of Home: House Home Access: Stairs to enter [employer] of Steps: 2 Entrance Stairs-Rails: Right;Left Home Layout: One level     Bathroom Shower/Tub: Teacher, early years/pre: Standard     Home Equipment: Clinical cytogeneticist - 2 wheels          Prior Functioning/Environment Level of Independence: Independent        Comments: drives, works        OT Problem List: Decreased strength;Decreased activity tolerance;Cardiopulmonary status limiting activity;Decreased knowledge of use of DME or AE      OT Treatment/Interventions: Self-care/ADL training;Therapeutic exercise;Energy conservation;DME and/or AE instruction;Therapeutic activities;Patient/family education;Balance training    OT Goals(Current goals can be found in the care plan section) Acute Rehab OT Goals Patient Stated Goal: regain his strength OT Goal Formulation: With patient Time For Goal Achievement: 06/26/19 Potential to Achieve Goals: Good  OT Frequency: Min 2X/week   Barriers to D/C:            Co-evaluation              AM-PAC OT "6 Clicks" Daily Activity     Outcome Measure Help from another person eating meals?: None Help from another person taking care of personal grooming?: None Help from another person toileting, which includes using toliet, bedpan, or urinal?: None Help from another person bathing (including washing, rinsing, drying)?: A Little Help from another person to put on and taking off regular upper body clothing?: None Help from another person to put on and taking off regular lower body clothing?: A Little 6 Click Score: 22   End of Session Nurse Communication: Mobility status  Activity Tolerance: Patient tolerated treatment well Patient left: in bed;with call bell/phone within reach  OT Visit  Diagnosis: Other abnormalities of gait and mobility (R26.89)                Time: [id number] OT Time Calculation (min): 15 min Charges:  OT General Charges $OT Visit: 1 Visit OT Evaluation $OT Eval Moderate Complexity: 1 Mod  Lou Cal, OT [employer] Pager [phone] Office [phone]  Raymondo Band 06/12/2019, 1:37 PM

## 2019-06-12 NOTE — Discharge Instructions (Addendum)
You are scheduled for an outpatient infusion of Remdesivir at 530PM on Tuesday 1/26 and Wednesday 1/27.  Please report [facility] at [address]  Drive to the security guard and tell them you are here for an infusion. They will direct you to the front entrance where we will come and get you.  For questions call [phone].  Thanks       Person Under Monitoring Name: [name] [name]  Location: [place] [address]   Infection Prevention Recommendations for Individuals Confirmed to have, or Being Evaluated for, 2019 Novel Coronavirus (COVID-19) Infection Who Receive Care at Home  Individuals who are confirmed to have, or are being evaluated for, COVID-19 should follow the prevention steps below until a healthcare provider or local or state health department says they can return to normal activities.  Stay home except to get medical care You should restrict activities outside your home, except for getting medical care. Do not go to work, school, or public areas, and do not use public transportation or taxis.  Call ahead before visiting your doctor Before your medical appointment, call the healthcare provider and tell them that you have, or are being evaluated for, COVID-19 infection. This will help the healthcare provider's office take steps to keep other people from getting infected. Ask your healthcare provider to call the local or state health department.  Monitor your symptoms Seek prompt medical attention if your illness is worsening (e.g., difficulty breathing). Before going to your medical appointment, call the healthcare provider and tell them that you have, or are being evaluated for, COVID-19 infection. Ask your healthcare provider to call the local or state health department.  Wear a facemask You should wear a facemask that covers your nose and mouth when you are in the same room with other people and when you visit a healthcare  provider. People who live with or visit you should also wear a facemask while they are in the same room with you.  Separate yourself from other people in your home As much as possible, you should stay in a different room from other people in your home. Also, you should use a separate bathroom, if available.  Avoid sharing household items You should not share dishes, drinking glasses, cups, eating utensils, towels, bedding, or other items with other people in your home. After using these items, you should wash them thoroughly with soap and water.  Cover your coughs and sneezes Cover your mouth and nose with a tissue when you cough or sneeze, or you can cough or sneeze into your sleeve. Throw used tissues in a lined trash can, and immediately wash your hands with soap and water for at least 20 seconds or use an alcohol-based hand rub.  Wash your [facility] your hands often and thoroughly with soap and water for at least 20 seconds. You can use an alcohol-based hand sanitizer if soap and water are not available and if your hands are not visibly dirty. Avoid touching your eyes, nose, and mouth with unwashed hands.   Prevention Steps for Caregivers and Household Members of Individuals Confirmed to have, or Being Evaluated for, COVID-19 Infection Being Cared for in the Home  If you live with, or provide care at home for, a person confirmed to have, or being evaluated for, COVID-19 infection please follow these guidelines to prevent infection:  Follow healthcare provider's instructions Make sure that you understand and can help the patient follow any healthcare provider  instructions for all care.  Provide for the patient's basic needs You should help the patient with basic needs in the home and provide support for getting groceries, prescriptions, and other personal needs.  Monitor the patient's symptoms If they are getting sicker, call his or her medical provider and tell them that the  patient has, or is being evaluated for, COVID-19 infection. This will help the healthcare provider's office take steps to keep other people from getting infected. Ask the healthcare provider to call the local or state health department.  Limit the number of people who have contact with the patient  If possible, have only one caregiver for the patient.  Other household members should stay in another home or place of residence. If this is not possible, they should stay  in another room, or be separated from the patient as much as possible. Use a separate bathroom, if available.  Restrict visitors who do not have an essential need to be in the home.  Keep older adults, very young children, and other sick people away from the patient Keep older adults, very young children, and those who have compromised immune systems or chronic health conditions away from the patient. This includes people with chronic heart, lung, or kidney conditions, diabetes, and cancer.  Ensure good ventilation Make sure that shared spaces in the home have good air flow, such as from an air conditioner or an opened window, weather permitting.  Wash your hands often  Wash your hands often and thoroughly with soap and water for at least 20 seconds. You can use an alcohol based hand sanitizer if soap and water are not available and if your hands are not visibly dirty.  Avoid touching your eyes, nose, and mouth with unwashed hands.  Use disposable paper towels to dry your hands. If not available, use dedicated cloth towels and replace them when they become wet.  Wear a facemask and gloves  Wear a disposable facemask at all times in the room and gloves when you touch or have contact with the patient's blood, body fluids, and/or secretions or excretions, such as sweat, saliva, sputum, nasal mucus, vomit, urine, or feces.  Ensure the mask fits over your nose and mouth tightly, and do not touch it during use.  Throw out  disposable facemasks and gloves after using them. Do not reuse.  Wash your hands immediately after removing your facemask and gloves.  If your personal clothing becomes contaminated, carefully remove clothing and launder. Wash your hands after handling contaminated clothing.  Place all used disposable facemasks, gloves, and other waste in a lined container before disposing them with other household waste.  Remove gloves and wash your hands immediately after handling these items.  Do not share dishes, glasses, or other household items with the patient  Avoid sharing household items. You should not share dishes, drinking glasses, cups, eating utensils, towels, bedding, or other items with a patient who is confirmed to have, or being evaluated for, COVID-19 infection.  After the person uses these items, you should wash them thoroughly with soap and water.  Wash laundry thoroughly  Immediately remove and wash clothes or bedding that have blood, body fluids, and/or secretions or excretions, such as sweat, saliva, sputum, nasal mucus, vomit, urine, or feces, on them.  Wear gloves when handling laundry from the patient.  Read and follow directions on labels of laundry or clothing items and detergent. In general, wash and dry with the warmest temperatures recommended on the label.  Clean all areas the individual has used often  Clean all touchable surfaces, such as counters, tabletops, doorknobs, bathroom fixtures, toilets, phones, keyboards, tablets, and bedside tables, every day. Also, clean any surfaces that may have blood, body fluids, and/or secretions or excretions on them.  Wear gloves when cleaning surfaces the patient has come in contact with.  Use a diluted bleach solution (e.g., dilute bleach with 1 part bleach and 10 parts water) or a household disinfectant with a label that says EPA-registered for coronaviruses. To make a bleach solution at home, add 1 tablespoon of bleach to 1  quart (4 cups) of water. For a larger supply, add  cup of bleach to 1 gallon (16 cups) of water.  Read labels of cleaning products and follow recommendations provided on product labels. Labels contain instructions for safe and effective use of the cleaning product including precautions you should take when applying the product, such as wearing gloves or eye protection and making sure you have good ventilation during use of the product.  Remove gloves and wash hands immediately after cleaning.  Monitor yourself for signs and symptoms of illness Caregivers and household members are considered close contacts, should monitor their health, and will be asked to limit movement outside of the home to the extent possible. Follow the monitoring steps for close contacts listed on the symptom monitoring form.   ? If you have additional questions, contact your local health department or call the epidemiologist on call at [phone] (available 24/7). ? This guidance is subject to change. For the most up-to-date guidance from [facility], please refer to their website: [email or web address]

## 2019-06-12 NOTE — Progress Notes (Signed)
[name] [name] to be D/C'd home per MD order. Discussed with the patient and all questions fully answered. VVS, Skin clean, dry and intact without evidence of skin break down, no evidence of skin tears noted. Patient able to demonstrate proper use of inhaler. IV catheter discontinued intact. Site without signs and symptoms of complications. Dressing and pressure applied.  An After Visit Summary was printed and given to the patient.  Patient escorted via [place], and D/C home via private auto.  [name]  06/12/2019 4:00 PM

## 2019-06-12 NOTE — Progress Notes (Signed)
OT Cancellation Note  Patient Details Name: OSMOND STECKMAN MRN: [id number] DOB: [birth year]   Cancelled Treatment:    Reason Eval/Treat Not Completed: Patient at procedure or test/ unavailable; checked on pt x2 this AM, still remains on PD dialysis machine and waiting to be removed from machine, will follow up for OT eval as able.  Lou Cal, OT [facility] Pager [phone] Office [phone]   Raymondo Band 06/12/2019, 11:18 AM

## 2019-06-12 NOTE — Care Management (Signed)
CM spoke with pt prior to discharge.  Pt informed CM that his wife will transport home via private vehicle.  Pt confirms he is on PD dialysis and has all the supplies that he needs.  Pt denies concerns with discharging today back to his home setting.  Discharge order written - CM signing off

## 2019-06-12 NOTE — Progress Notes (Signed)
SATURATION QUALIFICATIONS: (This note is used to comply with regulatory documentation for home oxygen)  Patient Saturations on Room Air at Rest = 100%  Patient Saturations on Room Air while Ambulating = 98% 

## 2019-06-13 ENCOUNTER — Ambulatory Visit (HOSPITAL_COMMUNITY)
Admission: RE | Admit: 2019-06-13 | Discharge: 2019-06-13 | Disposition: A | Discharge status: Home / Self Care | Payer: BC Managed Care – PPO | Source: Ambulatory Visit | Attending: Pulmonary Disease | Admitting: Pulmonary Disease

## 2019-06-13 DIAGNOSIS — U071 COVID-19: Secondary | ICD-10-CM | POA: Insufficient documentation

## 2019-06-13 LAB — GI PATHOGEN PANEL BY PCR, STOOL

## 2019-06-13 LAB — CULTURE, BODY FLUID W GRAM STAIN -BOTTLE: Special Requests: ADEQUATE

## 2019-06-13 MED ORDER — SODIUM CHLORIDE 0.9 % IV SOLN
100.0000 mg | Freq: Once | INTRAVENOUS | Status: AC
  Administered 2019-06-13: 100 mg via INTRAVENOUS

## 2019-06-13 MED ORDER — ALBUTEROL SULFATE HFA 108 (90 BASE) MCG/ACT IN AERS: 2.0000 | INHALATION_SPRAY | Freq: Once | RESPIRATORY_TRACT | Status: DC | PRN

## 2019-06-13 MED ORDER — EPINEPHRINE 0.3 MG/0.3ML IJ SOAJ: 0.3000 mg | Freq: Once | INTRAMUSCULAR | Status: DC | PRN

## 2019-06-13 MED ORDER — SODIUM CHLORIDE 0.9 % IV SOLN: INTRAVENOUS | Status: DC | PRN

## 2019-06-13 MED ORDER — METHYLPREDNISOLONE SODIUM SUCC 125 MG IJ SOLR: 125.0000 mg | Freq: Once | INTRAMUSCULAR | Status: DC | PRN

## 2019-06-13 MED ORDER — DIPHENHYDRAMINE HCL 50 MG/ML IJ SOLN: 50.0000 mg | Freq: Once | INTRAMUSCULAR | Status: DC | PRN

## 2019-06-13 MED ORDER — FAMOTIDINE IN NACL 20-0.9 MG/50ML-% IV SOLN: 20.0000 mg | Freq: Once | INTRAVENOUS | Status: DC | PRN

## 2019-06-13 MED ORDER — SODIUM CHLORIDE 0.9 % IV SOLN
INTRAVENOUS | Status: AC
  Filled 2019-06-13: qty 20

## 2019-06-13 MED ORDER — SODIUM CHLORIDE 0.9 % IV SOLN: 100.0000 mg | Freq: Once | INTRAVENOUS | Status: DC

## 2019-06-13 NOTE — Progress Notes (Signed)
  Diagnosis: COVID-19  Physician:Dr Joya Gaskins Procedure: [facility] Med: remdesivir infusion.  Complications: No immediate complications noted.  Discharge: Discharged home   [name] [name] 06/13/2019

## 2019-06-14 ENCOUNTER — Encounter (HOSPITAL_COMMUNITY): Payer: Self-pay | Admitting: Internal Medicine

## 2019-06-14 ENCOUNTER — Observation Stay (HOSPITAL_COMMUNITY): Payer: Medicare Other

## 2019-06-14 ENCOUNTER — Ambulatory Visit (HOSPITAL_COMMUNITY): Payer: BC Managed Care – PPO

## 2019-06-14 ENCOUNTER — Emergency Department (HOSPITAL_COMMUNITY): Payer: Medicare Other

## 2019-06-14 ENCOUNTER — Inpatient Hospital Stay (HOSPITAL_COMMUNITY)
Admission: EM | Admit: 2019-06-14 | Discharge: 2019-06-16 | DRG: 377 | Disposition: A | Discharge status: Home / Self Care | Payer: Medicare Other | Attending: Internal Medicine | Admitting: Internal Medicine

## 2019-06-14 DIAGNOSIS — N186 End stage renal disease: Secondary | ICD-10-CM

## 2019-06-14 DIAGNOSIS — Z9181 History of falling: Secondary | ICD-10-CM

## 2019-06-14 DIAGNOSIS — I951 Orthostatic hypotension: Secondary | ICD-10-CM | POA: Diagnosis present

## 2019-06-14 DIAGNOSIS — U071 COVID-19: Secondary | ICD-10-CM | POA: Diagnosis present

## 2019-06-14 DIAGNOSIS — E1122 Type 2 diabetes mellitus with diabetic chronic kidney disease: Secondary | ICD-10-CM | POA: Diagnosis present

## 2019-06-14 DIAGNOSIS — E876 Hypokalemia: Secondary | ICD-10-CM | POA: Diagnosis present

## 2019-06-14 DIAGNOSIS — D631 Anemia in chronic kidney disease: Secondary | ICD-10-CM | POA: Diagnosis present

## 2019-06-14 DIAGNOSIS — N189 Chronic kidney disease, unspecified: Secondary | ICD-10-CM | POA: Diagnosis present

## 2019-06-14 DIAGNOSIS — K920 Hematemesis: Secondary | ICD-10-CM | POA: Diagnosis not present

## 2019-06-14 DIAGNOSIS — E118 Type 2 diabetes mellitus with unspecified complications: Secondary | ICD-10-CM

## 2019-06-14 DIAGNOSIS — R55 Syncope and collapse: Secondary | ICD-10-CM | POA: Diagnosis present

## 2019-06-14 DIAGNOSIS — D62 Acute posthemorrhagic anemia: Secondary | ICD-10-CM | POA: Diagnosis present

## 2019-06-14 DIAGNOSIS — N2581 Secondary hyperparathyroidism of renal origin: Secondary | ICD-10-CM | POA: Diagnosis present

## 2019-06-14 DIAGNOSIS — K922 Gastrointestinal hemorrhage, unspecified: Secondary | ICD-10-CM

## 2019-06-14 DIAGNOSIS — Z992 Dependence on renal dialysis: Secondary | ICD-10-CM

## 2019-06-14 DIAGNOSIS — D638 Anemia in other chronic diseases classified elsewhere: Secondary | ICD-10-CM

## 2019-06-14 DIAGNOSIS — J1282 Pneumonia due to coronavirus disease 2019: Secondary | ICD-10-CM | POA: Diagnosis present

## 2019-06-14 DIAGNOSIS — K591 Functional diarrhea: Secondary | ICD-10-CM | POA: Diagnosis present

## 2019-06-14 DIAGNOSIS — M1990 Unspecified osteoarthritis, unspecified site: Secondary | ICD-10-CM | POA: Diagnosis present

## 2019-06-14 DIAGNOSIS — E119 Type 2 diabetes mellitus without complications: Secondary | ICD-10-CM

## 2019-06-14 DIAGNOSIS — I120 Hypertensive chronic kidney disease with stage 5 chronic kidney disease or end stage renal disease: Secondary | ICD-10-CM | POA: Diagnosis present

## 2019-06-14 DIAGNOSIS — G9340 Encephalopathy, unspecified: Secondary | ICD-10-CM | POA: Diagnosis present

## 2019-06-14 DIAGNOSIS — T380X6A Underdosing of glucocorticoids and synthetic analogues, initial encounter: Secondary | ICD-10-CM | POA: Diagnosis present

## 2019-06-14 DIAGNOSIS — I10 Essential (primary) hypertension: Secondary | ICD-10-CM | POA: Diagnosis present

## 2019-06-14 DIAGNOSIS — E1142 Type 2 diabetes mellitus with diabetic polyneuropathy: Secondary | ICD-10-CM | POA: Diagnosis present

## 2019-06-14 DIAGNOSIS — E1165 Type 2 diabetes mellitus with hyperglycemia: Secondary | ICD-10-CM | POA: Diagnosis present

## 2019-06-14 LAB — TYPE AND SCREEN
ABO/RH(D): AB POS
Antibody Screen: NEGATIVE

## 2019-06-14 LAB — COMPREHENSIVE METABOLIC PANEL
ALT: 43 U/L (ref 0–44)
AST: 37 U/L (ref 15–41)
Albumin: 2.8 g/dL — ABNORMAL LOW (ref 3.5–5.0)
Alkaline Phosphatase: 40 U/L (ref 38–126)
Anion gap: 20 — ABNORMAL HIGH (ref 5–15)
BUN: 80 mg/dL — ABNORMAL HIGH (ref 8–23)
CO2: 22 mmol/L (ref 22–32)
Calcium: 8.2 mg/dL — ABNORMAL LOW (ref 8.9–10.3)
Chloride: 92 mmol/L — ABNORMAL LOW (ref 98–111)
Creatinine, Ser: 13.35 mg/dL — ABNORMAL HIGH (ref 0.61–1.24)
GFR calc Af Amer: 4 mL/min — ABNORMAL LOW (ref >60)
GFR calc non Af Amer: 3 mL/min — ABNORMAL LOW (ref >60)
Glucose, Bld: 272 mg/dL — ABNORMAL HIGH (ref 70–99)
Potassium: 3.2 mmol/L — ABNORMAL LOW (ref 3.5–5.1)
Sodium: 134 mmol/L — ABNORMAL LOW (ref 135–145)
Total Bilirubin: 0.3 mg/dL (ref 0.3–1.2)
Total Protein: 6.6 g/dL (ref 6.5–8.1)

## 2019-06-14 LAB — CBC WITH DIFFERENTIAL/PLATELET
Abs Immature Granulocytes: 0.11 10*3/uL — ABNORMAL HIGH (ref 0.00–0.07)
Basophils Absolute: 0 10*3/uL (ref 0.0–0.1)
Basophils Relative: 0 %
Eosinophils Absolute: 0 10*3/uL (ref 0.0–0.5)
Eosinophils Relative: 0 %
HCT: 32.4 % — ABNORMAL LOW (ref 39.0–52.0)
Hemoglobin: 11 g/dL — ABNORMAL LOW (ref 13.0–17.0)
Immature Granulocytes: 1 %
Lymphocytes Relative: 10 %
Lymphs Abs: 0.8 10*3/uL (ref 0.7–4.0)
MCH: 30.1 pg (ref 26.0–34.0)
MCHC: 34 g/dL (ref 30.0–36.0)
MCV: 88.5 fL (ref 80.0–100.0)
Monocytes Absolute: 0.6 10*3/uL (ref 0.1–1.0)
Monocytes Relative: 8 %
Neutro Abs: 6.2 10*3/uL (ref 1.7–7.7)
Neutrophils Relative %: 81 %
Platelets: 281 10*3/uL (ref 150–400)
RBC: 3.66 MIL/uL — ABNORMAL LOW (ref 4.22–5.81)
RDW: 12.5 % (ref 11.5–15.5)
WBC: 7.8 10*3/uL (ref 4.0–10.5)
nRBC: 0 % (ref 0.0–0.2)

## 2019-06-14 LAB — LACTIC ACID, PLASMA: Lactic Acid, Venous: 1.5 mmol/L (ref 0.5–1.9)

## 2019-06-14 LAB — AMMONIA: Ammonia: 18 umol/L (ref 9–35)

## 2019-06-14 LAB — PROTIME-INR
INR: 1.1 (ref 0.8–1.2)
Prothrombin Time: 14.2 seconds (ref 11.4–15.2)

## 2019-06-14 LAB — GLUCOSE, CAPILLARY: Glucose-Capillary: 140 mg/dL — ABNORMAL HIGH (ref 70–99)

## 2019-06-14 LAB — HEMOGLOBIN AND HEMATOCRIT, BLOOD
HCT: 32 % — ABNORMAL LOW (ref 39.0–52.0)
Hemoglobin: 10.9 g/dL — ABNORMAL LOW (ref 13.0–17.0)

## 2019-06-14 LAB — LIPASE, BLOOD: Lipase: 57 U/L — ABNORMAL HIGH (ref 11–51)

## 2019-06-14 LAB — POC OCCULT BLOOD, ED: Fecal Occult Bld: POSITIVE — AB

## 2019-06-14 MED ORDER — GUAIFENESIN-DM 100-10 MG/5ML PO SYRP
5.0000 mL | ORAL_SOLUTION | ORAL | Status: DC | PRN
  Administered 2019-06-14 – 2019-06-15 (×2): 5 mL via ORAL
  Filled 2019-06-14 (×2): qty 5

## 2019-06-14 MED ORDER — CALCITRIOL 0.25 MCG PO CAPS
0.5000 ug | ORAL_CAPSULE | Freq: Every day | ORAL | Status: DC
  Administered 2019-06-15 – 2019-06-16 (×2): 0.5 ug via ORAL
  Filled 2019-06-14: qty 1
  Filled 2019-06-14 (×2): qty 2

## 2019-06-14 MED ORDER — DEXAMETHASONE 6 MG PO TABS
6.0000 mg | ORAL_TABLET | Freq: Every day | ORAL | Status: DC
  Administered 2019-06-15 – 2019-06-16 (×2): 6 mg via ORAL
  Filled 2019-06-14 (×2): qty 1

## 2019-06-14 MED ORDER — SODIUM CHLORIDE 0.9% FLUSH
3.0000 mL | Freq: Two times a day (BID) | INTRAVENOUS | Status: DC
  Administered 2019-06-14 – 2019-06-16 (×4): 3 mL via INTRAVENOUS

## 2019-06-14 MED ORDER — SEVELAMER CARBONATE 800 MG PO TABS
2400.0000 mg | ORAL_TABLET | Freq: Three times a day (TID) | ORAL | Status: DC
  Administered 2019-06-15 – 2019-06-16 (×5): 2400 mg via ORAL
  Filled 2019-06-14 (×7): qty 3

## 2019-06-14 MED ORDER — SODIUM CHLORIDE 0.9 % IV SOLN
100.0000 mg | Freq: Once | INTRAVENOUS | Status: AC
  Administered 2019-06-14: 100 mg via INTRAVENOUS
  Filled 2019-06-14 (×2): qty 20

## 2019-06-14 MED ORDER — SODIUM CHLORIDE 0.9 % IV SOLN
2.0000 g | Freq: Once | INTRAVENOUS | Status: AC
  Administered 2019-06-14: 2 g via INTRAVENOUS
  Filled 2019-06-14: qty 20

## 2019-06-14 MED ORDER — INSULIN ASPART 100 UNIT/ML ~~LOC~~ SOLN
0.0000 [IU] | Freq: Every day | SUBCUTANEOUS | Status: DC
  Administered 2019-06-15: 2 [IU] via SUBCUTANEOUS

## 2019-06-14 MED ORDER — PANTOPRAZOLE SODIUM 40 MG PO TBEC: 40.0000 mg | DELAYED_RELEASE_TABLET | Freq: Two times a day (BID) | ORAL | Status: DC

## 2019-06-14 MED ORDER — SEVELAMER CARBONATE 800 MG PO TABS
800.0000 mg | ORAL_TABLET | Freq: Two times a day (BID) | ORAL | Status: DC | PRN
  Filled 2019-06-14: qty 1

## 2019-06-14 MED ORDER — PANTOPRAZOLE SODIUM 40 MG IV SOLR
40.0000 mg | Freq: Two times a day (BID) | INTRAVENOUS | Status: AC
  Administered 2019-06-15 (×2): 40 mg via INTRAVENOUS
  Filled 2019-06-14 (×2): qty 40

## 2019-06-14 MED ORDER — ONDANSETRON HCL 4 MG/2ML IJ SOLN
4.0000 mg | Freq: Four times a day (QID) | INTRAMUSCULAR | Status: DC | PRN
  Administered 2019-06-14: 4 mg via INTRAVENOUS
  Filled 2019-06-14: qty 2

## 2019-06-14 MED ORDER — ONDANSETRON HCL 4 MG PO TABS: 4.0000 mg | ORAL_TABLET | Freq: Four times a day (QID) | ORAL | Status: DC | PRN

## 2019-06-14 MED ORDER — GENTAMICIN SULFATE 0.1 % EX CREA
1.0000 "application " | TOPICAL_CREAM | Freq: Every day | CUTANEOUS | Status: DC
  Administered 2019-06-14 – 2019-06-15 (×2): 1 via TOPICAL
  Filled 2019-06-14: qty 15

## 2019-06-14 MED ORDER — SODIUM CHLORIDE 0.9 % IV SOLN
8.0000 mg/h | INTRAVENOUS | Status: DC
  Administered 2019-06-14: 8 mg/h via INTRAVENOUS
  Filled 2019-06-14: qty 80

## 2019-06-14 MED ORDER — HEPARIN 1000 UNIT/ML FOR PERITONEAL DIALYSIS: 500.0000 [IU] | INTRAMUSCULAR | Status: DC | PRN

## 2019-06-14 MED ORDER — DELFLEX-LC/1.5% DEXTROSE 344 MOSM/L IP SOLN: INTRAPERITONEAL | Status: DC

## 2019-06-14 MED ORDER — ACETAMINOPHEN 650 MG RE SUPP: 650.0000 mg | Freq: Four times a day (QID) | RECTAL | Status: DC | PRN

## 2019-06-14 MED ORDER — ACETAMINOPHEN 325 MG PO TABS: 650.0000 mg | ORAL_TABLET | Freq: Four times a day (QID) | ORAL | Status: DC | PRN

## 2019-06-14 MED ORDER — SODIUM CHLORIDE 0.9 % IV SOLN
80.0000 mg | Freq: Once | INTRAVENOUS | Status: AC
  Administered 2019-06-14: 80 mg via INTRAVENOUS
  Filled 2019-06-14: qty 80

## 2019-06-14 MED ORDER — INSULIN ASPART 100 UNIT/ML ~~LOC~~ SOLN
0.0000 [IU] | Freq: Three times a day (TID) | SUBCUTANEOUS | Status: DC
  Administered 2019-06-15 – 2019-06-16 (×3): 1 [IU] via SUBCUTANEOUS

## 2019-06-14 NOTE — H&P (Signed)
History and Physical    [name] [name] BDZ:[id number] DOB: 1951/04/23 DOA: 06/14/2019  Referring MD/NP/PA: Margarita Mail, PA-C PCP: [name], [name], MD  Patient coming from: Home via EMS  Chief Complaint: Syncope  I have personally briefly reviewed patient's old medical records in [place]   HPI: [name] [name] is a 69 y.o. male with medical history significant of hypertension, ESRD on PD, DM type II with peripheral neuropathy, arthritis, anemia chronic disease, remote history of tobacco use, COVID-19 positive on 1/20, and functional diarrhea. History obtain from the patient with the assistance of his wife.  This morning his wife notes that she heard a noise and found him in the bathroom.  She reports that his hips were shaking, he was foaming at the mouth as if he was trying to throw up or cough, and his eyes were wide open.  Thereafter, he stopped moving and did not appear to be breathing for a few seconds before responding to his wife saying his name.   Patient recalls using the restroom this morning and then hearing his wife calling for him.  He did not fall all of the commode or sustained any trauma to his head.  Denies any personal history of seizures, but his daughter has a history of seizures.  Recently hospitalized from 1/23-1/25 for acute respiratory failure with hypoxia secondary to pneumonia due to COVID-19. He was supposed to get his last infusion of remdesivir today as well as another dose of Decadron.  Upon the patient getting home he reported having several episodes of vomiting.    Reports associated symptoms decreased appetite, hiccups, and diarrhea.  Denies having any significant abdominal pain. Wife notes that he had woken her up at 3 am stating that his systolic blood pressures were 85 and requesting something to eat.  He had improvement of blood pressure shortly thereafter.  His last colonoscopy was in 2012 and showed no signs of polyp.  He was seen by  [facility]. Emesis reported to coffee-ground in appearance that was seen by EMS.  He had been given 4 mg of Zofran IV.  ED Course: On admission into the emergency department patient was noted to be afebrile pulse 55-74, respirations 13-20, blood pressure 96/54-149/81, and O2 saturations currently maintained on room air.  Labs significant for hemoglobin 11, BUN 80, and creatinine 13.35.  Patient was given Rocephin IV and started on Protonix drip.  Neurology was formally consulted.  TRH called to admit.   Review of Systems  Constitutional: Positive for malaise/fatigue. Negative for chills and fever.  HENT: Negative for nosebleeds.   Eyes: Negative for double vision and photophobia.  Respiratory: Negative for shortness of breath.   Cardiovascular: Negative for chest pain and leg swelling.  Gastrointestinal: Positive for diarrhea, nausea and vomiting. Negative for abdominal pain.  Genitourinary: Negative for dysuria and hematuria.  Musculoskeletal: Negative for falls and myalgias.  Skin: Negative for itching and rash.  Neurological: Positive for loss of consciousness and weakness.  Psychiatric/Behavioral: Negative for substance abuse.    Past Medical History:  Diagnosis Date  . Anemia    as a younger man  . Arthritis    hands  . Chronic kidney disease    "starting to bother me now" (07/28/2012)  . Diabetes mellitus     type II  . Diarrhea, functional   . ED (erectile dysfunction)   . Hypertension   . Neuropathy   . Peripheral neuropathy     Past Surgical History:  Procedure Laterality Date  . APPENDECTOMY  1950's  . AV FISTULA PLACEMENT Right 05/14/2016   Procedure: ARTERIOVENOUS (AV) FISTULA CREATION- RIGHT ARM;  Surgeon: Serafina Mitchell, MD;  Location: [facility];  Service: Vascular;  Laterality: Right;  . COLONOSCOPY       reports that he quit smoking about 33 years ago. His smoking use included cigarettes. He has a 1.20 pack-year smoking history. He has never used smokeless  tobacco. He reports current alcohol use. He reports that he does not use drugs.  Allergies  Allergen Reactions  . Other Rash    Oxyquinoline-white Pet-lanolin  . No Known Allergies   . Bag Balm  [Albolene] Rash    Family History  Problem Relation Age of Onset  . Colon polyps Mother   . Stroke Mother   . Diabetes Father   . Diabetes Cousin     Prior to Admission medications   Medication Sig Start Date End Date Taking? Authorizing Provider  albuterol (VENTOLIN HFA) 108 (90 Base) MCG/ACT inhaler Inhale 2 puffs into the lungs every 6 (six) hours as needed for wheezing or shortness of breath. 06/12/19   Amin, Ankit Chirag, MD  amLODipine (NORVASC) 5 MG tablet Take 5 mg by mouth at bedtime. 04/25/19   [provider]  aspirin 81 MG tablet Take 1 tablet (81 mg total) by mouth daily. 08/09/14   Marletta Lor, MD  B Complex-C-Folic Acid (RENA-VITE RX) 1 MG TABS Take 1 mg by mouth See admin instructions. Take 1mg  daily on dialysis days. Take after treatment. 12/24/17   [provider]  calcitRIOL (ROCALTROL) 0.5 MCG capsule Take by mouth. 12/09/17   [provider]  cholecalciferol (VITAMIN D) 25 MCG tablet Take 1 tablet (1,000 Units total) by mouth daily. 06/13/19   Amin, Jeanella Flattery, MD  dexamethasone (DECADRON) 6 MG tablet Take 1 tablet (6 mg total) by mouth daily. 06/13/19   Amin, Jeanella Flattery, MD  Heparin Lock Flush (HEPARIN FLUSH) 10 UNIT/ML SOLN injection 50 Units See admin instructions. In dialysis bag    [provider]  sevelamer carbonate (RENVELA) 800 MG tablet Take 1-3 tablets by mouth See admin instructions. Take 3 tablets by mouth three times daily with meals and 1 tablet twice daily with snacks. 04/27/19   [provider]  telmisartan (MICARDIS) 40 MG tablet Take 40 mg by mouth daily.  08/23/18   [provider]  torsemide (DEMADEX) 100 MG tablet Take 100 mg by mouth daily.  01/10/18   [provider]  UNABLE TO FIND  TAKE 1 TABLET BY MOUTH EVERY DAY (ON DIALYSIS DAYS, TAKE AFTER DIALYSIS TREATMENT) 12/24/17   [provider]    Physical Exam:  Constitutional: Elderly male who appears to be in no acute distress at this time Vitals:   06/14/19 1100 06/14/19 1115 06/14/19 1130 06/14/19 1145  BP: 125/69 (!) 149/81 140/87 113/66  Pulse: 64 68 66 66  Resp: 17 18 20 20   Temp:      TempSrc:      SpO2: 98% 100% 98% 98%  Height:       Eyes: PERRL, lids and conjunctivae normal ENMT: Mucous membranes are dry. Posterior pharynx clear of any exudate or lesions.  Neck: normal, supple, no masses, no thyromegaly Respiratory: clear to auscultation bilaterally, no wheezing, no crackles. Normal respiratory effort. No accessory muscle use.  Cardiovascular: Regular rate and rhythm, no murmurs / rubs / gallops. No extremity edema. 2+ pedal pulses. No carotid bruits.  Fistula  present on right upper extremity. Abdomen: no tenderness, no masses palpated. No hepatosplenomegaly. Bowel sounds positive.  Musculoskeletal: no clubbing / cyanosis. No joint deformity upper and lower extremities. Good ROM, no contractures. Normal muscle tone.  Skin: no rashes, lesions, ulcers. No induration Neurologic: CN 2-12 grossly intact. Sensation intact, DTR normal. Strength 5/5 in all 4.  Psychiatric: Normal judgment and insight. Alert and oriented x 3. Normal mood.     Labs on Admission: I have personally reviewed following labs and imaging studies  CBC: Recent Labs  Lab 06/10/19 0241 06/10/19 0740 06/11/19 0239 06/12/19 0605 06/14/19 0822  WBC 9.3 7.8 5.2 8.5 7.8  NEUTROABS 6.1 4.9 4.1 6.9 6.2  HGB 11.7* 10.7* 10.7* 10.9* 11.0*  HCT 37.1* 32.8* 32.7* 32.0* 32.4*  MCV 92.5 90.4 89.1 85.3 88.5  PLT 237 211 244 280 626   Basic Metabolic Panel: Recent Labs  Lab 06/10/19 0241 06/11/19 0239 06/12/19 0605 06/14/19 0822  NA 135 134* 134* 134*  K 3.2* 4.4 4.1 3.2*  CL 96* 97* 94* 92*  CO2 24 23 22 22   GLUCOSE 140*  279* 241* 272*  BUN 45* 54* 66* 80*  CREATININE 11.80* 12.01* 12.84* 13.35*  CALCIUM 8.4* 8.2* 8.4* 8.2*  MG  --  2.1 2.2  --   PHOS  --  6.4* 7.7*  --    GFR: Estimated Creatinine Clearance: 5 mL/min (A) (by C-G formula based on SCr of 13.35 mg/dL (H)). Liver Function Tests: Recent Labs  Lab 06/10/19 0241 06/11/19 0239 06/12/19 0605 06/14/19 0822  AST 15 18 17  37  ALT 13 14 17  43  ALKPHOS 41 37* 39 40  BILITOT 0.7 0.3 0.8 0.3  PROT 7.3 6.4* 6.5 6.6  ALBUMIN 2.8* 2.2* 2.5* 2.8*   Recent Labs  Lab 06/14/19 0822  LIPASE 57*   Recent Labs  Lab 06/14/19 0822  AMMONIA 18   Coagulation Profile: Recent Labs  Lab 06/14/19 0905  INR 1.1   Cardiac Enzymes: No results for input(s): CKTOTAL, CKMB, CKMBINDEX, TROPONINI in the last 168 hours. BNP (last 3 results) No results for input(s): PROBNP in the last 8760 hours. HbA1C: No results for input(s): HGBA1C in the last 72 hours. CBG: Recent Labs  Lab 06/11/19 1158 06/11/19 1606 06/11/19 2114 06/12/19 0733 06/12/19 1107  GLUCAP 98 153* 215* 210* 84   Lipid Profile: No results for input(s): CHOL, HDL, LDLCALC, TRIG, CHOLHDL, LDLDIRECT in the last 72 hours. Thyroid Function Tests: No results for input(s): TSH, T4TOTAL, FREET4, T3FREE, THYROIDAB in the last 72 hours. Anemia Panel: Recent Labs    06/12/19 0605  FERRITIN 1,761*   Urine analysis:    Component Value Date/Time   COLORURINE YELLOW 07/31/2014 2130   APPEARANCEUR CLOUDY (A) 07/31/2014 2130   LABSPEC 1.034 (H) 07/31/2014 2130   PHURINE 5.0 07/31/2014 2130   GLUCOSEU >1000 (A) 07/31/2014 2130   HGBUR TRACE (A) 07/31/2014 2130   HGBUR trace-lysed 06/16/2010 0833   BILIRUBINUR n 05/22/2015 0927   KETONESUR NEGATIVE 07/31/2014 2130   PROTEINUR 3+ 05/22/2015 0927   PROTEINUR 100 (A) 07/31/2014 2130   UROBILINOGEN 0.2 05/22/2015 0927   UROBILINOGEN 0.2 07/31/2014 2130   NITRITE n 05/22/2015 0927   NITRITE NEGATIVE 07/31/2014 2130   LEUKOCYTESUR  Negative 05/22/2015 0927   Sepsis Labs: Recent Results (from the past 240 hour(s))  Novel Coronavirus, NAA ([facility])     Status: Abnormal   Collection Time: 06/07/19 10:07 AM   Specimen: Nasopharyngeal(NP) swabs in vial transport medium  NASOPHARYNGE  TESTING  Result Value Ref Range Status   SARS-CoV-2, NAA Detected (A) Not Detected Final    Comment: This nucleic acid amplification test was developed and its performance characteristics determined by [employer]. Nucleic acid amplification tests include RT-PCR and TMA. This test has not been FDA cleared or approved. This test has been authorized by FDA under an Emergency Use Authorization (EUA). This test is only authorized for the duration of time the declaration that circumstances exist justifying the authorization of the emergency use of in vitro diagnostic tests for detection of SARS-CoV-2 virus and/or diagnosis of COVID-19 infection under section 564(b)(1) of the Act, 21 U.S.C. 756EPP-2(R) (1), unless the authorization is terminated or revoked sooner. When diagnostic testing is negative, the possibility of a false negative result should be considered in the context of a patient's recent exposures and the presence of clinical signs and symptoms consistent with COVID-19. An individual without symptoms of COVID-19 and who is not shedding SARS-CoV-2 virus wo uld expect to have a negative (not detected) result in this assay.   Blood Culture (routine x 2)     Status: None (Preliminary result)   Collection Time: 06/10/19  7:16 AM   Specimen: BLOOD LEFT HAND  Result Value Ref Range Status   Specimen Description BLOOD LEFT HAND  Final   Special Requests   Final    BOTTLES DRAWN AEROBIC AND ANAEROBIC Blood Culture results may not be optimal due to an inadequate volume of blood received in culture bottles   Culture   Final    NO GROWTH 4 DAYS Performed at [facility], [place] [address], [place], [place] [address]     Report Status PENDING  Incomplete  Blood Culture (routine x 2)     Status: None (Preliminary result)   Collection Time: 06/10/19  8:16 AM   Specimen: BLOOD  Result Value Ref Range Status   Specimen Description BLOOD LEFT ANTECUBITAL  Final   Special Requests   Final    BOTTLES DRAWN AEROBIC AND ANAEROBIC Blood Culture results may not be optimal due to an excessive volume of blood received in culture bottles   Culture   Final    NO GROWTH 4 DAYS Performed at [facility], [place] [address] [place], [place] [address]    Report Status PENDING  Incomplete  Culture, body fluid-bottle     Status: Abnormal   Collection Time: 06/10/19  8:26 PM   Specimen: Peritoneal Dialysate  Result Value Ref Range Status   Specimen Description PERITONEAL DIALYSATE  Final   Special Requests   Final    BOTTLES DRAWN AEROBIC AND ANAEROBIC Blood Culture adequate volume   Gram Stain   Final    GRAM POSITIVE COCCI IN BOTH AEROBIC AND ANAEROBIC BOTTLES CRITICAL RESULT CALLED TO, READ BACK BY AND VERIFIED WITH: S. MAFAT, RN AT 2120 ON 06/11/19 BY C. JESSUP, MT. Performed at [facility], [place] [address], [place], [place] [address]    Culture STAPHYLOCOCCUS HOMINIS (A)  Final   Report Status 06/13/2019 FINAL  Final   Organism ID, Bacteria STAPHYLOCOCCUS HOMINIS  Final      Susceptibility   Staphylococcus hominis - MIC*    CIPROFLOXACIN <=0.5 SENSITIVE Sensitive     ERYTHROMYCIN <=0.25 SENSITIVE Sensitive     GENTAMICIN <=0.5 SENSITIVE Sensitive     OXACILLIN RESISTANT Resistant     TETRACYCLINE 2 SENSITIVE Sensitive     VANCOMYCIN 1 SENSITIVE Sensitive     TRIMETH/SULFA <=  10 SENSITIVE Sensitive     CLINDAMYCIN <=0.25 SENSITIVE Sensitive     RIFAMPIN <=0.5 SENSITIVE Sensitive     Inducible Clindamycin NEGATIVE Sensitive     * STAPHYLOCOCCUS HOMINIS  Gram stain     Status: None   Collection Time: 06/10/19  8:26 PM   Specimen: Peritoneal Dialysate  Result Value Ref Range Status   Specimen  Description PERITONEAL DIALYSATE FLUID  Final   Special Requests NONE  Final   Gram Stain   Final    NO WBC SEEN NO ORGANISMS SEEN Performed at [facility], [place] [address], [place], [place] [address]    Report Status 06/11/2019 FINAL  Final  C difficile quick scan w PCR reflex     Status: None   Collection Time: 06/10/19  8:40 PM   Specimen: STOOL  Result Value Ref Range Status   C Diff antigen NEGATIVE NEGATIVE Final   C Diff toxin NEGATIVE NEGATIVE Final   C Diff interpretation No C. difficile detected.  Final    Comment: Performed at [facility], [place] [address] [place], [place] [address]  GI pathogen panel by PCR, stool     Status: None   Collection Time: 06/10/19  8:40 PM   Specimen: Stool  Result Value Ref Range Status   Plesiomonas shigelloides NOT DETECTED NOT DETECTED Final   Yersinia enterocolitica NOT DETECTED NOT DETECTED Final   Vibrio NOT DETECTED NOT DETECTED Final   Enteropathogenic E coli NOT DETECTED NOT DETECTED Final   E coli (ETEC) LT/ST NOT DETECTED NOT DETECTED Final   E coli 6962 by PCR Not applicable NOT DETECTED Final   Cryptosporidium by PCR NOT DETECTED NOT DETECTED Final   Entamoeba histolytica NOT DETECTED NOT DETECTED Final   Adenovirus F 40/41 NOT DETECTED NOT DETECTED Final   Norovirus GI/GII NOT DETECTED NOT DETECTED Final   Sapovirus NOT DETECTED NOT DETECTED Final    Comment: (NOTE) Performed At: [facility] [place], [place] [id number] Rush Farmer MD MW:[id number]    Vibrio cholerae NOT DETECTED NOT DETECTED Final   Campylobacter by PCR NOT DETECTED NOT DETECTED Final   Salmonella by PCR NOT DETECTED NOT DETECTED Final   E coli (STEC) NOT DETECTED NOT DETECTED Final   Enteroaggregative E coli NOT DETECTED NOT DETECTED Final   Shigella by PCR NOT DETECTED NOT DETECTED Final   Cyclospora cayetanensis NOT DETECTED NOT DETECTED Final   Astrovirus NOT DETECTED NOT DETECTED Final   G lamblia by PCR NOT  DETECTED NOT DETECTED Final   Rotavirus A by PCR NOT DETECTED NOT DETECTED Final  Blood culture (routine x 2)     Status: None (Preliminary result)   Collection Time: 06/14/19  8:40 AM   Specimen: BLOOD  Result Value Ref Range Status   Specimen Description BLOOD LEFT ANTECUBITAL  Final   Special Requests   Final    BOTTLES DRAWN AEROBIC AND ANAEROBIC Blood Culture adequate volume   Culture   Final    NO GROWTH < 12 HOURS Performed at [facility], [address] [place], [place] [address]    Report Status PENDING  Incomplete  Blood culture (routine x 2)     Status: None (Preliminary result)   Collection Time: 06/14/19  8:54 AM   Specimen: BLOOD LEFT HAND  Result Value Ref Range Status   Specimen Description BLOOD LEFT HAND  Final   Special Requests   Final    BOTTLES DRAWN AEROBIC  ONLY Blood Culture results may not be optimal due to an inadequate volume of blood received in culture bottles   Culture   Final    NO GROWTH < 12 HOURS Performed at [place] [address] [place], [place] [address]    Report Status PENDING  Incomplete     Radiological Exams on Admission: DG Chest Port 1 View  Result Date: 06/14/2019 CLINICAL DATA:  Shortness of breath.  COVID-19 positive EXAM: PORTABLE CHEST 1 VIEW COMPARISON:  June 10, 2019 FINDINGS: Lungs are clear. Heart size and pulmonary vascularity are normal. No adenopathy. There is mild degenerative change in the thoracic spine. IMPRESSION: Lungs clear.  Stable cardiac silhouette.  No adenopathy. Electronically Signed   By: Lowella Grip III M.D.   On: 06/14/2019 10:14    EKG: Independently reviewed.  Sinus rhythm at 60 bpm  Assessment/Plan Hematemesis with nausea: Patient reportedly had several episodes of nausea and vomiting.  Stool guaiacs were noted to be positive.  He had been on steroids and low-dose aspirin which could be contributing to his symptoms.  Questioning stress gastritis versus Mallory-Weiss tear as  cause of symptoms. -Admit to a medical telemetry bed -Continue Protonix drip transition to be twice daily per GI -Hold aspirin  -Clear liquid diet as tolerated -Antiemetics as needed -GI consulted, will follow-up for further recommendation  Seizure versus syncope: Wife reports that it appeared that the patient was having a seizure and was foaming at the mouth when she found him this morning on the toilet.  On the differential includes convulsive syncope.   -Seizure precautions -Discussed case with neurology who recommended checking MRI of the brain without contrast and EEG. -Formally consult neurology if studies are found to be abnormal  COVID-19: Patient had been diagnosed with COVID-19 on 1/20.  Chest x-ray today otherwise noted to be clear.  He had been started on remdesivir and was set up to complete last infusion today. -Continue Decadron and remdesivir to complete regimen  Diarrhea: Acute on chronic.  Patient reportedly started having diarrhea again since being home. -Continue to monitor intake and  ESRD on peritoneal dialysis: Labs significant for BUN 80, creatinine 13.35. -Nephrology notified of need of peritoneal dialysis  Anemia of chronic disease: Hemoglobin 11.0 which appears near patient's baseline.  -Continue to monitor H&H -Transfuse blood products as needed  Essential hypertension: On admission blood pressures noted to be soft 94/56.  Home blood pressure medications appear to include telmisartan 20 mg daily, amlodipine 5 mg, and torsemide 100 mg daily. -Restart home regimen when medically appropriate   Diabetes mellitus type 2, controlled: Last hemoglobin A1c noted to be 6.2 last month.  Patient not on any medications for diabetes at home, but initial blood glucose elevated to 272. -Hypoglycemic protocols -CBGs before every meal and nightly with very sensitive SSI -Adjust insulin regimen as needed  DVT prophylaxis: SCDs  Code Status: Full Family Communication:  [place] plan of care with the patient's wife over the phone Disposition Plan: *Likely discharge home once medically stable Consults called: nephrology Admission status:observation  Norval Morton MD [facility] Pager [phone]   If 7PM-7AM, please contact night-coverage [email or web address] Password [email or web address]  06/14/2019, 12:12 PM

## 2019-06-14 NOTE — Consult Note (Signed)
[facility] Consult: 1:19 PM 06/14/2019  LOS: 0 days    Referring Provider: Dr Tamala Julian  Primary Care Physician:  [name], [name], MD Primary Gastroenterologist: Verl Blalock remotely.    Reason for Consultation: Coffee-ground emesis.   HPI: [name] [name] is a 69 y.o. male.  ESRD.  On peritoneal dialysis.  IDDM.  Hypertension.  Chronic anemia.  Functional diarrhea.  Peripheral neuropathy. S/p appendectomy, s/p prostatectomy for cancer. 07/2010 colonoscopy.  Normal study.  Recommend repeat average risk screening study in 2022.  Admission 06/10/19 - 06/12/2019 with Covid pneumonia.  Treated with Decadron, remdesivir.  CT during the admission showed small vessel ischemia but no acute findings.  Discharged to complete a course of dexamethasone, remdesivir infusions, chronic low-dose aspirin.  Remdesivir infusion # 4 as outpatient yesterday  Presented to the emergency room today with CO CGE, diarrhea, nausea.  Yesterday midday he developed nausea, vomiting of partially digested food then Cotton like foamy clear material, then brown liquid.  He alternated back and forth between the clear, foamy and brown emesis.  Throughout the day, the evening, overnight, this morning.  Yesterday he had taken stool softeners because he had not had a bowel movement for 2 or 3 days.  This morning while he was sitting on the commode he had a syncopal episode, had a bowel movement but is not sure what it looks like or what color it was.  He does not seem to think it had blood in it. Hb 11.  It was 10.93 days ago.  CV normal Hypokalemic at 3.2.  BUN/creatinine 80/13.3. Lipase 57.  LFTs normal.  Has been started on Protonix drip.  No emesis or stools since arrival in ED this morning.  He feels ready to consume clear liquids.    Past Medical History:  Diagnosis Date  . Anemia    as a younger man  . Arthritis    hands  . Chronic kidney disease    "starting to bother me now" (07/28/2012)  . Diabetes mellitus     type II  . Diarrhea, functional   . ED (erectile dysfunction)   . Hypertension   . Neuropathy   . Peripheral neuropathy     Past Surgical History:  Procedure Laterality Date  . APPENDECTOMY  1950's  . AV FISTULA PLACEMENT Right 05/14/2016   Procedure: ARTERIOVENOUS (AV) FISTULA CREATION- RIGHT ARM;  Surgeon: Serafina Mitchell, MD;  Location: [facility];  Service: Vascular;  Laterality: Right;  . COLONOSCOPY      Prior to Admission medications   Medication Sig Start Date End Date Taking? Authorizing Provider  albuterol (VENTOLIN HFA) 108 (90 Base) MCG/ACT inhaler Inhale 2 puffs into the lungs every 6 (six) hours as needed for wheezing or shortness of breath. 06/12/19  Yes Amin, Ankit Chirag, MD  amLODipine (NORVASC) 5 MG tablet Take 5 mg by mouth at bedtime. 04/25/19  Yes [provider]  aspirin 81 MG tablet Take 1 tablet (81 mg total) by mouth daily. 08/09/14  Yes Marletta Lor, MD  B Complex-C-Folic Acid (  RENA-VITE RX) 1 MG TABS Take 1 mg by mouth See admin instructions. Take 1mg  daily on dialysis days. Take after treatment. 12/24/17  Yes [provider]  calcitRIOL (ROCALTROL) 0.5 MCG capsule Take 0.5 mcg by mouth daily.  12/09/17  Yes [provider]  cholecalciferol (VITAMIN D) 25 MCG tablet Take 1 tablet (1,000 Units total) by mouth daily. 06/13/19  Yes Amin, Ankit Chirag, MD  dexamethasone (DECADRON) 6 MG tablet Take 1 tablet (6 mg total) by mouth daily. 06/13/19  Yes Amin, Jeanella Flattery, MD  Heparin Lock Flush (HEPARIN FLUSH) 10 UNIT/ML SOLN injection 50 Units See admin instructions. In dialysis bag   Yes [provider]  sevelamer carbonate (RENVELA) 800 MG tablet Take 1-3 tablets by mouth See admin instructions. Take 3 tablets by mouth three times daily with  meals and 1 tablet twice daily with snacks. 04/27/19  Yes [provider]  telmisartan (MICARDIS) 40 MG tablet Take 40 mg by mouth daily.  08/23/18  Yes [provider]  torsemide (DEMADEX) 100 MG tablet Take 100 mg by mouth daily.  01/10/18  Yes [provider]  [place] 1 TABLET BY MOUTH EVERY DAY (ON DIALYSIS DAYS, TAKE AFTER DIALYSIS TREATMENT) 12/24/17   [provider]    Scheduled Meds: . calcitRIOL  0.5 mcg Oral Daily  . dexamethasone  6 mg Oral Daily  . sevelamer carbonate  2,400 mg Oral TID WC  . sodium chloride flush  3 mL Intravenous Q12H   Infusions: . pantoprozole (PROTONIX) infusion 8 mg/hr (06/14/19 0935)  . remdesivir 100 mg in NS 100 mL     PRN Meds: acetaminophen **OR** acetaminophen, ondansetron **OR** ondansetron (ZOFRAN) IV, sevelamer carbonate   Allergies as of 06/14/2019 - Review Complete 06/13/2019  Allergen Reaction Noted  . Other Rash 02/12/2017  . No known allergies  05/13/2016  . Bag balm  [albolene] Rash 02/03/2019    Family History  Problem Relation Age of Onset  . Colon polyps Mother   . Stroke Mother   . Diabetes Father   . Diabetes Cousin     Social History   Socioeconomic History  . Marital status: Married    Spouse name: Not on file  . Number of children: Not on file  . Years of education: Not on file  . Highest education level: Not on file  Occupational History    Employer: [employer]    Comment: [employer]  Tobacco Use  . Smoking status: Former Smoker    Packs/day: 0.12    Years: 10.00    Pack years: 1.20    Types: Cigarettes    Quit date: 04/27/1986    Years since quitting: 33.1  . Smokeless tobacco: Never Used  . Tobacco comment: 07/28/2012 "quit smoking cigarettes 20-30 yr ago"  Substance and Sexual Activity  . Alcohol use: Yes    Comment: maybe a couple a month  . Drug use: No  . Sexual activity: Not Currently  Other Topics Concern  . Not on file  Social  History Narrative  . Not on file   Social Determinants of Health   Financial Resource Strain:   . Difficulty of Paying Living Expenses: Not on file  Food Insecurity:   . Worried About Charity fundraiser in the Last Year: Not on file  . Ran Out of Food in the Last Year: Not on file  Transportation Needs:   . Lack of Transportation (Medical): Not on file  .  Lack of Transportation (Non-Medical): Not on file  Physical Activity:   . Days of Exercise per Week: Not on file  . Minutes of Exercise per Session: Not on file  Stress:   . Feeling of Stress : Not on file  Social Connections:   . Frequency of Communication with Friends and Family: Not on file  . Frequency of Social Gatherings with Friends and Family: Not on file  . Attends Religious Services: Not on file  . Active Member of Clubs or Organizations: Not on file  . Attends Archivist Meetings: Not on file  . Marital Status: Not on file  Intimate Partner Violence:   . Fear of Current or Ex-Partner: Not on file  . Emotionally Abused: Not on file  . Physically Abused: Not on file  . Sexually Abused: Not on file    REVIEW OF SYSTEMS: Constitutional: Generally had been doing okay up until he started getting the nausea and vomiting yesterday. ENT:  No nose bleeds Pulm: No dyspnea.  No cough. CV:  No palpitations, no LE edema.  No chest pain, angina. GU: Oliguric.  Occasionally incontinent of urine. GI: See HPI.  Generally patient has no reflux symptoms, abdominal pain, anorexia, nausea.  Does not require use of PPI or H2 blocker/antacids. Heme: Denies excessive or unusual bleeding or bruising. Transfusions: None Neuro:  No headaches, no peripheral tingling or numbness Derm:  No itching, no rash or sores.  Endocrine:  No sweats or chills.  No polyuria or dysuria Immunization: Reviewed.  He is up-to-date on multiple vaccinations. Travel:  None beyond local counties in last few months.    PHYSICAL EXAM: Vital signs  in last 24 hours: Vitals:   06/14/19 1215 06/14/19 1250  BP: 111/64   Pulse: 67 75  Resp: 18 15  Temp:    SpO2: 97% 99%   Wt Readings from Last 3 Encounters:  06/11/19 68.9 kg  04/19/19 73.3 kg  06/29/16 75.9 kg    General: Pleasant, slightly frail, alert, comfortable, does not look acutely ill. Head: No facial asymmetry or swelling.  No signs of head trauma. Eyes: No scleral icterus.  No conjunctival pallor.  EOMI. Ears: No hearing loss Nose: No congestion, no discharge Mouth: Oral mucosa pink, moist, clear.  Tongue midline. Neck: No JVD, no masses, no thyromegaly. Lungs: No labored breathing or cough.  Lungs clear bilaterally. Heart: RRR.  No MRG.  S1, S2 present Abdomen: Soft.  Not tender.  Peritoneal dialysis catheter site benign on left abdomen.  Active bowel sounds.  No distention..   Rectal: I did not have the materials with which to perform occult blood test but the stool itself was soft/loose, medium brown and there was no blood.  No masses in the rectum. Musc/Skeltl: No joint redness or swelling. Extremities: No CCE. Neurologic: Alert.  Oriented x3.  No tremors, no limb weakness.  Moves all 4 limbs. Skin: No rash, no sores, no suspicious lesions. Tattoos: None observed Nodes: No cervical adenopathy. Psych: Pleasant, cooperative.  Detailed historian.  Intake/Output from previous day: No intake/output data recorded. Intake/Output this shift: No intake/output data recorded.  LAB RESULTS: Recent Labs    06/12/19 0605 06/14/19 0822  WBC 8.5 7.8  HGB 10.9* 11.0*  HCT 32.0* 32.4*  PLT 280 281   BMET Lab Results  Component Value Date   NA 134 (L) 06/14/2019   NA 134 (L) 06/12/2019   NA 134 (L) 06/11/2019   K 3.2 (L) 06/14/2019   K 4.1  06/12/2019   K 4.4 06/11/2019   CL 92 (L) 06/14/2019   CL 94 (L) 06/12/2019   CL 97 (L) 06/11/2019   CO2 22 06/14/2019   CO2 22 06/12/2019   CO2 23 06/11/2019   GLUCOSE 272 (H) 06/14/2019   GLUCOSE 241 (H) 06/12/2019    GLUCOSE 279 (H) 06/11/2019   BUN 80 (H) 06/14/2019   BUN 66 (H) 06/12/2019   BUN 54 (H) 06/11/2019   CREATININE 13.35 (H) 06/14/2019   CREATININE 12.84 (H) 06/12/2019   CREATININE 12.01 (H) 06/11/2019   CALCIUM 8.2 (L) 06/14/2019   CALCIUM 8.4 (L) 06/12/2019   CALCIUM 8.2 (L) 06/11/2019   LFT Recent Labs    06/12/19 0605 06/14/19 0822  PROT 6.5 6.6  ALBUMIN 2.5* 2.8*  AST 17 37  ALT 17 43  ALKPHOS 39 40  BILITOT 0.8 0.3   PT/INR Lab Results  Component Value Date   INR 1.1 06/14/2019   Hepatitis Panel No results for input(s): HEPBSAG, HCVAB, HEPAIGM, HEPBIGM in the last 72 hours. C-Diff No components found for: CDIFF Lipase     Component Value Date/Time   LIPASE 57 (H) 06/14/2019 4037    Drugs of Abuse  No results found for: LABOPIA, COCAINSCRNUR, LABBENZ, AMPHETMU, THCU, LABBARB   RADIOLOGY STUDIES: DG Chest Port 1 View  Result Date: 06/14/2019 CLINICAL DATA:  Shortness of breath.  COVID-19 positive EXAM: PORTABLE CHEST 1 VIEW COMPARISON:  June 10, 2019 FINDINGS: Lungs are clear. Heart size and pulmonary vascularity are normal. No adenopathy. There is mild degenerative change in the thoracic spine. IMPRESSION: Lungs clear.  Stable cardiac silhouette.  No adenopathy. Electronically Signed   By: Lowella Grip III M.D.   On: 06/14/2019 10:14     IMPRESSION:   *   Coffee-ground emesis.  This has been intermittent going back and forth between clear emesis and CGE.  Suspect esophagitis, possible gastritis  >> ulcer.  *   Syncope x 1.  MRI brain ordered.  *    Recently hospitalization w Covid infection, 1/23 -1/25.  Received remdesivir, Decadron.  Discharged on dexamethasone, outpatient remdesivir infusions.  His last remdesivir infusion is scheduled for today 1/27. Has not had any trouble breathing or coughing since discharge.  *    ESRD.  On peritoneal dialysis.  Oliguria.      PLAN:     *   I do not think this situation requires the Protonix drip so  after the current bag finishes infusion will begin IV, bid Protonix. Not clear we need to pursue EGD, it will depend on the next several hours.  If he continues to have CGE, can set him up for endoscopy.  Otherwise treat with PPI.  *    Agree with clears.  *   His final remdesivir infusion probably needs to be arranged.  *     Follow-up CBC in the morning   Azucena Freed  06/14/2019, 1:19 PM Phone [phone]

## 2019-06-14 NOTE — Progress Notes (Signed)
EEG complete - results pending 

## 2019-06-14 NOTE — ED Notes (Signed)
Got patient on the monitor did ekg shown to er doctor patient is resting with call bell in reach 

## 2019-06-14 NOTE — ED Notes (Signed)
Report called to [facility] . Pt mat go to [facility] [place] on return from MRI.

## 2019-06-14 NOTE — ED Provider Notes (Signed)
[facility] EMERGENCY DEPARTMENT Provider Note   CSN: [id number] Arrival date & time: 06/14/19  4166     History Chief Complaint  Patient presents with  . Weakness    COVID POSITIVE  . Emesis    ACHILLES NEVILLE is a 69 y.o. male with a past medical history of end-stage renal disease on home peritoneal dialysis, patient was admitted and discharged on 06/12/2019 for hypoxic respiratory failure with ambulation secondary to COVID-19 infection, diagnosed on 1/20.  Patient states that since he was discharged he has been vomiting and unable to hold anything down.  Patient states that he was not making a bowel movement so he took some stool softeners and started having diarrhea today.  His daughter called EMS this morning because he had a syncopal episode with myoclonic jerking.  EMS reports that his initial pressure when they pulled him off of his peritoneal dialysis machine was in the 90s however it improved to about 130.  Patient also had several episodes of vomiting in front of them which appear to have coffee-ground emesis.  He denies a history of GI bleed.  He denies any abdominal pain.  He was unable to complete dialysis.  During EMS evaluation the patient had another episode of loss of consciousness.  He denies shortness of breath.  He denies fevers or chills at home.  Patient is currently getting outpatient remdesivir infusions and had one yesterday at home.  HPI     Past Medical History:  Diagnosis Date  . Anemia    as a younger man  . Arthritis    hands  . Chronic kidney disease    "starting to bother me now" (07/28/2012)  . Diabetes mellitus     type II  . Diarrhea, functional   . ED (erectile dysfunction)   . Hypertension   . Neuropathy   . Peripheral neuropathy     Patient Active Problem List   Diagnosis Date Noted  . Hematemesis 06/14/2019  . COVID-19 virus infection 06/10/2019  . Hypokalemia 06/10/2019  . Fall at home, initial encounter 06/10/2019   . Syncope 06/10/2019  . Elevated troponin 06/10/2019  . Elevated PSA, between 10 and less than 20 ng/ml 02/12/2017  . ESRD on peritoneal dialysis ([place]) 02/12/2017  . Retinopathy due to secondary diabetes ([place]) 02/12/2017  . Secondary hyperparathyroidism ([place]) 02/12/2017  . Prostate cancer ([place]) 02/08/2017  . Preop cardiovascular exam 04/27/2016  . Diabetes mellitus type 2, controlled ([place]) 06/21/2015  . Hypercholesteremia 12/11/2014  . Essential hypertension 09/10/2014  . SIRS (systemic inflammatory response syndrome) ([place]) 08/01/2014  . Bronchitis   . Sepsis ([place]) 07/31/2014  . Syncope due to orthostatic hypotension 07/28/2012  . Chronic kidney disease, stage V ([place]) 07/28/2012  . Acute kidney failure ([place]) 07/28/2012  . Hereditary and idiopathic peripheral neuropathy 07/20/2007    Past Surgical History:  Procedure Laterality Date  . APPENDECTOMY  1950's  . AV FISTULA PLACEMENT Right 05/14/2016   Procedure: ARTERIOVENOUS (AV) FISTULA CREATION- RIGHT ARM;  Surgeon: Serafina Mitchell, MD;  Location: [facility];  Service: Vascular;  Laterality: Right;  . COLONOSCOPY    . PROSTATE SURGERY         Family History  Problem Relation Age of Onset  . Colon polyps Mother   . Stroke Mother   . Diabetes Father   . Diabetes Cousin     Social History   Tobacco Use  . Smoking status: Former Smoker    Packs/day: 0.12  Years: 10.00    Pack years: 1.20    Types: Cigarettes    Quit date: 04/27/1986    Years since quitting: 33.1  . Smokeless tobacco: Never Used  . Tobacco comment: 07/28/2012 "quit smoking cigarettes 20-30 yr ago"  Substance Use Topics  . Alcohol use: Yes    Comment: maybe a couple a month  . Drug use: No    Home Medications Prior to Admission medications   Medication Sig Start Date End Date Taking? Authorizing Provider  albuterol (VENTOLIN HFA) 108 (90 Base) MCG/ACT inhaler Inhale 2 puffs into the lungs every 6 (six) hours as needed for wheezing or shortness of  breath. 06/12/19  Yes Amin, Ankit Chirag, MD  amLODipine (NORVASC) 5 MG tablet Take 5 mg by mouth at bedtime. 04/25/19  Yes [provider]  aspirin 81 MG tablet Take 1 tablet (81 mg total) by mouth daily. 08/09/14  Yes Marletta Lor, MD  B Complex-C-Folic Acid (RENA-VITE RX) 1 MG TABS Take 1 mg by mouth See admin instructions. Take 1mg  daily on dialysis days. Take after treatment. 12/24/17  Yes [provider]  calcitRIOL (ROCALTROL) 0.5 MCG capsule Take 0.5 mcg by mouth daily.  12/09/17  Yes [provider]  cholecalciferol (VITAMIN D) 25 MCG tablet Take 1 tablet (1,000 Units total) by mouth daily. 06/13/19  Yes Amin, Ankit Chirag, MD  dexamethasone (DECADRON) 6 MG tablet Take 1 tablet (6 mg total) by mouth daily. 06/13/19  Yes Amin, Jeanella Flattery, MD  Heparin Lock Flush (HEPARIN FLUSH) 10 UNIT/ML SOLN injection 50 Units See admin instructions. In dialysis bag   Yes [provider]  sevelamer carbonate (RENVELA) 800 MG tablet Take 1-3 tablets by mouth See admin instructions. Take 3 tablets by mouth three times daily with meals and 1 tablet twice daily with snacks. 04/27/19  Yes [provider]  telmisartan (MICARDIS) 40 MG tablet Take 40 mg by mouth daily.  08/23/18  Yes [provider]  torsemide (DEMADEX) 100 MG tablet Take 100 mg by mouth daily.  01/10/18  Yes [provider]  [place] 1 TABLET BY MOUTH EVERY DAY (ON DIALYSIS DAYS, TAKE AFTER DIALYSIS TREATMENT) 12/24/17   [provider]    Allergies    Other, No known allergies, and Bag balm  [albolene]  Review of Systems   Review of Systems Ten systems reviewed and are negative for acute change, except as noted in the HPI.   Physical Exam Updated Vital Signs BP 133/76   Pulse 78   Temp (!) 97.3 F (36.3 C) (Oral)   Resp (!) 9   Ht 5\' 10"  (1.778 m)   SpO2 98%   BMI 21.81 kg/m   Physical Exam Vitals and nursing note reviewed. Exam conducted with a  chaperone present.  Constitutional:      General: He is not in acute distress.    Appearance: He is well-developed and underweight. He is not diaphoretic.  HENT:     Head: Normocephalic and atraumatic.  Eyes:     General: No scleral icterus.    Conjunctiva/sclera: Conjunctivae normal.  Cardiovascular:     Rate and Rhythm: Normal rate and regular rhythm.     Heart sounds: Normal heart sounds.  Pulmonary:     Effort: Pulmonary effort is normal. No respiratory distress.     Breath sounds: Normal breath sounds.  Abdominal:     General: There is distension.     Palpations: Abdomen is soft.  Tenderness: There is no abdominal tenderness.     Comments: Dialysis catheter strapped to the top of his abdomen.  His abdomen is distended and fluid-filled, but not warm, or tender to palpation.  Genitourinary:    Comments: Stool color brown on examining finger Musculoskeletal:     Cervical back: Normal range of motion and neck supple.  Skin:    General: Skin is warm and dry.  Neurological:     Mental Status: He is lethargic.  Psychiatric:        Behavior: Behavior normal.     ED Results / Procedures / Treatments   Labs (all labs ordered are listed, but only abnormal results are displayed) Labs Reviewed  COMPREHENSIVE METABOLIC PANEL - Abnormal; Notable for the following components:      Result Value   Sodium 134 (*)    Potassium 3.2 (*)    Chloride 92 (*)    Glucose, Bld 272 (*)    BUN 80 (*)    Creatinine, Ser 13.35 (*)    Calcium 8.2 (*)    Albumin 2.8 (*)    GFR calc non Af Amer 3 (*)    GFR calc Af Amer 4 (*)    Anion gap 20 (*)    All other components within normal limits  CBC WITH DIFFERENTIAL/PLATELET - Abnormal; Notable for the following components:   RBC 3.66 (*)    Hemoglobin 11.0 (*)    HCT 32.4 (*)    Abs Immature Granulocytes 0.11 (*)    All other components within normal limits  LIPASE, BLOOD - Abnormal; Notable for the following components:   Lipase 57 (*)      All other components within normal limits  POC OCCULT BLOOD, ED - Abnormal; Notable for the following components:   Fecal Occult Bld POSITIVE (*)    All other components within normal limits  CULTURE, BLOOD (ROUTINE X 2)  CULTURE, BLOOD (ROUTINE X 2)  AMMONIA  LACTIC ACID, PLASMA  PROTIME-INR  HEMOGLOBIN AND HEMATOCRIT, BLOOD  TYPE AND SCREEN    EKG EKG Interpretation  Date/Time:  Wednesday June 14 2019 08:19:34 EST Ventricular Rate:  60 PR Interval:    QRS Duration: 73 QT Interval:  458 QTC Calculation: 458 R Axis:   64 Text Interpretation: Sinus rhythm Minimal ST elevation, inferior leads No significant change since last tracing Confirmed by Dorie Rank [phone]) on 06/14/2019 8:55:34 AM   Radiology DG Chest Port 1 View  Result Date: 06/14/2019 CLINICAL DATA:  Shortness of breath.  COVID-19 positive EXAM: PORTABLE CHEST 1 VIEW COMPARISON:  June 10, 2019 FINDINGS: Lungs are clear. Heart size and pulmonary vascularity are normal. No adenopathy. There is mild degenerative change in the thoracic spine. IMPRESSION: Lungs clear.  Stable cardiac silhouette.  No adenopathy. Electronically Signed   By: Lowella Grip III M.D.   On: 06/14/2019 10:14    Procedures Procedures (including critical care time)  Medications Ordered in ED Medications  dexamethasone (DECADRON) tablet 6 mg (has no administration in time range)  calcitRIOL (ROCALTROL) capsule 0.5 mcg (has no administration in time range)  sevelamer carbonate (RENVELA) tablet 2,400 mg (has no administration in time range)  sodium chloride flush (NS) 0.9 % injection 3 mL (has no administration in time range)  acetaminophen (TYLENOL) tablet 650 mg (has no administration in time range)    Or  acetaminophen (TYLENOL) suppository 650 mg (has no administration in time range)  ondansetron (ZOFRAN) tablet 4 mg (has no administration in time range)  Or  ondansetron (ZOFRAN) injection 4 mg (has no administration in time  range)  sevelamer carbonate (RENVELA) tablet 800 mg (has no administration in time range)  remdesivir 100 mg in sodium chloride 0.9 % 100 mL IVPB (has no administration in time range)  pantoprazole (PROTONIX) injection 40 mg (has no administration in time range)  pantoprazole (PROTONIX) 80 mg in sodium chloride 0.9 % 100 mL IVPB (0 mg Intravenous Stopped 06/14/19 0933)  cefTRIAXone (ROCEPHIN) 2 g in sodium chloride 0.9 % 100 mL IVPB (0 g Intravenous Stopped 06/14/19 0933)    ED Course  I have reviewed the triage vital signs and the nursing notes.  Pertinent labs & imaging results that were available during my care of the patient were reviewed by me and considered in my medical decision making (see chart for details).  Clinical Course as of Jun 13 1540  Wed Jun 14, 2019  0848 FOBT pos.   Fecal Occult Blood, POC(!): POSITIVE [AH]    Clinical Course User Index [AH] Margarita Mail, PA-C   MDM Rules/Calculators/A&P                      69 year old male here with recent Covid infection, end-stage renal disease on peritoneal dialysis at home.  He did not finish his dialysis this morning.  Patient had 2 syncopal events prior to arrival.  He has had intractable vomiting.The emergent differential diagnosis for vomiting includes, but is not limited to ACS/MI, Boerhaave's, DKA, Intracranial Hemorrhage, Ischemic bowel, Meningitis, Sepsis, Acute radiation syndrome, Acute gastric dilation, Acetaminophen toxicity, Adrenal insufficiency, Appendicitis, Aspirin toxicity, Bowel obstruction/ileus, Carbon monoxide poisoning, Cholecystitis, CNS tumor. Digoxin toxicity, Electrolyte abnormalities, Elevated ICP, Gastric outlet obstruction, Hyperemesis gravidarum, Pancreatitis, Peritonitis, Ruptured viscus, Testicular torsion/ovarian torsion, Theophyline toxicity, Biliary colic, Cannabinoid hyperemesis syndrome, Chemotherapy, Disulfiram effect, Erythromycin, ETOH, Gastritis, Gastroenteritis, Gastroparesis, Hepatitis,  Ibuprofen, Ipecac toxicity, Labyrinthitis, Migraine, Motion sickness, Narcotic withdrawal, Thyroid, Pregnancy, Peptic ulcer disease, Renal colic, and UTI Patient's abdomen is soft and nontender.  I doubt peritonitis.  Patient's occult stool is positive however currently his hemoglobin level is stable at 11.  He has had no precipitous drop.  Patient CMP shows elevated BUN and creatinine consistent with a end-stage renal disease.  He has an anion gap of 20 which I think is secondary to his BUN of 80.  Patient blood sugar is mildly elevated.  I reviewed the patient's chest x-ray which shows no acute abnormality on my interpretation.  Patient's EKG shows normal sinus rhythm at a rate of 60 without acute change since previous tracing.  I have admitted the patient to Dr. Darci Current.  Think you will need observation and serial hemoglobins make sure he is not dropping.  Patient started on IV Protonix. Final Clinical Impression(s) / ED Diagnoses Final diagnoses:  Syncope, unspecified syncope type  Gastrointestinal hemorrhage, unspecified gastrointestinal hemorrhage type    Rx / DC Orders ED Discharge Orders    None       Margarita Mail, PA-C 06/14/19 1544    Dorie Rank, MD 06/15/19 [phone]

## 2019-06-14 NOTE — Progress Notes (Signed)
Spoke w RN and MRI - pt will be going down for STAT MRI around 1445, told RN we would check back once pt is back from MRI.

## 2019-06-14 NOTE — Progress Notes (Addendum)
[facility] Progress Note   Subjective:   ESRD patient on PD presented to the ED due to syncope and n/v/d. Syncopal event witnessed by EMS this AM as well as coffee ground emesis.    Recent admission from 1/23-1/25/21 due to COVID 19 infection, hypoxia and weakness with falls.  Discharged home once stable and weaned of O2 with Po decadron and Remdesivir infusion scheduled as outpatient.  Pertinent findings in the ED include +FOBT, Hgb 11.0, K 3.2, and CXR with no acute findings.  Patient admitted to observation for further evaluation and management.   Pt seen in room, no current c/o's, denies any cloudy fluid or sig abd pain, no SOB, chest pain or cough.  Recently went from 2 x 1.5% bag and 1 2.5% bag, to all 1.5% bags.   Objective Vitals:   06/14/19 1145 06/14/19 1200 06/14/19 1215 06/14/19 1250  BP: 113/66 120/65 111/64   Pulse: 66 69 67 75  Resp: 20 18 18 15   Temp:      TempSrc:      SpO2: 98% 98% 97% 99%  Height:       Physical Exam General:alert, thin AAM no distress Heart:reg no mrg Lungs:CTA bilat to the bases Abdomen soft, ntnd, +bs, PD cath exit site c&d Extremities:no LE edema    Additional Objective Labs: Basic Metabolic Panel: Recent Labs  Lab 06/11/19 0239 06/12/19 0605 06/14/19 0822  NA 134* 134* 134*  K 4.4 4.1 3.2*  CL 97* 94* 92*  CO2 23 22 22   GLUCOSE 279* 241* 272*  BUN 54* 66* 80*  CREATININE 12.01* 12.84* 13.35*  CALCIUM 8.2* 8.4* 8.2*  PHOS 6.4* 7.7*  --    Liver Function Tests: Recent Labs  Lab 06/11/19 0239 06/12/19 0605 06/14/19 0822  AST 18 17 37  ALT 14 17 43  ALKPHOS 37* 39 40  BILITOT 0.3 0.8 0.3  PROT 6.4* 6.5 6.6  ALBUMIN 2.2* 2.5* 2.8*   Recent Labs  Lab 06/14/19 0822  LIPASE 57*   CBC: Recent Labs  Lab 06/10/19 0241 06/10/19 0241 06/10/19 0740 06/10/19 0740 06/11/19 0239 06/12/19 0605 06/14/19 0822  WBC 9.3   < > 7.8   < > 5.2 8.5 7.8  NEUTROABS 6.1   < > 4.9   < > 4.1 6.9 6.2  HGB 11.7*   <  > 10.7*   < > 10.7* 10.9* 11.0*  HCT 37.1*   < > 32.8*   < > 32.7* 32.0* 32.4*  MCV 92.5  --  90.4  --  89.1 85.3 88.5  PLT 237   < > 211   < > 244 280 281   < > = values in this interval not displayed.   Blood Culture    Component Value Date/Time   SDES BLOOD LEFT HAND 06/14/2019 0854   SPECREQUEST  06/14/2019 0854    BOTTLES DRAWN AEROBIC ONLY Blood Culture results may not be optimal due to an inadequate volume of blood received in culture bottles   CULT  06/14/2019 0854    NO GROWTH < 12 HOURS Performed at [facility], [place] [address] [place], [place] [address]    REPTSTATUS PENDING 06/14/2019 0854    CBG: Recent Labs  Lab 06/11/19 1158 06/11/19 1606 06/11/19 2114 06/12/19 0733 06/12/19 1107  GLUCAP 98 153* 215* 210* 84   Iron Studies:  Recent Labs    06/12/19 0605  FERRITIN 1,761*   Lab Results  Component Value Date   INR  1.1 06/14/2019   Studies/Results: DG Chest Port 1 View  Result Date: 06/14/2019 CLINICAL DATA:  Shortness of breath.  COVID-19 positive EXAM: PORTABLE CHEST 1 VIEW COMPARISON:  June 10, 2019 FINDINGS: Lungs are clear. Heart size and pulmonary vascularity are normal. No adenopathy. There is mild degenerative change in the thoracic spine. IMPRESSION: Lungs clear.  Stable cardiac silhouette.  No adenopathy. Electronically Signed   By: Lowella Grip III M.D.   On: 06/14/2019 10:14    Medications: . pantoprozole (PROTONIX) infusion 8 mg/hr (06/14/19 0935)  . remdesivir 100 mg in NS 100 mL     . calcitRIOL  0.5 mcg Oral Daily  . dexamethasone  6 mg Oral Daily  . sevelamer carbonate  2,400 mg Oral TID WC  . sodium chloride flush  3 mL Intravenous Q12H    Home meds:  - amlodipine 5/ telmisartan 40 / torsemide 100 qd  - aspirin 81  - sevelamer carb ac tid  - prn's/ vitamins/ supplements  Dialysis Orders: CCPD, 5 exchanges, 3027mL fill volume, 1hr 76min fill time, no day dwell  Assessment/Plan: 1. Syncope - will use lowest UF  fluids. Give NS boluses prn.   2. Hematemesis - coffee ground emesis reported.  +FOBT. Hgb at goal 11.0.  Follow trends. 3. ESRD - on PD.  Will write orders for PD nightly while admitted.  K 3.2 4. Anemia of CKD- Hgb in goal at 11.0. No indication for ESA at this time 5. Secondary hyperparathyroidism - Ca at goal.  Will check phos. Continue binders and VDRA 6. HTN/volume - Blood pressure well controlled. 7. Nutrition - Renal diet w/fluid restrictions.   Jen Mow, PA-C [facility] Pager: [phone] 06/14/2019,1:21 PM  LOS: 0 days   Pt seen, examined and agree w A/P as above.  Kelly Splinter  MD 06/14/2019, 4:56 PM

## 2019-06-14 NOTE — ED Triage Notes (Signed)
Pt from home here via EMS, pt just d/c from hospital Monday dx COVID. Pt here today c/o nausea , vomiting, and diarrhea. Coffee ground emesis noted as well. On EMS arrival pt was receiving peritoneal dialysis,pt had a syncopal episode lasting about 20 seconds at [facility] witnessed EMS.#18 Lfoearm. 4mg  Zofran given by EMS. Right arm restricted, fistula. Last VS: BP 130/56, TEMP 98.8 CBG 230. P 60.

## 2019-06-15 DIAGNOSIS — E876 Hypokalemia: Secondary | ICD-10-CM | POA: Diagnosis present

## 2019-06-15 DIAGNOSIS — R569 Unspecified convulsions: Secondary | ICD-10-CM

## 2019-06-15 DIAGNOSIS — U071 COVID-19: Secondary | ICD-10-CM | POA: Diagnosis present

## 2019-06-15 DIAGNOSIS — D62 Acute posthemorrhagic anemia: Secondary | ICD-10-CM | POA: Diagnosis present

## 2019-06-15 DIAGNOSIS — E1165 Type 2 diabetes mellitus with hyperglycemia: Secondary | ICD-10-CM | POA: Diagnosis present

## 2019-06-15 DIAGNOSIS — M1990 Unspecified osteoarthritis, unspecified site: Secondary | ICD-10-CM | POA: Diagnosis present

## 2019-06-15 DIAGNOSIS — G9340 Encephalopathy, unspecified: Secondary | ICD-10-CM | POA: Diagnosis present

## 2019-06-15 DIAGNOSIS — Z9181 History of falling: Secondary | ICD-10-CM | POA: Diagnosis not present

## 2019-06-15 DIAGNOSIS — K591 Functional diarrhea: Secondary | ICD-10-CM | POA: Diagnosis present

## 2019-06-15 DIAGNOSIS — I120 Hypertensive chronic kidney disease with stage 5 chronic kidney disease or end stage renal disease: Secondary | ICD-10-CM | POA: Diagnosis present

## 2019-06-15 DIAGNOSIS — R55 Syncope and collapse: Secondary | ICD-10-CM | POA: Diagnosis present

## 2019-06-15 DIAGNOSIS — N2581 Secondary hyperparathyroidism of renal origin: Secondary | ICD-10-CM | POA: Diagnosis present

## 2019-06-15 DIAGNOSIS — K920 Hematemesis: Secondary | ICD-10-CM | POA: Diagnosis present

## 2019-06-15 DIAGNOSIS — I951 Orthostatic hypotension: Secondary | ICD-10-CM | POA: Diagnosis present

## 2019-06-15 DIAGNOSIS — D631 Anemia in chronic kidney disease: Secondary | ICD-10-CM | POA: Diagnosis present

## 2019-06-15 DIAGNOSIS — E1122 Type 2 diabetes mellitus with diabetic chronic kidney disease: Secondary | ICD-10-CM | POA: Diagnosis present

## 2019-06-15 DIAGNOSIS — N186 End stage renal disease: Secondary | ICD-10-CM | POA: Diagnosis present

## 2019-06-15 DIAGNOSIS — T380X6A Underdosing of glucocorticoids and synthetic analogues, initial encounter: Secondary | ICD-10-CM | POA: Diagnosis present

## 2019-06-15 DIAGNOSIS — J1282 Pneumonia due to coronavirus disease 2019: Secondary | ICD-10-CM | POA: Diagnosis present

## 2019-06-15 DIAGNOSIS — E1142 Type 2 diabetes mellitus with diabetic polyneuropathy: Secondary | ICD-10-CM | POA: Diagnosis present

## 2019-06-15 LAB — GLUCOSE, CAPILLARY
Glucose-Capillary: 169 mg/dL — ABNORMAL HIGH (ref 70–99)
Glucose-Capillary: 118 mg/dL — ABNORMAL HIGH (ref 70–99)
Glucose-Capillary: 192 mg/dL — ABNORMAL HIGH (ref 70–99)
Glucose-Capillary: 227 mg/dL — ABNORMAL HIGH (ref 70–99)

## 2019-06-15 LAB — RENAL FUNCTION PANEL
Albumin: 2.2 g/dL — ABNORMAL LOW (ref 3.5–5.0)
Anion gap: 20 — ABNORMAL HIGH (ref 5–15)
BUN: 67 mg/dL — ABNORMAL HIGH (ref 8–23)
CO2: 23 mmol/L (ref 22–32)
Calcium: 7.7 mg/dL — ABNORMAL LOW (ref 8.9–10.3)
Chloride: 94 mmol/L — ABNORMAL LOW (ref 98–111)
Creatinine, Ser: 11.58 mg/dL — ABNORMAL HIGH (ref 0.61–1.24)
GFR calc Af Amer: 5 mL/min — ABNORMAL LOW (ref >60)
GFR calc non Af Amer: 4 mL/min — ABNORMAL LOW (ref >60)
Glucose, Bld: 216 mg/dL — ABNORMAL HIGH (ref 70–99)
Phosphorus: 6.4 mg/dL — ABNORMAL HIGH (ref 2.5–4.6)
Potassium: 3 mmol/L — ABNORMAL LOW (ref 3.5–5.1)
Sodium: 137 mmol/L (ref 135–145)

## 2019-06-15 LAB — CBC
HCT: 29.3 % — ABNORMAL LOW (ref 39.0–52.0)
Hemoglobin: 10.1 g/dL — ABNORMAL LOW (ref 13.0–17.0)
MCH: 29.4 pg (ref 26.0–34.0)
MCHC: 34.5 g/dL (ref 30.0–36.0)
MCV: 85.2 fL (ref 80.0–100.0)
Platelets: 236 10*3/uL (ref 150–400)
RBC: 3.44 MIL/uL — ABNORMAL LOW (ref 4.22–5.81)
RDW: 12.6 % (ref 11.5–15.5)
WBC: 7.5 10*3/uL (ref 4.0–10.5)
nRBC: 0 % (ref 0.0–0.2)

## 2019-06-15 LAB — CULTURE, BLOOD (ROUTINE X 2)
Culture: NO GROWTH
Culture: NO GROWTH

## 2019-06-15 MED ORDER — DELFLEX-LC/1.5% DEXTROSE 344 MOSM/L IP SOLN: INTRAPERITONEAL | Status: DC

## 2019-06-15 MED ORDER — HEPARIN 1000 UNIT/ML FOR PERITONEAL DIALYSIS
INTRAPERITONEAL | Status: DC | PRN
  Filled 2019-06-15: qty 5000

## 2019-06-15 MED ORDER — GENTAMICIN SULFATE 0.1 % EX CREA
1.0000 "application " | TOPICAL_CREAM | Freq: Every day | CUTANEOUS | Status: DC
  Administered 2019-06-15: 1 via TOPICAL

## 2019-06-15 MED ORDER — POTASSIUM CHLORIDE CRYS ER 20 MEQ PO TBCR
30.0000 meq | EXTENDED_RELEASE_TABLET | Freq: Three times a day (TID) | ORAL | Status: AC
  Administered 2019-06-15 (×2): 30 meq via ORAL
  Filled 2019-06-15 (×2): qty 1

## 2019-06-15 MED ORDER — DELFLEX-LC/1.5% DEXTROSE 344 MOSM/L IP SOLN
INTRAPERITONEAL | Status: DC
  Administered 2019-06-15: 5000 mL via INTRAPERITONEAL

## 2019-06-15 MED ORDER — PANTOPRAZOLE SODIUM 40 MG PO TBEC
40.0000 mg | DELAYED_RELEASE_TABLET | Freq: Two times a day (BID) | ORAL | Status: DC
  Administered 2019-06-16: 40 mg via ORAL
  Filled 2019-06-15: qty 1

## 2019-06-15 MED ORDER — HEPARIN 1000 UNIT/ML FOR PERITONEAL DIALYSIS: 500.0000 [IU] | INTRAMUSCULAR | Status: DC | PRN

## 2019-06-15 NOTE — Progress Notes (Addendum)
60 Spoke with wife and updated on condition. Requested to speak to MD. Notified MD to contact wife when possible. All questions answered.  ---  1220 Notified dialysis of orders for PD. Stated this is due tonight and will come disconnect patient's finished tx and begin this new order this evening. Retimed.

## 2019-06-15 NOTE — Progress Notes (Signed)
[facility] Progress Note   Subjective:   ESRD patient on PD presented to the ED due to syncope and n/v/d. Syncopal event witnessed by EMS this AM as well as coffee ground emesis.    Recent admission from 1/23-1/25/21 due to COVID 19 infection, hypoxia and weakness with falls.  Discharged home once stable and weaned of O2 with Po decadron and Remdesivir infusion scheduled as outpatient.  Pertinent findings in the ED include +FOBT, Hgb 11.0, K 3.2, and CXR with no acute findings.  Patient admitted to observation for further evaluation and management.    Patient not examined today directly given COVID-19 + status, utilizing data taken from chart +/- discussions w/ providers and staff.    Objective Vitals:   06/14/19 1850 06/14/19 2115 06/15/19 0400 06/15/19 0800  BP: 114/60 (!) 118/58 (!) 104/52 (!) 135/54  Pulse: 72 85 77 77  Resp: 14 15 11 13   Temp: (!) 97.5 F (36.4 C) 98.7 F (37.1 C) 98.4 F (36.9 C) 98.5 F (36.9 C)  TempSrc: Oral Oral Oral Oral  SpO2: 97% 96% 94% 94%  Weight: 72.2 kg     Height:       Physical Exam  Patient not examined today directly given COVID-19 + status, utilizing data taken from chart +/- discussions w/ providers and staff.     Additional Objective Labs: Basic Metabolic Panel: Recent Labs  Lab 06/11/19 0239 06/11/19 0239 06/12/19 0605 06/14/19 0822 06/15/19 0332  NA 134*   < > 134* 134* 137  K 4.4   < > 4.1 3.2* 3.0*  CL 97*   < > 94* 92* 94*  CO2 23   < > 22 22 23   GLUCOSE 279*   < > 241* 272* 216*  BUN 54*   < > 66* 80* 67*  CREATININE 12.01*   < > 12.84* 13.35* 11.58*  CALCIUM 8.2*   < > 8.4* 8.2* 7.7*  PHOS 6.4*  --  7.7*  --  6.4*   < > = values in this interval not displayed.   Liver Function Tests: Recent Labs  Lab 06/11/19 0239 06/11/19 0239 06/12/19 0605 06/14/19 0822 06/15/19 0332  AST 18  --  17 37  --   ALT 14  --  17 43  --   ALKPHOS 37*  --  39 40  --   BILITOT 0.3  --  0.8 0.3  --   PROT 6.4*   --  6.5 6.6  --   ALBUMIN 2.2*   < > 2.5* 2.8* 2.2*   < > = values in this interval not displayed.   Recent Labs  Lab 06/14/19 0822  LIPASE 57*   CBC: Recent Labs  Lab 06/10/19 0740 06/10/19 0740 06/11/19 0239 06/11/19 0239 06/12/19 0605 06/12/19 0605 06/14/19 0822 06/14/19 1655 06/15/19 0332  WBC 7.8   < > 5.2   < > 8.5  --  7.8  --  7.5  NEUTROABS 4.9   < > 4.1  --  6.9  --  6.2  --   --   HGB 10.7*   < > 10.7*   < > 10.9*   < > 11.0* 10.9* 10.1*  HCT 32.8*   < > 32.7*   < > 32.0*   < > 32.4* 32.0* 29.3*  MCV 90.4  --  89.1  --  85.3  --  88.5  --  85.2  PLT 211   < > 244   < > 280  --  281  --  236   < > = values in this interval not displayed.   Blood Culture    Component Value Date/Time   SDES BLOOD LEFT HAND 06/14/2019 0854   SPECREQUEST  06/14/2019 0854    BOTTLES DRAWN AEROBIC ONLY Blood Culture results may not be optimal due to an inadequate volume of blood received in culture bottles   CULT  06/14/2019 0854    NO GROWTH 1 DAY Performed at [facility], [place] [address], [place], [place] [address]    REPTSTATUS PENDING 06/14/2019 0854    CBG: Recent Labs  Lab 06/11/19 2114 06/12/19 0733 06/12/19 1107 06/14/19 2113 06/15/19 0803  GLUCAP 215* 210* 84 140* 169*    Medications: . dialysis solution 1.5% low-MG/low-CA     . calcitRIOL  0.5 mcg Oral Daily  . dexamethasone  6 mg Oral Daily  . gentamicin cream  1 application Topical Daily  . insulin aspart  0-5 Units Subcutaneous QHS  . insulin aspart  0-6 Units Subcutaneous TID WC  . pantoprazole (PROTONIX) IV  40 mg Intravenous Q12H  . sevelamer carbonate  2,400 mg Oral TID WC  . sodium chloride flush  3 mL Intravenous Q12H    Home meds:  - amlodipine 5/ telmisartan 40 / torsemide 100 qd  - aspirin 81  - sevelamer carb ac tid  - prn's/ vitamins/ supplements  Dialysis Orders: CCPD, 5 exchanges, 3061mL fill volume, 1hr 62min fill time, no day dwell, 70kg  Assessment/Plan: 1. Syncope/ poss  seizures - in ED.  Cont 1.5% for PD fluids 2. Hematemesis - coffee ground emesis reported.  +FOBT. Hgb at goal 11.0.  Follow trends. 3. ESRD - on PD.  Will write orders for PD nightly while admitted. Low K, ordered 30 bid today kdur 4. Anemia of CKD- Hgb in goal at 11.0. No indication for ESA at this time 5. Secondary hyperparathyroidism - Ca at goal.  Will check phos. Continue binders and VDRA 6. HTN/volume - Blood pressure well controlled, not getting home meds (ARB, norvasc) 7. Nutrition - Renal diet w/fluid restrictions.    Kelly Splinter  MD 06/15/2019, 10:20 AM

## 2019-06-15 NOTE — Discharge Instructions (Addendum)
COVID-19: Quarantine vs. Isolation QUARANTINE keeps someone who was in close contact with someone who has COVID-19 away from others. If you had close contact with a person who has COVID-19  Stay home until 14 days after your last contact.  Check your temperature twice a day and watch for symptoms of COVID-19.  If possible, stay away from people who are at higher-risk for getting very sick from COVID-19. ISOLATION keeps someone who is sick or tested positive for COVID-19 without symptoms away from others, even in their own home. If you are sick and think or know you have COVID-19  Stay home until after ? At least 10 days since symptoms first appeared and ? At least 24 hours with no fever without fever-reducing medication and ? Symptoms have improved If you tested positive for COVID-19 but do not have symptoms  Stay home until after ? 10 days have passed since your positive test If you live with others, stay in a specific "sick room" or area and away from other people or animals, including pets. Use a separate bathroom, if available. [email or web address] 12/05/2018 This information is not intended to replace advice given to you by your health care provider. Make sure you discuss any questions you have with your health care provider. Document Revised: 04/20/2019 Document Reviewed: 04/20/2019 Elsevier Patient Education  [place] Under Monitoring Name: [name] [name]  Location: [place] [address]   Infection Prevention Recommendations for Individuals Confirmed to have, or Being Evaluated for, 2019 Novel Coronavirus (COVID-19) Infection Who Receive Care at Home  Individuals who are confirmed to have, or are being evaluated for, COVID-19 should follow the prevention steps below until a healthcare provider or local or state health department says they can return to normal activities.  Stay home except to get medical care You should  restrict activities outside your home, except for getting medical care. Do not go to work, school, or public areas, and do not use public transportation or taxis.  Call ahead before visiting your doctor Before your medical appointment, call the healthcare provider and tell them that you have, or are being evaluated for, COVID-19 infection. This will help the healthcare providers office take steps to keep other people from getting infected. Ask your healthcare provider to call the local or state health department.  Monitor your symptoms Seek prompt medical attention if your illness is worsening (e.g., difficulty breathing). Before going to your medical appointment, call the healthcare provider and tell them that you have, or are being evaluated for, COVID-19 infection. Ask your healthcare provider to call the local or state health department.  Wear a facemask You should wear a facemask that covers your nose and mouth when you are in the same room with other people and when you visit a healthcare provider. People who live with or visit you should also wear a facemask while they are in the same room with you.  Separate yourself from other people in your home As much as possible, you should stay in a different room from other people in your home. Also, you should use a separate bathroom, if available.  Avoid sharing household items You should not share dishes, drinking glasses, cups, eating utensils, towels, bedding, or other items with other people in your home. After using these items, you should wash them thoroughly with soap and water.  Cover your coughs and sneezes Cover your mouth and nose with a tissue when you cough or sneeze,  or you can cough or sneeze into your sleeve. Throw used tissues in a lined trash can, and immediately wash your hands with soap and water for at least 20 seconds or use an alcohol-based hand rub.  Wash your [facility] your hands often and thoroughly with  soap and water for at least 20 seconds. You can use an alcohol-based hand sanitizer if soap and water are not available and if your hands are not visibly dirty. Avoid touching your eyes, nose, and mouth with unwashed hands.   Prevention Steps for Caregivers and Household Members of Individuals Confirmed to have, or Being Evaluated for, COVID-19 Infection Being Cared for in the Home  If you live with, or provide care at home for, a person confirmed to have, or being evaluated for, COVID-19 infection please follow these guidelines to prevent infection:  Follow healthcare providers instructions Make sure that you understand and can help the patient follow any healthcare provider instructions for all care.  Provide for the patients basic needs You should help the patient with basic needs in the home and provide support for getting groceries, prescriptions, and other personal needs.  Monitor the patients symptoms If they are getting sicker, call his or her medical provider and tell them that the patient has, or is being evaluated for, COVID-19 infection. This will help the healthcare providers office take steps to keep other people from getting infected. Ask the healthcare provider to call the local or state health department.  Limit the number of people who have contact with the patient  If possible, have only one caregiver for the patient.  Other household members should stay in another home or place of residence. If this is not possible, they should stay  in another room, or be separated from the patient as much as possible. Use a separate bathroom, if available.  Restrict visitors who do not have an essential need to be in the home.  Keep older adults, very young children, and other sick people away from the patient Keep older adults, very young children, and those who have compromised immune systems or chronic health conditions away from the patient. This includes people with  chronic heart, lung, or kidney conditions, diabetes, and cancer.  Ensure good ventilation Make sure that shared spaces in the home have good air flow, such as from an air conditioner or an opened window, weather permitting.  Wash your hands often  Wash your hands often and thoroughly with soap and water for at least 20 seconds. You can use an alcohol based hand sanitizer if soap and water are not available and if your hands are not visibly dirty.  Avoid touching your eyes, nose, and mouth with unwashed hands.  Use disposable paper towels to dry your hands. If not available, use dedicated cloth towels and replace them when they become wet.  Wear a facemask and gloves  Wear a disposable facemask at all times in the room and gloves when you touch or have contact with the patients blood, body fluids, and/or secretions or excretions, such as sweat, saliva, sputum, nasal mucus, vomit, urine, or feces.  Ensure the mask fits over your nose and mouth tightly, and do not touch it during use.  Throw out disposable facemasks and gloves after using them. Do not reuse.  Wash your hands immediately after removing your facemask and gloves.  If your personal clothing becomes contaminated, carefully remove clothing and launder. Wash your hands after handling contaminated clothing.  Place all used disposable  facemasks, gloves, and other waste in a lined container before disposing them with other household waste.  Remove gloves and wash your hands immediately after handling these items.  Do not share dishes, glasses, or other household items with the patient  Avoid sharing household items. You should not share dishes, drinking glasses, cups, eating utensils, towels, bedding, or other items with a patient who is confirmed to have, or being evaluated for, COVID-19 infection.  After the person uses these items, you should wash them thoroughly with soap and water.  Wash laundry thoroughly  Immediately  remove and wash clothes or bedding that have blood, body fluids, and/or secretions or excretions, such as sweat, saliva, sputum, nasal mucus, vomit, urine, or feces, on them.  Wear gloves when handling laundry from the patient.  Read and follow directions on labels of laundry or clothing items and detergent. In general, wash and dry with the warmest temperatures recommended on the label.  Clean all areas the individual has used often  Clean all touchable surfaces, such as counters, tabletops, doorknobs, bathroom fixtures, toilets, phones, keyboards, tablets, and bedside tables, every day. Also, clean any surfaces that may have blood, body fluids, and/or secretions or excretions on them.  Wear gloves when cleaning surfaces the patient has come in contact with.  Use a diluted bleach solution (e.g., dilute bleach with 1 part bleach and 10 parts water) or a household disinfectant with a label that says EPA-registered for coronaviruses. To make a bleach solution at home, add 1 tablespoon of bleach to 1 quart (4 cups) of water. For a larger supply, add  cup of bleach to 1 gallon (16 cups) of water.  Read labels of cleaning products and follow recommendations provided on product labels. Labels contain instructions for safe and effective use of the cleaning product including precautions you should take when applying the product, such as wearing gloves or eye protection and making sure you have good ventilation during use of the product.  Remove gloves and wash hands immediately after cleaning.  Monitor yourself for signs and symptoms of illness Caregivers and household members are considered close contacts, should monitor their health, and will be asked to limit movement outside of the home to the extent possible. Follow the monitoring steps for close contacts listed on the symptom monitoring form.   ? If you have additional questions, contact your local health department or call the epidemiologist  on call at [phone] (available 24/7). ? This guidance is subject to change. For the most up-to-date guidance from [facility], please refer to their website: [email or web address].pl   Person Under Monitoring Name: [name] [name]  Location: [place] [address]   CORONAVIRUS DISEASE 2019 (COVID-19) Guidance for Persons Under Investigation You are being tested for the virus that causes coronavirus disease 2019 (COVID-19). Public health actions are necessary to ensure protection of your health and the health of others, and to prevent further spread of infection. COVID-19 is caused by a virus that can cause symptoms, such as fever, cough, and shortness of breath. The primary transmission from person to person is by coughing or sneezing. On June 16, 2018, the [place] announced a [employer] Emergency of International Concern and on June 17, 2018 the U.[place]. Department of Health and Human Services declared a public health emergency. If the virus that causesCOVID-19 spreads in the community, it could have severe public health consequences.  As a person under investigation for COVID-19, the [employer] and [employer], Division of Public Health advises you to adhere to the following guidance until your test results are reported to you. If your test result is positive, you will receive additional information from your provider and your local health department at that time.   Remain at home until you are cleared by your health provider or public health authorities.   Keep a log of visitors to your home using the form provided. Any visitors to your home must be aware of your isolation status.  If you plan to move to a new address or leave the county, notify the local health department in your county.  Call a doctor or seek care if you have an urgent medical need. Before seeking  medical care, call ahead and get instructions from the provider before arriving at the medical office, clinic or hospital. Notify them that you are being tested for the virus that causes COVID-19 so arrangements can be made, as necessary, to prevent transmission to others in the healthcare setting. Next, notify the local health department in your county.  If a medical emergency arises and you need to call 911, inform the first responders that you are being tested for the virus that causes COVID-19. Next, notify the local health department in your county.  Adhere to all guidance set forth by the [place] for [employer] of patients that is based on guidance from the Center for Disease Control and Prevention with suspected or confirmed COVID-19. It is provided with this guidance for Persons Under Investigation.  Your health and the health of our community are our top priorities. Public Health officials remain available to provide assistance and counseling to you about COVID-19 and compliance with this guidance.  Provider: ____________________________________________________________ Date: ______/_____/_________  By signing below, you acknowledge that you have read and agree to comply with this Guidance for Persons Under Investigation. ______________________________________________________________ Date: ______/_____/_________  WHO DO I CALL? You can find a list of local health departments here: [email or web address] Health Department: ____________________________________________________________________ Contact Name: ________________________________________________________________________ Telephone: ___________________________________________________________________________  [facility], [place], Communicable Disease Branch COVID-19 Guidance for Persons Under Investigation March 7,  2020COVID-19 COVID-19 is a respiratory infection that is caused by a virus called severe acute respiratory syndrome coronavirus 2 (SARS-CoV-2). The disease is also known as coronavirus disease or novel coronavirus. In some people, the virus may not cause any symptoms. In others, it may cause a serious infection. The infection can get worse quickly and can lead to complications, such as:  Pneumonia, or infection of the lungs.  Acute respiratory distress syndrome or ARDS. This is a condition in which fluid build-up in the lungs prevents the lungs from filling with air and passing oxygen into the blood.  Acute respiratory failure. This is a condition in which there is not enough oxygen passing from the lungs to the body or when carbon dioxide is not passing from the lungs out of the body.  Sepsis or septic shock. This is a serious bodily reaction to an infection.  Blood clotting problems.  Secondary infections due to bacteria or fungus.  Organ failure. This is when your body's organs stop working. The virus that causes COVID-19 is contagious. This means that it can spread from person to person through droplets from coughs and sneezes (respiratory secretions). What are the causes? This illness is caused by a virus. You may catch the virus by:  Breathing in droplets from an infected person. Droplets can be spread by a  person breathing, speaking, singing, coughing, or sneezing.  Touching something, like a table or a doorknob, that was exposed to the virus (contaminated) and then touching your mouth, nose, or eyes. What increases the risk? Risk for infection You are more likely to be infected with this virus if you:  Are within 6 feet (2 meters) of a person with COVID-19.  Provide care for or live with a person who is infected with COVID-19.  Spend time in crowded indoor spaces or live in shared housing. Risk for serious illness You are more likely to become seriously ill from the virus if  you:  Are 6 years of age or older. The higher your age, the more you are at risk for serious illness.  Live in a nursing home or long-term care facility.  Have cancer.  Have a long-term (chronic) disease such as: ? Chronic lung disease, including chronic obstructive pulmonary disease or asthma. ? A long-term disease that lowers your body's ability to fight infection (immunocompromised). ? Heart disease, including heart failure, a condition in which the arteries that lead to the heart become narrow or blocked (coronary artery disease), a disease which makes the heart muscle thick, weak, or stiff (cardiomyopathy). ? Diabetes. ? Chronic kidney disease. ? Sickle cell disease, a condition in which red blood cells have an abnormal "sickle" shape. ? Liver disease.  Are obese. What are the signs or symptoms? Symptoms of this condition can range from mild to severe. Symptoms may appear any time from 2 to 14 days after being exposed to the virus. They include:  A fever or chills.  A cough.  Difficulty breathing.  Headaches, body aches, or muscle aches.  Runny or stuffy (congested) nose.  A sore throat.  New loss of taste or smell. Some people may also have stomach problems, such as nausea, vomiting, or diarrhea. Other people may not have any symptoms of COVID-19. How is this diagnosed? This condition may be diagnosed based on:  Your signs and symptoms, especially if: ? You live in an area with a COVID-19 outbreak. ? You recently traveled to or from an area where the virus is common. ? You provide care for or live with a person who was diagnosed with COVID-19. ? You were exposed to a person who was diagnosed with COVID-19.  A physical exam.  Lab tests, which may include: ? Taking a sample of fluid from the back of your nose and throat (nasopharyngeal fluid), your nose, or your throat using a swab. ? A sample of mucus from your lungs (sputum). ? Blood tests.  Imaging tests,  which may include, X-rays, CT scan, or ultrasound. How is this treated? At present, there is no medicine to treat COVID-19. Medicines that treat other diseases are being used on a trial basis to see if they are effective against COVID-19. Your health care provider will talk with you about ways to treat your symptoms. For most people, the infection is mild and can be managed at home with rest, fluids, and over-the-counter medicines. Treatment for a serious infection usually takes places in a hospital intensive care unit (ICU). It may include one or more of the following treatments. These treatments are given until your symptoms improve.  Receiving fluids and medicines through an IV.  Supplemental oxygen. Extra oxygen is given through a tube in the nose, a face mask, or a hood.  Positioning you to lie on your stomach (prone position). This makes it easier for oxygen to get into the  lungs.  Continuous positive airway pressure (CPAP) or bi-level positive airway pressure (BPAP) machine. This treatment uses mild air pressure to keep the airways open. A tube that is connected to a motor delivers oxygen to the body.  Ventilator. This treatment moves air into and out of the lungs by using a tube that is placed in your windpipe.  Tracheostomy. This is a procedure to create a hole in the neck so that a breathing tube can be inserted.  Extracorporeal membrane oxygenation (ECMO). This procedure gives the lungs a chance to recover by taking over the functions of the heart and lungs. It supplies oxygen to the body and removes carbon dioxide. Follow these instructions at home: Lifestyle  If you are sick, stay home except to get medical care. Your health care provider will tell you how long to stay home. Call your health care provider before you go for medical care.  Rest at home as told by your health care provider.  Do not use any products that contain nicotine or tobacco, such as cigarettes,  e-cigarettes, and chewing tobacco. If you need help quitting, ask your health care provider.  Return to your normal activities as told by your health care provider. Ask your health care provider what activities are safe for you. General instructions  Take over-the-counter and prescription medicines only as told by your health care provider.  Drink enough fluid to keep your urine pale yellow.  Keep all follow-up visits as told by your health care provider. This is important. How is this prevented?  There is no vaccine to help prevent COVID-19 infection. However, there are steps you can take to protect yourself and others from this virus. To protect yourself:   Do not travel to areas where COVID-19 is a risk. The areas where COVID-19 is reported change often. To identify high-risk areas and travel restrictions, check the CDC travel website: FatFares.com.br  If you live in, or must travel to, an area where COVID-19 is a risk, take precautions to avoid infection. ? Stay away from people who are sick. ? Wash your hands often with soap and water for 20 seconds. If soap and water are not available, use an alcohol-based hand sanitizer. ? Avoid touching your mouth, face, eyes, or nose. ? Avoid going out in public, follow guidance from your state and local health authorities. ? If you must go out in public, wear a cloth face covering or face mask. Make sure your mask covers your nose and mouth. ? Avoid crowded indoor spaces. Stay at least 6 feet (2 meters) away from others. ? Disinfect objects and surfaces that are frequently touched every day. This may include:  Counters and tables.  Doorknobs and light switches.  Sinks and faucets.  Electronics, such as phones, remote controls, keyboards, computers, and tablets. To protect others: If you have symptoms of COVID-19, take steps to prevent the virus from spreading to others.  If you think you have a COVID-19 infection, contact  your health care provider right away. Tell your health care team that you think you may have a COVID-19 infection.  Stay home. Leave your house only to seek medical care. Do not use public transport.  Do not travel while you are sick.  Wash your hands often with soap and water for 20 seconds. If soap and water are not available, use alcohol-based hand sanitizer.  Stay away from other members of your household. Let healthy household members care for children and pets, if possible. If you  have to care for children or pets, wash your hands often and wear a mask. If possible, stay in your own room, separate from others. Use a different bathroom.  Make sure that all people in your household wash their hands well and often.  Cough or sneeze into a tissue or your sleeve or elbow. Do not cough or sneeze into your hand or into the air.  Wear a cloth face covering or face mask. Make sure your mask covers your nose and mouth. Where to find more information  Centers for Disease Control and Prevention: PurpleGadgets.be  World Health Organization: https://www.castaneda.info/ Contact a health care provider if:  You live in or have traveled to an area where COVID-19 is a risk and you have symptoms of the infection.  You have had contact with someone who has COVID-19 and you have symptoms of the infection. Get help right away if:  You have trouble breathing.  You have pain or pressure in your chest.  You have confusion.  You have bluish lips and fingernails.  You have difficulty waking from sleep.  You have symptoms that get worse. These symptoms may represent a serious problem that is an emergency. Do not wait to see if the symptoms will go away. Get medical help right away. Call your local emergency services (911 in the [place] Do not drive yourself to the hospital. Let the emergency medical personnel know if you think you have  COVID-19. Summary  COVID-19 is a respiratory infection that is caused by a virus. It is also known as coronavirus disease or novel coronavirus. It can cause serious infections, such as pneumonia, acute respiratory distress syndrome, acute respiratory failure, or sepsis.  The virus that causes COVID-19 is contagious. This means that it can spread from person to person through droplets from breathing, speaking, singing, coughing, or sneezing.  You are more likely to develop a serious illness if you are 41 years of age or older, have a weak immune system, live in a nursing home, or have chronic disease.  There is no medicine to treat COVID-19. Your health care provider will talk with you about ways to treat your symptoms.  Take steps to protect yourself and others from infection. Wash your hands often and disinfect objects and surfaces that are frequently touched every day. Stay away from people who are sick and wear a mask if you are sick. This information is not intended to replace advice given to you by your health care provider. Make sure you discuss any questions you have with your health care provider. Document Revised: 03/03/2019 Document Reviewed: 06/09/2018 Elsevier Patient Education  [place]. Gastrointestinal Bleeding Gastrointestinal (GI) bleeding is bleeding somewhere along the path that food travels through the body (digestive tract). This path is anywhere between the mouth and the opening of the butt (anus). You may have blood in your poop (stool) or have black poop. If you throw up (vomit), there may be blood in it. This condition can be mild, serious, or even life-threatening. If you have a lot of bleeding, you may need to stay in the hospital. What are the causes? This condition may be caused by:  Irritation and swelling of the esophagus (esophagitis). The esophagus is part of the body that moves food from your mouth to your stomach.  Swollen veins in the butt  (hemorrhoids).  Areas of painful tearing in the opening of the butt (anal fissures). These are often caused by passing hard poop.  Pouches that form  on the colon over time (diverticulosis).  Irritation and swelling (diverticulitis) in areas where pouches have formed on the colon.  Growths (polyps) or cancer. Colon cancer often starts out as growths that are not cancer.  Irritation of the stomach lining (gastritis).  Sores (ulcers) in the stomach. What increases the risk? You are more likely to develop this condition if you:  Have a certain type of infection in your stomach (Helicobacter pylori infection).  Take certain medicines.  Smoke.  Drink alcohol. What are the signs or symptoms? Common symptoms of this condition include:  Throwing up (vomiting) material that has bright red blood in it. It may look like coffee grounds.  Changes in your poop. The poop may: ? Have red blood in it. ? Be black, look like tar, and smell stronger than normal. ? Be red.  Pain or cramping in the belly (abdomen). How is this treated? Treatment for this condition depends on the cause of the bleeding. For example:  Sometimes, the bleeding can be stopped during a procedure that is done to find the problem (endoscopy or colonoscopy).  Medicines can be used to: ? Help control irritation, swelling, or infection. ? Reduce acid in your stomach.  Certain problems can be treated with: ? Creams. ? Medicines that are put in the butt (suppositories). ? Warm baths.  Surgery is sometimes needed.  If you lose a lot of blood, you may need a blood transfusion. If bleeding is mild, you may be allowed to go home. If there is a lot of bleeding, you will need to stay in the hospital. Follow these instructions at home:   Take over-the-counter and prescription medicines only as told by your doctor.  Eat foods that have a lot of fiber in them. These foods include beans, whole grains, and fresh fruits and  vegetables. You can also try eating 1-3 prunes each day.  Drink enough fluid to keep your pee (urine) pale yellow.  Keep all follow-up visits as told by your doctor. This is important. Contact a doctor if:  Your symptoms do not get better. Get help right away if:  Your bleeding does not stop.  You feel dizzy or you pass out (faint).  You feel weak.  You have very bad cramps in your back or belly.  You pass large clumps of blood (clots) in your poop.  Your symptoms are getting worse.  You have chest pain or fast heartbeats. Summary  GI bleeding is bleeding somewhere along the path that food travels through the body (digestive tract).  This bleeding can be caused by many things. Treatment depends on the cause of the bleeding.  Take medicines only as told by your doctor.  Keep all follow-up visits as told by your doctor. This is important. This information is not intended to replace advice given to you by your health care provider. Make sure you discuss any questions you have with your health care provider. Document Revised: 12/15/2017 Document Reviewed: 12/15/2017 Elsevier Patient Education  [employer] [place].

## 2019-06-15 NOTE — Progress Notes (Addendum)
PROGRESS NOTE  CORDARRO SPINNATO UVO:[id number] DOB: [birth year] DOA: 06/14/2019 PCP: [name], [name], MD  HPI/Recap of past 24 hours:  [name] [name] is a 69 y.o. male with medical history significant of hypertension, ESRD onPD, DM type II with peripheral neuropathy, arthritis, anemia chronic disease, remote history of tobacco use, COVID-19 positive on 1/20, and functional diarrhea. History obtain from the patient with the assistance of his wife.  This morning his wife notes that she heard a noise and found him in the bathroom.  She reports that his hips were shaking, he was foaming at the mouth as if he was trying to throw up or cough, and his eyes were wide open.  Thereafter, he stopped moving and did not appear to be breathing for a few seconds before responding to his wife saying his name.   Patient recalls using the restroom this morning and then hearing his wife calling for him.  He did not fall all of the commode or sustained any trauma to his head.  Denies any personal history of seizures, but his daughter has a history of seizures.  Recently hospitalized from 1/23-1/25 for acute respiratory failure with hypoxia secondary to pneumonia due to COVID-19. He was supposed to get his last infusion of remdesivir today as well as another dose of Decadron.  Upon the patient getting home he reported having several episodes of vomiting.    Reports associated symptoms decreased appetite, hiccups, and diarrhea.  Denies having any significant abdominal pain. Wife notes that he had woken her up at 3 am stating that his systolic blood pressures were 85 and requesting something to eat.  He had improvement of blood pressure shortly thereafter.  His last colonoscopy was in 2012 and showed no signs of polyp.  He was seen by [facility]. Emesis reported to coffee-ground in appearance that was seen by EMS.  He had been given 4 mg of Zofran IV.  ED Course: On admission into the emergency department patient  was noted to be afebrile pulse 55-74, respirations 13-20, blood pressure 96/54-149/81, and O2 saturations currently maintained on room air.  Labs significant for hemoglobin 11, BUN 80, and creatinine 13.35.  Patient was given Rocephin IV and started on Protonix drip.  Neurology was formally consulted.  TRH called to admit.  06/15/19: Seen and examined.  Denies epigastric pain or nausea at this time.  No recurrent coffee-ground emesis or hematemesis.  Clear liquid diet advance to full liquid diet per GI.  GI also recommends IV Protonix 40 mg twice daily.  GI signed off.   Assessment/Plan: Principal Problem:   Hematemesis Active Problems:   Essential hypertension   Diabetes mellitus type 2, controlled ([place])   ESRD on peritoneal dialysis ([place])   COVID-19 virus infection   Syncope   Anemia of chronic disease   Hematemesis, unclear etiology with positive stool guaiac Presented with nausea and vomiting Seen by GI who recommended IV Protonix 40 mg twice daily and will advance to full liquid diet, diet advanced as tolerated.  Could advance to renal diet tomorrow morning if all goes well.  Start tomorrow oral Protonix 40 mg twice daily x10 days then once daily.  GI signed off.    Seizure versus syncope:  -Wife reports that it appeared that the patient was having a seizure and was foaming at the mouth, differential includes convulsive syncope. -Positive orthostatic vital signs -MRI brain no acute intracranial findings but shows evidence of chronic microvascular disease. -EEG no evidence  of seizures , but evidence of mild diffuse encephalopathy. -Continue seizure precautions  Upper GI bleed/coffee-ground emesis Management as stated above Mild drop in hemoglobin. Continue IV PPI as recommended by GI.  Acute blood loss anemia secondary to upper GI bleed Mild drop in H&H from 10.9-10.1. Continue to monitor H&H  Orthostatic hypotension Avoid dehydration Continue to closely monitor with fall  precautions PT to assess.Marland Kitchen  History of COVID-19 viral infection/pneumonia: Diagnosed with COVID-19 on 06/07/19.   Completed course of remdesivir. Continue Decadron  ESRD on peritoneal dialysis:  -Nephrology following.  Hypokalemia Potassium 3.0 Received potassium supplement per nephrology.  Anemia of chronic disease secondary to ESRD: \ Continue to monitor H&H  Essential hypertension:  Blood pressure stable Continue home medications Continue to monitor vital signs    Diabetes mellitus type 2, with hyperglycemia:  Hypoglycemia likely exacerbated by steroid use Last hemoglobin A1c noted to be 6.2 last month Avoid hypoglycemia.  DVT prophylaxis: SCDs  Code Status: Full Family Communication:  Updated his spouse on the phone on 06/15/2019.   Disposition Plan:  Patient is from home.  Anticipate discharge to home tomorrow 06/16/2019 after completing regimen recommended by GI.  Barrier to discharge IV PPI administration as recommended by GI.   Consults called: nephrology, GI    Objective: Vitals:   06/14/19 1850 06/14/19 2115 06/15/19 0400 06/15/19 0800  BP: 114/60 (!) 118/58 (!) 104/52 (!) 135/54  Pulse: 72 85 77 77  Resp: 14 15 11 13   Temp: (!) 97.5 F (36.4 C) 98.7 F (37.1 C) 98.4 F (36.9 C) 98.5 F (36.9 C)  TempSrc: Oral Oral Oral Oral  SpO2: 97% 96% 94% 94%  Weight: 72.2 kg     Height:        Intake/Output Summary (Last 24 hours) at 06/15/2019 1114 Last data filed at 06/15/2019 0900 Gross per 24 hour  Intake 889.92 ml  Output 0 ml  Net 889.92 ml   Filed Weights   06/14/19 1850  Weight: 72.2 kg    Exam:  . General: 69 y.o. year-old male well developed well nourished in no acute distress.  Alert and interactive. . Cardiovascular: Regular rate and rhythm with no rubs or gallops.  No thyromegaly or JVD noted.   Marland Kitchen Respiratory: Clear to auscultation with no wheezes or rales. Good inspiratory effort. . Abdomen: Soft nontender nondistended with normal  bowel sounds x4 quadrants. . Musculoskeletal: No lower extremity edema. 2/4 pulses in all 4 extremities. Marland Kitchen Psychiatry: Mood is appropriate for condition and setting   Data Reviewed: CBC: Recent Labs  Lab 06/10/19 0241 06/10/19 0241 06/10/19 0740 06/10/19 0740 06/11/19 0239 06/12/19 0605 06/14/19 0822 06/14/19 1655 06/15/19 0332  WBC 9.3   < > 7.8  --  5.2 8.5 7.8  --  7.5  NEUTROABS 6.1  --  4.9  --  4.1 6.9 6.2  --   --   HGB 11.7*   < > 10.7*   < > 10.7* 10.9* 11.0* 10.9* 10.1*  HCT 37.1*   < > 32.8*   < > 32.7* 32.0* 32.4* 32.0* 29.3*  MCV 92.5   < > 90.4  --  89.1 85.3 88.5  --  85.2  PLT 237   < > 211  --  244 280 281  --  236   < > = values in this interval not displayed.   Basic Metabolic Panel: Recent Labs  Lab 06/10/19 0241 06/11/19 0239 06/12/19 0605 06/14/19 0822 06/15/19 0332  NA 135 134* 134* 134*  137  K 3.2* 4.4 4.1 3.2* 3.0*  CL 96* 97* 94* 92* 94*  CO2 24 23 22 22 23   GLUCOSE 140* 279* 241* 272* 216*  BUN 45* 54* 66* 80* 67*  CREATININE 11.80* 12.01* 12.84* 13.35* 11.58*  CALCIUM 8.4* 8.2* 8.4* 8.2* 7.7*  MG  --  2.1 2.2  --   --   PHOS  --  6.4* 7.7*  --  6.4*   GFR: Estimated Creatinine Clearance: 6.1 mL/min (A) (by C-G formula based on SCr of 11.58 mg/dL (H)). Liver Function Tests: Recent Labs  Lab 06/10/19 0241 06/11/19 0239 06/12/19 0605 06/14/19 0822 06/15/19 0332  AST 15 18 17  37  --   ALT 13 14 17  43  --   ALKPHOS 41 37* 39 40  --   BILITOT 0.7 0.3 0.8 0.3  --   PROT 7.3 6.4* 6.5 6.6  --   ALBUMIN 2.8* 2.2* 2.5* 2.8* 2.2*   Recent Labs  Lab 06/14/19 0822  LIPASE 57*   Recent Labs  Lab 06/14/19 0822  AMMONIA 18   Coagulation Profile: Recent Labs  Lab 06/14/19 0905  INR 1.1   Cardiac Enzymes: No results for input(s): CKTOTAL, CKMB, CKMBINDEX, TROPONINI in the last 168 hours. BNP (last 3 results) No results for input(s): PROBNP in the last 8760 hours. HbA1C: No results for input(s): HGBA1C in the last 72  hours. CBG: Recent Labs  Lab 06/11/19 2114 06/12/19 0733 06/12/19 1107 06/14/19 2113 06/15/19 0803  GLUCAP 215* 210* 84 140* 169*   Lipid Profile: No results for input(s): CHOL, HDL, LDLCALC, TRIG, CHOLHDL, LDLDIRECT in the last 72 hours. Thyroid Function Tests: No results for input(s): TSH, T4TOTAL, FREET4, T3FREE, THYROIDAB in the last 72 hours. Anemia Panel: No results for input(s): VITAMINB12, FOLATE, FERRITIN, TIBC, IRON, RETICCTPCT in the last 72 hours. Urine analysis:    Component Value Date/Time   COLORURINE YELLOW 07/31/2014 2130   APPEARANCEUR CLOUDY (A) 07/31/2014 2130   LABSPEC 1.034 (H) 07/31/2014 2130   PHURINE 5.0 07/31/2014 2130   GLUCOSEU >1000 (A) 07/31/2014 2130   HGBUR TRACE (A) 07/31/2014 2130   HGBUR trace-lysed 06/16/2010 0833   BILIRUBINUR n 05/22/2015 0927   KETONESUR NEGATIVE 07/31/2014 2130   PROTEINUR 3+ 05/22/2015 0927   PROTEINUR 100 (A) 07/31/2014 2130   UROBILINOGEN 0.2 05/22/2015 0927   UROBILINOGEN 0.2 07/31/2014 2130   NITRITE n 05/22/2015 0927   NITRITE NEGATIVE 07/31/2014 2130   LEUKOCYTESUR Negative 05/22/2015 0927   Sepsis Labs: @LABRCNTIP (procalcitonin:4,lacticidven:4)  ) Recent Results (from the past 240 hour(s))  Novel Coronavirus, NAA ([facility])     Status: Abnormal   Collection Time: 06/07/19 10:07 AM   Specimen: Nasopharyngeal(NP) swabs in vial transport medium   NASOPHARYNGE  TESTING  Result Value Ref Range Status   SARS-CoV-2, NAA Detected (A) Not Detected Final    Comment: This nucleic acid amplification test was developed and its performance characteristics determined by [employer]. Nucleic acid amplification tests include RT-PCR and TMA. This test has not been FDA cleared or approved. This test has been authorized by FDA under an Emergency Use Authorization (EUA). This test is only authorized for the duration of time the declaration that circumstances exist justifying the authorization of the emergency  use of in vitro diagnostic tests for detection of SARS-CoV-2 virus and/or diagnosis of COVID-19 infection under section 564(b)(1) of the Act, 21 U.S.C. 932IZT-2(W) (1), unless the authorization is terminated or revoked sooner. When diagnostic testing is negative, the possibility of a false  negative result should be considered in the context of a patient's recent exposures and the presence of clinical signs and symptoms consistent with COVID-19. An individual without symptoms of COVID-19 and who is not shedding SARS-CoV-2 virus wo uld expect to have a negative (not detected) result in this assay.   Blood Culture (routine x 2)     Status: None   Collection Time: 06/10/19  7:16 AM   Specimen: BLOOD LEFT HAND  Result Value Ref Range Status   Specimen Description BLOOD LEFT HAND  Final   Special Requests   Final    BOTTLES DRAWN AEROBIC AND ANAEROBIC Blood Culture results may not be optimal due to an inadequate volume of blood received in culture bottles   Culture   Final    NO GROWTH 5 DAYS Performed at [facility], [place] [address] [place], [place] [address]    Report Status 06/15/2019 FINAL  Final  Blood Culture (routine x 2)     Status: None   Collection Time: 06/10/19  8:16 AM   Specimen: BLOOD  Result Value Ref Range Status   Specimen Description BLOOD LEFT ANTECUBITAL  Final   Special Requests   Final    BOTTLES DRAWN AEROBIC AND ANAEROBIC Blood Culture results may not be optimal due to an excessive volume of blood received in culture bottles   Culture   Final    NO GROWTH 5 DAYS Performed at [facility], [place] [address] [place], [place] [address]    Report Status 06/15/2019 FINAL  Final  Culture, body fluid-bottle     Status: Abnormal   Collection Time: 06/10/19  8:26 PM   Specimen: Peritoneal Dialysate  Result Value Ref Range Status   Specimen Description PERITONEAL DIALYSATE  Final   Special Requests   Final    BOTTLES DRAWN AEROBIC AND ANAEROBIC Blood  Culture adequate volume   Gram Stain   Final    GRAM POSITIVE COCCI IN BOTH AEROBIC AND ANAEROBIC BOTTLES CRITICAL RESULT CALLED TO, READ BACK BY AND VERIFIED WITH: S. MAFAT, RN AT 2120 ON 06/11/19 BY C. JESSUP, MT. Performed at [facility], [place] [address] [place], [place] [address]    Culture STAPHYLOCOCCUS HOMINIS (A)  Final   Report Status 06/13/2019 FINAL  Final   Organism ID, Bacteria STAPHYLOCOCCUS HOMINIS  Final      Susceptibility   Staphylococcus hominis - MIC*    CIPROFLOXACIN <=0.5 SENSITIVE Sensitive     ERYTHROMYCIN <=0.25 SENSITIVE Sensitive     GENTAMICIN <=0.5 SENSITIVE Sensitive     OXACILLIN RESISTANT Resistant     TETRACYCLINE 2 SENSITIVE Sensitive     VANCOMYCIN 1 SENSITIVE Sensitive     TRIMETH/SULFA <=10 SENSITIVE Sensitive     CLINDAMYCIN <=0.25 SENSITIVE Sensitive     RIFAMPIN <=0.5 SENSITIVE Sensitive     Inducible Clindamycin NEGATIVE Sensitive     * STAPHYLOCOCCUS HOMINIS  Gram stain     Status: None   Collection Time: 06/10/19  8:26 PM   Specimen: Peritoneal Dialysate  Result Value Ref Range Status   Specimen Description PERITONEAL DIALYSATE FLUID  Final   Special Requests NONE  Final   Gram Stain   Final    NO WBC SEEN NO ORGANISMS SEEN Performed at [facility], [address], [place],  [address]    Report Status 06/11/2019 FINAL  Final  C difficile quick scan w PCR reflex     Status: None   Collection Time:  06/10/19  8:40 PM   Specimen: STOOL  Result Value Ref Range Status   C Diff antigen NEGATIVE NEGATIVE Final   C Diff toxin NEGATIVE NEGATIVE Final   C Diff interpretation No C. difficile detected.  Final    Comment: Performed at [facility], [place] [address], [place], [place] [address]  GI pathogen panel by PCR, stool     Status: None   Collection Time: 06/10/19  8:40 PM   Specimen: Stool  Result Value Ref Range Status   Plesiomonas shigelloides NOT DETECTED NOT DETECTED Final   Yersinia enterocolitica NOT  DETECTED NOT DETECTED Final   Vibrio NOT DETECTED NOT DETECTED Final   Enteropathogenic E coli NOT DETECTED NOT DETECTED Final   E coli (ETEC) LT/ST NOT DETECTED NOT DETECTED Final   E coli 5409 by PCR Not applicable NOT DETECTED Final   Cryptosporidium by PCR NOT DETECTED NOT DETECTED Final   Entamoeba histolytica NOT DETECTED NOT DETECTED Final   Adenovirus F 40/41 NOT DETECTED NOT DETECTED Final   Norovirus GI/GII NOT DETECTED NOT DETECTED Final   Sapovirus NOT DETECTED NOT DETECTED Final    Comment: (NOTE) Performed At: [facility] [place], [place] [id number] Rush Farmer MD NF:[id number]    Vibrio cholerae NOT DETECTED NOT DETECTED Final   Campylobacter by PCR NOT DETECTED NOT DETECTED Final   Salmonella by PCR NOT DETECTED NOT DETECTED Final   E coli (STEC) NOT DETECTED NOT DETECTED Final   Enteroaggregative E coli NOT DETECTED NOT DETECTED Final   Shigella by PCR NOT DETECTED NOT DETECTED Final   Cyclospora cayetanensis NOT DETECTED NOT DETECTED Final   Astrovirus NOT DETECTED NOT DETECTED Final   G lamblia by PCR NOT DETECTED NOT DETECTED Final   Rotavirus A by PCR NOT DETECTED NOT DETECTED Final  Blood culture (routine x 2)     Status: None (Preliminary result)   Collection Time: 06/14/19  8:40 AM   Specimen: BLOOD  Result Value Ref Range Status   Specimen Description BLOOD LEFT ANTECUBITAL  Final   Special Requests   Final    BOTTLES DRAWN AEROBIC AND ANAEROBIC Blood Culture adequate volume   Culture   Final    NO GROWTH 1 DAY Performed at [facility], [address], [place], [place] [address]    Report Status PENDING  Incomplete  Blood culture (routine x 2)     Status: None (Preliminary result)   Collection Time: 06/14/19  8:54 AM   Specimen: BLOOD LEFT HAND  Result Value Ref Range Status   Specimen Description BLOOD LEFT HAND  Final   Special Requests   Final    BOTTLES DRAWN AEROBIC ONLY Blood Culture results may not be optimal  due to an inadequate volume of blood received in culture bottles   Culture   Final    NO GROWTH 1 DAY Performed at [facility], [place] [address] [place], [place] [address]    Report Status PENDING  Incomplete      Studies: MR BRAIN WO CONTRAST  Result Date: 06/14/2019 CLINICAL DATA:  Seizure, COVID positive EXAM: MRI HEAD WITHOUT CONTRAST TECHNIQUE: Multiplanar, multiecho pulse sequences of the brain and surrounding structures were obtained without intravenous contrast. COMPARISON:  None. FINDINGS: Brain: There is no acute infarction or intracranial hemorrhage. There is no intracranial mass, mass effect, or edema. There is no hydrocephalus or extra-axial fluid collection. Patchy and confluent T2 hyperintensity in the supratentorial white matter is  nonspecific but may reflect mild to moderate chronic microvascular ischemic changes. Vascular: Major vessel flow voids at the skull base are preserved. Skull and upper cervical spine: Normal marrow signal is preserved. Sinuses/Orbits: Mild mucosal thickening.  Orbits are unremarkable. Other: Sella is unremarkable.  Mastoid air cells are clear. IMPRESSION: No intracranial mass.  Chronic microvascular ischemic changes. Electronically Signed   By: Macy Mis M.D.   On: 06/14/2019 16:05   EEG adult  Result Date: 06/15/2019 Lora Havens, MD     06/15/2019  9:54 AM Patient Name: [name] [name] MRN: [id number] Epilepsy Attending: Lora Havens Referring Physician/Provider: Dr Fuller Plan Date: 06/14/2019 Duration: 22.37 mins Patient history: 68yo M, COVID+ was brought in after a seizure like episode. Yesterday morning his wife notes that she heard a noise and found him in the bathroom.  She reports that his hips were shaking, he was foaming at the mouth as if he was trying to throw up or cough, and his eyes were wide open.  Thereafter, he stopped moving and did not appear to be breathing for a few seconds before responding to his wife saying  his name. EEG to evaluate for seizure. Level of alertness: awake AEDs during EEG study: None Technical aspects: This EEG study was done with scalp electrodes positioned according to the 10-20 International system of electrode placement. Electrical activity was acquired at a sampling rate of 500Hz  and reviewed with a high frequency filter of 70Hz  and a low frequency filter of 1Hz . EEG data were recorded continuously and digitally stored. DESCRIPTION: The posterior dominant rhythm consists of 7Hz  activity of moderate voltage (25-35 uV) seen predominantly in posterior head regions, symmetric and reactive to eye opening and eye closing. EEG also showed continuous generalized 3-6hz  theta-delta slowing. Hyperventilation and photic stimulation were not performed. ABNORMALITY - Continuous slow, generalized - Background slow IMPRESSION: This study is suggestive of mild diffuse encephalopathy, non specific to etiology. No seizures or epileptiform discharges were seen throughout the recording. Priyanka Barbra Sarks    Scheduled Meds: . calcitRIOL  0.5 mcg Oral Daily  . dexamethasone  6 mg Oral Daily  . gentamicin cream  1 application Topical Daily  . insulin aspart  0-5 Units Subcutaneous QHS  . insulin aspart  0-6 Units Subcutaneous TID WC  . [START ON 06/16/2019] pantoprazole  40 mg Oral BID  . pantoprazole (PROTONIX) IV  40 mg Intravenous Q12H  . sevelamer carbonate  2,400 mg Oral TID WC  . sodium chloride flush  3 mL Intravenous Q12H    Continuous Infusions: . dialysis solution 1.5% low-MG/low-CA       LOS: 0 days     Kayleen Memos, MD [facility] Pager [phone]  If 7PM-7AM, please contact night-coverage [email or web address] Password [facility] 06/15/2019, 11:14 AM

## 2019-06-15 NOTE — Progress Notes (Signed)
Daily Rounding Note  06/15/2019, 10:57 AM  LOS: 0 days   SUBJECTIVE:   Chief complaint:   Intermittent CGE. No further CGE reported.  Patient tolerating clears.  OBJECTIVE:         Vital signs in last 24 hours:    Temp:  [97.5 F (36.4 C)-98.7 F (37.1 C)] 98.5 F (36.9 C) (01/28 0800) Pulse Rate:  [62-85] 77 (01/28 0800) Resp:  [9-20] 13 (01/28 0800) BP: (104-149)/(52-87) 135/54 (01/28 0800) SpO2:  [94 %-100 %] 94 % (01/28 0800) Weight:  [72.2 kg] 72.2 kg (01/27 1850) Last BM Date: 06/10/19 Filed Weights   06/14/19 1850  Weight: 72.2 kg   Patient not reexamined.  Discussed his case with the nurse.  Intake/Output from previous day: 01/27 0701 - 01/28 0700 In: 409.9 [P.O.:200; I.V.:209.9] Out: 0   Intake/Output this shift: Total I/O In: 480 [P.O.:480] Out: -   Lab Results: Recent Labs    06/14/19 0822 06/14/19 1655 06/15/19 0332  WBC 7.8  --  7.5  HGB 11.0* 10.9* 10.1*  HCT 32.4* 32.0* 29.3*  PLT 281  --  236   BMET Recent Labs    06/14/19 0822 06/15/19 0332  NA 134* 137  K 3.2* 3.0*  CL 92* 94*  CO2 22 23  GLUCOSE 272* 216*  BUN 80* 67*  CREATININE 13.35* 11.58*  CALCIUM 8.2* 7.7*   LFT Recent Labs    06/14/19 0822 06/15/19 0332  PROT 6.6  --   ALBUMIN 2.8* 2.2*  AST 37  --   ALT 43  --   ALKPHOS 40  --   BILITOT 0.3  --    PT/INR Recent Labs    06/14/19 0905  LABPROT 14.2  INR 1.1   Hepatitis Panel No results for input(s): HEPBSAG, HCVAB, HEPAIGM, HEPBIGM in the last 72 hours.  Studies/Results: MR BRAIN WO CONTRAST  Result Date: 06/14/2019 CLINICAL DATA:  Seizure, COVID positive EXAM: MRI HEAD WITHOUT CONTRAST TECHNIQUE: Multiplanar, multiecho pulse sequences of the brain and surrounding structures were obtained without intravenous contrast. COMPARISON:  None. FINDINGS: Brain: There is no acute infarction or intracranial hemorrhage. There is no intracranial mass, mass  effect, or edema. There is no hydrocephalus or extra-axial fluid collection. Patchy and confluent T2 hyperintensity in the supratentorial white matter is nonspecific but may reflect mild to moderate chronic microvascular ischemic changes. Vascular: Major vessel flow voids at the skull base are preserved. Skull and upper cervical spine: Normal marrow signal is preserved. Sinuses/Orbits: Mild mucosal thickening.  Orbits are unremarkable. Other: Sella is unremarkable.  Mastoid air cells are clear. IMPRESSION: No intracranial mass.  Chronic microvascular ischemic changes. Electronically Signed   By: Macy Mis M.D.   On: 06/14/2019 16:05   DG Chest Port 1 View  Result Date: 06/14/2019 CLINICAL DATA:  Shortness of breath.  COVID-19 positive EXAM: PORTABLE CHEST 1 VIEW COMPARISON:  June 10, 2019 FINDINGS: Lungs are clear. Heart size and pulmonary vascularity are normal. No adenopathy. There is mild degenerative change in the thoracic spine. IMPRESSION: Lungs clear.  Stable cardiac silhouette.  No adenopathy. Electronically Signed   By: Lowella Grip III M.D.   On: 06/14/2019 10:14   EEG adult  Result Date: 06/15/2019 Lora Havens, MD     06/15/2019  9:54 AM Patient Name: [name] [name] MRN: [id number] Epilepsy Attending: Lora Havens Referring Physician/Provider: Dr Fuller Plan Date: 06/14/2019 Duration: 22.37 mins Patient history: 68yo M, COVID+ was  brought in after a seizure like episode. Yesterday morning his wife notes that she heard a noise and found him in the bathroom.  She reports that his hips were shaking, he was foaming at the mouth as if he was trying to throw up or cough, and his eyes were wide open.  Thereafter, he stopped moving and did not appear to be breathing for a few seconds before responding to his wife saying his name. EEG to evaluate for seizure. Level of alertness: awake AEDs during EEG study: None Technical aspects: This EEG study was done with scalp electrodes  positioned according to the 10-20 International system of electrode placement. Electrical activity was acquired at a sampling rate of 500Hz  and reviewed with a high frequency filter of 70Hz  and a low frequency filter of 1Hz . EEG data were recorded continuously and digitally stored. DESCRIPTION: The posterior dominant rhythm consists of 7Hz  activity of moderate voltage (25-35 uV) seen predominantly in posterior head regions, symmetric and reactive to eye opening and eye closing. EEG also showed continuous generalized 3-6hz  theta-delta slowing. Hyperventilation and photic stimulation were not performed. ABNORMALITY - Continuous slow, generalized - Background slow IMPRESSION: This study is suggestive of mild diffuse encephalopathy, non specific to etiology. No seizures or epileptiform discharges were seen throughout the recording. Priyanka Barbra Sarks    ASSESMENT:   *   CGE, intermittent. Day 2 IV Protonix, bid  *    Mild, anemia, chronic.  Hb down less than 1 g from baseline.  *    Covid infection hospitalized at [facility] 1/23 -1/25.  Completed remdesivir on 1/27.  Continues on Decadron. No active respiratory issues.  *    ESRD.  On home peritoneal dialysis.  *    Syncope.  Had MR negative for acute stroke, shows chronic microvascular disease.   PLAN   *    Advance to full liquid diet.  Could advance to renal diet tomorrow morning if all goes well. Leave IV Protonix in place for today but begin oral, twice daily Protonix tomorrow.  Continue this for 10 days and then drop dose to once daily.  Was not on H2 blockers or PPI PTA.  *    GI will sign off.  Call back if patient has recurrent significant vomiting or hematemesis.    Azucena Freed  06/15/2019, 10:57 AM Phone [phone]

## 2019-06-15 NOTE — Procedures (Signed)
Patient Name: [name] [name]  MRN: [id number]  Epilepsy Attending: Lora Havens  Referring Physician/Provider: Dr Fuller Plan Date: 06/14/2019 Duration: 22.37 mins  Patient history: 68yo M, COVID+ was brought in after a seizure like episode. Yesterday morning his wife notes that she heard a noise and found him in the bathroom.  She reports that his hips were shaking, he was foaming at the mouth as if he was trying to throw up or cough, and his eyes were wide open.  Thereafter, he stopped moving and did not appear to be breathing for a few seconds before responding to his wife saying his name. EEG to evaluate for seizure.  Level of alertness: awake  AEDs during EEG study: None  Technical aspects: This EEG study was done with scalp electrodes positioned according to the 10-20 International system of electrode placement. Electrical activity was acquired at a sampling rate of 500Hz  and reviewed with a high frequency filter of 70Hz  and a low frequency filter of 1Hz . EEG data were recorded continuously and digitally stored.   DESCRIPTION: The posterior dominant rhythm consists of 7Hz  activity of moderate voltage (25-35 uV) seen predominantly in posterior head regions, symmetric and reactive to eye opening and eye closing. EEG also showed continuous generalized 3-6hz  theta-delta slowing. Hyperventilation and photic stimulation were not performed.  ABNORMALITY - Continuous slow, generalized - Background slow  IMPRESSION: This study is suggestive of mild diffuse encephalopathy, non specific to etiology. No seizures or epileptiform discharges were seen throughout the recording.  Natasa Stigall Barbra Sarks

## 2019-06-15 NOTE — Progress Notes (Signed)
Updated the patient's wife Ms. Phenonda via phone.  All questions answered.

## 2019-06-15 NOTE — Plan of Care (Signed)
Patient stable throughout shift. Remains on RA. Up with SBA. Call light in reach.   Problem: Education: Goal: Knowledge of General Education information will improve Description: Including pain rating scale, medication(s)/side effects and non-pharmacologic comfort measures Outcome: Progressing   Problem: Health Behavior/Discharge Planning: Goal: Ability to manage health-related needs will improve Outcome: Progressing   Problem: Clinical Measurements: Goal: Ability to maintain clinical measurements within normal limits will improve Outcome: Progressing Goal: Will remain free from infection Outcome: Progressing Goal: Diagnostic test results will improve Outcome: Progressing Goal: Respiratory complications will improve Outcome: Progressing Goal: Cardiovascular complication will be avoided Outcome: Progressing   Problem: Activity: Goal: Risk for activity intolerance will decrease Outcome: Progressing   Problem: Nutrition: Goal: Adequate nutrition will be maintained Outcome: Progressing   Problem: Coping: Goal: Level of anxiety will decrease Outcome: Progressing   Problem: Elimination: Goal: Will not experience complications related to bowel motility Outcome: Progressing Goal: Will not experience complications related to urinary retention Outcome: Progressing   Problem: Pain Managment: Goal: General experience of comfort will improve Outcome: Progressing   Problem: Safety: Goal: Ability to remain free from injury will improve Outcome: Progressing   Problem: Skin Integrity: Goal: Risk for impaired skin integrity will decrease Outcome: Progressing

## 2019-06-16 ENCOUNTER — Encounter (HOSPITAL_COMMUNITY): Payer: Self-pay

## 2019-06-16 ENCOUNTER — Other Ambulatory Visit: Payer: Self-pay

## 2019-06-16 LAB — CBC
HCT: 31.1 % — ABNORMAL LOW (ref 39.0–52.0)
Hemoglobin: 10.5 g/dL — ABNORMAL LOW (ref 13.0–17.0)
MCH: 29.2 pg (ref 26.0–34.0)
MCHC: 33.8 g/dL (ref 30.0–36.0)
MCV: 86.6 fL (ref 80.0–100.0)
Platelets: 292 10*3/uL (ref 150–400)
RBC: 3.59 MIL/uL — ABNORMAL LOW (ref 4.22–5.81)
RDW: 12.6 % (ref 11.5–15.5)
WBC: 12.6 10*3/uL — ABNORMAL HIGH (ref 4.0–10.5)
nRBC: 0 % (ref 0.0–0.2)

## 2019-06-16 LAB — BASIC METABOLIC PANEL
Anion gap: 14 (ref 5–15)
BUN: 53 mg/dL — ABNORMAL HIGH (ref 8–23)
CO2: 25 mmol/L (ref 22–32)
Calcium: 8 mg/dL — ABNORMAL LOW (ref 8.9–10.3)
Chloride: 96 mmol/L — ABNORMAL LOW (ref 98–111)
Creatinine, Ser: 10.57 mg/dL — ABNORMAL HIGH (ref 0.61–1.24)
GFR calc Af Amer: 5 mL/min — ABNORMAL LOW (ref >60)
GFR calc non Af Amer: 4 mL/min — ABNORMAL LOW (ref >60)
Glucose, Bld: 205 mg/dL — ABNORMAL HIGH (ref 70–99)
Potassium: 3.3 mmol/L — ABNORMAL LOW (ref 3.5–5.1)
Sodium: 135 mmol/L (ref 135–145)

## 2019-06-16 LAB — PROCALCITONIN: Procalcitonin: 0.91 ng/mL

## 2019-06-16 LAB — MAGNESIUM: Magnesium: 1.8 mg/dL (ref 1.7–2.4)

## 2019-06-16 LAB — GLUCOSE, CAPILLARY
Glucose-Capillary: 191 mg/dL — ABNORMAL HIGH (ref 70–99)
Glucose-Capillary: 96 mg/dL (ref 70–99)

## 2019-06-16 MED ORDER — GENTAMICIN SULFATE 0.1 % EX CREA: 1.0000 "application " | TOPICAL_CREAM | Freq: Every day | CUTANEOUS | 0 refills | Status: DC

## 2019-06-16 MED ORDER — PANTOPRAZOLE SODIUM 40 MG PO TBEC: 40.0000 mg | DELAYED_RELEASE_TABLET | Freq: Two times a day (BID) | ORAL | 0 refills | Status: DC

## 2019-06-16 MED ORDER — PANTOPRAZOLE SODIUM 40 MG PO TBEC: 40.0000 mg | DELAYED_RELEASE_TABLET | Freq: Every day | ORAL | 1 refills | Status: DC

## 2019-06-16 MED ORDER — DEXAMETHASONE 6 MG PO TABS: 6.0000 mg | ORAL_TABLET | Freq: Every day | ORAL | 0 refills | Status: AC

## 2019-06-16 NOTE — Progress Notes (Signed)
[facility] Progress Note   Subjective:   ESRD patient on PD presented to the ED due to syncope and n/v/d. Syncopal event witnessed by EMS this AM as well as coffee ground emesis.    Recent admission from 1/23-1/25/21 due to COVID 19 infection, hypoxia and weakness with falls.  Discharged home once stable and weaned of O2 with Po decadron and Remdesivir infusion scheduled as outpatient.  Pertinent findings in the ED include +FOBT, Hgb 11.0, K 3.2, and CXR with no acute findings.  Patient admitted to observation for further evaluation and management.   No new issues today, may be going home.    Patient not examined today directly given COVID-19 + status, utilizing data taken from chart +/- discussions w/ providers and staff.     Objective Vitals:   06/15/19 1230 06/15/19 1651 06/15/19 2119 06/16/19 0515  BP: 127/61 (!) 149/58 (!) 159/72 112/67  Pulse:  80 84 73  Resp:  14 15 17   Temp: 98.5 F (36.9 C) 98 F (36.7 C) 98 F (36.7 C) 98.2 F (36.8 C)  TempSrc: Oral Oral Oral Oral  SpO2:  96% 100% 96%  Weight:      Height:       Physical Exam  Patient not examined today directly given COVID-19 + status, utilizing data taken from chart +/- discussions w/ providers and staff.     Additional Objective Labs: Basic Metabolic Panel: Recent Labs  Lab 06/11/19 0239 06/11/19 0239 06/12/19 0605 06/12/19 0605 06/14/19 0822 06/15/19 0332 06/16/19 0848  NA 134*   < > 134*   < > 134* 137 135  K 4.4   < > 4.1   < > 3.2* 3.0* 3.3*  CL 97*   < > 94*   < > 92* 94* 96*  CO2 23   < > 22   < > 22 23 25   GLUCOSE 279*   < > 241*   < > 272* 216* 205*  BUN 54*   < > 66*   < > 80* 67* 53*  CREATININE 12.01*   < > 12.84*   < > 13.35* 11.58* 10.57*  CALCIUM 8.2*   < > 8.4*   < > 8.2* 7.7* 8.0*  PHOS 6.4*  --  7.7*  --   --  6.4*  --    < > = values in this interval not displayed.   Liver Function Tests: Recent Labs  Lab 06/11/19 0239 06/11/19 0239 06/12/19 0605  06/14/19 0822 06/15/19 0332  AST 18  --  17 37  --   ALT 14  --  17 43  --   ALKPHOS 37*  --  39 40  --   BILITOT 0.3  --  0.8 0.3  --   PROT 6.4*  --  6.5 6.6  --   ALBUMIN 2.2*   < > 2.5* 2.8* 2.2*   < > = values in this interval not displayed.   Recent Labs  Lab 06/14/19 0822  LIPASE 57*   CBC: Recent Labs  Lab 06/11/19 0239 06/11/19 0239 06/12/19 0605 06/12/19 0605 06/14/19 0822 06/14/19 0822 06/14/19 1655 06/15/19 0332 06/16/19 0848  WBC 5.2   < > 8.5   < > 7.8  --   --  7.5 12.6*  NEUTROABS 4.1  --  6.9  --  6.2  --   --   --   --   HGB 10.7*   < > 10.9*   < > 11.0*   < >  10.9* 10.1* 10.5*  HCT 32.7*   < > 32.0*   < > 32.4*   < > 32.0* 29.3* 31.1*  MCV 89.1  --  85.3  --  88.5  --   --  85.2 86.6  PLT 244   < > 280   < > 281  --   --  236 292   < > = values in this interval not displayed.   Blood Culture    Component Value Date/Time   SDES BLOOD LEFT HAND 06/14/2019 0854   SPECREQUEST  06/14/2019 0854    BOTTLES DRAWN AEROBIC ONLY Blood Culture results may not be optimal due to an inadequate volume of blood received in culture bottles   CULT  06/14/2019 0854    NO GROWTH 2 DAYS Performed at [facility], [place] [address], [place], [place] [address]    REPTSTATUS PENDING 06/14/2019 2979    CBG: Recent Labs  Lab 06/15/19 0803 06/15/19 1151 06/15/19 1744 06/15/19 2119 06/16/19 0806  GLUCAP 169* 118* 192* 227* 191*    Medications: . dialysis solution 1.5% low-MG/low-CA Stopped (06/16/19 0747)   . calcitRIOL  0.5 mcg Oral Daily  . dexamethasone  6 mg Oral Daily  . gentamicin cream  1 application Topical Daily  . insulin aspart  0-5 Units Subcutaneous QHS  . insulin aspart  0-6 Units Subcutaneous TID WC  . pantoprazole  40 mg Oral BID  . sevelamer carbonate  2,400 mg Oral TID WC  . sodium chloride flush  3 mL Intravenous Q12H    Home meds:  - amlodipine 5/ telmisartan 40 / torsemide 100 qd  - aspirin 81  - sevelamer carb ac tid  - prn's/  vitamins/ supplements  Dialysis Orders: CCPD, 5 exchanges, 3064mL fill volume, 1hr 62min fill time, no day dwell, 70kg  Assessment/Plan: Syncope vs seizure - Cont 1.5% for PD fluids. No further episodes. EEG and MRI no acute. No further issues UGIB/ hematemesis - coffee ground emesis reported.  +FOBT. Hgb at goal 11. Seen by GI, recommending IV PPI and then po bid PPI while on decadron, then qd x 8wks thereafter.  ESRD - on PD.  Will write orders for PD nightly while admitted. Low K, ordered 30 bid today kdur +Staph hominis PD fluid cx - from  last admit 1/24 several days ago.  Cell ct then was up 175.  Pt has been without any abd pain or cloudy fluid while here and I agree w Dr Joelyn Oms from last admit this is likely contaminant.  We will follow closely at OP PD.   Anemia of CKD- Hgb in goal at 11.0. No indication for ESA at this time Secondary hyperparathyroidism - Ca at goal.  Will check phos. Continue binders and VDRA HTN/volume - Blood pressure well controlled, not getting home meds (ARB, norvasc) Nutrition - Renal diet w/fluid restrictions.  Dispo - possible dc today   Kelly Splinter  MD 06/16/2019, 10:39 AM

## 2019-06-16 NOTE — Plan of Care (Signed)

## 2019-06-16 NOTE — Care Management (Signed)
Pt deemed stable for discharge home.  CM reviewed pts chart for TOC needs - none determined.  Discharge orders written - no outstanding TOC consults/orders.  CM signing off

## 2019-06-16 NOTE — Evaluation (Addendum)
Physical Therapy Evaluation Patient Details Name: [name] [name] MRN: [id number] DOB: 09-Aug-1950 Today's Date: 06/16/2019   History of Present Illness  [name] [name] is a 69 y.o. male with medical history significant of hypertension, ESRD on PD, DM type II with peripheral neuropathy, arthritis, anemia chronic disease, remote history of tobacco use, COVID-19 positive on 1/20, and functional diarrhea.  Admitted due to vomiting,  hypotension, question seizure and LOC witnessed by wife.  Clinical Impression  Patient presents with mobility close to his functional baseline.  He reports no to minimal symptoms with ambulation despite noted orthostatic hypotension prior to ambulation.  Educated on safety precautions to avoid falls at home.  Has needed DME and family support at home.  Feel no follow up PT needs at this time, encouraged hallway ambulation if not d/c.  No further skilled PT needs. Orthostatic VS for the past 24 hrs (Last 3 readings):  BP- Sitting Pulse- Sitting BP- Standing at 0 minutes Pulse- Standing at 0 minutes BP- Standing at 3 minutes Pulse- Standing at 3 minutes  06/16/19 0900 117/62 81 95/61 94 (!) 89/59 93      Follow Up Recommendations No PT follow up;Supervision - Intermittent    Equipment Recommendations  None recommended by PT    Recommendations for Other Services       Precautions / Restrictions Precautions Precautions: Fall Precaution Comments: watch BP/syncope      Mobility  Bed Mobility               General bed mobility comments: seated on BSC  Transfers Overall transfer level: Modified independent Equipment used: None Transfers: Sit to/from Stand Sit to Stand: Modified independent (Device/Increase time)            Ambulation/Gait Ambulation/Gait assistance: Supervision;Min guard Gait Distance (Feet): 250 Feet Assistive device: None Gait Pattern/deviations: Step-through pattern;Decreased stride length     General Gait Details: no  LOB with ambulation, minguard at times due to prior hypotension noted  Stairs            Wheelchair Mobility    Modified Rankin (Stroke Patients Only)       Balance Overall balance assessment: Mild deficits observed, not formally tested                                           Pertinent Vitals/Pain Pain Assessment: No/denies pain    Home Living Family/patient expects to be discharged to:: Private residence Living Arrangements: Spouse/significant other;Children Available Help at Discharge: Family;Available 24 hours/day Type of Home: House Home Access: Stairs to enter Entrance Stairs-Rails: Psychiatric nurse of Steps: 2 Home Layout: One level Home Equipment: Clinical cytogeneticist - 2 wheels      Prior Function Level of Independence: Independent         Comments: drives, 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     Journalist, newspaper   Dominant Hand: Right    Extremity/Trunk Assessment   Upper Extremity Assessment Upper Extremity Assessment: Overall WFL for tasks assessed    Lower Extremity Assessment Lower Extremity Assessment: Overall WFL for tasks assessed       Communication   Communication: No difficulties  Cognition Arousal/Alertness: Awake/alert Behavior During Therapy: WFL for tasks assessed/performed Overall Cognitive Status: Within Functional Limits for tasks assessed  General Comments General comments (skin integrity, edema, etc.): educated on fall prevention, use of walker for nightime and first in morning trip to bathroom due to history of syncope and sitting then standing several seconds prior to moving    Exercises     Assessment/Plan    PT Assessment Patent does not need any further PT services  PT Problem List         PT Treatment Interventions      PT Goals (Current goals can be found in the Care Plan section)  Acute Rehab PT Goals PT Goal  Formulation: All assessment and education complete, DC therapy    Frequency     Barriers to discharge        Co-evaluation               AM-PAC PT "6 Clicks" Mobility  Outcome Measure Help needed turning from your back to your side while in a flat bed without using bedrails?: None Help needed moving from lying on your back to sitting on the side of a flat bed without using bedrails?: None Help needed moving to and from a bed to a chair (including a wheelchair)?: None Help needed standing up from a chair using your arms (e.g., wheelchair or bedside chair)?: None Help needed to walk in hospital room?: A Little Help needed climbing 3-5 steps with a railing? : A Little 6 Click Score: 22    End of Session   Activity Tolerance: Patient tolerated treatment well Patient left: in bed;with call bell/phone within reach(seated EOB)   PT Visit Diagnosis: Muscle weakness (generalized) (M62.81)    Time: 6962-9528 PT Time Calculation (min) (ACUTE ONLY): 29 min   Charges:   PT Evaluation $PT Eval Low Complexity: 1 Low PT Treatments $Gait Training: 8-22 mins        Magda Kiel, [place] [phone] 06/16/2019   Reginia Naas 06/16/2019, 1:38 PM

## 2019-06-16 NOTE — Discharge Summary (Addendum)
Discharge Summary  [name] [name] LFY:[id number] DOB: 1951-05-01  PCP: [name], [name], MD  Admit date: 06/14/2019 Discharge date: 06/16/2019  Time spent: 35 minutes   Recommendations for Outpatient Follow-up:  1. Follow up with GI. 2. Follow up with your PCP in 1-2 weeks. 3. Take your medications as prescribed.  4. Continue home hemodialysis 5. Quarantine for 21 days from 06/07/19.  End quarantine day 06/28/19.  Discharge Diagnoses:  Active Hospital Problems   Diagnosis Date Noted  . Hematemesis 06/14/2019  . Anemia of chronic disease 06/14/2019  . Syncope 06/10/2019  . COVID-19 virus infection 06/10/2019  . ESRD on peritoneal dialysis ([place]) 02/12/2017  . Diabetes mellitus type 2, controlled ([place]) 06/21/2015  . Essential hypertension 09/10/2014    Resolved Hospital Problems  No resolved problems to display.    Discharge Condition: Stable   Diet recommendation: Renal dialysis diet   Vitals:   06/15/19 2119 06/16/19 0515  BP: (!) 159/72 112/67  Pulse: 84 73  Resp: 15 17  Temp: 98 F (36.7 C) 98.2 F (36.8 C)  SpO2: 100% 96%    History of present illness:  [name] Dacanay McDowellis a 69 y.o.malewith medical history significant ofhypertension, ESRD on home HD, diet controlled DM type II with peripheral neuropathy, arthritis, anemia of chronic disease, remote history of tobacco use,COVID-19 positive on 06/07/19,and functional diarrhea. Presented due to seizure like movement at home. Associated with decrease oral intake and diarrhea.  Recently hospitalized from 1/23-1/25 for acute hypoxic respiratory failure secondary to covid-19 viral pneumonia.  Planned to receive last infusion dose of remdesivir on day of presentation but instead came to the ED for further evaluation.  Coffee ground emesis also reported by EMS for which GI [facility] was consulted.  No recurrence while hospitalized.  GI recommended medical management with IV PPI BID initially then po BID while  on Decadron then once a day indefinitely.  Signed off.  Due to seizure like movement at home, EEG was done, did not show seizure activity.   06/16/19: Seen and examined.  No acute events overnight.  States he feels well.  He denies any abdominal pain or nausea.  No cardiopulmonary symptoms.  Okay to discharge from renal standpoint.  On the day of discharge the patient was hemodynamically stable.  He will need to follow-up with GI and his PCP posthospitalization.  Patient understands and agrees to plan.    Hospital Course:  Principal Problem:   Hematemesis Active Problems:   Essential hypertension   Diabetes mellitus type 2, controlled ([place])   ESRD on peritoneal dialysis ([place])   COVID-19 virus infection   Syncope   Anemia of chronic disease  Hematemesis, unclear etiology with positive stool guaiac Presented with nausea and vomiting.  Coffee ground emesis also reported by EMS for which GI [facility] was consulted.   Recommended IV Protonix 40 mg twice daily and to advance diet as tolerated. Then oral Protonix 40 mg twice daily x10 days or when on Decadron then once daily.  GI signed off.    Seizure versus syncope:  -Wife reports that it appeared that the patient was having a seizure and was foaming at the mouth, differential includes convulsive syncope. -Positive orthostatic vital signs -MRI brain no acute intracranial findings but shows evidence of chronic microvascular disease. -EEG no evidence of seizures, but evidence of mild diffuse encephalopathy. -Continue seizure precautions  Presumed Upper GI bleed/coffee-ground emesis Management as stated above Hg stable and trending up to 10.5 from 10.1 Continue PPI as  recommended by GI.  Acute blood loss anemia secondary to upper GI bleed Hg stable Follow up with GI and PCP  Orthostatic hypotension Avoid dehydration  COVID-19 viral infection/pneumonia: Diagnosed with COVID-19 on 06/07/19. Completed course of  remdesivir. Continue po Decadron 6 mg daily for 7 days. Quarantine for 21 days from 06/07/19.  End quarantine day 06/28/19.  ESRD on peritoneal dialysis:  -Nephrology following.  Refractory Hypokalemia Potassium 3.0>>3.3 Follow up with nephrology/PCP  Anemia of chronic disease secondary to ESRD: \ Stable  Essential hypertension:  Blood pressure stable Home antihypertensives on hold due to soft Bps. Follow up with your PCP   Diet controlled Diabetes mellitus type 2, with hyperglycemia:  Hyperglycemia likely 2/2 steroid use Last hemoglobin A1c noted to be 6.2 (04/19/2019) Hold off home insulin to avoid hypoglycemia.  Likely will improve once Decadron is stopped. Follow up with your PCP in 1-2 weeks   Code Status:Full    Consults called:nephrology, GI   Discharge Exam: BP 112/67 (BP Location: Right Arm)   Pulse 73   Temp 98.2 F (36.8 C) (Oral)   Resp 17   Ht 5\' 10"  (1.778 m)   Wt 72.2 kg   SpO2 96%   BMI 22.84 kg/m  . General: 69 y.o. year-old male well developed well nourished in no acute distress.  Alert and oriented x3. . Cardiovascular: Regular rate and rhythm with no rubs or gallops.  No thyromegaly or JVD noted.   Marland Kitchen Respiratory: Clear to auscultation with no wheezes or rales. Good inspiratory effort. . Abdomen: Soft nontender nondistended with normal bowel sounds x4 quadrants. . Musculoskeletal: No lower extremity edema. 2/4 pulses in all 4 extremities. Marland Kitchen Psychiatry: Mood is appropriate for condition and setting  Discharge Instructions You were cared for by a hospitalist during your hospital stay. If you have any questions about your discharge medications or the care you received while you were in the hospital after you are discharged, you can call the unit and asked to speak with the hospitalist on call if the hospitalist that took care of you is not available. Once you are discharged, your primary care physician will handle any further medical  issues. Please note that NO REFILLS for any discharge medications will be authorized once you are discharged, as it is imperative that you return to your primary care physician (or establish a relationship with a primary care physician if you do not have one) for your aftercare needs so that they can reassess your need for medications and monitor your lab values.   Allergies as of 06/16/2019      Reactions   Other Rash   Oxyquinoline-white Pet-lanolin   No Known Allergies    Bag Balm  [albolene] Rash      Medication List    STOP taking these medications   amLODipine 5 MG tablet Commonly known as: NORVASC   telmisartan 40 MG tablet Commonly known as: MICARDIS   torsemide 100 MG tablet Commonly known as: DEMADEX     TAKE these medications   albuterol 108 (90 Base) MCG/ACT inhaler Commonly known as: VENTOLIN HFA Inhale 2 puffs into the lungs every 6 (six) hours as needed for wheezing or shortness of breath.   aspirin 81 MG tablet Take 1 tablet (81 mg total) by mouth daily.   calcitRIOL 0.5 MCG capsule Commonly known as: ROCALTROL Take 0.5 mcg by mouth daily.   dexamethasone 6 MG tablet Commonly known as: DECADRON Take 1 tablet (6 mg total) by mouth daily for  7 days.   gentamicin cream 0.1 % Commonly known as: GARAMYCIN Apply 1 application topically daily. Start taking on: June 17, 2019   heparin flush 10 UNIT/ML Soln injection 50 Units See admin instructions. In dialysis bag   pantoprazole 40 MG tablet Commonly known as: PROTONIX Take 1 tablet (40 mg total) by mouth 2 (two) times daily for 7 days.   pantoprazole 40 MG tablet Commonly known as: Protonix Take 1 tablet (40 mg total) by mouth daily.   Rena-Vite Rx 1 MG Tabs Take 1 mg by mouth See admin instructions. Take 1mg  daily on dialysis days. Take after treatment.   sevelamer carbonate 800 MG tablet Commonly known as: RENVELA Take 1-3 tablets by mouth See admin instructions. Take 3 tablets by mouth three  times daily with meals and 1 tablet twice daily with snacks.   UNABLE TO FIND TAKE 1 TABLET BY MOUTH EVERY DAY (ON DIALYSIS DAYS, TAKE AFTER DIALYSIS TREATMENT)   Vitamin D3 25 MCG tablet Commonly known as: Vitamin D Take 1 tablet (1,000 Units total) by mouth daily.      Allergies  Allergen Reactions  . Other Rash    Oxyquinoline-white Pet-lanolin  . No Known Allergies   . Bag Balm  [Albolene] Rash   Follow-up Information    [name], [name], MD. Call in 1 day(s).   Specialty: Internal Medicine Why: Please call for a post hospital follow-up appointment. Contact information: [place] [address] [phone]        Thornton Park, MD. Call in 1 day(s).   Specialty: Gastroenterology Why: Please call for a post hospital follow-up appointment. Contact information: [place] [address] [phone]            The results of significant diagnostics from this hospitalization (including imaging, microbiology, ancillary and laboratory) are listed below for reference.    Significant Diagnostic Studies: CT HEAD WO CONTRAST  Result Date: 06/10/2019 CLINICAL DATA:  Ataxia, multiple falls EXAM: CT HEAD WITHOUT CONTRAST TECHNIQUE: Contiguous axial images were obtained from the base of the skull through the vertex without intravenous contrast. COMPARISON:  07/28/2012 FINDINGS: Brain: No evidence of acute infarction, hemorrhage, hydrocephalus, extra-axial collection or mass lesion/mass effect. Subcortical white matter and periventricular small vessel ischemic changes. Vascular: Intracranial atherosclerosis. Skull: Normal. Negative for fracture or focal lesion. Sinuses/Orbits: The visualized paranasal sinuses are essentially clear. The mastoid air cells are unopacified. Other: None. IMPRESSION: No evidence of acute intracranial abnormality. Small vessel ischemic changes. Electronically Signed   By: Julian Hy M.D.   On:  06/10/2019 08:18   MR BRAIN WO CONTRAST  Result Date: 06/14/2019 CLINICAL DATA:  Seizure, COVID positive EXAM: MRI HEAD WITHOUT CONTRAST TECHNIQUE: Multiplanar, multiecho pulse sequences of the brain and surrounding structures were obtained without intravenous contrast. COMPARISON:  None. FINDINGS: Brain: There is no acute infarction or intracranial hemorrhage. There is no intracranial mass, mass effect, or edema. There is no hydrocephalus or extra-axial fluid collection. Patchy and confluent T2 hyperintensity in the supratentorial white matter is nonspecific but may reflect mild to moderate chronic microvascular ischemic changes. Vascular: Major vessel flow voids at the skull base are preserved. Skull and upper cervical spine: Normal marrow signal is preserved. Sinuses/Orbits: Mild mucosal thickening.  Orbits are unremarkable. Other: Sella is unremarkable.  Mastoid air cells are clear. IMPRESSION: No intracranial mass.  Chronic microvascular ischemic changes. Electronically Signed   By: Macy Mis M.D.   On: 06/14/2019 16:05   DG Chest  Port 1 View  Result Date: 06/14/2019 CLINICAL DATA:  Shortness of breath.  COVID-19 positive EXAM: PORTABLE CHEST 1 VIEW COMPARISON:  June 10, 2019 FINDINGS: Lungs are clear. Heart size and pulmonary vascularity are normal. No adenopathy. There is mild degenerative change in the thoracic spine. IMPRESSION: Lungs clear.  Stable cardiac silhouette.  No adenopathy. Electronically Signed   By: Lowella Grip III M.D.   On: 06/14/2019 10:14   DG Chest Portable 1 View  Result Date: 06/10/2019 CLINICAL DATA:  COVID-19.  Cough EXAM: PORTABLE CHEST 1 VIEW COMPARISON:  06/05/2016 FINDINGS: The heart size and mediastinal contours are within normal limits. Both lungs are clear. The visualized skeletal structures are unremarkable. IMPRESSION: No active disease. Electronically Signed   By: Ulyses Jarred M.D.   On: 06/10/2019 04:06   EEG adult  Result Date:  06/15/2019 Lora Havens, MD     06/15/2019  9:54 AM Patient Name: [name] [name] MRN: [id number] Epilepsy Attending: Lora Havens Referring Physician/Provider: Dr Fuller Plan Date: 06/14/2019 Duration: 22.37 mins Patient history: 68yo M, COVID+ was brought in after a seizure like episode. Yesterday morning his wife notes that she heard a noise and found him in the bathroom.  She reports that his hips were shaking, he was foaming at the mouth as if he was trying to throw up or cough, and his eyes were wide open.  Thereafter, he stopped moving and did not appear to be breathing for a few seconds before responding to his wife saying his name. EEG to evaluate for seizure. Level of alertness: awake AEDs during EEG study: None Technical aspects: This EEG study was done with scalp electrodes positioned according to the 10-20 International system of electrode placement. Electrical activity was acquired at a sampling rate of 500Hz  and reviewed with a high frequency filter of 70Hz  and a low frequency filter of 1Hz . EEG data were recorded continuously and digitally stored. DESCRIPTION: The posterior dominant rhythm consists of 7Hz  activity of moderate voltage (25-35 uV) seen predominantly in posterior head regions, symmetric and reactive to eye opening and eye closing. EEG also showed continuous generalized 3-6hz  theta-delta slowing. Hyperventilation and photic stimulation were not performed. ABNORMALITY - Continuous slow, generalized - Background slow IMPRESSION: This study is suggestive of mild diffuse encephalopathy, non specific to etiology. No seizures or epileptiform discharges were seen throughout the recording. Lora Havens    Microbiology: Recent Results (from the past 240 hour(s))  Novel Coronavirus, NAA ([facility])     Status: Abnormal   Collection Time: 06/07/19 10:07 AM   Specimen: Nasopharyngeal(NP) swabs in vial transport medium   NASOPHARYNGE  TESTING  Result Value Ref Range Status    SARS-CoV-2, NAA Detected (A) Not Detected Final    Comment: This nucleic acid amplification test was developed and its performance characteristics determined by [employer]. Nucleic acid amplification tests include RT-PCR and TMA. This test has not been FDA cleared or approved. This test has been authorized by FDA under an Emergency Use Authorization (EUA). This test is only authorized for the duration of time the declaration that circumstances exist justifying the authorization of the emergency use of in vitro diagnostic tests for detection of SARS-CoV-2 virus and/or diagnosis of COVID-19 infection under section 564(b)(1) of the Act, 21 U.S.C. 253GUY-4(I) (1), unless the authorization is terminated or revoked sooner. When diagnostic testing is negative, the possibility of a false negative result should be considered in the context of a patient's recent exposures and the presence of clinical  signs and symptoms consistent with COVID-19. An individual without symptoms of COVID-19 and who is not shedding SARS-CoV-2 virus wo uld expect to have a negative (not detected) result in this assay.   Blood Culture (routine x 2)     Status: None   Collection Time: 06/10/19  7:16 AM   Specimen: BLOOD LEFT HAND  Result Value Ref Range Status   Specimen Description BLOOD LEFT HAND  Final   Special Requests   Final    BOTTLES DRAWN AEROBIC AND ANAEROBIC Blood Culture results may not be optimal due to an inadequate volume of blood received in culture bottles   Culture   Final    NO GROWTH 5 DAYS Performed at [facility], [place] [address], [place], [place] [address]    Report Status 06/15/2019 FINAL  Final  Blood Culture (routine x 2)     Status: None   Collection Time: 06/10/19  8:16 AM   Specimen: BLOOD  Result Value Ref Range Status   Specimen Description BLOOD LEFT ANTECUBITAL  Final   Special Requests   Final    BOTTLES DRAWN AEROBIC AND ANAEROBIC Blood Culture results may not be  optimal due to an excessive volume of blood received in culture bottles   Culture   Final    NO GROWTH 5 DAYS Performed at [facility], [place] [address] [place], [place] [address]    Report Status 06/15/2019 FINAL  Final  Culture, body fluid-bottle     Status: Abnormal   Collection Time: 06/10/19  8:26 PM   Specimen: Peritoneal Dialysate  Result Value Ref Range Status   Specimen Description PERITONEAL DIALYSATE  Final   Special Requests   Final    BOTTLES DRAWN AEROBIC AND ANAEROBIC Blood Culture adequate volume   Gram Stain   Final    GRAM POSITIVE COCCI IN BOTH AEROBIC AND ANAEROBIC BOTTLES CRITICAL RESULT CALLED TO, READ BACK BY AND VERIFIED WITH: S. MAFAT, RN AT 2120 ON 06/11/19 BY C. JESSUP, MT. Performed at [facility], [place] [address], [place], [place] [address]    Culture STAPHYLOCOCCUS HOMINIS (A)  Final   Report Status 06/13/2019 FINAL  Final   Organism ID, Bacteria STAPHYLOCOCCUS HOMINIS  Final      Susceptibility   Staphylococcus hominis - MIC*    CIPROFLOXACIN <=0.5 SENSITIVE Sensitive     ERYTHROMYCIN <=0.25 SENSITIVE Sensitive     GENTAMICIN <=0.5 SENSITIVE Sensitive     OXACILLIN RESISTANT Resistant     TETRACYCLINE 2 SENSITIVE Sensitive     VANCOMYCIN 1 SENSITIVE Sensitive     TRIMETH/SULFA <=10 SENSITIVE Sensitive     CLINDAMYCIN <=0.25 SENSITIVE Sensitive     RIFAMPIN <=0.5 SENSITIVE Sensitive     Inducible Clindamycin NEGATIVE Sensitive     * STAPHYLOCOCCUS HOMINIS  Gram stain     Status: None   Collection Time: 06/10/19  8:26 PM   Specimen: Peritoneal Dialysate  Result Value Ref Range Status   Specimen Description PERITONEAL DIALYSATE FLUID  Final   Special Requests NONE  Final   Gram Stain   Final    NO WBC SEEN NO ORGANISMS SEEN Performed at [facility], [address], [place],  [address]    Report Status 06/11/2019 FINAL  Final  C difficile quick scan w PCR reflex     Status: None   Collection Time: 06/10/19  8:40 PM    Specimen: STOOL  Result Value Ref Range Status   C  Diff antigen NEGATIVE NEGATIVE Final   C Diff toxin NEGATIVE NEGATIVE Final   C Diff interpretation No C. difficile detected.  Final    Comment: Performed at [facility], [place] [address] [place], [place] [address]  GI pathogen panel by PCR, stool     Status: None   Collection Time: 06/10/19  8:40 PM   Specimen: Stool  Result Value Ref Range Status   Plesiomonas shigelloides NOT DETECTED NOT DETECTED Final   Yersinia enterocolitica NOT DETECTED NOT DETECTED Final   Vibrio NOT DETECTED NOT DETECTED Final   Enteropathogenic E coli NOT DETECTED NOT DETECTED Final   E coli (ETEC) LT/ST NOT DETECTED NOT DETECTED Final   E coli 6314 by PCR Not applicable NOT DETECTED Final   Cryptosporidium by PCR NOT DETECTED NOT DETECTED Final   Entamoeba histolytica NOT DETECTED NOT DETECTED Final   Adenovirus F 40/41 NOT DETECTED NOT DETECTED Final   Norovirus GI/GII NOT DETECTED NOT DETECTED Final   Sapovirus NOT DETECTED NOT DETECTED Final    Comment: (NOTE) Performed At: [facility] [place], [place] [id number] Rush Farmer MD YI:[id number]    Vibrio cholerae NOT DETECTED NOT DETECTED Final   Campylobacter by PCR NOT DETECTED NOT DETECTED Final   Salmonella by PCR NOT DETECTED NOT DETECTED Final   E coli (STEC) NOT DETECTED NOT DETECTED Final   Enteroaggregative E coli NOT DETECTED NOT DETECTED Final   Shigella by PCR NOT DETECTED NOT DETECTED Final   Cyclospora cayetanensis NOT DETECTED NOT DETECTED Final   Astrovirus NOT DETECTED NOT DETECTED Final   G lamblia by PCR NOT DETECTED NOT DETECTED Final   Rotavirus A by PCR NOT DETECTED NOT DETECTED Final  Blood culture (routine x 2)     Status: None (Preliminary result)   Collection Time: 06/14/19  8:40 AM   Specimen: BLOOD  Result Value Ref Range Status   Specimen Description BLOOD LEFT ANTECUBITAL  Final   Special Requests   Final    BOTTLES DRAWN AEROBIC AND  ANAEROBIC Blood Culture adequate volume   Culture   Final    NO GROWTH 2 DAYS Performed at [facility], [address] [place], [place] [address]    Report Status PENDING  Incomplete  Blood culture (routine x 2)     Status: None (Preliminary result)   Collection Time: 06/14/19  8:54 AM   Specimen: BLOOD LEFT HAND  Result Value Ref Range Status   Specimen Description BLOOD LEFT HAND  Final   Special Requests   Final    BOTTLES DRAWN AEROBIC ONLY Blood Culture results may not be optimal due to an inadequate volume of blood received in culture bottles   Culture   Final    NO GROWTH 2 DAYS Performed at [facility], [place] [address] [place], [place] [address]    Report Status PENDING  Incomplete     Labs: Basic Metabolic Panel: Recent Labs  Lab 06/11/19 0239 06/12/19 0605 06/14/19 0822 06/15/19 0332 06/16/19 0848  NA 134* 134* 134* 137 135  K 4.4 4.1 3.2* 3.0* 3.3*  CL 97* 94* 92* 94* 96*  CO2 23 22 22 23 25   GLUCOSE 279* 241* 272* 216* 205*  BUN 54* 66* 80* 67* 53*  CREATININE 12.01* 12.84* 13.35* 11.58* 10.57*  CALCIUM 8.2* 8.4* 8.2* 7.7* 8.0*  MG 2.1 2.2  --   --  1.8  PHOS 6.4* 7.7*  --  6.4*  --  Liver Function Tests: Recent Labs  Lab 06/10/19 0241 06/11/19 0239 06/12/19 0605 06/14/19 0822 06/15/19 0332  AST 15 18 17  37  --   ALT 13 14 17  43  --   ALKPHOS 41 37* 39 40  --   BILITOT 0.7 0.3 0.8 0.3  --   PROT 7.3 6.4* 6.5 6.6  --   ALBUMIN 2.8* 2.2* 2.5* 2.8* 2.2*   Recent Labs  Lab 06/14/19 0822  LIPASE 57*   Recent Labs  Lab 06/14/19 0822  AMMONIA 18   CBC: Recent Labs  Lab 06/10/19 0241 06/10/19 0241 06/10/19 0740 06/10/19 0740 06/11/19 0239 06/11/19 0239 06/12/19 0605 06/14/19 0822 06/14/19 1655 06/15/19 0332 06/16/19 0848  WBC 9.3   < > 7.8   < > 5.2  --  8.5 7.8  --  7.5 12.6*  NEUTROABS 6.1  --  4.9  --  4.1  --  6.9 6.2  --   --   --   HGB 11.7*   < > 10.7*   < > 10.7*   < > 10.9* 11.0* 10.9* 10.1* 10.5*  HCT  37.1*   < > 32.8*   < > 32.7*   < > 32.0* 32.4* 32.0* 29.3* 31.1*  MCV 92.5   < > 90.4   < > 89.1  --  85.3 88.5  --  85.2 86.6  PLT 237   < > 211   < > 244  --  280 281  --  236 292   < > = values in this interval not displayed.   Cardiac Enzymes: No results for input(s): CKTOTAL, CKMB, CKMBINDEX, TROPONINI in the last 168 hours. BNP: BNP (last 3 results) Recent Labs    06/10/19 0740  BNP 45.5    ProBNP (last 3 results) No results for input(s): PROBNP in the last 8760 hours.  CBG: Recent Labs  Lab 06/15/19 0803 06/15/19 1151 06/15/19 1744 06/15/19 2119 06/16/19 0806  GLUCAP 169* 118* 192* 227* 191*       Signed:  Kayleen Memos, MD [facility] 06/16/2019, 11:34 AM

## 2019-06-19 LAB — CULTURE, BLOOD (ROUTINE X 2)
Culture: NO GROWTH
Culture: NO GROWTH
Special Requests: ADEQUATE

## 2019-06-26 ENCOUNTER — Telehealth: Payer: Self-pay | Admitting: Internal Medicine

## 2019-06-26 NOTE — Telephone Encounter (Signed)
Pt is requesting medical records

## 2019-06-26 NOTE — Telephone Encounter (Signed)
Pt is calling in stating that he needs a return to work note stating that he was in the hospital with COVID.  [employer] Mrs. [place] (Fax #[phone].

## 2019-06-27 NOTE — Telephone Encounter (Signed)
Virtual visit for hospital follow up?

## 2019-06-27 NOTE — Telephone Encounter (Signed)
Letter faxed and confirmed.  Patient is aware.

## 2019-06-27 NOTE — Telephone Encounter (Signed)
Quarantine ends on 2/10 per discharge summary. Ok to write note to return to work on 2/11.

## 2019-07-08 ENCOUNTER — Ambulatory Visit: Payer: BC Managed Care – PPO

## 2019-07-19 ENCOUNTER — Encounter: Payer: BC Managed Care – PPO | Admitting: Internal Medicine

## 2019-07-19 ENCOUNTER — Ambulatory Visit: Payer: BC Managed Care – PPO | Admitting: Gastroenterology

## 2019-07-19 ENCOUNTER — Encounter: Payer: Self-pay | Admitting: Gastroenterology

## 2019-07-19 ENCOUNTER — Other Ambulatory Visit (INDEPENDENT_AMBULATORY_CARE_PROVIDER_SITE_OTHER): Payer: BC Managed Care – PPO

## 2019-07-19 ENCOUNTER — Other Ambulatory Visit: Payer: Self-pay

## 2019-07-19 VITALS — BP 120/60 | HR 92 | Temp 98.1°F | Ht 68.75 in | Wt 155.4 lb

## 2019-07-19 DIAGNOSIS — K920 Hematemesis: Secondary | ICD-10-CM | POA: Diagnosis not present

## 2019-07-19 DIAGNOSIS — D500 Iron deficiency anemia secondary to blood loss (chronic): Secondary | ICD-10-CM | POA: Diagnosis not present

## 2019-07-19 DIAGNOSIS — Z8371 Family history of colonic polyps: Secondary | ICD-10-CM

## 2019-07-19 LAB — CBC WITH DIFFERENTIAL/PLATELET
Basophils Absolute: 0.1 10*3/uL (ref 0.0–0.1)
Basophils Relative: 1.1 % (ref 0.0–3.0)
Eosinophils Absolute: 0.2 10*3/uL (ref 0.0–0.7)
Eosinophils Relative: 2.7 % (ref 0.0–5.0)
HCT: 30.4 % — ABNORMAL LOW (ref 39.0–52.0)
Hemoglobin: 10.4 g/dL — ABNORMAL LOW (ref 13.0–17.0)
Lymphocytes Relative: 22.3 % (ref 12.0–46.0)
Lymphs Abs: 1.3 10*3/uL (ref 0.7–4.0)
MCHC: 34.3 g/dL (ref 30.0–36.0)
MCV: 92.8 fl (ref 78.0–100.0)
Monocytes Absolute: 0.9 10*3/uL (ref 0.1–1.0)
Monocytes Relative: 15.4 % — ABNORMAL HIGH (ref 3.0–12.0)
Neutro Abs: 3.5 10*3/uL (ref 1.4–7.7)
Neutrophils Relative %: 58.5 % (ref 43.0–77.0)
Platelets: 291 10*3/uL (ref 150.0–400.0)
RBC: 3.27 Mil/uL — ABNORMAL LOW (ref 4.22–5.81)
RDW: 16.1 % — ABNORMAL HIGH (ref 11.5–15.5)
WBC: 6 10*3/uL (ref 4.0–10.5)

## 2019-07-19 NOTE — Progress Notes (Signed)
Referring Provider: [name], [facility]* Primary Care Physician:  [name], [name], MD  Chief complaint:  Hospital follow-up   IMPRESSION:  Recent Coffee ground emesis while hospitalized GI blood loss anemia, hemoglobin 10.5 Recent Covid pneumonia Family history of colon polyps (mother) No known family history of colon cancer Normal screening colonoscopy with Dr. Sharlett Iles 2012  Mr. Broadnax reports that he is awaiting endoscopic evaluation to be placed back on active status on the kidney transplant list. Will proceed with EGD to further evaluate the source of recent coffee ground emesis. Check CBC given his recent anemia. He is due colonoscopy for his family history of colon polyps. Will continue PPI in the meantime.      PLAN: Continue pantoprazole 40 mg QAM CBC Colonoscopy with Plenvue EGD  Please see the "Patient Instructions" section for addition details about the plan.  HPI: [name] [name] is a 69 y.o. male who is seen in hospital follow-up. He has a history of laparoscopic prostatectomy for prostate adenocarcinoma 01/10/2019,  ESRD on peritoneal dialysis awaiting kidney transplant, IDDM, hypertension, chronic anemia, functional diarrhea, and peripheral neuropathy.   He was seen as an inpatient consultation for coffee ground emesis in late January 2021 after recent hospitalization for Covid pneumonia treated with Decadron and remdesivir. He was on low-dose aspirin.   He was treated empirically with PPI for possible esophagitis and PUD. Outpatient endoscopy after recovery was recommended.  He returns today in scheduled follow-up. There is no dysphagia, odynophagia, regurgitation,  heartburn, nausea, abdominal pain, change in bowel habits, melena, hematochezia, or bright red blood per rectum. There is no anorexia or recent change in weight.  He has a frequent cough and post-nasal drip.   Uses ASA 81 mg daily. No other NSAIDs.   Labs 06/16/19: hemoglobin 10.5,  MCV 86.6, RDW 12.6.  No labs since hospital discharge.   He had a normal colonoscopy with Dr. Sharlett Iles 07/22/10.  No prior upper endoscopy.   His mother has had colon polyps. No other known family history of colon cancer or polyps. No family history of uterine/endometrial cancer, pancreatic cancer or gastric/stomach cancer.   Past Medical History:  Diagnosis Date  . Anemia    as a younger man  . Arthritis    hands  . Chronic kidney disease    "starting to bother me now" (07/28/2012)  . Diabetes mellitus     type II  . Diarrhea, functional   . ED (erectile dysfunction)   . Hypertension   . Neuropathy   . Peripheral neuropathy     Past Surgical History:  Procedure Laterality Date  . APPENDECTOMY  1950's  . AV FISTULA PLACEMENT Right 05/14/2016   Procedure: ARTERIOVENOUS (AV) FISTULA CREATION- RIGHT ARM;  Surgeon: Serafina Mitchell, MD;  Location: [facility];  Service: Vascular;  Laterality: Right;  . COLONOSCOPY    . PROSTATE SURGERY      Current Outpatient Medications  Medication Sig Dispense Refill  . albuterol (VENTOLIN HFA) 108 (90 Base) MCG/ACT inhaler Inhale 2 puffs into the lungs every 6 (six) hours as needed for wheezing or shortness of breath. 18 g 0  . aspirin 81 MG tablet Take 1 tablet (81 mg total) by mouth daily. 30 tablet   . B Complex-C-Folic Acid (RENA-VITE RX) 1 MG TABS Take 1 mg by mouth See admin instructions. Take 1mg  daily on dialysis days. Take after treatment.    . calcitRIOL (ROCALTROL) 0.5 MCG capsule Take 0.5 mcg by mouth daily.     Marland Kitchen  cholecalciferol (VITAMIN D) 25 MCG tablet Take 1 tablet (1,000 Units total) by mouth daily. 30 tablet 1  . gentamicin cream (GARAMYCIN) 0.1 % Apply 1 application topically daily. 15 g 0  . Heparin Lock Flush (HEPARIN FLUSH) 10 UNIT/ML SOLN injection 50 Units See admin instructions. In dialysis bag    . pantoprazole (PROTONIX) 40 MG tablet Take 1 tablet (40 mg total) by mouth 2 (two) times daily for 7 days. 14 tablet 0  .  pantoprazole (PROTONIX) 40 MG tablet Take 1 tablet (40 mg total) by mouth daily. 30 tablet 1  . sevelamer carbonate (RENVELA) 800 MG tablet Take 1-3 tablets by mouth See admin instructions. Take 3 tablets by mouth three times daily with meals and 1 tablet twice daily with snacks.    Marland Kitchen UNABLE TO FIND TAKE 1 TABLET BY MOUTH EVERY DAY (ON DIALYSIS DAYS, TAKE AFTER DIALYSIS TREATMENT)     No current facility-administered medications for this visit.    Allergies as of 07/19/2019 - Review Complete 06/16/2019  Allergen Reaction Noted  . Other Rash 02/12/2017  . No known allergies  05/13/2016  . Bag balm  [albolene] Rash 02/03/2019    Family History  Problem Relation Age of Onset  . Colon polyps Mother   . Stroke Mother   . Diabetes Father   . Diabetes Cousin     Social History   Socioeconomic History  . Marital status: Married    Spouse name: Not on file  . Number of children: Not on file  . Years of education: Not on file  . Highest education level: Associate degree: academic program  Occupational History    Employer: [employer]    Comment: [employer]  Tobacco Use  . Smoking status: Former Smoker    Packs/day: 0.12    Years: 10.00    Pack years: 1.20    Types: Cigarettes    Quit date: 04/27/1986    Years since quitting: 33.2  . Smokeless tobacco: Never Used  . Tobacco comment: 07/28/2012 "quit smoking cigarettes 20-30 yr ago"  Substance and Sexual Activity  . Alcohol use: Yes    Alcohol/week: 3.0 standard drinks    Types: 3 Glasses of wine per week    Comment: maybe a couple a month  . Drug use: No  . Sexual activity: Not Currently    Partners: Male  Other Topics Concern  . Not on file  Social History Narrative  . Not on file   Social Determinants of Health   Financial Resource Strain:   . Difficulty of Paying Living Expenses: Not on file  Food Insecurity:   . Worried About Charity fundraiser in the Last Year: Not on file  . Ran Out of Food in  the Last Year: Not on file  Transportation Needs:   . Lack of Transportation (Medical): Not on file  . Lack of Transportation (Non-Medical): Not on file  Physical Activity:   . Days of Exercise per Week: Not on file  . Minutes of Exercise per Session: Not on file  Stress:   . Feeling of Stress : Not on file  Social Connections:   . Frequency of Communication with Friends and Family: Not on file  . Frequency of Social Gatherings with Friends and Family: Not on file  . Attends Religious Services: Not on file  . Active Member of Clubs or Organizations: Not on file  . Attends Archivist Meetings: Not on file  . Marital Status:  Not on file  Intimate Partner Violence:   . Fear of Current or Ex-Partner: Not on file  . Emotionally Abused: Not on file  . Physically Abused: Not on file  . Sexually Abused: Not on file    Physical Exam: General:   Alert,  well-nourished, pleasant and cooperative in NAD Head:  Normocephalic and atraumatic. Eyes:  Sclera clear, no icterus.   Conjunctiva pink. Ears:  Normal auditory acuity. Nose:  No deformity, discharge,  or lesions. Mouth:  No deformity or lesions.   Neck:  Supple; no masses or thyromegaly. Lungs:  Clear throughout to auscultation.   No wheezes. Heart:  Regular rate and rhythm; no murmurs. Abdomen:  Soft,nontender, nondistended, normal bowel sounds, no rebound or guarding. No hepatosplenomegaly.   Rectal:  Deferred  Msk:  Symmetrical. No boney deformities LAD: No inguinal or umbilical LAD Extremities:  No clubbing or edema. Neurologic:  Alert and  oriented x4;  grossly nonfocal Skin: Severe eczema. No other rash or bruise.  Psych:  Alert and cooperative. Normal mood and affect.    Fantashia Shupert L. Tarri Glenn, MD, MPH 07/19/2019, 12:33 PM

## 2019-07-19 NOTE — Patient Instructions (Signed)
You have been scheduled for an endoscopy and colonoscopy. Please follow the written instructions given to you at your visit today. Please pick up your prep supplies at the pharmacy within the next 1-3 days. If you use inhalers (even only as needed), please bring them with you on the day of your procedure.  Your provider has requested that you go to the basement level for lab work before leaving today. Press "B" on the elevator. The lab is located at the first door on the left as you exit the elevator.  Continue Pantoprazole.   I value your feedback and thank you for entrusting [name] with your care. If you get a [facility] patient survey, I would appreciate you taking the time to let us know about your experience today. Thank you!   Due to recent changes in healthcare laws, you may see the results of your imaging and laboratory studies on MyChart before your provider has had a chance to review them.  We understand that in some cases there may be results that are confusing or concerning to you. Not all laboratory results come back in the same time frame and the provider may be waiting for multiple results in order to interpret others.  Please give [name] 48 hours in order for your provider to thoroughly review all the results before contacting the office for clarification of your results.

## 2019-07-20 ENCOUNTER — Encounter: Payer: Self-pay | Admitting: Gastroenterology

## 2019-07-21 ENCOUNTER — Encounter: Payer: Self-pay | Admitting: *Deleted

## 2019-07-24 ENCOUNTER — Telehealth: Payer: Self-pay | Admitting: Gastroenterology

## 2019-07-24 ENCOUNTER — Encounter: Payer: BC Managed Care – PPO | Admitting: Gastroenterology

## 2019-07-24 NOTE — Telephone Encounter (Signed)
Please reschedule. Thanks

## 2019-07-24 NOTE — Telephone Encounter (Signed)
The pt has been advised that he needs to reschedule his appt for colon.  Pt will call at a later date.

## 2019-07-25 DIAGNOSIS — T7840XA Allergy, unspecified, initial encounter: Secondary | ICD-10-CM | POA: Insufficient documentation

## 2019-07-27 ENCOUNTER — Telehealth: Payer: Self-pay | Admitting: Gastroenterology

## 2019-07-27 MED ORDER — PLENVU 140 G PO SOLR: 1.0000 | ORAL | 0 refills | Status: DC

## 2019-07-27 NOTE — Telephone Encounter (Signed)
Spoke with patient and informed him that his new instructions are in [place] [facility] and new plenvu script has been sent to pharmacy.

## 2019-08-09 ENCOUNTER — Ambulatory Visit (AMBULATORY_SURGERY_CENTER): Payer: BC Managed Care – PPO | Admitting: Gastroenterology

## 2019-08-09 ENCOUNTER — Encounter: Payer: Self-pay | Admitting: Gastroenterology

## 2019-08-09 ENCOUNTER — Other Ambulatory Visit: Payer: Self-pay

## 2019-08-09 VITALS — BP 136/72 | HR 77 | Temp 96.0°F | Resp 11 | Ht 68.0 in | Wt 155.0 lb

## 2019-08-09 DIAGNOSIS — K5730 Diverticulosis of large intestine without perforation or abscess without bleeding: Secondary | ICD-10-CM | POA: Diagnosis not present

## 2019-08-09 DIAGNOSIS — K635 Polyp of colon: Secondary | ICD-10-CM | POA: Diagnosis not present

## 2019-08-09 DIAGNOSIS — K920 Hematemesis: Secondary | ICD-10-CM

## 2019-08-09 DIAGNOSIS — K2970 Gastritis, unspecified, without bleeding: Secondary | ICD-10-CM

## 2019-08-09 DIAGNOSIS — K2990 Gastroduodenitis, unspecified, without bleeding: Secondary | ICD-10-CM

## 2019-08-09 DIAGNOSIS — D500 Iron deficiency anemia secondary to blood loss (chronic): Secondary | ICD-10-CM

## 2019-08-09 DIAGNOSIS — K219 Gastro-esophageal reflux disease without esophagitis: Secondary | ICD-10-CM | POA: Diagnosis not present

## 2019-08-09 DIAGNOSIS — K319 Disease of stomach and duodenum, unspecified: Secondary | ICD-10-CM | POA: Diagnosis not present

## 2019-08-09 DIAGNOSIS — D125 Benign neoplasm of sigmoid colon: Secondary | ICD-10-CM

## 2019-08-09 MED ORDER — SODIUM CHLORIDE 0.9 % IV SOLN: 500.0000 mL | Freq: Once | INTRAVENOUS | Status: DC

## 2019-08-09 NOTE — Patient Instructions (Signed)
Discharge instructions given. Handouts on polyps,hemorrhoids, and Gastritis. No aspirin,ibuprofen,naproxen,or other non-steroidal anti-inflammatory drugs. Resume previous medications. YOU HAD AN ENDOSCOPIC PROCEDURE TODAY AT [facility]:   Refer to the procedure report that was given to you for any specific questions about what was found during the examination.  If the procedure report does not answer your questions, please call your gastroenterologist to clarify.  If you requested that your care partner not be given the details of your procedure findings, then the procedure report has been included in a sealed envelope for you to review at your convenience later.  YOU SHOULD EXPECT: Some feelings of bloating in the abdomen. Passage of more gas than usual.  Walking can help get rid of the air that was put into your GI tract during the procedure and reduce the bloating. If you had a lower endoscopy (such as a colonoscopy or flexible sigmoidoscopy) you may notice spotting of blood in your stool or on the toilet paper. If you underwent a bowel prep for your procedure, you may not have a normal bowel movement for a few days.  Please Note:  You might notice some irritation and congestion in your nose or some drainage.  This is from the oxygen used during your procedure.  There is no need for concern and it should clear up in a day or so.  SYMPTOMS TO REPORT IMMEDIATELY:   Following lower endoscopy (colonoscopy or flexible sigmoidoscopy):  Excessive amounts of blood in the stool  Significant tenderness or worsening of abdominal pains  Swelling of the abdomen that is new, acute  Fever of 100F or higher   Following upper endoscopy (EGD)  Vomiting of blood or coffee ground material  New chest pain or pain under the shoulder blades  Painful or persistently difficult swallowing  New shortness of breath  Fever of 100F or higher  Black, tarry-looking stools  For urgent or emergent  issues, a gastroenterologist can be reached at any hour by calling [phone]. Do not use MyChart messaging for urgent concerns.    DIET:  We do recommend a small meal at first, but then you may proceed to your regular diet.  Drink plenty of fluids but you should avoid alcoholic beverages for 24 hours.  ACTIVITY:  You should plan to take it easy for the rest of today and you should NOT DRIVE or use heavy machinery until tomorrow (because of the sedation medicines used during the test).    FOLLOW UP: Our staff will call the number listed on your records 48-72 hours following your procedure to check on you and address any questions or concerns that you may have regarding the information given to you following your procedure. If we do not reach you, we will leave a message.  We will attempt to reach you two times.  During this call, we will ask if you have developed any symptoms of COVID 19. If you develop any symptoms (ie: fever, flu-like symptoms, shortness of breath, cough etc.) before then, please call [phone].  If you test positive for Covid 19 in the 2 weeks post procedure, please call and report this information to [name].    If any biopsies were taken you will be contacted by phone or by letter within the next 1-3 weeks.  Please call us at [phone] if you have not heard about the biopsies in 3 weeks.    SIGNATURES/CONFIDENTIALITY: You and/or your care partner have signed paperwork which will be entered into  your electronic medical record.  These signatures attest to the fact that that the information above on your After Visit Summary has been reviewed and is understood.  Full responsibility of the confidentiality of this discharge information lies with you and/or your care-partner.

## 2019-08-09 NOTE — Op Note (Addendum)
[facility] Patient Name: Rockford Leinen Procedure Date: 08/09/2019 9:24 AM MRN: [id number] Endoscopist: Thornton Park MD, MD Age: 69 Referring MD:  Date of Birth: [birth year] Gender: Male Account #: [id number] Procedure:                Upper GI endoscopy Indications:              Recent Coffee ground emesis while hospitalized                           GI blood loss anemia, hemoglobin 10.5                           Recent Covid pneumonia Medicines:                Monitored Anesthesia Care Procedure:                Pre-Anesthesia Assessment:                           - Prior to the procedure, a History and Physical                            was performed, and patient medications and                            allergies were reviewed. The patient's tolerance of                            previous anesthesia was also reviewed. The risks                            and benefits of the procedure and the sedation                            options and risks were discussed with the patient.                            All questions were answered, and informed consent                            was obtained. Prior Anticoagulants: The patient has                            taken no previous anticoagulant or antiplatelet                            agents. ASA Grade Assessment: III - A patient with                            severe systemic disease. After reviewing the risks                            and benefits, the patient was deemed in  satisfactory condition to undergo the procedure.                           After obtaining informed consent, the endoscope was                            passed under direct vision. Throughout the                            procedure, the patient's blood pressure, pulse, and                            oxygen saturations were monitored continuously. The                            Endoscope was introduced through the  mouth, and                            advanced to the third part of duodenum. The upper                            GI endoscopy was accomplished without difficulty.                            The patient tolerated the procedure well. Scope In: Scope Out: Findings:                 LA Grade B (one or more mucosal breaks greater than                            5 mm, not extending between the tops of two mucosal                            folds) esophagitis with no bleeding was found.                           Diffuse minimal inflammation characterized by                            erythema, friability and granularity was found in                            the gastric body. Biopsies were taken from the                            antrum, body, and fundus with a cold forceps for                            histology. Estimated blood loss was minimal.                           Diffuse mildly erythematous mucosa without active  bleeding and with no stigmata of bleeding was found                            in the duodenal bulb. Biopsies were taken with a                            cold forceps for histology. Estimated blood loss                            was minimal.                           The cardia and gastric fundus were normal on                            retroflexion.                           The exam was otherwise without abnormality. Complications:            No immediate complications. Estimated blood loss:                            Minimal. Estimated Blood Loss:     Estimated blood loss was minimal. Impression:               - LA Grade B reflux esophagitis with no bleeding.                           - Gastritis. Biopsied.                           - Erythematous duodenopathy. Biopsied.                           - The examination was otherwise normal. Recommendation:           - Patient has a contact number available for                             emergencies. The signs and symptoms of potential                            delayed complications were discussed with the                            patient. Return to normal activities tomorrow.                            Written discharge instructions were provided to the                            patient.                           - Resume previous diet.                           -  Continue present medications including omeprazole                            40 mg QAM.                           - No aspirin, ibuprofen, naproxen, or other                            non-steroidal anti-inflammatory drugs.                           - Await pathology results.                           - Proceed with colonoscopy today as previously                            planned. Thornton Park MD, MD 08/09/2019 10:06:20 AM This report has been signed electronically.

## 2019-08-09 NOTE — Progress Notes (Signed)
Temperature taken by L.C., VS taken by D.T.

## 2019-08-09 NOTE — Progress Notes (Signed)
Called to room to assist during endoscopic procedure.  Patient ID and intended procedure confirmed with present staff. Received instructions for my participation in the procedure from the performing physician.  

## 2019-08-09 NOTE — Progress Notes (Signed)
A and O x3. Report to RN. Tolerated MAC anesthesia well.Teeth unchanged after procedure.

## 2019-08-09 NOTE — Op Note (Signed)
[facility] Patient Name: [name] [name] Procedure Date: 08/09/2019 9:24 AM MRN: [id number] Endoscopist: Thornton Park MD, MD Age: 69 Referring MD:  Date of Birth: 03-30-1951 Gender: Male Account #: [id number] Procedure:                Colonoscopy Indications:              Screening in patient with a family history of colon                            polyps in a 1st degree relative                           Family history of colon polyps (mother)                           No known family history of colon cancer                           Normal screening colonoscopy with Dr. Sharlett Iles                            2012Recent Medicines:                Monitored Anesthesia Care Procedure:                Pre-Anesthesia Assessment:                           - Prior to the procedure, a History and Physical                            was performed, and patient medications and                            allergies were reviewed. The patient's tolerance of                            previous anesthesia was also reviewed. The risks                            and benefits of the procedure and the sedation                            options and risks were discussed with the patient.                            All questions were answered, and informed consent                            was obtained. Prior Anticoagulants: The patient has                            taken no previous anticoagulant or antiplatelet  agents. ASA Grade Assessment: III - A patient with                            severe systemic disease. After reviewing the risks                            and benefits, the patient was deemed in                            satisfactory condition to undergo the procedure.                           After obtaining informed consent, the colonoscope                            was passed under direct vision. Throughout the   procedure, the patient's blood pressure, pulse, and                            oxygen saturations were monitored continuously. The                            Colonoscope was introduced through the anus and                            advanced to the 3 cm into the ileum. A second                            forward view of the right colon was performed. The                            colonoscopy was performed without difficulty. The                            patient tolerated the procedure well. The quality                            of the bowel preparation was good. The terminal                            ileum, ileocecal valve, appendiceal orifice, and                            rectum were photographed. Scope In: 9:43:11 AM Scope Out: 9:59:15 AM Scope Withdrawal Time: 0 hours 11 minutes 7 seconds  Total Procedure Duration: 0 hours 16 minutes 4 seconds  Findings:                 The perianal and digital rectal examinations were                            normal.                           A 2 mm polyp  was found in the sigmoid colon. The                            polyp was flat. The polyp was removed with a cold                            snare. Resection and retrieval were complete.                            Estimated blood loss was minimal.                           Non-bleeding internal hemorrhoids were found. The                            hemorrhoids were small.                           A few scattered diverticula were present in the                            ascending colon and sigmoid. The exam was otherwise                            without abnormality on direct and retroflexion                            views. Complications:            No immediate complications. Estimated blood loss:                            Minimal. Estimated Blood Loss:     Estimated blood loss was minimal. Impression:               - One 2 mm polyp in the sigmoid colon, removed with                             a cold snare. Resected and retrieved.                           - A few scattered diverticula.                           - Non-bleeding internal hemorrhoids.                           - The examination was otherwise normal on direct                            and retroflexion views. Recommendation:           - Patient has a contact number available for                            emergencies. The signs and symptoms of potential  delayed complications were discussed with the                            patient. Return to normal activities tomorrow.                            Written discharge instructions were provided to the                            patient.                           - Resume previous diet. High fiber diet encouraged.                           - Continue present medications.                           - Await pathology results.                           - Repeat colonoscopy in 5 years for screening                            purposes.                           - Emerging evidence supports eating a diet of                            fruits, vegetables, grains, calcium, and yogurt                            while reducing red meat and alcohol may reduce the                            risk of colon cancer.                           - Thank you for allowing me to be involved in your                            colon cancer prevention. Thornton Park MD, MD 08/09/2019 10:10:09 AM This report has been signed electronically.

## 2019-08-11 ENCOUNTER — Telehealth: Payer: Self-pay

## 2019-08-11 NOTE — Telephone Encounter (Signed)
  Follow up Call-  Call back number 08/09/2019  Post procedure Call Back phone  # [phone]  Permission to leave phone message Yes  Some recent data might be hidden     Patient questions:  Do you have a fever, pain , or abdominal swelling? No. Pain Score  0 *  Have you tolerated food without any problems? Yes.    Have you been able to return to your normal activities? Yes.    Do you have any questions about your discharge instructions: Diet   No. Medications  No. Follow up visit  No.  Do you have questions or concerns about your Care? No.  Actions: * If pain score is 4 or above: 1. No action needed, pain <4.Have you developed a fever since your procedure? no  2.   Have you had an respiratory symptoms (SOB or cough) since your procedure? no  3.   Have you tested positive for COVID 19 since your procedure no  4.   Have you had any family members/close contacts diagnosed with the COVID 19 since your procedure?  no   If yes to any of these questions please route to Joylene John, RN and Erenest Rasher, RN

## 2019-08-17 ENCOUNTER — Encounter: Payer: Self-pay | Admitting: Gastroenterology

## 2019-09-06 ENCOUNTER — Encounter: Payer: Self-pay | Admitting: Podiatry

## 2019-09-06 ENCOUNTER — Ambulatory Visit (INDEPENDENT_AMBULATORY_CARE_PROVIDER_SITE_OTHER): Payer: BC Managed Care – PPO | Admitting: Podiatry

## 2019-09-06 ENCOUNTER — Other Ambulatory Visit: Payer: Self-pay

## 2019-09-06 VITALS — Temp 97.2°F

## 2019-09-06 DIAGNOSIS — B351 Tinea unguium: Secondary | ICD-10-CM | POA: Diagnosis not present

## 2019-09-06 DIAGNOSIS — E1142 Type 2 diabetes mellitus with diabetic polyneuropathy: Secondary | ICD-10-CM | POA: Diagnosis not present

## 2019-09-06 DIAGNOSIS — N185 Chronic kidney disease, stage 5: Secondary | ICD-10-CM | POA: Diagnosis not present

## 2019-09-06 DIAGNOSIS — M79674 Pain in right toe(s): Secondary | ICD-10-CM

## 2019-09-06 DIAGNOSIS — M79675 Pain in left toe(s): Secondary | ICD-10-CM

## 2019-09-06 DIAGNOSIS — Q828 Other specified congenital malformations of skin: Secondary | ICD-10-CM | POA: Diagnosis not present

## 2019-09-06 NOTE — Progress Notes (Signed)
This patient returns to my office for at risk foot care.  This patient requires this care by a professional since this patient will be at risk due to having ESRD and diabetes. Patient has not been seen in over 6 months.  This patient is unable to cut nails himself since the patient cannot reach his nails.These nails are painful walking and wearing shoes. Patient has painful callus on both feet. This patient presents for at risk foot care today.  General Appearance  Alert, conversant and in no acute stress.  Vascular  Dorsalis pedis and posterior tibial  pulses are palpable  bilaterally.  Capillary return is within normal limits  bilaterally. Temperature is within normal limits  bilaterally.  Neurologic  Senn-Weinstein monofilament wire test within normal limits  bilaterally. Muscle power within normal limits bilaterally.  Nails Thick disfigured discolored nails with subungual debris  from hallux to fifth toes bilaterally. No evidence of bacterial infection or drainage bilaterally.  Orthopedic  No limitations of motion  feet .  No crepitus or effusions noted.  No bony pathology or digital deformities noted.  Skin  normotropic skin noted bilaterally.  No signs of infections or ulcers noted.   Porokeratosis sub 2 left and sub 3 right foot.  Onychomycosis  Pain in right toes  Pain in left toes  Porokeratosis  B/L.  Consent was obtained for treatment procedures.   Mechanical debridement of nails 1-5  bilaterally performed with a nail nipper.  Filed with dremel without incident. Debridement of porokeratosis with # 15 blade.   Return office visit   3 months                   Told patient to return for periodic foot care and evaluation due to potential at risk complications.   Gardiner Barefoot DPM

## 2019-09-20 ENCOUNTER — Other Ambulatory Visit: Payer: Self-pay

## 2019-09-20 ENCOUNTER — Encounter: Payer: BC Managed Care – PPO | Admitting: Internal Medicine

## 2019-09-21 ENCOUNTER — Encounter: Payer: Self-pay | Admitting: Internal Medicine

## 2019-09-21 ENCOUNTER — Ambulatory Visit (INDEPENDENT_AMBULATORY_CARE_PROVIDER_SITE_OTHER): Payer: BC Managed Care – PPO | Admitting: Internal Medicine

## 2019-09-21 VITALS — BP 120/70 | HR 82 | Temp 97.7°F | Ht 68.0 in | Wt 153.0 lb

## 2019-09-21 DIAGNOSIS — N186 End stage renal disease: Secondary | ICD-10-CM

## 2019-09-21 DIAGNOSIS — Z992 Dependence on renal dialysis: Secondary | ICD-10-CM | POA: Diagnosis not present

## 2019-09-21 DIAGNOSIS — E1122 Type 2 diabetes mellitus with diabetic chronic kidney disease: Secondary | ICD-10-CM

## 2019-09-21 DIAGNOSIS — I10 Essential (primary) hypertension: Secondary | ICD-10-CM

## 2019-09-21 DIAGNOSIS — E7849 Other hyperlipidemia: Secondary | ICD-10-CM | POA: Diagnosis not present

## 2019-09-21 DIAGNOSIS — E118 Type 2 diabetes mellitus with unspecified complications: Secondary | ICD-10-CM

## 2019-09-21 DIAGNOSIS — C61 Malignant neoplasm of prostate: Secondary | ICD-10-CM

## 2019-09-21 LAB — POCT GLYCOSYLATED HEMOGLOBIN (HGB A1C): Hemoglobin A1C: 6.9 % — AB (ref 4.0–5.6)

## 2019-09-21 NOTE — Patient Instructions (Signed)
-  Nice seeing you today!!  -Schedule follow up in 3 months for your physical.

## 2019-09-21 NOTE — Progress Notes (Signed)
Established Patient Office Visit     This visit occurred during the SARS-CoV-2 public health emergency.  Safety protocols were in place, including screening questions prior to the visit, additional usage of staff PPE, and extensive cleaning of exam room while observing appropriate contact time as indicated for disinfecting solutions.    CC/Reason for Visit: Follow-up chronic conditions  HPI: [name] [name] is a 69 y.o. male who is coming in today for the above mentioned reasons. Past Medical History is significant for: End-stage renal disease now on peritoneal dialysis followed by Dr. Joelyn Oms, he is on the transplant list.  He also has a history of suboptimally controlled hypertension also managed by nephrology, well-controlled type 2 diabetes not on medications, hyperlipidemia not on medications, prostate cancer status post TURP followed by Dr. Jeffie Pollock.  He has no acute complaints today, is in good spirits.  Since I last saw him he was hospitalized twice in January: First for COVID-19 pneumonia and then for hematemesis thought secondary to steroids given for COVID-19 infection.  He has been doing well since.  He has now #1 on the transplant list.  He was called twice for transplant however he had to refuse it as he was in his quarantine period.  For his COVID-19 infection.  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   Past Medical/Surgical History: Past Medical History:  Diagnosis Date  . Anemia    as a younger man  . Arthritis    hands  . Cataract   . Chronic kidney disease    "starting to bother me now" (07/28/2012)  . Diabetes mellitus     type II  . Diarrhea, functional   . ED (erectile dysfunction)   . Hypertension   . Neuropathy   . Peripheral neuropathy     Past Surgical History:  Procedure Laterality Date  . APPENDECTOMY  1950's  . AV FISTULA PLACEMENT Right 05/14/2016   Procedure: ARTERIOVENOUS (AV) FISTULA CREATION- RIGHT ARM;  Surgeon: Serafina Mitchell, MD;  Location: [place];  Service: Vascular;  Laterality: Right;  . COLONOSCOPY    . PROSTATE SURGERY    . UPPER GASTROINTESTINAL ENDOSCOPY      Social History:  reports that he quit smoking about 33 years ago. His smoking use included cigarettes. He has a 1.20 pack-year smoking history. He has never used smokeless tobacco. He reports current alcohol use of about 1.0 standard drinks of alcohol per week. He reports that he does not use drugs.  Allergies: Allergies  Allergen Reactions  . Other Rash    Oxyquinoline-white Pet-lanolin  . No Known Allergies   . Bag Balm  [Albolene] Rash    Family History:  Family History  Problem Relation Age of Onset  . Colon polyps Mother   . Stroke Mother   . Diabetes Father   . Diabetes Cousin   . Prostate cancer Brother   . Other Daughter        kidney problems  . Esophageal cancer Maternal Aunt   . Colon cancer Neg Hx   . Stomach cancer Neg Hx   . Rectal cancer Neg Hx      Current Outpatient Medications:  .  albuterol (VENTOLIN HFA) 108 (90 Base) MCG/ACT inhaler, Inhale 2 puffs into the lungs every 6 (six) hours as needed for wheezing or shortness of breath., Disp: 18 g, Rfl: 0 .  aspirin 81 MG tablet, Take 1 tablet (81 mg total) by mouth daily., Disp:  30 tablet, Rfl:  .  calcitRIOL (ROCALTROL) 0.5 MCG capsule, Take 0.5 mcg by mouth daily. , Disp: , Rfl:  .  cholecalciferol (VITAMIN D) 25 MCG tablet, Take 1 tablet (1,000 Units total) by mouth daily., Disp: 30 tablet, Rfl: 1 .  gentamicin cream (GARAMYCIN) 0.1 %, Apply 1 application topically daily., Disp: 15 g, Rfl: 0 .  Heparin Lock Flush (HEPARIN FLUSH) 10 UNIT/ML SOLN injection, 50 Units See admin instructions. In dialysis bag, Disp: , Rfl:  .  pantoprazole (PROTONIX) 40 MG tablet, Take 1 tablet (40 mg total) by mouth daily., Disp: 30 tablet, Rfl: 1 .  sevelamer carbonate (RENVELA) 800 MG tablet, Take 1-3 tablets by mouth See admin instructions. Take 3 tablets by mouth three  times daily with meals and 1 tablet twice daily with snacks., Disp: , Rfl:  .  UNABLE TO FIND, TAKE 1 TABLET BY MOUTH EVERY DAY (ON DIALYSIS DAYS, TAKE AFTER DIALYSIS TREATMENT), Disp: , Rfl:   Review of Systems:  Constitutional: Denies fever, chills, diaphoresis, appetite change and fatigue.  HEENT: Denies photophobia, eye pain, redness, hearing loss, ear pain, congestion, sore throat, rhinorrhea, sneezing, mouth sores, trouble swallowing, neck pain, neck stiffness and tinnitus.   Respiratory: Denies SOB, DOE, cough, chest tightness,  and wheezing.   Cardiovascular: Denies chest pain, palpitations and leg swelling.  Gastrointestinal: Denies nausea, vomiting, abdominal pain, diarrhea, constipation, blood in stool and abdominal distention.  Genitourinary: Denies dysuria, urgency, frequency, hematuria, flank pain and difficulty urinating.  Endocrine: Denies: hot or cold intolerance, sweats, changes in hair or nails, polyuria, polydipsia. Musculoskeletal: Denies myalgias, back pain, joint swelling, arthralgias and gait problem.  Skin: Denies pallor, rash and wound.  Neurological: Denies dizziness, seizures, syncope, weakness, light-headedness, numbness and headaches.  Hematological: Denies adenopathy. Easy bruising, personal or family bleeding history  Psychiatric/Behavioral: Denies suicidal ideation, mood changes, confusion, nervousness, sleep disturbance and agitation    Physical Exam: Vitals:   09/21/19 1137  BP: 120/70  Pulse: 82  Temp: 97.7 F (36.5 C)  TempSrc: Temporal  SpO2: 99%  Weight: 153 lb (69.4 kg)  Height: 5\' 8"  (1.727 m)    Body mass index is 23.26 kg/m.   Constitutional: NAD, calm, comfortable Eyes: PERRL, lids and conjunctivae normal ENMT: Mucous membranes are moist.  Respiratory: clear to auscultation bilaterally, no wheezing, no crackles. Normal respiratory effort. No accessory muscle use.  Cardiovascular: Regular rate and rhythm, no murmurs / rubs / gallops.  No Neurologic: Grossly intact and nonfocal  Psychiatric: Normal judgment and insight. Alert and oriented x 3. Normal mood.    Impression and Plan:  Type 2 diabetes mellitus with chronic kidney disease on chronic dialysis, without long-term current use of insulin ([place]) -Fair control with an A1c of 6.9 today.  He is not on medications. -His A1c has increased from 6.1, suspect related to steroids given for COVID-19 infection. -Follow-up in 3 months.  ESRD on peritoneal dialysis [facility]) -He continues to dialyze every night.  Essential hypertension -Well-controlled.  Other hyperlipidemia -No LDL on file. -Check when he returns for his physical.  Malignant neoplasm of prostate ([place]) -Status post TURP, followed by Dr. Jeffie Pollock.    Patient Instructions  -Nice seeing you today!!  -Schedule follow up in 3 months for your physical.     Lelon Frohlich, MD [facility]

## 2019-10-06 ENCOUNTER — Other Ambulatory Visit: Payer: Self-pay | Admitting: Nephrology

## 2019-10-06 DIAGNOSIS — K429 Umbilical hernia without obstruction or gangrene: Secondary | ICD-10-CM

## 2019-10-09 DIAGNOSIS — Z792 Long term (current) use of antibiotics: Secondary | ICD-10-CM | POA: Insufficient documentation

## 2019-10-09 DIAGNOSIS — T380X5A Adverse effect of glucocorticoids and synthetic analogues, initial encounter: Secondary | ICD-10-CM | POA: Insufficient documentation

## 2019-10-09 DIAGNOSIS — D849 Immunodeficiency, unspecified: Secondary | ICD-10-CM | POA: Insufficient documentation

## 2019-10-09 DIAGNOSIS — Z940 Kidney transplant status: Secondary | ICD-10-CM | POA: Insufficient documentation

## 2019-10-09 DIAGNOSIS — Z960 Presence of urogenital implants: Secondary | ICD-10-CM | POA: Insufficient documentation

## 2019-10-11 DIAGNOSIS — R5082 Postprocedural fever: Secondary | ICD-10-CM | POA: Insufficient documentation

## 2019-10-12 DIAGNOSIS — T8619 Other complication of kidney transplant: Secondary | ICD-10-CM | POA: Insufficient documentation

## 2019-10-13 DIAGNOSIS — N390 Urinary tract infection, site not specified: Secondary | ICD-10-CM | POA: Insufficient documentation

## 2019-10-14 DIAGNOSIS — E1140 Type 2 diabetes mellitus with diabetic neuropathy, unspecified: Secondary | ICD-10-CM | POA: Insufficient documentation

## 2019-10-14 DIAGNOSIS — R52 Pain, unspecified: Secondary | ICD-10-CM | POA: Insufficient documentation

## 2019-10-14 DIAGNOSIS — R0602 Shortness of breath: Secondary | ICD-10-CM | POA: Insufficient documentation

## 2019-10-14 DIAGNOSIS — E11649 Type 2 diabetes mellitus with hypoglycemia without coma: Secondary | ICD-10-CM | POA: Insufficient documentation

## 2019-10-14 DIAGNOSIS — L299 Pruritus, unspecified: Secondary | ICD-10-CM | POA: Insufficient documentation

## 2019-10-17 DIAGNOSIS — T782XXA Anaphylactic shock, unspecified, initial encounter: Secondary | ICD-10-CM | POA: Insufficient documentation

## 2019-10-17 DIAGNOSIS — I739 Peripheral vascular disease, unspecified: Secondary | ICD-10-CM | POA: Insufficient documentation

## 2019-11-23 DIAGNOSIS — N5231 Erectile dysfunction following radical prostatectomy: Secondary | ICD-10-CM | POA: Insufficient documentation

## 2019-11-28 ENCOUNTER — Encounter: Payer: BC Managed Care – PPO | Admitting: Internal Medicine

## 2019-12-13 ENCOUNTER — Ambulatory Visit (INDEPENDENT_AMBULATORY_CARE_PROVIDER_SITE_OTHER): Payer: BC Managed Care – PPO | Admitting: Podiatry

## 2019-12-13 ENCOUNTER — Encounter: Payer: Self-pay | Admitting: Podiatry

## 2019-12-13 ENCOUNTER — Other Ambulatory Visit: Payer: Self-pay

## 2019-12-13 DIAGNOSIS — M79675 Pain in left toe(s): Secondary | ICD-10-CM | POA: Diagnosis not present

## 2019-12-13 DIAGNOSIS — E1142 Type 2 diabetes mellitus with diabetic polyneuropathy: Secondary | ICD-10-CM

## 2019-12-13 DIAGNOSIS — M79674 Pain in right toe(s): Secondary | ICD-10-CM

## 2019-12-13 DIAGNOSIS — Q828 Other specified congenital malformations of skin: Secondary | ICD-10-CM

## 2019-12-13 DIAGNOSIS — B351 Tinea unguium: Secondary | ICD-10-CM

## 2019-12-13 DIAGNOSIS — N185 Chronic kidney disease, stage 5: Secondary | ICD-10-CM

## 2019-12-13 NOTE — Progress Notes (Signed)
This patient returns to my office for at risk foot care.  This patient requires this care by a professional since this patient will be at risk due to having ESRD and diabetes. Patient has not been seen in over 6 months.  This patient is unable to cut nails himself since the patient cannot reach his nails.These nails are painful walking and wearing shoes. Patient has painful callus on both feet. This patient presents for at risk foot care today.  General Appearance  Alert, conversant and in no acute stress.  Vascular  Dorsalis pedis and posterior tibial  pulses are palpable  bilaterally.  Capillary return is within normal limits  bilaterally. Temperature is within normal limits  bilaterally.  Neurologic  Senn-Weinstein monofilament wire test within normal limits  bilaterally. Muscle power within normal limits bilaterally.  Nails Thick disfigured discolored nails with subungual debris  from hallux to fifth toes bilaterally. No evidence of bacterial infection or drainage bilaterally.  Orthopedic  No limitations of motion  feet .  No crepitus or effusions noted.  No bony pathology or digital deformities noted.  Skin  normotropic skin noted bilaterally.  No signs of infections or ulcers noted.   Porokeratosis sub 2 left and sub 3 right foot.  Onychomycosis  Pain in right toes  Pain in left toes  Porokeratosis  B/L.  Consent was obtained for treatment procedures.   Mechanical debridement of nails 1-5  bilaterally performed with a nail nipper.  Filed with dremel without incident. Debridement of porokeratosis with # 15 blade. Dispense gel dispersion pad sheet.   Return office visit   3 months                   Told patient to return for periodic foot care and evaluation due to potential at risk complications.   Gardiner Barefoot DPM

## 2020-01-16 ENCOUNTER — Emergency Department (HOSPITAL_COMMUNITY): Payer: BC Managed Care – PPO

## 2020-01-16 ENCOUNTER — Emergency Department (HOSPITAL_COMMUNITY)
Admission: EM | Admit: 2020-01-16 | Discharge: 2020-01-17 | Disposition: A | Discharge status: Home / Self Care | Payer: BC Managed Care – PPO | Attending: Emergency Medicine | Admitting: Emergency Medicine

## 2020-01-16 ENCOUNTER — Other Ambulatory Visit: Payer: Self-pay

## 2020-01-16 ENCOUNTER — Encounter (HOSPITAL_COMMUNITY): Payer: Self-pay | Admitting: Emergency Medicine

## 2020-01-16 DIAGNOSIS — Z79899 Other long term (current) drug therapy: Secondary | ICD-10-CM | POA: Diagnosis not present

## 2020-01-16 DIAGNOSIS — R4182 Altered mental status, unspecified: Secondary | ICD-10-CM | POA: Diagnosis present

## 2020-01-16 DIAGNOSIS — I120 Hypertensive chronic kidney disease with stage 5 chronic kidney disease or end stage renal disease: Secondary | ICD-10-CM | POA: Insufficient documentation

## 2020-01-16 DIAGNOSIS — E1122 Type 2 diabetes mellitus with diabetic chronic kidney disease: Secondary | ICD-10-CM | POA: Diagnosis not present

## 2020-01-16 DIAGNOSIS — Z20822 Contact with and (suspected) exposure to covid-19: Secondary | ICD-10-CM | POA: Diagnosis not present

## 2020-01-16 DIAGNOSIS — Z7982 Long term (current) use of aspirin: Secondary | ICD-10-CM | POA: Insufficient documentation

## 2020-01-16 DIAGNOSIS — R000 Tachycardia, unspecified: Secondary | ICD-10-CM | POA: Diagnosis not present

## 2020-01-16 DIAGNOSIS — D72829 Elevated white blood cell count, unspecified: Secondary | ICD-10-CM | POA: Insufficient documentation

## 2020-01-16 DIAGNOSIS — N185 Chronic kidney disease, stage 5: Secondary | ICD-10-CM | POA: Diagnosis not present

## 2020-01-16 DIAGNOSIS — A419 Sepsis, unspecified organism: Secondary | ICD-10-CM | POA: Diagnosis not present

## 2020-01-16 LAB — CBC WITH DIFFERENTIAL/PLATELET
Abs Immature Granulocytes: 0 10*3/uL (ref 0.00–0.07)
Basophils Absolute: 0 10*3/uL (ref 0.0–0.1)
Basophils Relative: 1 %
Eosinophils Absolute: 0 10*3/uL (ref 0.0–0.5)
Eosinophils Relative: 2 %
HCT: 32.2 % — ABNORMAL LOW (ref 39.0–52.0)
Hemoglobin: 9.8 g/dL — ABNORMAL LOW (ref 13.0–17.0)
Lymphocytes Relative: 5 %
Lymphs Abs: 0.1 10*3/uL — ABNORMAL LOW (ref 0.7–4.0)
MCH: 29.3 pg (ref 26.0–34.0)
MCHC: 30.4 g/dL (ref 30.0–36.0)
MCV: 96.1 fL (ref 80.0–100.0)
Monocytes Absolute: 0 10*3/uL — ABNORMAL LOW (ref 0.1–1.0)
Monocytes Relative: 2 %
Myelocytes: 1 %
Neutro Abs: 1.8 10*3/uL (ref 1.7–7.7)
Neutrophils Relative %: 89 %
Platelets: 148 10*3/uL — ABNORMAL LOW (ref 150–400)
RBC: 3.35 MIL/uL — ABNORMAL LOW (ref 4.22–5.81)
RDW: 12.3 % (ref 11.5–15.5)
WBC: 2 10*3/uL — ABNORMAL LOW (ref 4.0–10.5)
nRBC: 0 % (ref 0.0–0.2)
nRBC: 0 /100 WBC

## 2020-01-16 LAB — COMPREHENSIVE METABOLIC PANEL
ALT: 7 U/L (ref 0–44)
AST: 17 U/L (ref 15–41)
Albumin: 3.7 g/dL (ref 3.5–5.0)
Alkaline Phosphatase: 53 U/L (ref 38–126)
Anion gap: 10 (ref 5–15)
BUN: 24 mg/dL — ABNORMAL HIGH (ref 8–23)
CO2: 21 mmol/L — ABNORMAL LOW (ref 22–32)
Calcium: 9.6 mg/dL (ref 8.9–10.3)
Chloride: 98 mmol/L (ref 98–111)
Creatinine, Ser: 1.88 mg/dL — ABNORMAL HIGH (ref 0.61–1.24)
GFR calc Af Amer: 41 mL/min — ABNORMAL LOW (ref >60)
GFR calc non Af Amer: 35 mL/min — ABNORMAL LOW (ref >60)
Glucose, Bld: 328 mg/dL — ABNORMAL HIGH (ref 70–99)
Potassium: 4.6 mmol/L (ref 3.5–5.1)
Sodium: 129 mmol/L — ABNORMAL LOW (ref 135–145)
Total Bilirubin: 0.5 mg/dL (ref 0.3–1.2)
Total Protein: 6.7 g/dL (ref 6.5–8.1)

## 2020-01-16 LAB — PROTIME-INR
INR: 1.2 (ref 0.8–1.2)
Prothrombin Time: 14.7 seconds (ref 11.4–15.2)

## 2020-01-16 LAB — LACTIC ACID, PLASMA: Lactic Acid, Venous: 1.6 mmol/L (ref 0.5–1.9)

## 2020-01-16 LAB — SARS CORONAVIRUS 2 BY RT PCR (HOSPITAL ORDER, PERFORMED IN ~~LOC~~ HOSPITAL LAB): SARS Coronavirus 2: NEGATIVE

## 2020-01-16 LAB — URINALYSIS, ROUTINE W REFLEX MICROSCOPIC
Bacteria, UA: NONE SEEN
Bilirubin Urine: NEGATIVE
Glucose, UA: >=500 mg/dL — AB
Ketones, ur: NEGATIVE mg/dL
Nitrite: NEGATIVE
Protein, ur: 100 mg/dL — AB
Specific Gravity, Urine: 1.015 (ref 1.005–1.030)
pH: 5 (ref 5.0–8.0)

## 2020-01-16 LAB — APTT: aPTT: 27 seconds (ref 24–36)

## 2020-01-16 MED ORDER — LACTATED RINGERS IV BOLUS
1000.0000 mL | Freq: Once | INTRAVENOUS | Status: AC
  Administered 2020-01-16: 1000 mL via INTRAVENOUS

## 2020-01-16 MED ORDER — LACTATED RINGERS IV SOLN: INTRAVENOUS | Status: DC

## 2020-01-16 MED ORDER — ACETAMINOPHEN 500 MG PO TABS
1000.0000 mg | ORAL_TABLET | Freq: Once | ORAL | Status: AC
  Administered 2020-01-16: 1000 mg via ORAL
  Filled 2020-01-16: qty 2

## 2020-01-16 MED ORDER — SODIUM CHLORIDE 0.9 % IV SOLN
2.0000 g | Freq: Once | INTRAVENOUS | Status: AC
  Administered 2020-01-16: 2 g via INTRAVENOUS
  Filled 2020-01-16: qty 2

## 2020-01-16 MED ORDER — VANCOMYCIN HCL 1500 MG/300ML IV SOLN
1500.0000 mg | Freq: Once | INTRAVENOUS | Status: AC
  Administered 2020-01-16: 1500 mg via INTRAVENOUS
  Filled 2020-01-16: qty 300

## 2020-01-16 NOTE — ED Notes (Signed)
Patient transported to CT 

## 2020-01-16 NOTE — ED Triage Notes (Signed)
Pt BIB [facility] from home, family reports pt is altered from his baseline. Alert to self only at this time. Hx kidney transplant x 6 months ago. Febrile, and tachycardic with EMS.

## 2020-01-16 NOTE — ED Provider Notes (Signed)
[facility] EMERGENCY DEPARTMENT Provider Note   CSN: [id number] Arrival date & time: 01/16/20  1910     History Chief Complaint  Patient presents with  . Altered Mental Status    [name] [name] is a 69 y.o. male has no history of diabetes, kidney transplant who is on Prograf and CellCept brought in by EMS for evaluation of altered mental status.  Daughter is at bedside who provides much of the history.  Reports that she lost all baseline normal yesterday.  Today, she went to pick up her daughter from patient's house and noted patient to be confused.  She states that he was holding a Gatorade bottle I would not put it down.  He is not answering questions appropriately which she says is not his baseline.  Mom had reported that this had been ongoing all day.  Patient was also very generalized weak prompting EMS call.  Patient states he feels fine.    EM LEVEL 5 CAVEAT DUE TO AMS  [name] [name] (Renal coordination): [phone] Dr. Altamease Oiler (Nephrology): [phone]  The history is provided by a relative.       Past Medical History:  Diagnosis Date  . Anemia    as a younger man  . Arthritis    hands  . Cataract   . Chronic kidney disease    "starting to bother me now" (07/28/2012)  . Diabetes mellitus     type II  . Diarrhea, functional   . ED (erectile dysfunction)   . Hypertension   . Neuropathy   . Peripheral neuropathy     Patient Active Problem List   Diagnosis Date Noted  . Erectile dysfunction after radical prostatectomy 11/23/2019  . Anaphylactic shock, unspecified, initial encounter 10/17/2019  . Peripheral vascular disease ([place]) 10/17/2019  . Pain, unspecified 10/14/2019  . Pruritus, unspecified 10/14/2019  . Shortness of breath 10/14/2019  . Type 2 diabetes mellitus with diabetic neuropathy, unspecified ([place]) 10/14/2019  . Acute UTI 10/13/2019  . Delayed graft function of kidney 10/12/2019  . Postoperative fever 10/11/2019  . Immunosuppressed  status ([place]) 10/09/2019  . Kidney transplanted 10/09/2019  . Prophylactic antibiotic 10/09/2019  . Status post placement of ureteral stent 10/09/2019  . Steroid-induced hyperglycemia 10/09/2019  . Allergy, unspecified, initial encounter 07/25/2019  . Hematemesis 06/14/2019  . COVID-19 virus infection 06/10/2019  . Fall at home, initial encounter 06/10/2019  . Syncope 06/10/2019  . Elevated troponin 06/10/2019  . Dependence on renal dialysis ([place]) 05/26/2018  . Cyst of kidney, acquired 11/29/2017  . Other long term (current) drug therapy 11/24/2017  . Unspecified jaundice 11/24/2017  . Elevated prostate specific antigen (PSA) 02/12/2017  . ESRD on peritoneal dialysis ([place]) 02/12/2017  . 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 Secondary hyperparathyroidism ([place]) 02/12/2017  . Malignant neoplasm of prostate ([place]) 02/08/2017  . Moderate protein-calorie malnutrition ([place]) 10/16/2016  . Iron deficiency anemia, unspecified 10/05/2016  . Hypokalemia 09/29/2016  . Anemia in chronic kidney disease 09/29/2016  . Coagulation defect, unspecified ([place]) 09/29/2016  . End stage renal disease ([place]) 09/29/2016  . Localized edema 09/29/2016  . Systemic inflammatory response syndrome (sirs) of non-infectious origin without acute organ dysfunction ([place]) 09/29/2016  . Pure hypercholesterolemia, unspecified 09/29/2016  . Preop cardiovascular exam 04/27/2016  . Diabetes mellitus type 2, controlled ([place]) 06/21/2015  . Other hyperlipidemia 12/11/2014  . Essential hypertension 09/10/2014  . SIRS (systemic inflammatory response syndrome) ([place]) 08/01/2014  . Bronchitis   . Sepsis ([place]) 07/31/2014  . Syncope  due to orthostatic hypotension 07/28/2012  . Chronic kidney disease, stage V ([place]) 07/28/2012  . Acute kidney failure ([place]) 07/28/2012  . Benign prostatic hyperplasia without lower urinary tract symptoms 06/25/2011  . Hereditary and idiopathic peripheral neuropathy 07/20/2007     Past Surgical History:  Procedure Laterality Date  . APPENDECTOMY  1950's  . AV FISTULA PLACEMENT Right 05/14/2016   Procedure: ARTERIOVENOUS (AV) FISTULA CREATION- RIGHT ARM;  Surgeon: Serafina Mitchell, MD;  Location: [facility];  Service: Vascular;  Laterality: Right;  . COLONOSCOPY    . PROSTATE SURGERY    . UPPER GASTROINTESTINAL ENDOSCOPY         Family History  Problem Relation Age of Onset  . Colon polyps Mother   . Stroke Mother   . Diabetes Father   . Diabetes Cousin   . Prostate cancer Brother   . Other Daughter        kidney problems  . Esophageal cancer Maternal Aunt   . Colon cancer Neg Hx   . Stomach cancer Neg Hx   . Rectal cancer Neg Hx     Social History   Tobacco Use  . Smoking status: Former Smoker    Packs/day: 0.12    Years: 10.00    Pack years: 1.20    Types: Cigarettes    Quit date: 04/27/1986    Years since quitting: 33.7  . Smokeless tobacco: Never Used  . Tobacco comment: 07/28/2012 "quit smoking cigarettes 20-30 yr ago"  Vaping Use  . Vaping Use: Never used  Substance Use Topics  . Alcohol use: Yes    Alcohol/week: 1.0 standard drink    Types: 1 Cans of beer per week    Comment: occ  . Drug use: No    Home Medications Prior to Admission medications   Medication Sig Start Date End Date Taking? Authorizing Provider  albuterol (VENTOLIN HFA) 108 (90 Base) MCG/ACT inhaler Inhale 2 puffs into the lungs every 6 (six) hours as needed for wheezing or shortness of breath. 06/12/19   Amin, Jeanella Flattery, MD  aspirin 81 MG tablet Take 1 tablet (81 mg total) by mouth daily. 08/09/14   Marletta Lor, MD  calcitRIOL (ROCALTROL) 0.5 MCG capsule Take 0.5 mcg by mouth daily.  12/09/17   [provider]  cholecalciferol (VITAMIN D) 25 MCG tablet Take 1 tablet (1,000 Units total) by mouth daily. 06/13/19   Amin, Jeanella Flattery, MD  gentamicin cream (GARAMYCIN) 0.1 % Apply 1 application topically daily. 06/17/19   Kayleen Memos, DO  Heparin Lock  Flush (HEPARIN FLUSH) 10 UNIT/ML SOLN injection 50 Units See admin instructions. In dialysis bag    [provider]  pantoprazole (PROTONIX) 40 MG tablet Take 1 tablet (40 mg total) by mouth daily. 06/16/19 06/15/20  Kayleen Memos, DO  sevelamer carbonate (RENVELA) 800 MG tablet Take 1-3 tablets by mouth See admin instructions. Take 3 tablets by mouth three times daily with meals and 1 tablet twice daily with snacks. 04/27/19   [provider]  UNABLE TO FIND TAKE 1 TABLET BY MOUTH EVERY DAY (ON DIALYSIS DAYS, TAKE AFTER DIALYSIS TREATMENT) 12/24/17   [provider]    Allergies    Other, No known allergies, and Bag balm  [albolene]  Review of Systems   Review of Systems  Unable to perform ROS: Mental status change    Physical Exam Updated Vital Signs BP (!) 141/69   Pulse 93   Temp 99.5 F (37.5 C)  Resp 17   SpO2 99%   Physical Exam Vitals and nursing note reviewed.  Constitutional:      Appearance: Normal appearance. He is well-developed.  HENT:     Head: Normocephalic and atraumatic.  Eyes:     General: Lids are normal.     Conjunctiva/sclera: Conjunctivae normal.     Pupils: Pupils are equal, round, and reactive to light.     Comments: PERRL. EOMs intact. No nystagmus. No neglect.   Neck:     Comments: Neck is supple and without rigidity. Cardiovascular:     Rate and Rhythm: Regular rhythm. Tachycardia present.     Pulses: Normal pulses.     Heart sounds: Normal heart sounds. No murmur heard.  No friction rub. No gallop.   Pulmonary:     Effort: Pulmonary effort is normal.     Breath sounds: Normal breath sounds.     Comments: Lungs clear to auscultation bilaterally.  Symmetric chest rise.  No wheezing, rales, rhonchi. Abdominal:     Palpations: Abdomen is soft. Abdomen is not rigid.     Tenderness: There is no abdominal tenderness. There is no guarding.     Comments: Abdomen is soft, non-distended, non-tender. No rigidity, No guarding.  No peritoneal signs.  Musculoskeletal:        General: Normal range of motion.     Cervical back: Full passive range of motion without pain.  Skin:    General: Skin is warm and dry.     Capillary Refill: Capillary refill takes less than 2 seconds.  Neurological:     Mental Status: He is alert.     Comments: Alert and oriented x2. Always commands, moves all extremities. Follows commands, Moves all extremities  5/5 strength to BUE and BLE  Sensation intact throughout all major nerve distributions  Psychiatric:        Speech: Speech normal.     ED Results / Procedures / Treatments   Labs (all labs ordered are listed, but only abnormal results are displayed) Labs Reviewed  COMPREHENSIVE METABOLIC PANEL - Abnormal; Notable for the following components:      Result Value   Sodium 129 (*)    CO2 21 (*)    Glucose, Bld 328 (*)    BUN 24 (*)    Creatinine, Ser 1.88 (*)    GFR calc non Af Amer 35 (*)    GFR calc Af Amer 41 (*)    All other components within normal limits  CBC WITH DIFFERENTIAL/PLATELET - Abnormal; Notable for the following components:   WBC 2.0 (*)    RBC 3.35 (*)    Hemoglobin 9.8 (*)    HCT 32.2 (*)    Platelets 148 (*)    Lymphs Abs 0.1 (*)    Monocytes Absolute 0.0 (*)    All other components within normal limits  URINALYSIS, ROUTINE W REFLEX MICROSCOPIC - Abnormal; Notable for the following components:   APPearance HAZY (*)    Glucose, UA >=500 (*)    Hgb urine dipstick SMALL (*)    Protein, ur 100 (*)    Leukocytes,Ua SMALL (*)    All other components within normal limits  SARS CORONAVIRUS 2 BY RT PCR (HOSPITAL ORDER, [facility])  CULTURE, BLOOD (ROUTINE X 2)  CULTURE, BLOOD (ROUTINE X 2)  URINE CULTURE  LACTIC ACID, PLASMA  PROTIME-INR  APTT  LACTIC ACID, PLASMA  PATHOLOGIST SMEAR REVIEW    EKG None  Radiology DG Chest  Port 1 View  Result Date: 01/16/2020 CLINICAL DATA:  Sepsis EXAM: PORTABLE CHEST 1 VIEW  COMPARISON:  06/14/2019 FINDINGS: The heart size and mediastinal contours are within normal limits. Both lungs are clear. The visualized skeletal structures are unremarkable. IMPRESSION: No active disease. Electronically Signed   By: Donavan Foil M.D.   On: 01/16/2020 19:50    Procedures .Critical Care Performed by: Volanda Napoleon, PA-C Authorized by: Volanda Napoleon, PA-C   Critical care provider statement:    Critical care time (minutes):  35   Critical care was necessary to treat or prevent imminent or life-threatening deterioration of the following conditions:  Sepsis   Critical care was time spent personally by me on the following activities:  Discussions with consultants, evaluation of patient's response to treatment, examination of patient, ordering and performing treatments and interventions, ordering and review of laboratory studies, ordering and review of radiographic studies, pulse oximetry, re-evaluation of patient's condition, obtaining history from patient or surrogate and review of old charts   (including critical care time)  Medications Ordered in ED Medications  lactated ringers infusion (has no administration in time range)  vancomycin (VANCOREADY) IVPB 1500 mg/300 mL (1,500 mg Intravenous New Bag/Given 01/16/20 2137)  acetaminophen (TYLENOL) tablet 1,000 mg (1,000 mg Oral Given 01/16/20 1938)  lactated ringers bolus 1,000 mL ( Intravenous Restarted 01/16/20 2021)  ceFEPIme (MAXIPIME) 2 g in sodium chloride 0.9 % 100 mL IVPB (0 g Intravenous Stopped 01/16/20 2112)    ED Course  I have reviewed the triage vital signs and the nursing notes.  Pertinent labs & imaging results that were available during my care of the patient were reviewed by me and considered in my medical decision making (see chart for details).    MDM Rules/Calculators/A&P                          69 year old male who presents for evaluation of altered mental status that began today.  History of  kidney transplant is on Prograf and CellCept.  He also takes Bactrim and valacyclovir chronically.  On ED arrival, patient is febrile, tachycardic.  He is alert and oriented x2.  He can tell me his name and where at but he does not know what year it is.  Benign abdominal exam.  Lungs clear to auscultation.  He follows all commands.  Given his vitals, concern for sepsis.  Code sepsis initiated patient covered with broad-spectrum antibiotics.  CBC shows leukopenia of 2.0.  Hemoglobin is 9.8.  Platelets are 148.  CMP shows sodium 129.  CO2 is 21. Glucose is 328.  BUN is 24, creatinine is 1.88. UA showed leukocytes, pyuria, hemoglobin.  Chest x-ray shows no active disease.  Urine shows small hemoglobin, small amount of leukocytes, pyuria.  No bacteria seen.  Urine culture sent.  Discussed patient with Dr. Altamease Oiler (Nephrology [facility]).  Patient's baseline creatinine after kidney transplant was 1.9.  He had a leukopia of 1.5 on 12/18/2019.  He agrees with plans for admission given concern for sepsis versus rejection.  He recommends obtaining CT on pelvis without contrast for evaluation.  CT abdomen pelvis is unremarkable, patient can be admitted.  He would like to try do transfer line to begin with to get patient admitted to [facility] but if [facility] is backed up, patient can be admitted to our hospitalist service with plans to transfer eventually.  Updated daughter on plan. She is agreeable.   Patient signed out to [name] [name]  [name] Coss, PA-C with CT pending.  CT is normal, patient will need to be admitted.  To transfer line will need to be consulted.  If you cannot take them, patient can be admitted to our hospitalist service with plans for transfer to [name].  JERRICO COVELLO was evaluated in Emergency Department on 01/16/2020 for the symptoms described in the history of present illness. He was evaluated in the context of the global COVID-19 pandemic, which necessitated consideration that the patient might be at risk for infection  with the SARS-CoV-2 virus that causes COVID-19. Institutional protocols and algorithms that pertain to the evaluation of patients at risk for COVID-19 are in a state of rapid change based on information released by regulatory bodies including the CDC and federal and state organizations. These policies and algorithms were followed during the patient's care in the ED.  Portions of this note were generated with [employer]. Dictation errors may occur despite best attempts at proofreading.  Final Clinical Impression(s) / ED Diagnoses Final diagnoses:  Sepsis, due to unspecified organism, unspecified whether acute organ dysfunction present [facility])    Rx / DC Orders ED Discharge Orders    None       Desma Mcgregor 01/16/20 2153    Tegeler, Gwenyth Allegra, MD 01/17/20 1146

## 2020-01-16 NOTE — ED Notes (Signed)
Per family at bedside pt LKW 01/15/20

## 2020-01-16 NOTE — ED Provider Notes (Signed)
Physical Exam  BP (!) 165/73   Pulse 96   Temp 99.5 F (37.5 C)   Resp 17   SpO2 98%   Physical Exam Vitals and nursing note reviewed.  Constitutional:      General: He is not in acute distress.    Appearance: He is well-developed. He is not diaphoretic.  HENT:     Head: Normocephalic and atraumatic.  Eyes:     General: No scleral icterus.    Conjunctiva/sclera: Conjunctivae normal.  Pulmonary:     Effort: Pulmonary effort is normal. No respiratory distress.  Musculoskeletal:     Cervical back: Normal range of motion.  Skin:    Findings: No rash.  Neurological:     Mental Status: He is alert.     ED Course/Procedures     Procedures  MDM   Care of patient assumed from PA Layden at 9:30 PM.  Agree with history, physical exam and plan.  See their note for further details.  Briefly, 69 y.o. male with PMH/PSH as below who presents with altered mental status.  Daughter at bedside provides much of history stating that when she saw the patient today to pick up her daughter he was confused.  Was not answering questions appropriately which is not his baseline.  Also reports generalized weakness.  Patient without any acute complaints. Found to be febrile, tachycardic here and code sepsis was activated.  Given broad-spectrum antibiotics for unknown source. Urinalysis with some leukocytes and pyuria but otherwise unremarkable.  Chest x-ray without any acute findings.  Lactic acid level is normal. History of renal transplant at [facility] in May 2021.   Past Medical History:  Diagnosis Date  . Anemia    as a younger man  . Arthritis    hands  . Cataract   . Chronic kidney disease    "starting to bother me now" (07/28/2012)  . Diabetes mellitus     type II  . Diarrhea, functional   . ED (erectile dysfunction)   . Hypertension   . Neuropathy   . Peripheral neuropathy    Past Surgical History:  Procedure Laterality Date  . APPENDECTOMY  1950's  . AV FISTULA PLACEMENT Right  05/14/2016   Procedure: ARTERIOVENOUS (AV) FISTULA CREATION- RIGHT ARM;  Surgeon: Serafina Mitchell, MD;  Location: [facility];  Service: Vascular;  Laterality: Right;  . COLONOSCOPY    . PROSTATE SURGERY    . UPPER GASTROINTESTINAL ENDOSCOPY        Current Plan: Previous provider did contact [place] nephrology, Dr. Altamease Oiler, where patient received his transplant in May 2021.  They asked that we obtain a CT of the abdomen pelvis to evaluate for acute pathology, he will need to be admitted sepsis vs rejection.  Will attempt to transfer him to [facility] once CT results.    MDM/ED Course: 10:12 PM Fever improved. Awaiting CT results.  10:53 PM CT showing enlarged, edematous right lower quadrant renal transplant with stranding possibly in the kidney infection versus infarction versus hematoma.  11:05 PM Spoke to [facility] transfer line regarding patient's CT findings and history of transplant on their service.  After informing them of patient's current vital signs, lab findings, CT findings, nurse states that she will contact the transplant team to get them in contact with me.  11:43 PM Awaiting call back from [facility] transfer line for further recommendations. I have asked the secretary to re page them.  11:53 PM  Care handed off to oncoming team pending re page  from [facility] for ultimate disposition.  Consults: [facility] nephrology and transfer line   Significant labs/images: CT ABDOMEN PELVIS WO CONTRAST  Result Date: 01/16/2020 CLINICAL DATA:  Abdominal pain EXAM: CT ABDOMEN AND PELVIS WITHOUT CONTRAST TECHNIQUE: Multidetector CT imaging of the abdomen and pelvis was performed following the standard protocol without IV contrast. COMPARISON:  None. FINDINGS: LOWER CHEST: Normal. HEPATOBILIARY: Normal hepatic contours. No intra- or extrahepatic biliary dilatation. Normal gallbladder. PANCREAS: Normal pancreas. No ductal dilatation or peripancreatic fluid collection. SPLEEN: Normal. ADRENALS/URINARY TRACT: The adrenal  glands are normal. There is mild atrophy of the native kidneys. There is a right lower quadrant renal transplant that is markedly enlarged with perinephric edema/stranding and hydronephrosis. There is a small perinephric collection at the inferior aspect of the transplant kidney. The urinary bladder is normal for degree of distention STOMACH/BOWEL: There is no hiatal hernia. Normal duodenal course and caliber. No small bowel dilatation or inflammation. No focal colonic abnormality. Status post appendectomy. VASCULAR/LYMPHATIC: There is calcific atherosclerosis of the abdominal aorta. No lymphadenopathy. REPRODUCTIVE: Status post prostatectomy. MUSCULOSKELETAL. No bony spinal canal stenosis or focal osseous abnormality. OTHER: None. IMPRESSION: 1. Enlarged, edematous right lower quadrant renal transplant with perinephric edema/stranding and hydronephrosis, possibly indicating infection, infarction or hematoma. Dedicated renal transplant ultrasound and Doppler examination recommended. 2. Aortic Atherosclerosis (ICD10-I70.0). These results will be called to the ordering clinician or representative by the Radiologist Assistant, and communication documented in the PACS or [employer]. Electronically Signed   By: Ulyses Jarred M.D.   On: 01/16/2020 22:43   DG Chest Port 1 View  Result Date: 01/16/2020 CLINICAL DATA:  Sepsis EXAM: PORTABLE CHEST 1 VIEW COMPARISON:  06/14/2019 FINDINGS: The heart size and mediastinal contours are within normal limits. Both lungs are clear. The visualized skeletal structures are unremarkable. IMPRESSION: No active disease. Electronically Signed   By: Donavan Foil M.D.   On: 01/16/2020 19:50    I personally reviewed and interpreted all labs.  The plan for this patient was discussed with Dr. Sherry Ruffing, who voiced agreement and who oversaw evaluation and treatment of this patient.  Portions of this note were generated with [employer]. Dictation errors may occur  despite best attempts at proofreading.     Delia Heady, PA-C 01/16/20 2353    Tegeler, Gwenyth Allegra, MD 01/17/20 [phone]

## 2020-01-16 NOTE — ED Notes (Signed)
[facility] transfer line called for possible transfer with nephrology per PA [facility] request.

## 2020-01-16 NOTE — ED Notes (Signed)
Ct ap   Delia Heady, [place] 01/16/20 2307

## 2020-01-17 DIAGNOSIS — R509 Fever, unspecified: Secondary | ICD-10-CM | POA: Insufficient documentation

## 2020-01-17 MED ORDER — ACETAMINOPHEN 500 MG PO TABS
500.0000 mg | ORAL_TABLET | Freq: Once | ORAL | Status: AC
  Administered 2020-01-17: 500 mg via ORAL
  Filled 2020-01-17: qty 1

## 2020-01-17 NOTE — ED Notes (Signed)
[facility] here to transport pt by ground.

## 2020-01-17 NOTE — ED Provider Notes (Signed)
12:17 AM Received call back from Dr. Juan Quam at [facility], who accepts in transfer.  [facility] working on bed and transport.  Patient reassessed and appears stable for transport.   Montine Circle, PA-C 01/17/20 0018    Tegeler, Gwenyth Allegra, MD 01/17/20 0030

## 2020-01-17 NOTE — ED Notes (Signed)
[facility] contacted this RN stating that they have a truck coming on duty at [place] and would be coming from a location approx 1.5 hours away.

## 2020-01-17 NOTE — ED Notes (Signed)
Pt wheeled to restroom.

## 2020-01-18 LAB — URINE CULTURE: Culture: >=100000 — AB

## 2020-01-18 LAB — PATHOLOGIST SMEAR REVIEW

## 2020-01-21 LAB — CULTURE, BLOOD (ROUTINE X 2)
Culture: NO GROWTH
Special Requests: ADEQUATE

## 2020-03-08 DIAGNOSIS — N3281 Overactive bladder: Secondary | ICD-10-CM | POA: Insufficient documentation

## 2020-03-15 ENCOUNTER — Ambulatory Visit (INDEPENDENT_AMBULATORY_CARE_PROVIDER_SITE_OTHER): Payer: Medicare Other | Admitting: Podiatry

## 2020-03-15 ENCOUNTER — Other Ambulatory Visit: Payer: Self-pay

## 2020-03-15 ENCOUNTER — Encounter: Payer: Self-pay | Admitting: Podiatry

## 2020-03-15 DIAGNOSIS — B351 Tinea unguium: Secondary | ICD-10-CM

## 2020-03-15 DIAGNOSIS — M79675 Pain in left toe(s): Secondary | ICD-10-CM

## 2020-03-15 DIAGNOSIS — N185 Chronic kidney disease, stage 5: Secondary | ICD-10-CM | POA: Diagnosis not present

## 2020-03-15 DIAGNOSIS — M79674 Pain in right toe(s): Secondary | ICD-10-CM

## 2020-03-15 DIAGNOSIS — Q828 Other specified congenital malformations of skin: Secondary | ICD-10-CM | POA: Diagnosis not present

## 2020-03-15 DIAGNOSIS — E1142 Type 2 diabetes mellitus with diabetic polyneuropathy: Secondary | ICD-10-CM

## 2020-03-15 NOTE — Progress Notes (Signed)
This patient returns to my office for at risk foot care.  This patient requires this care by a professional since this patient will be at risk due to having ESRD and diabetes. Patient has not been seen in over 6 months.  This patient is unable to cut nails himself since the patient cannot reach his nails.These nails are painful walking and wearing shoes. Patient has painful callus on both feet. This patient presents for at risk foot care today.  General Appearance  Alert, conversant and in no acute stress.  Vascular  Dorsalis pedis and posterior tibial  pulses are palpable  bilaterally.  Capillary return is within normal limits  bilaterally. Temperature is within normal limits  bilaterally.  Neurologic  Senn-Weinstein monofilament wire test within normal limits  bilaterally. Muscle power within normal limits bilaterally.  Nails Thick disfigured discolored nails with subungual debris  from hallux to fifth toes bilaterally. No evidence of bacterial infection or drainage bilaterally.  Orthopedic  No limitations of motion  feet .  No crepitus or effusions noted.  No bony pathology or digital deformities noted.  Skin  normotropic skin noted bilaterally.  No signs of infections or ulcers noted.   Porokeratosis sub 2 left and sub 3 right foot.  Onychomycosis  Pain in right toes  Pain in left toes  Porokeratosis  B/L.  Consent was obtained for treatment procedures.   Mechanical debridement of nails 1-5  bilaterally performed with a nail nipper.  Filed with dremel without incident. Debridement of porokeratosis with # 15 blade. Dispense gel dispersion pad sheet.   Return office visit   3 months                   Told patient to return for periodic foot care and evaluation due to potential at risk complications.   Gardiner Barefoot DPM

## 2020-06-21 ENCOUNTER — Ambulatory Visit (INDEPENDENT_AMBULATORY_CARE_PROVIDER_SITE_OTHER): Payer: Medicare Other | Admitting: Podiatry

## 2020-06-21 ENCOUNTER — Encounter: Payer: Self-pay | Admitting: Podiatry

## 2020-06-21 ENCOUNTER — Other Ambulatory Visit: Payer: Self-pay

## 2020-06-21 DIAGNOSIS — M79674 Pain in right toe(s): Secondary | ICD-10-CM

## 2020-06-21 DIAGNOSIS — B351 Tinea unguium: Secondary | ICD-10-CM | POA: Diagnosis not present

## 2020-06-21 DIAGNOSIS — M79675 Pain in left toe(s): Secondary | ICD-10-CM

## 2020-06-21 DIAGNOSIS — E1142 Type 2 diabetes mellitus with diabetic polyneuropathy: Secondary | ICD-10-CM

## 2020-06-21 DIAGNOSIS — Q828 Other specified congenital malformations of skin: Secondary | ICD-10-CM

## 2020-06-21 DIAGNOSIS — N185 Chronic kidney disease, stage 5: Secondary | ICD-10-CM

## 2020-06-21 NOTE — Progress Notes (Signed)
This patient returns to my office for at risk foot care.  This patient requires this care by a professional since this patient will be at risk due to having ESRD and diabetes. Patient has not been seen in over 6 months.  This patient is unable to cut nails himself since the patient cannot reach his nails.These nails are painful walking and wearing shoes. Patient has painful callus on both feet. This patient presents for at risk foot care today.  General Appearance  Alert, conversant and in no acute stress.  Vascular  Dorsalis pedis and posterior tibial  pulses are palpable  bilaterally.  Capillary return is within normal limits  bilaterally. Temperature is within normal limits  bilaterally.  Neurologic  Senn-Weinstein monofilament wire test within normal limits  bilaterally. Muscle power within normal limits bilaterally.  Nails Thick disfigured discolored nails with subungual debris  from hallux to fifth toes bilaterally. No evidence of bacterial infection or drainage bilaterally.  Orthopedic  No limitations of motion  feet .  No crepitus or effusions noted.  No bony pathology or digital deformities noted.  Skin  normotropic skin noted bilaterally.  No signs of infections or ulcers noted.   Porokeratosis sub 2 left and sub 3 right foot.  Onychomycosis  Pain in right toes  Pain in left toes  Porokeratosis  B/L.  Consent was obtained for treatment procedures.   Mechanical debridement of nails 1-5  bilaterally performed with a nail nipper.  Filed with dremel without incident. Debridement of porokeratosis with # 15 blade.    Return office visit   3 months                   Told patient to return for periodic foot care and evaluation due to potential at risk complications.   Gardiner Barefoot DPM

## 2020-07-03 ENCOUNTER — Encounter (HOSPITAL_COMMUNITY): Payer: Self-pay

## 2020-07-03 ENCOUNTER — Other Ambulatory Visit: Payer: Self-pay

## 2020-07-03 ENCOUNTER — Ambulatory Visit (HOSPITAL_COMMUNITY)
Admission: RE | Admit: 2020-07-03 | Discharge: 2020-07-03 | Disposition: A | Discharge status: Home / Self Care | Payer: Medicare Other | Source: Ambulatory Visit | Attending: Family Medicine | Admitting: Family Medicine

## 2020-07-03 VITALS — BP 170/82 | HR 100 | Temp 98.3°F | Resp 20

## 2020-07-03 DIAGNOSIS — M26622 Arthralgia of left temporomandibular joint: Secondary | ICD-10-CM

## 2020-07-03 DIAGNOSIS — I10 Essential (primary) hypertension: Secondary | ICD-10-CM

## 2020-07-03 MED ORDER — PREDNISONE 20 MG PO TABS: 40.0000 mg | ORAL_TABLET | Freq: Every day | ORAL | 0 refills | Status: DC

## 2020-07-03 NOTE — ED Triage Notes (Signed)
Pt presents with left side jaw pain that is unsure if dental or ear, pain is relieved at the moment

## 2020-07-03 NOTE — Discharge Instructions (Signed)
Your blood pressure was noted to be elevated during your visit today. If you are currently taking medication for high blood pressure, please ensure you are taking this as directed. If you do not have a history of high blood pressure and your blood pressure remains persistently elevated, you may need to begin taking a medication at some point. You may return here within the next few days to recheck if unable to see your primary care provider or if you do not have a one.  BP (!) 170/82   Pulse 100   Temp 98.3 F (36.8 C)   Resp 20   SpO2 99%   BP Readings from Last 3 Encounters:  07/03/20 (!) 170/82  01/17/20 (!) 156/74  09/21/19 120/70

## 2020-07-04 NOTE — ED Provider Notes (Signed)
[place]   [id number] 07/03/20 Arrival Time: 7858  ASSESSMENT & PLAN:  1. Arthralgia of left temporomandibular joint   2. Elevated blood pressure reading in office with diagnosis of hypertension      Discharge Instructions      Your blood pressure was noted to be elevated during your visit today. If you are currently taking medication for high blood pressure, please ensure you are taking this as directed. If you do not have a history of high blood pressure and your blood pressure remains persistently elevated, you may need to begin taking a medication at some point. You may return here within the next few days to recheck if unable to see your primary care provider or if you do not have a one.  BP (!) 170/82   Pulse 100   Temp 98.3 F (36.8 C)   Resp 20   SpO2 99%   BP Readings from Last 3 Encounters:  07/03/20 (!) 170/82  01/17/20 (!) 156/74  09/21/19 120/70        Begin trial of: Meds ordered this encounter  Medications  . predniSONE (DELTASONE) 20 MG tablet    Sig: Take 2 tablets (40 mg total) by mouth daily.    Dispense:  10 tablet    Refill:  0     Follow-up Information    [name], [name], MD.   Specialty: Internal Medicine Why: As needed. Contact information: [place] [address] [phone]               Reviewed expectations re: course of current medical issues. Questions answered. Outlined signs and symptoms indicating need for more acute intervention. Understanding verbalized. After Visit Summary given.   SUBJECTIVE: History from: patient. [name] [name] is a 70 y.o. male who reports pain around "jaw"; specifically around TMJ (L>R); gradual onset; unsure when started; maybe a week; on/off. Normal PO intake without n/v/d. Afebrile. No specific tooth pain. He questions mild otalgia at onset; resolved. No OTC tx.   OBJECTIVE:  Vitals:   07/03/20 1336  BP: (!) 170/82  Pulse: 100   Resp: 20  Temp: 98.3 F (36.8 C)  SpO2: 99%    General appearance: alert; no distress Eyes: PERRLA; EOMI; conjunctiva normal HENT: [place]; AT; without nasal congestion; is tender over L TMJ; normal jaw movement; no overlying erythema Neck: supple  Lungs: speaks full sentences without difficulty; unlabored Extremities: no edema Skin: warm and dry Neurologic: normal gait Psychological: alert and cooperative; normal mood and affect  Allergies  Allergen Reactions  . Other Rash    Oxyquinoline-white Pet-lanolin  . No Known Allergies   . Bag Balm  [Albolene] Rash    Past Medical History:  Diagnosis Date  . Anemia    as a younger man  . Arthritis    hands  . Cataract   . Chronic kidney disease    "starting to bother me now" (07/28/2012)  . Diabetes mellitus     type II  . Diarrhea, functional   . ED (erectile dysfunction)   . Hypertension   . Neuropathy   . Peripheral neuropathy    Social History   Socioeconomic History  . Marital status: Married    Spouse name: Not on file  . Number of children: 3  . Years of education: Not on file  . Highest education level: Associate degree: academic program  Occupational History    Employer: [employer]    Comment: [employer]  Tobacco Use  . Smoking status: Former Smoker    Packs/day: 0.12    Years: 10.00    Pack years: 1.20    Types: Cigarettes    Quit date: 04/27/1986    Years since quitting: 34.2  . Smokeless tobacco: Never Used  . Tobacco comment: 07/28/2012 "quit smoking cigarettes 20-30 yr ago"  Vaping Use  . Vaping Use: Never used  Substance and Sexual Activity  . Alcohol use: Yes    Alcohol/week: 1.0 standard drink    Types: 1 Cans of beer per week    Comment: occ  . Drug use: No  . Sexual activity: Not Currently    Partners: Male  Other Topics Concern  . Not on file  Social History Narrative  . Not on file   Social Determinants of Health   Financial Resource Strain: Not on file  Food  Insecurity: Not on file  Transportation Needs: Not on file  Physical Activity: Not on file  Stress: Not on file  Social Connections: Not on file  Intimate Partner Violence: Not on file   Family History  Problem Relation Age of Onset  . Colon polyps Mother   . Stroke Mother   . Diabetes Father   . Diabetes Cousin   . Prostate cancer Brother   . Other Daughter        kidney problems  . Esophageal cancer Maternal Aunt   . Colon cancer Neg Hx   . Stomach cancer Neg Hx   . Rectal cancer Neg Hx    Past Surgical History:  Procedure Laterality Date  . APPENDECTOMY  1950's  . AV FISTULA PLACEMENT Right 05/14/2016   Procedure: ARTERIOVENOUS (AV) FISTULA CREATION- RIGHT ARM;  Surgeon: Serafina Mitchell, MD;  Location: [place];  Service: Vascular;  Laterality: Right;  . COLONOSCOPY    . KIDNEY TRANSPLANT Right   . PROSTATE SURGERY    . UPPER GASTROINTESTINAL ENDOSCOPY       Vanessa Kick, MD 07/04/20 [phone]

## 2020-07-05 ENCOUNTER — Emergency Department (HOSPITAL_COMMUNITY): Payer: Medicare Other

## 2020-07-05 ENCOUNTER — Other Ambulatory Visit: Payer: Self-pay

## 2020-07-05 ENCOUNTER — Inpatient Hospital Stay (HOSPITAL_COMMUNITY)
Admission: EM | Admit: 2020-07-05 | Discharge: 2020-07-07 | DRG: 689 | Disposition: A | Discharge status: Other Acute Inpatient Hospital | Payer: Medicare Other | Attending: Internal Medicine | Admitting: Internal Medicine

## 2020-07-05 ENCOUNTER — Encounter (HOSPITAL_COMMUNITY): Payer: Self-pay

## 2020-07-05 DIAGNOSIS — Z79899 Other long term (current) drug therapy: Secondary | ICD-10-CM

## 2020-07-05 DIAGNOSIS — N179 Acute kidney failure, unspecified: Principal | ICD-10-CM

## 2020-07-05 DIAGNOSIS — M19041 Primary osteoarthritis, right hand: Secondary | ICD-10-CM | POA: Diagnosis present

## 2020-07-05 DIAGNOSIS — I160 Hypertensive urgency: Secondary | ICD-10-CM | POA: Diagnosis present

## 2020-07-05 DIAGNOSIS — G9341 Metabolic encephalopathy: Secondary | ICD-10-CM | POA: Diagnosis present

## 2020-07-05 DIAGNOSIS — Z87891 Personal history of nicotine dependence: Secondary | ICD-10-CM

## 2020-07-05 DIAGNOSIS — E871 Hypo-osmolality and hyponatremia: Secondary | ICD-10-CM | POA: Diagnosis present

## 2020-07-05 DIAGNOSIS — Z833 Family history of diabetes mellitus: Secondary | ICD-10-CM

## 2020-07-05 DIAGNOSIS — M19042 Primary osteoarthritis, left hand: Secondary | ICD-10-CM | POA: Diagnosis present

## 2020-07-05 DIAGNOSIS — R4182 Altered mental status, unspecified: Secondary | ICD-10-CM | POA: Diagnosis present

## 2020-07-05 DIAGNOSIS — I129 Hypertensive chronic kidney disease with stage 1 through stage 4 chronic kidney disease, or unspecified chronic kidney disease: Secondary | ICD-10-CM | POA: Diagnosis present

## 2020-07-05 DIAGNOSIS — E878 Other disorders of electrolyte and fluid balance, not elsewhere classified: Secondary | ICD-10-CM | POA: Diagnosis present

## 2020-07-05 DIAGNOSIS — N390 Urinary tract infection, site not specified: Principal | ICD-10-CM | POA: Diagnosis present

## 2020-07-05 DIAGNOSIS — Z20822 Contact with and (suspected) exposure to covid-19: Secondary | ICD-10-CM | POA: Diagnosis present

## 2020-07-05 DIAGNOSIS — Z9109 Other allergy status, other than to drugs and biological substances: Secondary | ICD-10-CM

## 2020-07-05 DIAGNOSIS — E1122 Type 2 diabetes mellitus with diabetic chronic kidney disease: Secondary | ICD-10-CM | POA: Diagnosis present

## 2020-07-05 DIAGNOSIS — Z794 Long term (current) use of insulin: Secondary | ICD-10-CM

## 2020-07-05 DIAGNOSIS — T8619 Other complication of kidney transplant: Secondary | ICD-10-CM | POA: Diagnosis present

## 2020-07-05 DIAGNOSIS — Z7982 Long term (current) use of aspirin: Secondary | ICD-10-CM

## 2020-07-05 DIAGNOSIS — Z7952 Long term (current) use of systemic steroids: Secondary | ICD-10-CM

## 2020-07-05 DIAGNOSIS — N1832 Chronic kidney disease, stage 3b: Secondary | ICD-10-CM | POA: Diagnosis present

## 2020-07-05 DIAGNOSIS — E1142 Type 2 diabetes mellitus with diabetic polyneuropathy: Secondary | ICD-10-CM | POA: Diagnosis present

## 2020-07-05 DIAGNOSIS — Y830 Surgical operation with transplant of whole organ as the cause of abnormal reaction of the patient, or of later complication, without mention of misadventure at the time of the procedure: Secondary | ICD-10-CM | POA: Diagnosis present

## 2020-07-05 LAB — BASIC METABOLIC PANEL
Anion gap: 12 (ref 5–15)
BUN: 46 mg/dL — ABNORMAL HIGH (ref 8–23)
CO2: 19 mmol/L — ABNORMAL LOW (ref 22–32)
Calcium: 10.2 mg/dL (ref 8.9–10.3)
Chloride: 94 mmol/L — ABNORMAL LOW (ref 98–111)
Creatinine, Ser: 2.46 mg/dL — ABNORMAL HIGH (ref 0.61–1.24)
GFR, Estimated: 27 mL/min — ABNORMAL LOW (ref >60)
Glucose, Bld: 169 mg/dL — ABNORMAL HIGH (ref 70–99)
Potassium: 4.9 mmol/L (ref 3.5–5.1)
Sodium: 125 mmol/L — ABNORMAL LOW (ref 135–145)

## 2020-07-05 LAB — CBG MONITORING, ED: Glucose-Capillary: 171 mg/dL — ABNORMAL HIGH (ref 70–99)

## 2020-07-05 LAB — CBC
HCT: 22.9 % — ABNORMAL LOW (ref 39.0–52.0)
Hemoglobin: 7.9 g/dL — ABNORMAL LOW (ref 13.0–17.0)
MCH: 30.4 pg (ref 26.0–34.0)
MCHC: 34.5 g/dL (ref 30.0–36.0)
MCV: 88.1 fL (ref 80.0–100.0)
Platelets: 278 10*3/uL (ref 150–400)
RBC: 2.6 MIL/uL — ABNORMAL LOW (ref 4.22–5.81)
RDW: 13.2 % (ref 11.5–15.5)
WBC: 7.9 10*3/uL (ref 4.0–10.5)
nRBC: 0 % (ref 0.0–0.2)

## 2020-07-05 LAB — URINALYSIS, ROUTINE W REFLEX MICROSCOPIC
Bilirubin Urine: NEGATIVE
Glucose, UA: NEGATIVE mg/dL
Ketones, ur: 5 mg/dL — AB
Nitrite: NEGATIVE
Protein, ur: 30 mg/dL — AB
Specific Gravity, Urine: 1.014 (ref 1.005–1.030)
WBC, UA: >50 WBC/hpf — ABNORMAL HIGH (ref 0–5)
pH: 5 (ref 5.0–8.0)

## 2020-07-05 LAB — TROPONIN I (HIGH SENSITIVITY): Troponin I (High Sensitivity): 16 ng/L (ref <18)

## 2020-07-05 MED ORDER — SODIUM CHLORIDE 0.9 % IV BOLUS
1000.0000 mL | Freq: Once | INTRAVENOUS | Status: AC
  Administered 2020-07-06: 1000 mL via INTRAVENOUS

## 2020-07-05 MED ORDER — SODIUM CHLORIDE 0.9 % IV SOLN
2.0000 g | Freq: Once | INTRAVENOUS | Status: AC
  Administered 2020-07-06: 2 g via INTRAVENOUS
  Filled 2020-07-05: qty 20

## 2020-07-05 NOTE — ED Triage Notes (Signed)
Patient coming from home with daughter, she reports he has not been feeling well all week, was seen at [facility] on Tuesday and was given prednisone, has not had taken meds in two days, unable to walk due to weakness, hx of sepsis from UTI, [facility] transplant patient.

## 2020-07-06 DIAGNOSIS — E118 Type 2 diabetes mellitus with unspecified complications: Secondary | ICD-10-CM

## 2020-07-06 DIAGNOSIS — R400 Somnolence: Secondary | ICD-10-CM | POA: Diagnosis not present

## 2020-07-06 DIAGNOSIS — M19041 Primary osteoarthritis, right hand: Secondary | ICD-10-CM | POA: Diagnosis present

## 2020-07-06 DIAGNOSIS — Y830 Surgical operation with transplant of whole organ as the cause of abnormal reaction of the patient, or of later complication, without mention of misadventure at the time of the procedure: Secondary | ICD-10-CM | POA: Diagnosis present

## 2020-07-06 DIAGNOSIS — E871 Hypo-osmolality and hyponatremia: Secondary | ICD-10-CM | POA: Diagnosis present

## 2020-07-06 DIAGNOSIS — E1142 Type 2 diabetes mellitus with diabetic polyneuropathy: Secondary | ICD-10-CM | POA: Diagnosis present

## 2020-07-06 DIAGNOSIS — Z833 Family history of diabetes mellitus: Secondary | ICD-10-CM | POA: Diagnosis not present

## 2020-07-06 DIAGNOSIS — Z20822 Contact with and (suspected) exposure to covid-19: Secondary | ICD-10-CM | POA: Diagnosis present

## 2020-07-06 DIAGNOSIS — E878 Other disorders of electrolyte and fluid balance, not elsewhere classified: Secondary | ICD-10-CM | POA: Diagnosis present

## 2020-07-06 DIAGNOSIS — Z794 Long term (current) use of insulin: Secondary | ICD-10-CM | POA: Diagnosis not present

## 2020-07-06 DIAGNOSIS — Z79899 Other long term (current) drug therapy: Secondary | ICD-10-CM | POA: Diagnosis not present

## 2020-07-06 DIAGNOSIS — M19042 Primary osteoarthritis, left hand: Secondary | ICD-10-CM | POA: Diagnosis present

## 2020-07-06 DIAGNOSIS — E1122 Type 2 diabetes mellitus with diabetic chronic kidney disease: Secondary | ICD-10-CM | POA: Diagnosis present

## 2020-07-06 DIAGNOSIS — Z940 Kidney transplant status: Secondary | ICD-10-CM

## 2020-07-06 DIAGNOSIS — R4182 Altered mental status, unspecified: Secondary | ICD-10-CM | POA: Diagnosis present

## 2020-07-06 DIAGNOSIS — E0821 Diabetes mellitus due to underlying condition with diabetic nephropathy: Secondary | ICD-10-CM

## 2020-07-06 DIAGNOSIS — I160 Hypertensive urgency: Secondary | ICD-10-CM | POA: Diagnosis present

## 2020-07-06 DIAGNOSIS — E1165 Type 2 diabetes mellitus with hyperglycemia: Secondary | ICD-10-CM

## 2020-07-06 DIAGNOSIS — N179 Acute kidney failure, unspecified: Secondary | ICD-10-CM | POA: Diagnosis present

## 2020-07-06 DIAGNOSIS — I129 Hypertensive chronic kidney disease with stage 1 through stage 4 chronic kidney disease, or unspecified chronic kidney disease: Secondary | ICD-10-CM | POA: Diagnosis present

## 2020-07-06 DIAGNOSIS — N390 Urinary tract infection, site not specified: Principal | ICD-10-CM

## 2020-07-06 DIAGNOSIS — Z87891 Personal history of nicotine dependence: Secondary | ICD-10-CM | POA: Diagnosis not present

## 2020-07-06 DIAGNOSIS — T8619 Other complication of kidney transplant: Secondary | ICD-10-CM | POA: Diagnosis present

## 2020-07-06 DIAGNOSIS — Z7952 Long term (current) use of systemic steroids: Secondary | ICD-10-CM | POA: Diagnosis not present

## 2020-07-06 DIAGNOSIS — G9341 Metabolic encephalopathy: Secondary | ICD-10-CM | POA: Diagnosis present

## 2020-07-06 DIAGNOSIS — Z7982 Long term (current) use of aspirin: Secondary | ICD-10-CM | POA: Diagnosis not present

## 2020-07-06 DIAGNOSIS — N1832 Chronic kidney disease, stage 3b: Secondary | ICD-10-CM

## 2020-07-06 DIAGNOSIS — Z9109 Other allergy status, other than to drugs and biological substances: Secondary | ICD-10-CM | POA: Diagnosis not present

## 2020-07-06 LAB — GLUCOSE, CAPILLARY: Glucose-Capillary: 148 mg/dL — ABNORMAL HIGH (ref 70–99)

## 2020-07-06 LAB — BASIC METABOLIC PANEL
Anion gap: 15 (ref 5–15)
BUN: 39 mg/dL — ABNORMAL HIGH (ref 8–23)
CO2: 18 mmol/L — ABNORMAL LOW (ref 22–32)
Calcium: 10.2 mg/dL (ref 8.9–10.3)
Chloride: 96 mmol/L — ABNORMAL LOW (ref 98–111)
Creatinine, Ser: 2.21 mg/dL — ABNORMAL HIGH (ref 0.61–1.24)
GFR, Estimated: 31 mL/min — ABNORMAL LOW (ref >60)
Glucose, Bld: 154 mg/dL — ABNORMAL HIGH (ref 70–99)
Potassium: 4.7 mmol/L (ref 3.5–5.1)
Sodium: 129 mmol/L — ABNORMAL LOW (ref 135–145)

## 2020-07-06 LAB — OSMOLALITY: Osmolality: 290 mOsm/kg (ref 275–295)

## 2020-07-06 LAB — CBC
HCT: 22.9 % — ABNORMAL LOW (ref 39.0–52.0)
Hemoglobin: 7.9 g/dL — ABNORMAL LOW (ref 13.0–17.0)
MCH: 30.5 pg (ref 26.0–34.0)
MCHC: 34.5 g/dL (ref 30.0–36.0)
MCV: 88.4 fL (ref 80.0–100.0)
Platelets: 253 10*3/uL (ref 150–400)
RBC: 2.59 MIL/uL — ABNORMAL LOW (ref 4.22–5.81)
RDW: 13.2 % (ref 11.5–15.5)
WBC: 7.7 10*3/uL (ref 4.0–10.5)
nRBC: 0 % (ref 0.0–0.2)

## 2020-07-06 LAB — CBG MONITORING, ED
Glucose-Capillary: 147 mg/dL — ABNORMAL HIGH (ref 70–99)
Glucose-Capillary: 212 mg/dL — ABNORMAL HIGH (ref 70–99)
Glucose-Capillary: 103 mg/dL — ABNORMAL HIGH (ref 70–99)

## 2020-07-06 LAB — LACTIC ACID, PLASMA: Lactic Acid, Venous: 1 mmol/L (ref 0.5–1.9)

## 2020-07-06 LAB — OSMOLALITY, URINE: Osmolality, Ur: 542 mOsm/kg (ref 300–900)

## 2020-07-06 LAB — HEMOGLOBIN A1C
Hgb A1c MFr Bld: 9.8 % — ABNORMAL HIGH (ref 4.8–5.6)
Mean Plasma Glucose: 234.56 mg/dL

## 2020-07-06 LAB — RESP PANEL BY RT-PCR (FLU A&B, COVID) ARPGX2
Influenza A by PCR: NEGATIVE
Influenza B by PCR: NEGATIVE
SARS Coronavirus 2 by RT PCR: NEGATIVE

## 2020-07-06 LAB — TSH: TSH: 0.385 u[IU]/mL (ref 0.350–4.500)

## 2020-07-06 LAB — TROPONIN I (HIGH SENSITIVITY): Troponin I (High Sensitivity): 19 ng/L — ABNORMAL HIGH (ref <18)

## 2020-07-06 MED ORDER — AMLODIPINE BESYLATE 5 MG PO TABS
2.5000 mg | ORAL_TABLET | Freq: Every day | ORAL | Status: DC
  Administered 2020-07-06: 2.5 mg via ORAL
  Filled 2020-07-06: qty 1

## 2020-07-06 MED ORDER — OXYBUTYNIN CHLORIDE ER 5 MG PO TB24
5.0000 mg | ORAL_TABLET | Freq: Every day | ORAL | Status: DC
  Administered 2020-07-06 – 2020-07-07 (×2): 5 mg via ORAL
  Filled 2020-07-06 (×3): qty 1

## 2020-07-06 MED ORDER — VALGANCICLOVIR HCL 450 MG PO TABS
450.0000 mg | ORAL_TABLET | Freq: Every day | ORAL | Status: DC
  Administered 2020-07-06 – 2020-07-07 (×2): 450 mg via ORAL
  Filled 2020-07-06 (×3): qty 1

## 2020-07-06 MED ORDER — ACETAMINOPHEN 650 MG RE SUPP: 650.0000 mg | Freq: Four times a day (QID) | RECTAL | Status: DC | PRN

## 2020-07-06 MED ORDER — MYCOPHENOLATE MOFETIL 250 MG PO CAPS
1000.0000 mg | ORAL_CAPSULE | Freq: Two times a day (BID) | ORAL | Status: DC
  Administered 2020-07-06 – 2020-07-07 (×4): 1000 mg via ORAL
  Filled 2020-07-06 (×8): qty 4

## 2020-07-06 MED ORDER — TACROLIMUS 1 MG PO CAPS
5.0000 mg | ORAL_CAPSULE | Freq: Two times a day (BID) | ORAL | Status: DC
  Administered 2020-07-06 – 2020-07-07 (×4): 5 mg via ORAL
  Filled 2020-07-06 (×6): qty 5

## 2020-07-06 MED ORDER — AMLODIPINE BESYLATE 5 MG PO TABS
5.0000 mg | ORAL_TABLET | Freq: Every day | ORAL | Status: DC
Start: 2020-07-07 — End: 2020-07-07
  Administered 2020-07-07: 5 mg via ORAL
  Filled 2020-07-06: qty 1

## 2020-07-06 MED ORDER — PRAVASTATIN SODIUM 10 MG PO TABS
10.0000 mg | ORAL_TABLET | Freq: Every day | ORAL | Status: DC
  Administered 2020-07-06: 10 mg via ORAL
  Filled 2020-07-06: qty 1

## 2020-07-06 MED ORDER — PREDNISONE 5 MG PO TABS
5.0000 mg | ORAL_TABLET | Freq: Every day | ORAL | Status: DC
  Administered 2020-07-06 – 2020-07-07 (×2): 5 mg via ORAL
  Filled 2020-07-06 (×5): qty 1

## 2020-07-06 MED ORDER — INSULIN ASPART 100 UNIT/ML ~~LOC~~ SOLN
0.0000 [IU] | Freq: Three times a day (TID) | SUBCUTANEOUS | Status: DC
  Administered 2020-07-06: 5 [IU] via SUBCUTANEOUS
  Administered 2020-07-06 (×2): 2 [IU] via SUBCUTANEOUS
  Administered 2020-07-07: 3 [IU] via SUBCUTANEOUS

## 2020-07-06 MED ORDER — TRAZODONE HCL 50 MG PO TABS: 25.0000 mg | ORAL_TABLET | Freq: Every evening | ORAL | Status: DC | PRN

## 2020-07-06 MED ORDER — INSULIN GLARGINE 100 UNIT/ML ~~LOC~~ SOLN
10.0000 [IU] | Freq: Every day | SUBCUTANEOUS | Status: DC
  Administered 2020-07-06 – 2020-07-07 (×2): 10 [IU] via SUBCUTANEOUS
  Filled 2020-07-06 (×4): qty 0.1

## 2020-07-06 MED ORDER — SODIUM CHLORIDE 0.9 % IV SOLN
1.0000 g | INTRAVENOUS | Status: DC
  Administered 2020-07-07: 1 g via INTRAVENOUS
  Filled 2020-07-06: qty 10
  Filled 2020-07-06: qty 1

## 2020-07-06 MED ORDER — AMLODIPINE BESYLATE 5 MG PO TABS
2.5000 mg | ORAL_TABLET | Freq: Once | ORAL | Status: AC
  Administered 2020-07-06: 2.5 mg via ORAL
  Filled 2020-07-06: qty 1

## 2020-07-06 MED ORDER — SODIUM CHLORIDE 0.9 % IV SOLN: INTRAVENOUS | Status: DC

## 2020-07-06 MED ORDER — ENOXAPARIN SODIUM 30 MG/0.3ML ~~LOC~~ SOLN
30.0000 mg | SUBCUTANEOUS | Status: DC
  Administered 2020-07-06 – 2020-07-07 (×2): 30 mg via SUBCUTANEOUS
  Filled 2020-07-06 (×2): qty 0.3

## 2020-07-06 MED ORDER — ONDANSETRON HCL 4 MG PO TABS: 4.0000 mg | ORAL_TABLET | Freq: Four times a day (QID) | ORAL | Status: DC | PRN

## 2020-07-06 MED ORDER — ALBUTEROL SULFATE HFA 108 (90 BASE) MCG/ACT IN AERS: 2.0000 | INHALATION_SPRAY | Freq: Four times a day (QID) | RESPIRATORY_TRACT | Status: DC | PRN

## 2020-07-06 MED ORDER — LABETALOL HCL 5 MG/ML IV SOLN: 20.0000 mg | INTRAVENOUS | Status: DC | PRN

## 2020-07-06 MED ORDER — MAGNESIUM HYDROXIDE 400 MG/5ML PO SUSP
30.0000 mL | Freq: Every day | ORAL | Status: DC | PRN
  Filled 2020-07-06 (×2): qty 30

## 2020-07-06 MED ORDER — ONDANSETRON HCL 4 MG/2ML IJ SOLN: 4.0000 mg | Freq: Four times a day (QID) | INTRAMUSCULAR | Status: DC | PRN

## 2020-07-06 MED ORDER — ACETAMINOPHEN 325 MG PO TABS
650.0000 mg | ORAL_TABLET | Freq: Four times a day (QID) | ORAL | Status: DC | PRN
  Administered 2020-07-06: 650 mg via ORAL
  Filled 2020-07-06: qty 2

## 2020-07-06 MED ORDER — ASPIRIN 81 MG PO CHEW
81.0000 mg | CHEWABLE_TABLET | Freq: Every day | ORAL | Status: DC
  Administered 2020-07-06 – 2020-07-07 (×2): 81 mg via ORAL
  Filled 2020-07-06 (×2): qty 1

## 2020-07-06 NOTE — ED Notes (Signed)
Attempted to call report. No answer.

## 2020-07-06 NOTE — ED Notes (Signed)
Attempted to call report. Zella Ball, RN advised that she would have nurse to call for report.

## 2020-07-06 NOTE — ED Notes (Signed)
Pt eating meal tray at this time

## 2020-07-06 NOTE — ED Provider Notes (Signed)
[facility] Emergency Department Provider Note MRN:  [id number]  Arrival date & time: 07/06/20     Chief Complaint   Weakness   History of Present Illness   [name] [name] is a 70 y.o. year-old male with a history of kidney transplant presenting to the ED with chief complaint of weakness.  Worsening confusion at home, not dressing himself properly, not able to take his home medications.  Brought in by daughter, who explains that patient has had issues with sepsis in the past and he acted similar to this prior to getting very sick.  Not eating or drinking well.  Patient is confused and has no complaints.  I was unable to obtain an accurate HPI, PMH, or ROS due to the patient's altered mental status.  Level 5 caveat.  Review of Systems  Positive for weakness, confusion, poor p.o. intake.  Patient's Health History    Past Medical History:  Diagnosis Date  . Anemia    as a younger man  . Arthritis    hands  . Cataract   . Chronic kidney disease    "starting to bother me now" (07/28/2012)  . Diabetes mellitus     type II  . Diarrhea, functional   . ED (erectile dysfunction)   . Hypertension   . Neuropathy   . Peripheral neuropathy     Past Surgical History:  Procedure Laterality Date  . APPENDECTOMY  1950's  . AV FISTULA PLACEMENT Right 05/14/2016   Procedure: ARTERIOVENOUS (AV) FISTULA CREATION- RIGHT ARM;  Surgeon: Serafina Mitchell, MD;  Location: [place];  Service: Vascular;  Laterality: Right;  . COLONOSCOPY    . KIDNEY TRANSPLANT Right   . PROSTATE SURGERY    . UPPER GASTROINTESTINAL ENDOSCOPY      Family History  Problem Relation Age of Onset  . Colon polyps Mother   . Stroke Mother   . Diabetes Father   . Diabetes Cousin   . Prostate cancer Brother   . Other Daughter        kidney problems  . Esophageal cancer Maternal Aunt   . Colon cancer Neg Hx   . Stomach cancer Neg Hx   . Rectal cancer Neg Hx     Social History    Socioeconomic History  . Marital status: Married    Spouse name: Not on file  . Number of children: 3  . Years of education: Not on file  . Highest education level: Associate degree: academic program  Occupational History    Employer: [employer]    Comment: [employer]  Tobacco Use  . Smoking status: Former Smoker    Packs/day: 0.12    Years: 10.00    Pack years: 1.20    Types: Cigarettes    Quit date: 04/27/1986    Years since quitting: 34.2  . Smokeless tobacco: Never Used  . Tobacco comment: 07/28/2012 "quit smoking cigarettes 20-30 yr ago"  Vaping Use  . Vaping Use: Never used  Substance and Sexual Activity  . Alcohol use: Yes    Alcohol/week: 1.0 standard drink    Types: 1 Cans of beer per week    Comment: occ  . Drug use: No  . Sexual activity: Not Currently    Partners: Male  Other Topics Concern  . Not on file  Social History Narrative  . Not on file   Social Determinants of Health   Financial Resource Strain: Not on file  Food Insecurity: Not on  file  Transportation Needs: Not on file  Physical Activity: Not on file  Stress: Not on file  Social Connections: Not on file  Intimate Partner Violence: Not on file     Physical Exam   Vitals:   07/05/20 2330 07/05/20 2345  BP: (!) 194/73 (!) 177/74  Pulse: 89 91  Resp: 17 12  Temp:    SpO2:  100%    CONSTITUTIONAL: Well-appearing, NAD NEURO: Awake, alert, oriented to name, follows commands, moves all extremities EYES:  eyes equal and reactive ENT/NECK:  no LAD, no JVD CARDIO: Regular rate, well-perfused, normal S1 and S2 PULM:  CTAB no wheezing or rhonchi GI/GU:  normal bowel sounds, non-distended, non-tender MSK/SPINE:  No gross deformities, no edema SKIN:  no rash, atraumatic PSYCH:  Appropriate speech and behavior  *Additional and/or pertinent findings included in MDM below  Diagnostic and Interventional Summary    EKG Interpretation  Date/Time:  Friday July 05 2020  22:17:06 EST Ventricular Rate:  105 PR Interval:  120 QRS Duration: 70 QT Interval:  310 QTC Calculation: 409 R Axis:   72 Text Interpretation: Sinus tachycardia Left ventricular hypertrophy with repolarization abnormality ( Sokolow-Lyon , Romhilt-Estes ) Abnormal ECG Confirmed by Gerlene Fee [phone]) on 07/06/2020 1:02:40 AM      Labs Reviewed  BASIC METABOLIC PANEL - Abnormal; Notable for the following components:      Result Value   Sodium 125 (*)    Chloride 94 (*)    CO2 19 (*)    Glucose, Bld 169 (*)    BUN 46 (*)    Creatinine, Ser 2.46 (*)    GFR, Estimated 27 (*)    All other components within normal limits  CBC - Abnormal; Notable for the following components:   RBC 2.60 (*)    Hemoglobin 7.9 (*)    HCT 22.9 (*)    All other components within normal limits  URINALYSIS, ROUTINE W REFLEX MICROSCOPIC - Abnormal; Notable for the following components:   APPearance HAZY (*)    Hgb urine dipstick SMALL (*)    Ketones, ur 5 (*)    Protein, ur 30 (*)    Leukocytes,Ua LARGE (*)    WBC, UA >50 (*)    Bacteria, UA MANY (*)    All other components within normal limits  CBG MONITORING, ED - Abnormal; Notable for the following components:   Glucose-Capillary 171 (*)    All other components within normal limits  CBG MONITORING, ED  TROPONIN I (HIGH SENSITIVITY)  TROPONIN I (HIGH SENSITIVITY)    CT HEAD WO CONTRAST  Final Result      Medications  cefTRIAXone (ROCEPHIN) 2 g in sodium chloride 0.9 % 100 mL IVPB (2 g Intravenous New Bag/Given 07/06/20 0059)  sodium chloride 0.9 % bolus 1,000 mL (1,000 mLs Intravenous New Bag/Given 07/06/20 0058)     Procedures  /  Critical Care Procedures  ED Course and Medical Decision Making  I have reviewed the triage vital signs, the nursing notes, and pertinent available records from the EMR.  Listed above are laboratory and imaging tests that I personally ordered, reviewed, and interpreted and then considered in my medical decision  making (see below for details).  Kidney transplant patient, confused, not able to take meds, labs revealing UTI, mild AKI, hyponatremia.  History obtained from daughter, who explains that patient typically follows at [facility] given his kidney transplant history.  Spoke with [facility] transfer center, no beds available at this time, likely no beds  for the next day or 2.  Will discuss with hospitalist service for admission here.       Barth Kirks. Sedonia Small, MD [place] [email or web address]  Final Clinical Impressions(s) / ED Diagnoses     ICD-10-CM   1. AKI (acute kidney injury) ([place])  N17.9   2. Hyponatremia  E87.1   3. Lower urinary tract infectious disease  N39.0     ED Discharge Orders    None       Discharge Instructions Discussed with and Provided to Patient:   Discharge Instructions   None       Maudie Flakes, MD 07/06/20 [phone]

## 2020-07-06 NOTE — ED Notes (Signed)
24 hour urine bottle sent with patient to floor.

## 2020-07-06 NOTE — Progress Notes (Signed)
PROGRESS NOTE    [name] [name]  ERD:[id number] DOB: 1951-02-25 DOA: 07/05/2020 PCP: [name], [name], MD     Brief Narrative:  [name] [name] is a 70 y.o. BM PMHx DM type II uncontrolled with DM peripheral neuropathy, HTN, CKDIIIb, s/p Renal transplant on immunosuppressive therapy, anemia  Presented to the emergency room with acute onset of altered mental status with confusion as well as generalized weakness. He has been noticed to not being able to dressing appropriately yesterday. His symptoms have been worsening over the last couple of days. He admits to urinary frequency and urgency with no dysuria or hematuria or flank pain. No nausea or vomiting or abdominal pain. Is a fairly poor historian due to altered mental status. No chest pain or palpitations were reported. No reported cough or wheezing good dyspnea. ED Course: Blood pressure was 183/66 and later 194/73 when he came to the ER with otherwise normal vital signs. Labs revealed hyponatremia hypochloremia and a glucose of 179 with a BUN of 46 and creatinine of 2.46 up from previous levels with high sensitive point of 16 and CBC showed hemoglobin of 7.9 and hematocrit 22.9 compared to 9.8/32.2 on 01/16/2020.  EKG as reviewed by me : Showed normal sinus rhythm with a rate of 100 with minimal voltage criteria for LVH with peaked T waves and early repolarization in V3 through V5 Imaging: Noncontrasted head CT scan revealed generalized cerebral atrophy with no acute intracranial normalities.  The patient was given 2 g of IV Rocephin and 1 L bolus of IV normal saline. He will be admitted to a medical monitored bed for further evaluation and management   Subjective: A/O x3 (does not know why).  Patient somewhat confused but cooperative   Assessment & Plan: Covid vaccination; 1/3 vaccination   Active Problems:   AMS (altered mental status)  Acute metabolic encephalopathy -Multifactorial to include UTI, hypertensive  urgency, hyponatremia -Treat underlying cause  Acute on CKD stageIIIb (baseline Cr 1.88)/Renal transplant patient Lab Results  Component Value Date   CREATININE 2.21 (H) 07/06/2020   CREATININE 2.46 (H) 07/05/2020   CREATININE 1.88 (H) 01/16/2020   CREATININE 10.57 (H) 06/16/2019   CREATININE 11.58 (H) 06/15/2019  -Hydrate normal saline 161ml/hr -Strict in and out -Daily weight -2/20 inform nephrology patient has been admitted; renal transplant patient -Mycophenolate 1000 mg BID -Prednisone 5 mg daily -Tacrolimus 5 mg BID -Valganciclovir 450 mg daily -2/19 Tacrolimus level pending  Hypertensive urgency -2/19 increase Amlodipine 5 mg daily -Labetalol IV PRN   Hyponatremia -Should correct with hydration. 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  VENTILATOR SETTINGS:    Cultures 2/19 SARS coronavirus negative 2/19 influenza A/B negative  Antimicrobials: Anti-infectives (From admission, onward)   Start     Dose/Rate Route Frequency Ordered Stop   07/06/20 2200  cefTRIAXone (ROCEPHIN) 1 g in sodium chloride 0.9 % 100 mL IVPB        1 g 200 mL/hr over 30 Minutes Intravenous Every 24 hours 07/06/20 0312     07/06/20 1130   valGANciclovir (VALCYTE) 450 MG tablet TABS 450 mg        450 mg Oral Daily 07/06/20 0309     07/05/20 2345  cefTRIAXone (ROCEPHIN) 2 g in sodium chloride 0.9 % 100 mL IVPB        2 g 200 mL/hr over 30 Minutes Intravenous  Once 07/05/20 2342 07/06/20 0138       Devices    LINES / TUBES:      Continuous Infusions: . sodium chloride 100 mL/hr at 07/06/20 0818  . cefTRIAXone (ROCEPHIN)  IV       Objective: Vitals:   07/06/20 0600 07/06/20 0630 07/06/20 0812 07/06/20 1040  BP: (!) 185/82 (!) 160/67 (!) 148/67 126/69  Pulse: 90 88 93   Resp: 13 14 15    Temp:   98.7 F (37.1 C)   TempSrc:   Oral   SpO2: 100% 98% 100%   Weight:      Height:       No intake or output data in the 24 hours ending 07/06/20 1057 Filed Weights   07/05/20 2208  Weight: 69.4 kg    Examination:  General: A/O x3 (does not know why) No acute respiratory distress, cachectic Eyes: negative scleral hemorrhage, negative anisocoria, negative icterus ENT: Negative Runny nose, negative gingival bleeding, Neck:  Negative scars, masses, torticollis, lymphadenopathy, JVD Lungs: Clear to auscultation bilaterally without wheezes or crackles Cardiovascular: Regular rate and rhythm without murmur gallop or rub normal S1 and S2 Abdomen: negative abdominal pain, nondistended, positive soft, bowel sounds, no rebound, no ascites, no appreciable mass Extremities: No significant cyanosis, clubbing, or edema bilateral lower extremities Skin: Negative rashes, lesions, ulcers Psychiatric:  Negative depression, negative anxiety, negative fatigue, negative mania  Central nervous system:  Cranial nerves II through XII intact, tongue/uvula midline, all extremities muscle strength 5/5, sensation intact throughout, negative dysarthria, negative expressive aphasia, negative receptive aphasia.  .     Data Reviewed: Care during the described time interval was provided by me .  I have reviewed this patient's available data,  including medical history, events of note, physical examination, and all test results as part of my evaluation.  CBC: Recent Labs  Lab 07/05/20 2222 07/06/20 0328  WBC 7.9 7.7  HGB 7.9* 7.9*  HCT 22.9* 22.9*  MCV 88.1 88.4  PLT 278 409   Basic Metabolic Panel: Recent Labs  Lab 07/05/20 2222 07/06/20 0328  NA 125* 129*  K 4.9 4.7  CL 94* 96*  CO2 19* 18*  GLUCOSE 169* 154*  BUN 46* 39*  CREATININE 2.46* 2.21*  CALCIUM 10.2 10.2   GFR: Estimated Creatinine Clearance: 29.7 mL/min (A) (by C-G formula based on SCr of 2.21 mg/dL (H)). Liver Function Tests: No results for input(s): AST, ALT, ALKPHOS, BILITOT, PROT, ALBUMIN in the last 168 hours. No results for input(s): LIPASE, AMYLASE in the last 168 hours. No results for input(s): AMMONIA in the last 168 hours. Coagulation Profile: No results for input(s): INR,  PROTIME in the last 168 hours. Cardiac Enzymes: No results for input(s): CKTOTAL, CKMB, CKMBINDEX, TROPONINI in the last 168 hours. BNP (last 3 results) No results for input(s): PROBNP in the last 8760 hours. HbA1C: Recent Labs    07/06/20 0459  HGBA1C 9.8*   CBG: Recent Labs  Lab 07/05/20 2207 07/06/20 0750  GLUCAP 171* 147*   Lipid Profile: No results for input(s): CHOL, HDL, LDLCALC, TRIG, CHOLHDL, LDLDIRECT in the last 72 hours. Thyroid Function Tests: Recent Labs    07/06/20 0459  TSH 0.385   Anemia Panel: No results for input(s): VITAMINB12, FOLATE, FERRITIN, TIBC, IRON, RETICCTPCT in the last 72 hours. Sepsis Labs: No results for input(s): PROCALCITON, LATICACIDVEN in the last 168 hours.  No results found for this or any previous visit (from the past 240 hour(s)).       Radiology Studies: CT HEAD WO CONTRAST  Result Date: 07/06/2020 CLINICAL DATA:  Delirium. EXAM: CT HEAD WITHOUT CONTRAST TECHNIQUE: Contiguous axial images were obtained from the base of the skull through the vertex without intravenous contrast. COMPARISON:  June 10, 2019 FINDINGS: Brain: There is mild cerebral atrophy with widening of the extra-axial spaces and ventricular dilatation. There are areas of decreased attenuation within the white matter tracts of the supratentorial brain, consistent with microvascular disease changes. Vascular: No hyperdense vessel or unexpected calcification. Skull: Normal. Negative for fracture or focal lesion. Sinuses/Orbits: No acute finding. Other: None. IMPRESSION: 1. Generalized cerebral atrophy. 2. No acute intracranial abnormality. Electronically Signed   By: Virgina Norfolk M.D.   On: 07/06/2020 00:12        Scheduled Meds: . amLODipine  2.5 mg Oral Daily  . aspirin  81 mg Oral Daily  . enoxaparin (LOVENOX) injection  30 mg Subcutaneous Q24H  . insulin aspart  0-15 Units Subcutaneous TID PC & HS  . insulin glargine  10 Units Subcutaneous Q lunch  . mycophenolate  1,000 mg Oral BID  . oxybutynin  5 mg Oral Daily  . pravastatin  10 mg Oral QHS  . predniSONE  5 mg Oral Q breakfast  . tacrolimus  5 mg Oral BID  . valGANciclovir  450 mg Oral Daily   Continuous Infusions: . sodium chloride 100 mL/hr at 07/06/20 0818  . cefTRIAXone (ROCEPHIN)  IV       LOS: 0 days    Time spent:40 min    Colin Norment, Geraldo Docker, MD [facility]   If 7PM-7AM, please contact night-coverage 07/06/2020, 10:57 AM

## 2020-07-06 NOTE — ED Notes (Signed)
PrimoFit in place. Suction cannister marked with need for 24H urine specimen.

## 2020-07-06 NOTE — ED Notes (Signed)
Called lab, they are to add on urine osmolality.

## 2020-07-06 NOTE — ED Notes (Signed)
Tele  Breakfast Ordered 

## 2020-07-06 NOTE — H&P (Signed)
[place]   PATIENT NAME: [name] [name]    MR#:  [id number]  DATE OF BIRTH:  [birth year]  DATE OF ADMISSION:  07/05/2020  PRIMARY CARE PHYSICIAN: [name], [name], MD   Patient is coming from: Home REQUESTING/REFERRING PHYSICIAN:Bero, Barth Kirks, MD  CHIEF COMPLAINT:   Chief Complaint  Patient presents with  . Weakness  Confusion  HISTORY OF PRESENT ILLNESS:  [name] [name] is a 70 y.o. African-American male with medical history significant for type 2 diabetes mellitus, hypertension, peripheral neuropathy, chronic kidney disease, status post renal transplant on immunosuppressive therapy, who presented to the emergency room with acute onset of altered mental status with confusion as well as generalized weakness. He has been noticed to not being able to dressing appropriately yesterday. His symptoms have been worsening over the last couple of days. He admits to urinary frequency and urgency with no dysuria or hematuria or flank pain. No nausea or vomiting or abdominal pain. Is a fairly poor historian due to altered mental status. No chest pain or palpitations were reported. No reported cough or wheezing good dyspnea. ED Course: Blood pressure was 183/66 and later 194/73 when he came to the ER with otherwise normal vital signs. Labs revealed hyponatremia hypochloremia and a glucose of 179 with a BUN of 46 and creatinine of 2.46 up from previous levels with high sensitive point of 16 and CBC showed hemoglobin of 7.9 and hematocrit 22.9 compared to 9.8/32.2 on 01/16/2020.  EKG as reviewed by me : Showed normal sinus rhythm with a rate of 100 with minimal voltage criteria for LVH with peaked T waves and early repolarization in V3 through V5 Imaging: Noncontrasted head CT scan revealed generalized cerebral atrophy with no acute intracranial normalities.  The patient was given 2 g of IV Rocephin and 1 L bolus of IV normal saline. He will be admitted to a medical monitored bed  for further evaluation and management. PAST MEDICAL HISTORY:   Past Medical History:  Diagnosis Date  . Anemia    as a younger man  . Arthritis    hands  . Cataract   . Chronic kidney disease    "starting to bother me now" (07/28/2012)  . Diabetes mellitus     type II  . Diarrhea, functional   . ED (erectile dysfunction)   . Hypertension   . Neuropathy   . Peripheral neuropathy     PAST SURGICAL HISTORY:   Past Surgical History:  Procedure Laterality Date  . APPENDECTOMY  1950's  . AV FISTULA PLACEMENT Right 05/14/2016   Procedure: ARTERIOVENOUS (AV) FISTULA CREATION- RIGHT ARM;  Surgeon: Serafina Mitchell, MD;  Location: [facility];  Service: Vascular;  Laterality: Right;  . COLONOSCOPY    . KIDNEY TRANSPLANT Right   . PROSTATE SURGERY    . UPPER GASTROINTESTINAL ENDOSCOPY      SOCIAL HISTORY:   Social History   Tobacco Use  . Smoking status: Former Smoker    Packs/day: 0.12    Years: 10.00    Pack years: 1.20    Types: Cigarettes    Quit date: 04/27/1986    Years since quitting: 34.2  . Smokeless tobacco: Never Used  . Tobacco comment: 07/28/2012 "quit smoking cigarettes 20-30 yr ago"  Substance Use Topics  . Alcohol use: Yes    Alcohol/week: 1.0 standard drink    Types: 1 Cans of beer per week    Comment: occ    FAMILY HISTORY:   Family  History  Problem Relation Age of Onset  . Colon polyps Mother   . Stroke Mother   . Diabetes Father   . Diabetes Cousin   . Prostate cancer Brother   . Other Daughter        kidney problems  . Esophageal cancer Maternal Aunt   . Colon cancer Neg Hx   . Stomach cancer Neg Hx   . Rectal cancer Neg Hx     DRUG ALLERGIES:   Allergies  Allergen Reactions  . Other Rash    Oxyquinoline-white Pet-lanolin  . No Known Allergies   . Bag Balm  [Albolene] Rash    REVIEW OF SYSTEMS:   As per history of present illness. All pertinent systems were reviewed above. Constitutional, HEENT, cardiovascular, respiratory, GI, GU,  musculoskeletal, neuro, psychiatric, endocrine, integumentary and hematologic systems were reviewed and are otherwise negative/unremarkable except for positive findings mentioned above in the HPI.  Please note the patient is a fairly poor historian. MEDICATIONS AT HOME:   Prior to Admission medications   Medication Sig Start Date End Date Taking? Authorizing Provider  albuterol (VENTOLIN HFA) 108 (90 Base) MCG/ACT inhaler Inhale 2 puffs into the lungs every 6 (six) hours as needed for wheezing or shortness of breath. 06/12/19  Yes Amin, Ankit Chirag, MD  amLODipine (NORVASC) 2.5 MG tablet Take 2.5 mg by mouth daily. 06/13/20  Yes [provider]  aspirin 81 MG tablet Take 1 tablet (81 mg total) by mouth daily. 08/09/14  Yes Marletta Lor, MD  Cholecalciferol (VITAMIN D3) 50 MCG (2000 UT) CAPS Take 2,000 Units by mouth daily.   Yes [provider]  famotidine (PEPCID) 20 MG tablet Take 20 mg by mouth 2 (two) times daily. 07/03/20  Yes [provider]  gentamicin cream (GARAMYCIN) 0.1 % Apply 1 application topically daily. 06/17/19  Yes Hall, Carole N, DO  Insulin Glargine (BASAGLAR KWIKPEN) 100 UNIT/ML Inject 10 Units into the skin daily with lunch. 03/26/20  Yes [provider]  mycophenolate (CELLCEPT) 250 MG capsule Take 1,000 mg by mouth in the morning and at bedtime. 10/31/19  Yes [provider]  NOVOLOG FLEXPEN 100 UNIT/ML FlexPen Inject into the skin. 01/21/20  Yes [provider]  oxybutynin (DITROPAN-XL) 5 MG 24 hr tablet Take 5 mg by mouth daily. 06/02/20  Yes [provider]  pantoprazole (PROTONIX) 40 MG tablet Take 1 tablet (40 mg total) by mouth daily. 06/16/19 06/15/20 Yes Hall, Carole N, DO  pravastatin (PRAVACHOL) 10 MG tablet Take 10 mg by mouth at bedtime. 06/10/20  Yes [provider]  predniSONE (DELTASONE) 5 MG tablet Take 5 mg by mouth daily with breakfast.   Yes [provider]   sulfamethoxazole-trimethoprim (BACTRIM) 400-80 MG tablet Take 1 tablet by mouth every Monday, Wednesday, and Friday. 06/13/20  Yes [provider]  tacrolimus (PROGRAF) 1 MG capsule Take 5 mg by mouth in the morning and at bedtime. 10/31/19  Yes [provider]  valGANciclovir (VALCYTE) 450 MG tablet Take by mouth. 10/31/19  Yes [provider]      VITAL SIGNS:  Blood pressure (!) 177/74, pulse 91, temperature 98.4 F (36.9 C), resp. rate 12, height 5\' 8"  (1.727 m), weight 69.4 kg, SpO2 100 %.  PHYSICAL EXAMINATION:  Physical Exam  GENERAL:  70 y.o.-year-old patient lying in the bed with no acute distress.  EYES: Pupils equal, round, reactive to light and accommodation. No scleral icterus. Extraocular muscles intact.  HEENT: Head atraumatic, normocephalic. Oropharynx and  nasopharynx clear.  NECK:  Supple, no jugular venous distention. No thyroid enlargement, no tenderness.  LUNGS: Normal breath sounds bilaterally, no wheezing, rales,rhonchi or crepitation. No use of accessory muscles of respiration.  CARDIOVASCULAR: Regular rate and rhythm, S1, S2 normal. No murmurs, rubs, or gallops.  ABDOMEN: Soft, nondistended, nontender. Bowel sounds present. No organomegaly or mass.  EXTREMITIES: No pedal edema, cyanosis, or clubbing.  NEUROLOGIC: Cranial nerves II through XII are intact. Muscle strength 5/5 in all extremities. Sensation intact. Gait not checked.  PSYCHIATRIC: The patient is alert and oriented to his name and place only but not to time.. Flat affect and good eye contact. SKIN: No obvious rash, lesion, or ulcer.   LABORATORY PANEL:   CBC Recent Labs  Lab 07/05/20 2222  WBC 7.9  HGB 7.9*  HCT 22.9*  PLT 278   ------------------------------------------------------------------------------------------------------------------  Chemistries  Recent Labs  Lab 07/05/20 2222  NA 125*  K 4.9  CL 94*  CO2 19*  GLUCOSE 169*  BUN 46*  CREATININE 2.46*   CALCIUM 10.2   ------------------------------------------------------------------------------------------------------------------  Cardiac Enzymes No results for input(s): TROPONINI in the last 168 hours. ------------------------------------------------------------------------------------------------------------------  RADIOLOGY:  CT HEAD WO CONTRAST  Result Date: 07/06/2020 CLINICAL DATA:  Delirium. EXAM: CT HEAD WITHOUT CONTRAST TECHNIQUE: Contiguous axial images were obtained from the base of the skull through the vertex without intravenous contrast. COMPARISON:  June 10, 2019 FINDINGS: Brain: There is mild cerebral atrophy with widening of the extra-axial spaces and ventricular dilatation. There are areas of decreased attenuation within the white matter tracts of the supratentorial brain, consistent with microvascular disease changes. Vascular: No hyperdense vessel or unexpected calcification. Skull: Normal. Negative for fracture or focal lesion. Sinuses/Orbits: No acute finding. Other: None. IMPRESSION: 1. Generalized cerebral atrophy. 2. No acute intracranial abnormality. Electronically Signed   By: Virgina Norfolk M.D.   On: 07/06/2020 00:12      IMPRESSION AND PLAN:  Active Problems:   AMS (altered mental status)  1. Acute encephalopathy likely multifactorial secondary to UTI, acute kidney injury hypertensive urgency and hyponatremia. -The patient will be admitted to a medical monitored bed. -He'll be hydrated with IV normal saline and will follow BMP. -We'll continue IV antibiotic therapy with IV Rocephin and follow urine culture. -We'll check TSH urine sodium and potassium and serum and urine osmolality and follow sodium level with hydration. -Neurochecks will be followed every 4 hours for 24 hours.  2. Hypertensive urgency. -This could be contributing to hypertensive encephalopathy. -The patient will be placed on as needed IV labetalol and will continue his  antihypertensives.  3. Type 2 diabetes mellitus. -The patient will be placed on supplement coverage with NovoLog and will continue his basal coverage.  4. Status post renal implant. -We'll continue his immunosuppressive therapy.  DVT prophylaxis: Lovenox. Code Status: full code. Family Communication:  The plan of care was discussed in details with the patient (and family). I answered all questions. The patient agreed to proceed with the above mentioned plan. Further management will depend upon hospital course. Disposition Plan: Back to previous home environment Consults called: none. All the records are reviewed and case discussed with ED provider.  Status is: Inpatient  Remains inpatient appropriate because:Altered mental status, Ongoing diagnostic testing needed not appropriate for outpatient work up, Unsafe d/c plan, IV treatments appropriate due to intensity of illness or inability to take PO and Inpatient level of care appropriate due to severity of illness   Dispo: The patient is from: Home  Anticipated d/c is to: Home              Anticipated d/c date is: 2 days              Patient currently is not medically stable to d/c.   Difficult to place patient No   TOTAL TIME TAKING CARE OF THIS PATIENT: 55 minutes.    Christel Mormon M.D on 07/06/2020 at 2:24 AM  [facility]   From 7 PM-7 AM, contact night-coverage [email or web address]  CC: Primary care physician; [name], [name], MD

## 2020-07-06 NOTE — ED Notes (Signed)
Pt's daughter would like to be called with any updates and/or any questions

## 2020-07-07 DIAGNOSIS — E871 Hypo-osmolality and hyponatremia: Secondary | ICD-10-CM | POA: Diagnosis not present

## 2020-07-07 DIAGNOSIS — R400 Somnolence: Secondary | ICD-10-CM | POA: Diagnosis not present

## 2020-07-07 DIAGNOSIS — N179 Acute kidney failure, unspecified: Secondary | ICD-10-CM | POA: Diagnosis not present

## 2020-07-07 DIAGNOSIS — N390 Urinary tract infection, site not specified: Secondary | ICD-10-CM | POA: Diagnosis not present

## 2020-07-07 LAB — COMPREHENSIVE METABOLIC PANEL
ALT: 9 U/L (ref 0–44)
AST: 15 U/L (ref 15–41)
Albumin: 3.4 g/dL — ABNORMAL LOW (ref 3.5–5.0)
Alkaline Phosphatase: 38 U/L (ref 38–126)
Anion gap: 12 (ref 5–15)
BUN: 28 mg/dL — ABNORMAL HIGH (ref 8–23)
CO2: 22 mmol/L (ref 22–32)
Calcium: 10 mg/dL (ref 8.9–10.3)
Chloride: 96 mmol/L — ABNORMAL LOW (ref 98–111)
Creatinine, Ser: 1.61 mg/dL — ABNORMAL HIGH (ref 0.61–1.24)
GFR, Estimated: 45 mL/min — ABNORMAL LOW (ref >60)
Glucose, Bld: 94 mg/dL (ref 70–99)
Potassium: 4.2 mmol/L (ref 3.5–5.1)
Sodium: 130 mmol/L — ABNORMAL LOW (ref 135–145)
Total Bilirubin: 0.3 mg/dL (ref 0.3–1.2)
Total Protein: 6.3 g/dL — ABNORMAL LOW (ref 6.5–8.1)

## 2020-07-07 LAB — CBC WITH DIFFERENTIAL/PLATELET
Abs Immature Granulocytes: 0.62 10*3/uL — ABNORMAL HIGH (ref 0.00–0.07)
Basophils Absolute: 0 10*3/uL (ref 0.0–0.1)
Basophils Relative: 1 %
Eosinophils Absolute: 0.1 10*3/uL (ref 0.0–0.5)
Eosinophils Relative: 2 %
HCT: 21.5 % — ABNORMAL LOW (ref 39.0–52.0)
Hemoglobin: 7.3 g/dL — ABNORMAL LOW (ref 13.0–17.0)
Immature Granulocytes: 9 %
Lymphocytes Relative: 2 %
Lymphs Abs: 0.1 10*3/uL — ABNORMAL LOW (ref 0.7–4.0)
MCH: 29.7 pg (ref 26.0–34.0)
MCHC: 34 g/dL (ref 30.0–36.0)
MCV: 87.4 fL (ref 80.0–100.0)
Monocytes Absolute: 1.2 10*3/uL — ABNORMAL HIGH (ref 0.1–1.0)
Monocytes Relative: 16 %
Neutro Abs: 5.2 10*3/uL (ref 1.7–7.7)
Neutrophils Relative %: 70 %
Platelets: 260 10*3/uL (ref 150–400)
RBC: 2.46 MIL/uL — ABNORMAL LOW (ref 4.22–5.81)
RDW: 13.6 % (ref 11.5–15.5)
WBC: 7.2 10*3/uL (ref 4.0–10.5)
nRBC: 0 % (ref 0.0–0.2)

## 2020-07-07 LAB — GLUCOSE, CAPILLARY
Glucose-Capillary: 100 mg/dL — ABNORMAL HIGH (ref 70–99)
Glucose-Capillary: 174 mg/dL — ABNORMAL HIGH (ref 70–99)

## 2020-07-07 LAB — PHOSPHORUS: Phosphorus: 2.6 mg/dL (ref 2.5–4.6)

## 2020-07-07 LAB — NA AND K (SODIUM & POTASSIUM), 24 H UR
Potassium Urine: 19 mmol/L
Potassium, 24H Ur: 38 mmol/d (ref 25–125)
Sodium, 24H Ur: 238 mEq/d — ABNORMAL HIGH (ref 40–220)
Sodium, Ur: 119 mmol/L
Urine Total Volume-UNAK24: 2000 mL

## 2020-07-07 LAB — RESP PANEL BY RT-PCR (FLU A&B, COVID) ARPGX2
Influenza A by PCR: NEGATIVE
Influenza B by PCR: NEGATIVE
SARS Coronavirus 2 by RT PCR: NEGATIVE

## 2020-07-07 LAB — MAGNESIUM: Magnesium: 1.5 mg/dL — ABNORMAL LOW (ref 1.7–2.4)

## 2020-07-07 LAB — URIC ACID: Uric Acid, Serum: 5 mg/dL (ref 3.7–8.6)

## 2020-07-07 MED ORDER — METOPROLOL TARTRATE 25 MG PO TABS: 12.5000 mg | ORAL_TABLET | Freq: Two times a day (BID) | ORAL | 0 refills | Status: DC

## 2020-07-07 MED ORDER — INSULIN ASPART 100 UNIT/ML ~~LOC~~ SOLN: 0.0000 [IU] | Freq: Three times a day (TID) | SUBCUTANEOUS | 0 refills | Status: DC

## 2020-07-07 MED ORDER — METOPROLOL TARTRATE 12.5 MG HALF TABLET
12.5000 mg | ORAL_TABLET | Freq: Two times a day (BID) | ORAL | Status: DC
  Administered 2020-07-07: 12.5 mg via ORAL
  Filled 2020-07-07: qty 1

## 2020-07-07 MED ORDER — VALGANCICLOVIR HCL 450 MG PO TABS: 450.0000 mg | ORAL_TABLET | Freq: Every day | ORAL | 0 refills | Status: DC

## 2020-07-07 MED ORDER — TRAZODONE HCL 50 MG PO TABS: 25.0000 mg | ORAL_TABLET | Freq: Every evening | ORAL | 0 refills | Status: DC | PRN

## 2020-07-07 MED ORDER — ACETAMINOPHEN 325 MG PO TABS: 650.0000 mg | ORAL_TABLET | Freq: Four times a day (QID) | ORAL | 0 refills | Status: DC | PRN

## 2020-07-07 MED ORDER — ENOXAPARIN SODIUM 30 MG/0.3ML ~~LOC~~ SOLN: 30.0000 mg | SUBCUTANEOUS | 0 refills | Status: DC

## 2020-07-07 MED ORDER — LABETALOL HCL 5 MG/ML IV SOLN: 20.0000 mg | INTRAVENOUS | 0 refills | Status: DC | PRN

## 2020-07-07 MED ORDER — SODIUM CHLORIDE 0.9 % IV SOLN: 1.0000 g | INTRAVENOUS | 0 refills | Status: DC

## 2020-07-07 MED ORDER — ACETAMINOPHEN 650 MG RE SUPP: 650.0000 mg | Freq: Four times a day (QID) | RECTAL | 0 refills | Status: DC | PRN

## 2020-07-07 MED ORDER — AMLODIPINE BESYLATE 5 MG PO TABS: 5.0000 mg | ORAL_TABLET | Freq: Every day | ORAL | 0 refills | Status: DC

## 2020-07-07 MED ORDER — ONDANSETRON HCL 4 MG PO TABS: 4.0000 mg | ORAL_TABLET | Freq: Four times a day (QID) | ORAL | 0 refills | Status: DC | PRN

## 2020-07-07 MED ORDER — ONDANSETRON HCL 4 MG/2ML IJ SOLN: 4.0000 mg | Freq: Four times a day (QID) | INTRAMUSCULAR | 0 refills | Status: DC | PRN

## 2020-07-07 MED ORDER — INSULIN GLARGINE 100 UNIT/ML ~~LOC~~ SOLN: 10.0000 [IU] | Freq: Every day | SUBCUTANEOUS | 0 refills | Status: DC

## 2020-07-07 NOTE — Progress Notes (Signed)
RN gave report to Kennon Portela at [facility], he stated understanding. PT has been COVID swabbed again last one was negative yesterday at 1441 they need another one within 24hrs. [facility] transport arranged for 12pm

## 2020-07-07 NOTE — Progress Notes (Addendum)
A call received from [facility] that bed is available and [facility] has arrange for transportation. Bed number [place] [id number]  RN should call this number if needed help.  [facility] [phone] Number for RN to call Report is [phone]  CT scan called to transcibe pt's images to [facility]hare for [facility].   Patient stable and comfortable in bed,  Will hand off to incoming Nurse.

## 2020-07-07 NOTE — Discharge Summary (Signed)
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 -2/20 overnight RN received call from [facility] requesting that patient be transferred to them secondary to his renal transplant.  Has a bed available.  Will discharge to [facility] normal saline 158ml/hr -Strict in and out +1.3 L -Daily weight Filed Weights   07/05/20 2208  Weight: 69.4 kg  -2/20 [facility] has requested patient be transferred to them, where he received his Renal transplant.  -Mycophenolate 1000 mg BID -Prednisone 5 mg daily -Tacrolimus 5 mg BID -Valganciclovir 450 mg daily -2/19 Tacrolimus level pending  Hypertensive urgency -2/19 increase Amlodipine 5 mg daily -2/20 Metoprolol 12.5 mg BID  Hyponatremia -Should correct with hydration. Lab Results  Component Value Date   NA 130 (L) 07/07/2020   NA 129 (L) 07/06/2020   NA 125 (L) 07/05/2020   NA 129 (L) 01/16/2020   NA 135 06/16/2019  -Slowly improving with sodium bicarb hydration. 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 He has been noticed to not being able to dressing appropriately yesterday. His symptoms have been worsening over the last couple of days. He admits to urinary frequency and urgency with no dysuria or hematuria or flank pain. No nausea or vomiting or abdominal pain. Is a fairly poor historian due to altered mental status. No chest pain or palpitations were reported. No reported cough or wheezing good dyspnea. ED Course:Blood pressure was183/66 and later 194/73 when he came to the ER with otherwise normal vital signs. Labs revealedhyponatremia hypochloremia and a glucose of 179 with a BUN of 46 and creatinine of 2.46 up from previous levels with high sensitive point of 16 and CBC showed hemoglobin of 7.9 and hematocrit 22.9 compared to 9.8/32.2 on 01/16/2020.  EKG as reviewed by me :Showed normal sinus rhythm with a rate of 100 with minimal voltage criteria for LVH with peaked T waves and early repolarization in V3 through V5 Imaging:Noncontrasted head CT scan revealed generalized cerebral atrophy with no acute intracranial normalities.  The patient was given 2 g of IV Rocephin and 1 L bolus of IV normal saline. He will be admitted to a medical monitored bed for further evaluation and management   Hospital Course:  See above  Procedures: 2/18 CT head W0 contrast; no acute intracranial abnormality   Cultures  2/19 SARS coronavirus negative 2/19 influenza A/B negative  Antibiotics Anti-infectives (From admission, onward)    Start     Ordered Stop   07/06/20 2200  cefTRIAXone (ROCEPHIN) 1 g in sodium chloride 0.9 % 100 mL IVPB        07/06/20 0312     07/06/20 1130  valGANciclovir (VALCYTE) 450 MG tablet TABS 450 mg        07/06/20 0309     07/05/20 2345  cefTRIAXone (ROCEPHIN) 2 g in sodium chloride 0.9 % 100 mL IVPB        07/05/20 2342 07/06/20 0138       Discharge Exam: Vitals:   07/06/20 2215 07/06/20 2248 07/07/20 0231 07/07/20 0512  BP: 117/64 (!) 103/57 (!) 142/75 (!) 150/74  Pulse: 91 88 82 94  Resp: 14 16 16 18   Temp:  98.2 F (36.8 C) 98.6 F (37 C) 98.9 F (37.2 C)  TempSrc:  Oral Oral Oral  SpO2: 99% 100% 100% 100%  Weight:      Height:        General: A/O x3 (does not know why) No acute respiratory distress, cachectic Eyes: negative scleral hemorrhage, negative anisocoria, negative icterus ENT: Negative Runny nose, negative gingival bleeding, Neck:  Negative scars, masses, torticollis, lymphadenopathy, JVD Lungs: Clear to auscultation bilaterally without wheezes or crackles Cardiovascular: Regular rate and rhythm without murmur gallop or rub normal S1 and S2   Discharge Instructions   Allergies as of 07/07/2020      Reactions   Other Rash   Oxyquinoline-white Pet-lanolin   No Known Allergies    Bag Balm  [albolene] Rash      Medication List    STOP taking these medications   Basaglar KwikPen 100 UNIT/ML Replaced by: insulin glargine 100 UNIT/ML injection   famotidine 20 MG tablet Commonly known as: PEPCID   gentamicin cream 0.1 % Commonly known as: GARAMYCIN   NovoLOG FlexPen 100 UNIT/ML FlexPen Generic drug: insulin aspart Replaced by: insulin aspart 100 UNIT/ML injection   sulfamethoxazole-trimethoprim 400-80 MG tablet Commonly known as: BACTRIM     TAKE these medications   acetaminophen 325 MG tablet Commonly known as: TYLENOL Take 2 tablets (650 mg total) by mouth every 6 (six) hours as needed for mild pain (or Fever >/= 101).   acetaminophen 650  MG suppository Commonly known as: TYLENOL Place 1 suppository (650 mg total) rectally every 6 (six) hours as needed for mild pain (or Fever >/= 101).   albuterol 108 (90 Base) MCG/ACT inhaler Commonly known as: VENTOLIN HFA Inhale 2 puffs into the lungs every 6 (six) hours as needed for wheezing or shortness of breath.   amLODipine 5 MG tablet Commonly known as: NORVASC Take 1 tablet (5 mg total) by mouth daily. What changed:   medication strength  how much to take   aspirin 81 MG tablet Take 1 tablet (81 mg total) by mouth daily.   cefTRIAXone 1 g in sodium chloride 0.9 % 100 mL Inject 1 g into the vein daily.   enoxaparin 30 MG/0.3ML injection Commonly known as: LOVENOX Inject 0.3 mLs (30 mg total) into the skin daily.   insulin aspart 100 UNIT/ML injection Commonly known as: novoLOG Inject 0-15 Units into the skin 4 (four) times daily - after meals and at bedtime. Replaces: NovoLOG FlexPen 100 UNIT/ML FlexPen   insulin glargine 100 UNIT/ML injection Commonly known as: LANTUS Inject 0.1  mLs (10 Units total) into the skin daily with lunch. Replaces: Basaglar KwikPen 100 UNIT/ML   labetalol 5 MG/ML injection Commonly known as: NORMODYNE Inject 4 mLs (20 mg total) into the vein every 3 (three) hours as needed (SBP more than 160 or DBP more than 100).   metoprolol tartrate 25 MG tablet Commonly known as: LOPRESSOR Take 0.5 tablets (12.5 mg total) by mouth 2 (two) times daily.   mycophenolate 250 MG capsule Commonly known as: CELLCEPT Take 1,000 mg by mouth in the morning and at bedtime.   ondansetron 4 MG tablet Commonly known as: ZOFRAN Take 1 tablet (4 mg total) by mouth every 6 (six) hours as needed for nausea.   ondansetron 4 MG/2ML Soln injection Commonly known as: ZOFRAN Inject 2 mLs (4 mg total) into the vein every 6 (six) hours as needed for nausea.   oxybutynin 5 MG 24 hr tablet Commonly known as: DITROPAN-XL Take 5 mg by mouth daily.   pantoprazole  40 MG tablet Commonly known as: Protonix Take 1 tablet (40 mg total) by mouth daily.   pravastatin 10 MG tablet Commonly known as: PRAVACHOL Take 10 mg by mouth at bedtime.   predniSONE 5 MG tablet Commonly known as: DELTASONE Take 5 mg by mouth daily with breakfast.   tacrolimus 1 MG capsule Commonly known as: PROGRAF Take 5 mg by mouth in the morning and at bedtime.   traZODone 50 MG tablet Commonly known as: DESYREL Take 0.5 tablets (25 mg total) by mouth at bedtime as needed for sleep.   valGANciclovir 450 MG tablet Commonly known as: VALCYTE Take 1 tablet (450 mg total) by mouth daily. What changed:   how much to take  when to take this   vitamin D3 50 MCG (2000 UT) Caps Take 2,000 Units by mouth daily.      Allergies  Allergen Reactions  . Other Rash    Oxyquinoline-white Pet-lanolin  . No Known Allergies   . Bag Balm  [Albolene] Rash      The results of significant diagnostics from this hospitalization (including imaging, microbiology, ancillary and laboratory) are listed below for reference.    Significant Diagnostic Studies: CT HEAD WO CONTRAST  Result Date: 07/06/2020 CLINICAL DATA:  Delirium. EXAM: CT HEAD WITHOUT CONTRAST TECHNIQUE: Contiguous axial images were obtained from the base of the skull through the vertex without intravenous contrast. COMPARISON:  June 10, 2019 FINDINGS: Brain: There is mild cerebral atrophy with widening of the extra-axial spaces and ventricular dilatation. There are areas of decreased attenuation within the white matter tracts of the supratentorial brain, consistent with microvascular disease changes. Vascular: No hyperdense vessel or unexpected calcification. Skull: Normal. Negative for fracture or focal lesion. Sinuses/Orbits: No acute finding. Other: None. IMPRESSION: 1. Generalized cerebral atrophy. 2. No acute intracranial abnormality. Electronically Signed   By: Virgina Norfolk M.D.   On: 07/06/2020 00:12     Microbiology: Recent Results (from the past 240 hour(s))  Resp Panel by RT-PCR (Flu A&B, Covid) Nasopharyngeal Swab     Status: None   Collection Time: 07/06/20  2:41 PM   Specimen: Nasopharyngeal Swab; Nasopharyngeal(NP) swabs in vial transport medium  Result Value Ref Range Status   SARS Coronavirus 2 by RT PCR NEGATIVE NEGATIVE Final    Comment: (NOTE) SARS-CoV-2 target nucleic acids are NOT DETECTED.  The SARS-CoV-2 RNA is generally detectable in upper respiratory specimens during the acute phase of infection. The lowest concentration of SARS-CoV-2 viral copies this assay can detect is 138  copies/mL. A negative result does not preclude SARS-Cov-2 infection and should not be used as the sole basis for treatment or other patient management decisions. A negative result may occur with  improper specimen collection/handling, submission of specimen other than nasopharyngeal swab, presence of viral mutation(s) within the areas targeted by this assay, and inadequate number of viral copies(<138 copies/mL). A negative result must be combined with clinical observations, patient history, and epidemiological information. The expected result is Negative.  Fact Sheet for Patients:  EntrepreneurPulse.com.au  Fact Sheet for Healthcare Providers:  IncredibleEmployment.be  This test is no t yet approved or cleared by the [place] FDA and  has been authorized for detection and/or diagnosis of SARS-CoV-2 by FDA under an Emergency Use Authorization (EUA). This EUA will remain  in effect (meaning this test can be used) for the duration of the COVID-19 declaration under Section 564(b)(1) of the Act, 21 U.S.C.section 360bbb-3(b)(1), unless the authorization is terminated  or revoked sooner.       Influenza A by PCR NEGATIVE NEGATIVE Final   Influenza B by PCR NEGATIVE NEGATIVE Final    Comment: (NOTE) The Xpert Xpress SARS-CoV-2/FLU/RSV plus assay is  intended as an aid in the diagnosis of influenza from Nasopharyngeal swab specimens and should not be used as a sole basis for treatment. Nasal washings and aspirates are unacceptable for Xpert Xpress SARS-CoV-2/FLU/RSV testing.  Fact Sheet for Patients: EntrepreneurPulse.com.au  Fact Sheet for Healthcare Providers: IncredibleEmployment.be  This test is not yet approved or cleared by the [place] FDA and has been authorized for detection and/or diagnosis of SARS-CoV-2 by FDA under an Emergency Use Authorization (EUA). This EUA will remain in effect (meaning this test can be used) for the duration of the COVID-19 declaration under Section 564(b)(1) of the Act, 21 U.S.C. section 360bbb-3(b)(1), unless the authorization is terminated or revoked.  Performed at [facility], [address] [place], [place] [address]      Labs: Basic Metabolic Panel: Recent Labs  Lab 07/05/20 2222 07/06/20 0328 07/07/20 0416  NA 125* 129* 130*  K 4.9 4.7 4.2  CL 94* 96* 96*  CO2 19* 18* 22  GLUCOSE 169* 154* 94  BUN 46* 39* 28*  CREATININE 2.46* 2.21* 1.61*  CALCIUM 10.2 10.2 10.0  MG  --   --  1.5*  PHOS  --   --  2.6   Liver Function Tests: Recent Labs  Lab 07/07/20 0416  AST 15  ALT 9  ALKPHOS 38  BILITOT 0.3  PROT 6.3*  ALBUMIN 3.4*   No results for input(s): LIPASE, AMYLASE in the last 168 hours. No results for input(s): AMMONIA in the last 168 hours. CBC: Recent Labs  Lab 07/05/20 2222 07/06/20 0328 07/07/20 0416  WBC 7.9 7.7 7.2  NEUTROABS  --   --  5.2  HGB 7.9* 7.9* 7.3*  HCT 22.9* 22.9* 21.5*  MCV 88.1 88.4 87.4  PLT 278 253 260   Cardiac Enzymes: No results for input(s): CKTOTAL, CKMB, CKMBINDEX, TROPONINI in the last 168 hours. BNP: BNP (last 3 results) No results for input(s): BNP in the last 8760 hours.  ProBNP (last 3 results) No results for input(s): PROBNP in the last 8760 hours.  CBG: Recent Labs   Lab 07/06/20 0750 07/06/20 1158 07/06/20 1725 07/06/20 2246 07/07/20 0618  GLUCAP 147* 212* 103* 148* 100*       Signed:  Dia Crawford, MD [facility]

## 2020-07-08 DIAGNOSIS — R531 Weakness: Secondary | ICD-10-CM | POA: Insufficient documentation

## 2020-07-10 DIAGNOSIS — E222 Syndrome of inappropriate secretion of antidiuretic hormone: Secondary | ICD-10-CM | POA: Insufficient documentation

## 2020-07-10 DIAGNOSIS — R5381 Other malaise: Secondary | ICD-10-CM | POA: Insufficient documentation

## 2020-07-10 DIAGNOSIS — R634 Abnormal weight loss: Secondary | ICD-10-CM | POA: Insufficient documentation

## 2020-07-10 DIAGNOSIS — B451 Cerebral cryptococcosis: Secondary | ICD-10-CM | POA: Insufficient documentation

## 2020-07-10 DIAGNOSIS — R519 Headache, unspecified: Secondary | ICD-10-CM | POA: Insufficient documentation

## 2020-07-10 DIAGNOSIS — K629 Disease of anus and rectum, unspecified: Secondary | ICD-10-CM | POA: Insufficient documentation

## 2020-07-10 LAB — TACROLIMUS LEVEL: Tacrolimus (FK506) - LabCorp: 12.4 ng/mL (ref 2.0–20.0)

## 2020-07-11 DIAGNOSIS — R1312 Dysphagia, oropharyngeal phase: Secondary | ICD-10-CM | POA: Insufficient documentation

## 2020-07-11 DIAGNOSIS — B009 Herpesviral infection, unspecified: Secondary | ICD-10-CM | POA: Insufficient documentation

## 2020-07-11 DIAGNOSIS — Q800 Ichthyosis vulgaris: Secondary | ICD-10-CM | POA: Insufficient documentation

## 2020-07-28 IMAGING — MR MR HEAD W/O CM
11 of 13 series · 34 of 48 positions shown · non-contrast
Comparison: None.

CLINICAL DATA: Seizure, COVID positive

EXAM:
MRI HEAD WITHOUT CONTRAST
TECHNIQUE: Multiplanar, multiecho pulse sequences of the brain and surrounding
structures were obtained without intravenous contrast.

[Series 3: DWI · axial · 3.0mm · 1.09mm/px · z∈[-114,+34]mm · 8 of 110 slices shown (1 of 4)]
[im 1/110]
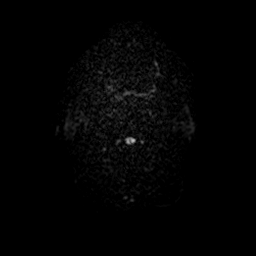
[im 13/110]
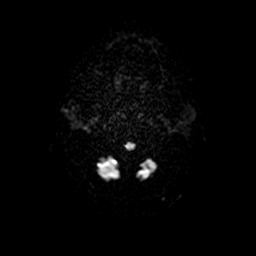
[im 37/110]
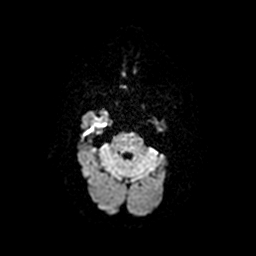
[im 49/110]
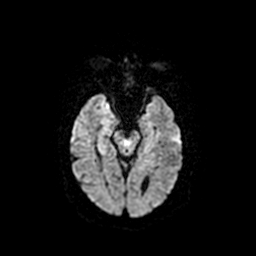
[im 61/110]
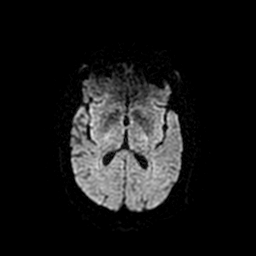
[im 73/110]
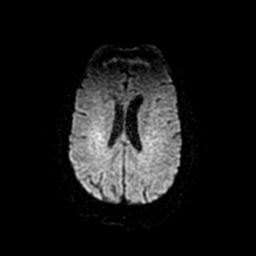
[im 97/110]
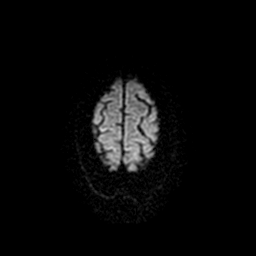
[im 110/110]
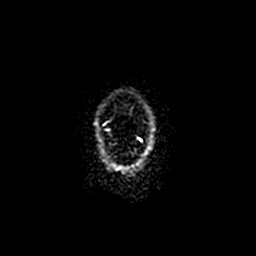

[Series 4: T1 · sagittal · 5.0mm · 0.51mm/px · 1 of 25 slices shown]
[im 1/25]
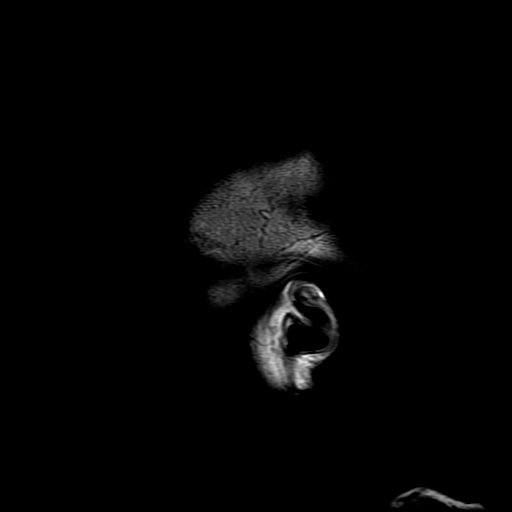

[Series 5: T2 · axial · 5.0mm · 0.45mm/px · z∈[-101,+46]mm · 2 of 28 slices shown (1 of 4)]
[im 1/28]
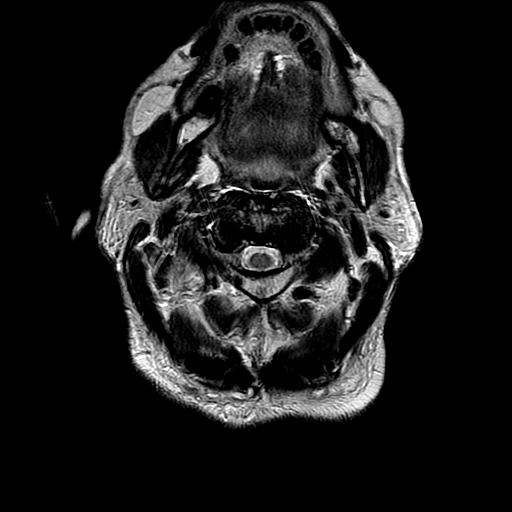
[im 28/28]
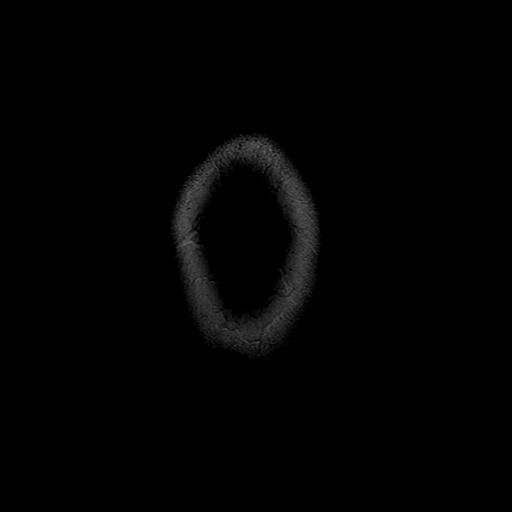

[Series 6: DWI · coronal · 5.0mm · 1.09mm/px · 6 of 80 slices shown (2 of 4)]
[im 1/80]
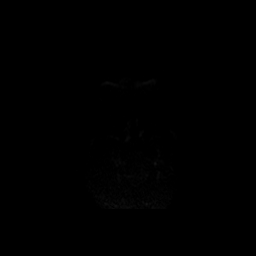
[im 16/80]
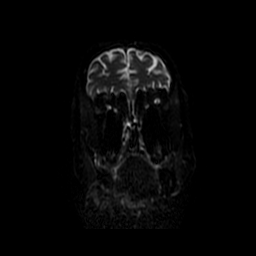
[im 32/80]
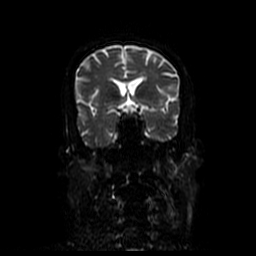
[im 48/80]
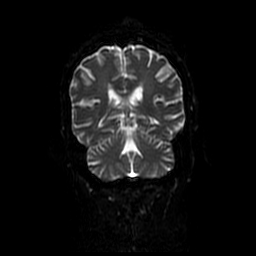
[im 64/80]
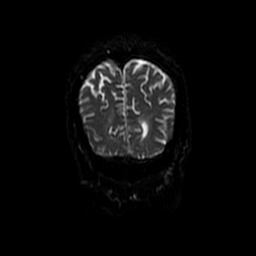
[im 80/80]
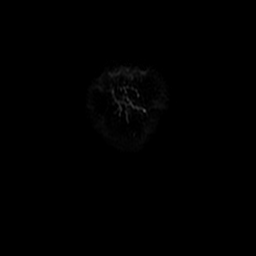

[Series 7: FLAIR · axial · 3.0mm · 0.43mm/px · z∈[-96,+45]mm · 2 of 27 slices shown (1 of 2)]
[im 1/27]
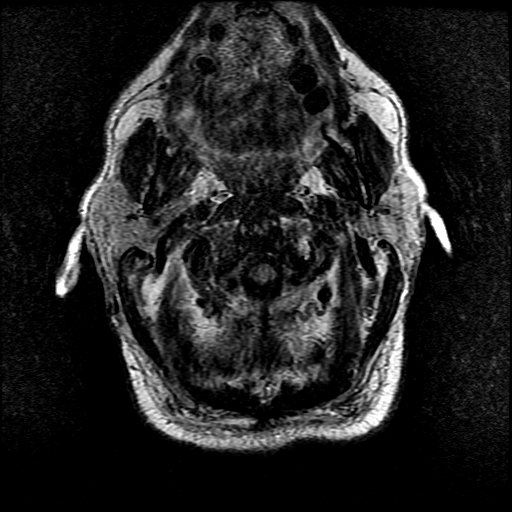
[im 27/27]
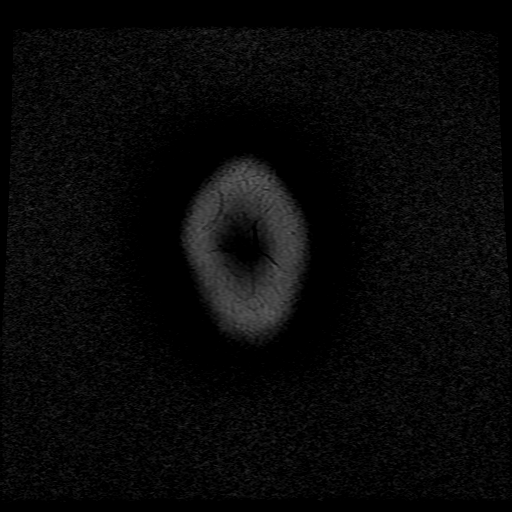

[Series 10: T2 · coronal · 3.0mm · 0.35mm/px · 2 of 27 slices shown (2 of 4)]
[im 1/27]
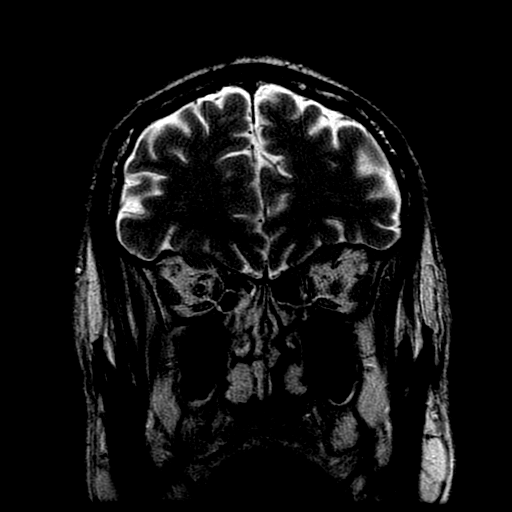
[im 27/27]
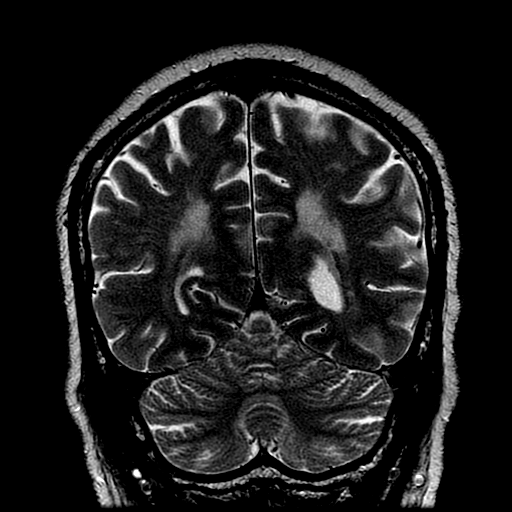

[Series 11: T2 · coronal · 5.0mm · 0.35mm/px · 1 of 15 slices shown (3 of 4)]
[im 1/15]
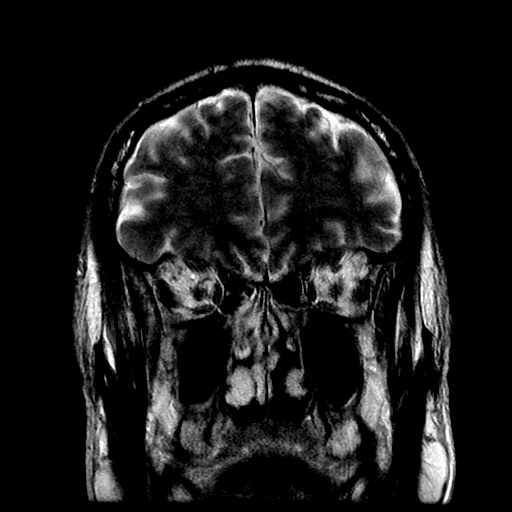

[Series 12: FLAIR · coronal · 3.0mm · 0.35mm/px · 3 of 32 slices shown (2 of 2)]
[im 1/32]
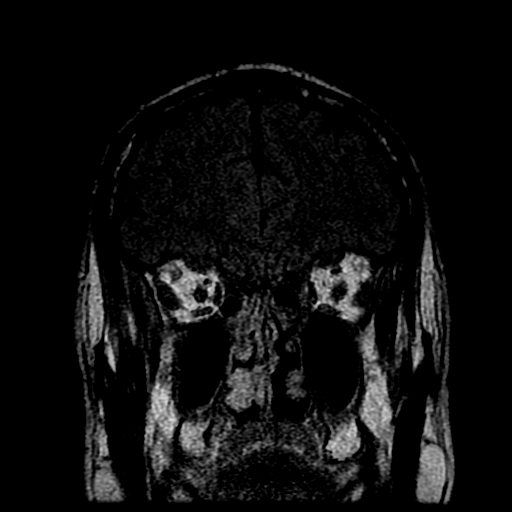
[im 16/32]
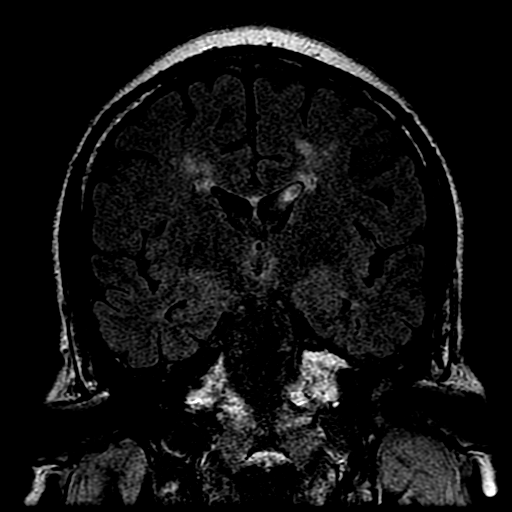
[im 32/32]
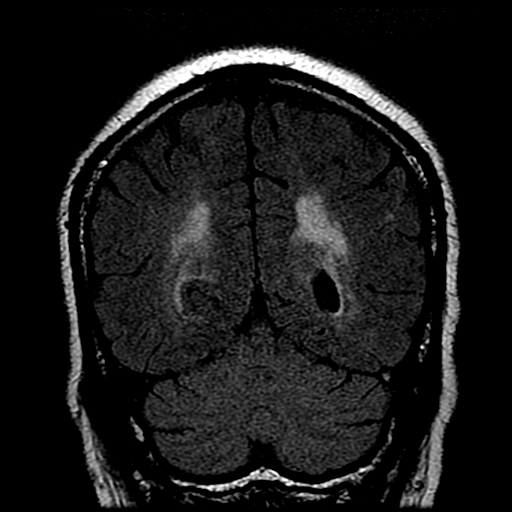

[Series 13: T2 · coronal · 5.0mm · 0.35mm/px · 2 of 29 slices shown (4 of 4)]
[im 1/29]
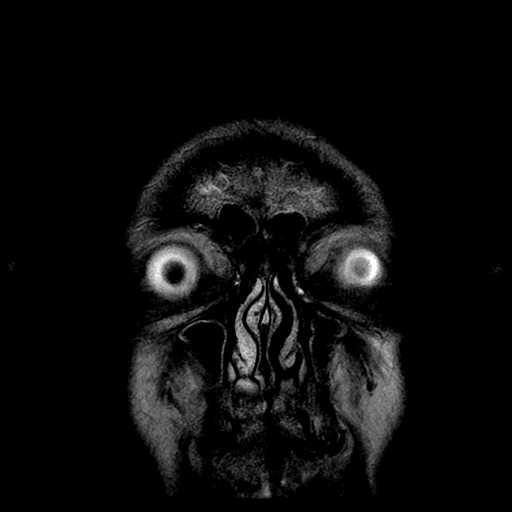
[im 29/29]
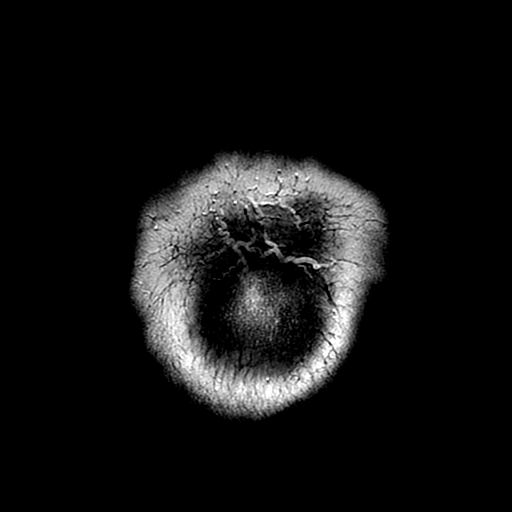

[Series 300: DWI · axial · 3.0mm · 1.09mm/px · z∈[-114,+34]mm · 4 of 55 slices shown (3 of 4)]
[im 1/55]
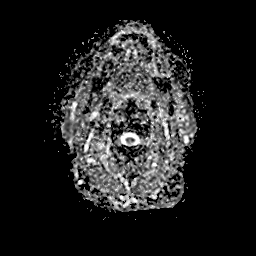
[im 19/55]
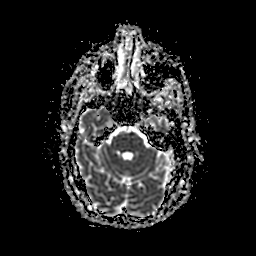
[im 37/55]
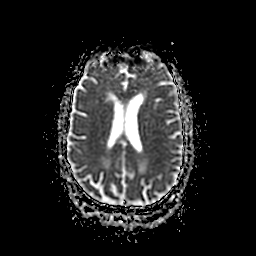
[im 55/55]
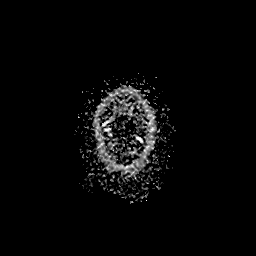

[Series 600: DWI · coronal · 5.0mm · 1.09mm/px · 3 of 40 slices shown (4 of 4)]
[im 1/40]
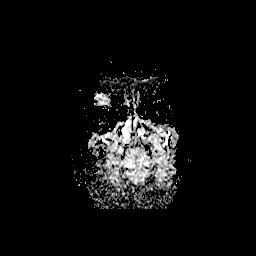
[im 20/40]
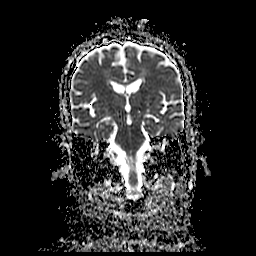
[im 40/40]
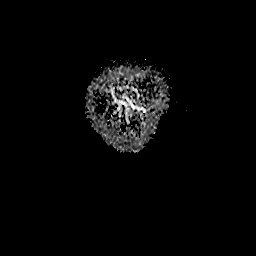

[34 of 48 positions shown; findings below may reference images not displayed]

FINDINGS: Brain: There is no acute infarction or intracranial hemorrhage.
There is no intracranial mass, mass effect, or edema. There is no
hydrocephalus or extra-axial fluid collection. Patchy and confluent
T2 hyperintensity in the supratentorial white matter is nonspecific
but may reflect mild to moderate chronic microvascular ischemic
changes.

Vascular: Major vessel flow voids at the skull base are preserved.

Skull and upper cervical spine: Normal marrow signal is preserved.

Sinuses/Orbits: Mild mucosal thickening.  Orbits are unremarkable.

Other: Sella is unremarkable.  Mastoid air cells are clear.
IMPRESSION: No intracranial mass.  Chronic microvascular ischemic changes.

## 2020-09-04 ENCOUNTER — Ambulatory Visit: Payer: Medicare Other | Admitting: Podiatry

## 2020-09-13 ENCOUNTER — Telehealth: Payer: Self-pay | Admitting: Internal Medicine

## 2020-09-13 NOTE — Telephone Encounter (Signed)
Verbal order from Dr. Jerilee Hoh.  Requesting a call back.

## 2020-09-16 NOTE — Telephone Encounter (Signed)
Spoke with the PT at the number below for actual orders.  PT stated he would like to have order for: 1) PT-1x a week x3 weeks --2x a week x3 weeks --1x a week x4 weeks   2) Speech therapy eval  Message sent to PCP.

## 2020-09-17 ENCOUNTER — Telehealth: Payer: Self-pay | Admitting: Internal Medicine

## 2020-09-17 NOTE — Telephone Encounter (Signed)
Ok to order PT orders as requested.

## 2020-09-17 NOTE — Telephone Encounter (Signed)
fyi  Spoke with pharmacist and there is an interaction with astagraf and fluconazole.  I explained that Dr Jerilee Hoh did not prescribe these medications.  Patient has a follow up hospital visit coming up.

## 2020-09-17 NOTE — Telephone Encounter (Signed)
Free Carlton Adam is calling and wanted to speak to someone regarding a drug interaction and is requesting a call back, please advise CB [phone]

## 2020-09-18 NOTE — Telephone Encounter (Signed)
Verbal orders given to [facility] per Dr Jerilee Hoh.

## 2020-09-20 ENCOUNTER — Inpatient Hospital Stay: Payer: Medicare Other | Admitting: Internal Medicine

## 2020-09-20 ENCOUNTER — Other Ambulatory Visit: Payer: Self-pay

## 2020-09-24 ENCOUNTER — Telehealth: Payer: Self-pay | Admitting: Internal Medicine

## 2020-09-24 NOTE — Telephone Encounter (Signed)
FYI:  [name] [name] is calling to report that the pt had a fall on Saturday may 09/21/2020 and pt had no pain or injuries.

## 2020-09-24 NOTE — Telephone Encounter (Signed)
Noted  

## 2020-09-25 ENCOUNTER — Telehealth: Payer: Self-pay | Admitting: Internal Medicine

## 2020-09-25 ENCOUNTER — Inpatient Hospital Stay (HOSPITAL_COMMUNITY): Payer: Medicare Other

## 2020-09-25 ENCOUNTER — Other Ambulatory Visit: Payer: Self-pay

## 2020-09-25 ENCOUNTER — Emergency Department (HOSPITAL_COMMUNITY): Payer: Medicare Other

## 2020-09-25 ENCOUNTER — Inpatient Hospital Stay (HOSPITAL_COMMUNITY)
Admission: EM | Admit: 2020-09-25 | Discharge: 2020-09-28 | DRG: 312 | Disposition: A | Discharge status: Home Health | Payer: Medicare Other | Attending: Family Medicine | Admitting: Family Medicine

## 2020-09-25 DIAGNOSIS — Z9079 Acquired absence of other genital organ(s): Secondary | ICD-10-CM

## 2020-09-25 DIAGNOSIS — N3289 Other specified disorders of bladder: Secondary | ICD-10-CM | POA: Diagnosis present

## 2020-09-25 DIAGNOSIS — E119 Type 2 diabetes mellitus without complications: Secondary | ICD-10-CM

## 2020-09-25 DIAGNOSIS — I6330 Cerebral infarction due to thrombosis of unspecified cerebral artery: Secondary | ICD-10-CM | POA: Insufficient documentation

## 2020-09-25 DIAGNOSIS — D509 Iron deficiency anemia, unspecified: Secondary | ICD-10-CM

## 2020-09-25 DIAGNOSIS — E118 Type 2 diabetes mellitus with unspecified complications: Secondary | ICD-10-CM | POA: Diagnosis not present

## 2020-09-25 DIAGNOSIS — Z794 Long term (current) use of insulin: Secondary | ICD-10-CM

## 2020-09-25 DIAGNOSIS — E875 Hyperkalemia: Secondary | ICD-10-CM | POA: Diagnosis present

## 2020-09-25 DIAGNOSIS — B009 Herpesviral infection, unspecified: Secondary | ICD-10-CM | POA: Diagnosis present

## 2020-09-25 DIAGNOSIS — D709 Neutropenia, unspecified: Secondary | ICD-10-CM | POA: Diagnosis present

## 2020-09-25 DIAGNOSIS — Z940 Kidney transplant status: Secondary | ICD-10-CM

## 2020-09-25 DIAGNOSIS — R9389 Abnormal findings on diagnostic imaging of other specified body structures: Secondary | ICD-10-CM | POA: Diagnosis not present

## 2020-09-25 DIAGNOSIS — R29701 NIHSS score 1: Secondary | ICD-10-CM | POA: Diagnosis present

## 2020-09-25 DIAGNOSIS — Z79899 Other long term (current) drug therapy: Secondary | ICD-10-CM | POA: Diagnosis not present

## 2020-09-25 DIAGNOSIS — M1990 Unspecified osteoarthritis, unspecified site: Secondary | ICD-10-CM | POA: Diagnosis present

## 2020-09-25 DIAGNOSIS — I2699 Other pulmonary embolism without acute cor pulmonale: Secondary | ICD-10-CM | POA: Diagnosis present

## 2020-09-25 DIAGNOSIS — I63341 Cerebral infarction due to thrombosis of right cerebellar artery: Secondary | ICD-10-CM | POA: Diagnosis present

## 2020-09-25 DIAGNOSIS — R55 Syncope and collapse: Secondary | ICD-10-CM

## 2020-09-25 DIAGNOSIS — Z87891 Personal history of nicotine dependence: Secondary | ICD-10-CM

## 2020-09-25 DIAGNOSIS — G9340 Encephalopathy, unspecified: Secondary | ICD-10-CM | POA: Diagnosis not present

## 2020-09-25 DIAGNOSIS — G609 Hereditary and idiopathic neuropathy, unspecified: Secondary | ICD-10-CM | POA: Diagnosis present

## 2020-09-25 DIAGNOSIS — E1140 Type 2 diabetes mellitus with diabetic neuropathy, unspecified: Secondary | ICD-10-CM

## 2020-09-25 DIAGNOSIS — Z79890 Hormone replacement therapy (postmenopausal): Secondary | ICD-10-CM

## 2020-09-25 DIAGNOSIS — E1165 Type 2 diabetes mellitus with hyperglycemia: Secondary | ICD-10-CM | POA: Diagnosis present

## 2020-09-25 DIAGNOSIS — I951 Orthostatic hypotension: Principal | ICD-10-CM | POA: Diagnosis present

## 2020-09-25 DIAGNOSIS — E1122 Type 2 diabetes mellitus with diabetic chronic kidney disease: Secondary | ICD-10-CM | POA: Diagnosis present

## 2020-09-25 DIAGNOSIS — E781 Pure hyperglyceridemia: Secondary | ICD-10-CM | POA: Diagnosis present

## 2020-09-25 DIAGNOSIS — Z87440 Personal history of urinary (tract) infections: Secondary | ICD-10-CM

## 2020-09-25 DIAGNOSIS — Z7982 Long term (current) use of aspirin: Secondary | ICD-10-CM

## 2020-09-25 DIAGNOSIS — N1831 Chronic kidney disease, stage 3a: Secondary | ICD-10-CM | POA: Diagnosis present

## 2020-09-25 DIAGNOSIS — I2694 Multiple subsegmental pulmonary emboli without acute cor pulmonale: Secondary | ICD-10-CM | POA: Diagnosis not present

## 2020-09-25 DIAGNOSIS — R270 Ataxia, unspecified: Secondary | ICD-10-CM | POA: Diagnosis present

## 2020-09-25 DIAGNOSIS — Z8042 Family history of malignant neoplasm of prostate: Secondary | ICD-10-CM

## 2020-09-25 DIAGNOSIS — Z833 Family history of diabetes mellitus: Secondary | ICD-10-CM

## 2020-09-25 DIAGNOSIS — Z20822 Contact with and (suspected) exposure to covid-19: Secondary | ICD-10-CM | POA: Diagnosis present

## 2020-09-25 DIAGNOSIS — Z823 Family history of stroke: Secondary | ICD-10-CM

## 2020-09-25 DIAGNOSIS — Z87892 Personal history of anaphylaxis: Secondary | ICD-10-CM

## 2020-09-25 DIAGNOSIS — B450 Pulmonary cryptococcosis: Secondary | ICD-10-CM | POA: Diagnosis present

## 2020-09-25 LAB — CBC WITH DIFFERENTIAL/PLATELET
Abs Immature Granulocytes: 0 10*3/uL (ref 0.00–0.07)
Basophils Absolute: 0 10*3/uL (ref 0.0–0.1)
Basophils Relative: 1 %
Eosinophils Absolute: 0.1 10*3/uL (ref 0.0–0.5)
Eosinophils Relative: 3 %
HCT: 35.9 % — ABNORMAL LOW (ref 39.0–52.0)
Hemoglobin: 11.8 g/dL — ABNORMAL LOW (ref 13.0–17.0)
Lymphocytes Relative: 25 %
Lymphs Abs: 0.4 10*3/uL — ABNORMAL LOW (ref 0.7–4.0)
MCH: 31.6 pg (ref 26.0–34.0)
MCHC: 32.9 g/dL (ref 30.0–36.0)
MCV: 96.2 fL (ref 80.0–100.0)
Monocytes Absolute: 0.1 10*3/uL (ref 0.1–1.0)
Monocytes Relative: 5 %
Neutro Abs: 1.1 10*3/uL — ABNORMAL LOW (ref 1.7–7.7)
Neutrophils Relative %: 66 %
Platelets: 207 10*3/uL (ref 150–400)
RBC: 3.73 MIL/uL — ABNORMAL LOW (ref 4.22–5.81)
RDW: 13.3 % (ref 11.5–15.5)
WBC: 1.7 10*3/uL — ABNORMAL LOW (ref 4.0–10.5)
nRBC: 0 % (ref 0.0–0.2)
nRBC: 4 /100 WBC — ABNORMAL HIGH

## 2020-09-25 LAB — BASIC METABOLIC PANEL
Anion gap: 7 (ref 5–15)
BUN: 34 mg/dL — ABNORMAL HIGH (ref 8–23)
CO2: 20 mmol/L — ABNORMAL LOW (ref 22–32)
Calcium: 8.6 mg/dL — ABNORMAL LOW (ref 8.9–10.3)
Chloride: 108 mmol/L (ref 98–111)
Creatinine, Ser: 1.69 mg/dL — ABNORMAL HIGH (ref 0.61–1.24)
GFR, Estimated: 43 mL/min — ABNORMAL LOW (ref >60)
Glucose, Bld: 297 mg/dL — ABNORMAL HIGH (ref 70–99)
Potassium: 4.3 mmol/L (ref 3.5–5.1)
Sodium: 135 mmol/L (ref 135–145)

## 2020-09-25 LAB — HEMOGLOBIN A1C
Hgb A1c MFr Bld: 6.9 % — ABNORMAL HIGH (ref 4.8–5.6)
Mean Plasma Glucose: 151.33 mg/dL

## 2020-09-25 LAB — TROPONIN I (HIGH SENSITIVITY)
Troponin I (High Sensitivity): 18 ng/L — ABNORMAL HIGH (ref <18)
Troponin I (High Sensitivity): 23 ng/L — ABNORMAL HIGH (ref <18)

## 2020-09-25 LAB — D-DIMER, QUANTITATIVE: D-Dimer, Quant: 13.56 ug/mL-FEU — ABNORMAL HIGH (ref 0.00–0.50)

## 2020-09-25 LAB — TSH: TSH: 2.061 u[IU]/mL (ref 0.350–4.500)

## 2020-09-25 LAB — CBG MONITORING, ED: Glucose-Capillary: 270 mg/dL — ABNORMAL HIGH (ref 70–99)

## 2020-09-25 MED ORDER — HEPARIN (PORCINE) 25000 UT/250ML-% IV SOLN
1000.0000 [IU]/h | INTRAVENOUS | Status: DC
  Administered 2020-09-25: 1100 [IU]/h via INTRAVENOUS
  Administered 2020-09-26: 1000 [IU]/h via INTRAVENOUS
  Filled 2020-09-25 (×2): qty 250

## 2020-09-25 MED ORDER — IOHEXOL 350 MG/ML SOLN
50.0000 mL | Freq: Once | INTRAVENOUS | Status: AC | PRN
  Administered 2020-09-25: 50 mL via INTRAVENOUS

## 2020-09-25 MED ORDER — ONDANSETRON HCL 4 MG PO TABS
4.0000 mg | ORAL_TABLET | Freq: Four times a day (QID) | ORAL | Status: DC | PRN
Start: 2020-09-25 — End: 2020-09-28

## 2020-09-25 MED ORDER — SODIUM CHLORIDE 0.9 % IV BOLUS
1000.0000 mL | Freq: Once | INTRAVENOUS | Status: AC
  Administered 2020-09-25: 1000 mL via INTRAVENOUS

## 2020-09-25 MED ORDER — ONDANSETRON HCL 4 MG/2ML IJ SOLN: 4.0000 mg | Freq: Four times a day (QID) | INTRAMUSCULAR | Status: DC | PRN

## 2020-09-25 MED ORDER — HEPARIN BOLUS VIA INFUSION
4000.0000 [IU] | Freq: Once | INTRAVENOUS | Status: AC
  Administered 2020-09-25: 4000 [IU] via INTRAVENOUS
  Filled 2020-09-25: qty 4000

## 2020-09-25 MED ORDER — SODIUM CHLORIDE 0.9 % IV SOLN: INTRAVENOUS | Status: AC

## 2020-09-25 MED ORDER — ACETAMINOPHEN 650 MG RE SUPP: 650.0000 mg | Freq: Four times a day (QID) | RECTAL | Status: DC | PRN

## 2020-09-25 MED ORDER — INSULIN ASPART 100 UNIT/ML IJ SOLN
0.0000 [IU] | Freq: Three times a day (TID) | INTRAMUSCULAR | Status: DC
  Administered 2020-09-26: 5 [IU] via SUBCUTANEOUS
  Administered 2020-09-26: 2 [IU] via SUBCUTANEOUS
  Administered 2020-09-26: 5 [IU] via SUBCUTANEOUS
  Administered 2020-09-27: 3 [IU] via SUBCUTANEOUS
  Administered 2020-09-27 (×2): 5 [IU] via SUBCUTANEOUS
  Administered 2020-09-28: 3 [IU] via SUBCUTANEOUS

## 2020-09-25 MED ORDER — ACETAMINOPHEN 325 MG PO TABS: 650.0000 mg | ORAL_TABLET | Freq: Four times a day (QID) | ORAL | Status: DC | PRN

## 2020-09-25 NOTE — Telephone Encounter (Signed)
[name] [name] called back to let Dr. Jerilee [name] know that when he was leaving the patient's home the wife ran outside and needing him back in the house because the patient was semiconscious and throwing up yellow green stuff.  When [facility] checked his BP is was 130 or something and when the EMS checked his BP is was 90 over something.  EMS has taken the patient to the ED and the wife stated that they want to go to [facility] instead or [facility].

## 2020-09-25 NOTE — Telephone Encounter (Signed)
Maximino Greenland is calling and wanted to let the provider know that patient had a fall but patient is ok with a few bruising around the right knee. CB is [phone]

## 2020-09-25 NOTE — ED Provider Notes (Addendum)
  Physical Exam  BP (!) 107/57   Pulse 65   Temp 98.6 F (37 C) (Oral)   Resp 15   SpO2 100%   Physical Exam  ED Course/Procedures     .Critical Care Performed by: Varney Biles, MD Authorized by: Varney Biles, MD   Critical care provider statement:    Critical care time (minutes):  36   Critical care was necessary to treat or prevent imminent or life-threatening deterioration of the following conditions:  Circulatory failure   Critical care was time spent personally by me on the following activities:  Discussions with consultants, evaluation of patient's response to treatment, examination of patient, ordering and performing treatments and interventions, ordering and review of laboratory studies, ordering and review of radiographic studies, pulse oximetry, re-evaluation of patient's condition, obtaining history from patient or surrogate and review of old charts    MDM   Assuming care of patient from Dr. Tyrone Nine.   Patient in the ED for syncope and fall. Prodrome was similar to vasovagal event. 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  Workup thus far shows neutropenia.  Concerning findings are as following : neutropenia Important pending results are CT PE ordered due to elevated dimer.  According to Dr. Tyrone Nine, plan is to reassess post CT.   Patient had no complains, no concerns from the nursing side. Will continue to monitor.    Varney Biles, MD 09/25/20 1559  7:10 PM CT reveals bilateral PE and questionable appearing soft tissue mass that is new. Results discussed with the patient. Hemodynamically remained stable, no hypoxia or oxygen requirement.  No right-sided heart strain on CT scan.  Heparin started, no active bleed.   Varney Biles, MD 09/25/20 1910

## 2020-09-25 NOTE — ED Provider Notes (Signed)
[facility] EMERGENCY DEPARTMENT Provider Note   CSN: [id number] Arrival date & time: 09/25/20  1250     History Chief Complaint  Patient presents with  . Near Syncope    LAZARUS SUDBURY is a 70 y.o. male.  70 yo M with a chief complaints of a episode where he felt very lightheaded and weak.  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  He was ambulating today with physical therapy outside and states got very sweaty and weak and had to go down on his knees.  He denies losing consciousness.  Denies chest pain shortness of breath headache or neck pain.  Denies cough congestion or fever.  Feels like he has been eating and drinking normally.  He denies any trauma during this event.  Was found to be hypotensive and diaphoretic with EMS.  Had some improvement with a bolus of IV fluids in route.  Patient now feels much better.  Denies any complaints.  The history is provided by the patient.  Near Syncope This is a new problem. The current episode started 3 to 5 hours ago. The problem occurs rarely. The problem has been resolved. Pertinent negatives include no chest pain, no abdominal pain, no headaches and no shortness of breath. Nothing aggravates the symptoms. Nothing relieves the symptoms. He has tried nothing for the symptoms. The treatment provided no relief.       Past Medical History:  Diagnosis Date  . Anemia    as a younger man  . Arthritis    hands  . Cataract   . Chronic kidney disease    "starting to bother me now" (07/28/2012)  . Diabetes mellitus     type II  . Diarrhea, functional   . ED (erectile dysfunction)   . Hypertension   . Neuropathy   . Peripheral neuropathy     Patient Active Problem List   Diagnosis Date Noted  . AMS (altered mental status) 07/06/2020  . Erectile dysfunction after radical prostatectomy 11/23/2019  . Anaphylactic shock, unspecified, initial encounter 10/17/2019  . Peripheral vascular disease  ([place]) 10/17/2019  . Pain, unspecified 10/14/2019  . Pruritus, unspecified 10/14/2019  . Shortness of breath 10/14/2019  . Type 2 diabetes mellitus with diabetic neuropathy, unspecified ([place]) 10/14/2019  . Acute UTI 10/13/2019  . Delayed graft function of kidney 10/12/2019  . Postoperative fever 10/11/2019  . Immunosuppressed status ([place]) 10/09/2019  . Kidney transplanted 10/09/2019  . Prophylactic antibiotic 10/09/2019  . Status post placement of ureteral stent 10/09/2019  . Steroid-induced hyperglycemia 10/09/2019  . Allergy, unspecified, initial encounter 07/25/2019  . Hematemesis 06/14/2019  . COVID-19 virus infection 06/10/2019  . Fall at home, initial encounter 06/10/2019  . Syncope 06/10/2019  . Elevated troponin 06/10/2019  . Dependence on renal dialysis ([place]) 05/26/2018  . Cyst of kidney, acquired 11/29/2017  . Other long term (current) drug therapy 11/24/2017  . Unspecified jaundice 11/24/2017  . Elevated prostate specific antigen (PSA) 02/12/2017  . ESRD on peritoneal dialysis ([place]) 02/12/2017  . 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 Secondary hyperparathyroidism ([place]) 02/12/2017  . Malignant neoplasm of prostate ([place]) 02/08/2017  . Moderate protein-calorie malnutrition ([place]) 10/16/2016  . Iron deficiency anemia, unspecified 10/05/2016  . Hypokalemia 09/29/2016  . Anemia in chronic kidney disease 09/29/2016  . Coagulation defect, unspecified ([place]) 09/29/2016  . End stage renal disease ([place]) 09/29/2016  . Localized edema 09/29/2016  . Systemic inflammatory response syndrome (sirs) of non-infectious origin without acute  organ dysfunction ([place]) 09/29/2016  . Pure hypercholesterolemia, unspecified 09/29/2016  . Preop cardiovascular exam 04/27/2016  . Diabetes mellitus type 2, controlled ([place]) 06/21/2015  . Other hyperlipidemia 12/11/2014  . Essential hypertension 09/10/2014  . SIRS (systemic inflammatory response syndrome) ([place]) 08/01/2014  .  Bronchitis   . Sepsis ([place]) 07/31/2014  . Syncope due to orthostatic hypotension 07/28/2012  . Chronic kidney disease, stage V ([place]) 07/28/2012  . Acute kidney failure ([place]) 07/28/2012  . Benign prostatic hyperplasia without lower urinary tract symptoms 06/25/2011  . Hereditary and idiopathic peripheral neuropathy 07/20/2007    Past Surgical History:  Procedure Laterality Date  . APPENDECTOMY  1950's  . AV FISTULA PLACEMENT Right 05/14/2016   Procedure: ARTERIOVENOUS (AV) FISTULA CREATION- RIGHT ARM;  Surgeon: Serafina Mitchell, MD;  Location: [facility];  Service: Vascular;  Laterality: Right;  . COLONOSCOPY    . KIDNEY TRANSPLANT Right   . PROSTATE SURGERY    . UPPER GASTROINTESTINAL ENDOSCOPY         Family History  Problem Relation Age of Onset  . Colon polyps Mother   . Stroke Mother   . Diabetes Father   . Diabetes Cousin   . Prostate cancer Brother   . Other Daughter        kidney problems  . Esophageal cancer Maternal Aunt   . Colon cancer Neg Hx   . Stomach cancer Neg Hx   . Rectal cancer Neg Hx     Social History   Tobacco Use  . Smoking status: Former Smoker    Packs/day: 0.12    Years: 10.00    Pack years: 1.20    Types: Cigarettes    Quit date: 04/27/1986    Years since quitting: 34.4  . Smokeless tobacco: Never Used  . Tobacco comment: 07/28/2012 "quit smoking cigarettes 20-30 yr ago"  Vaping Use  . Vaping Use: Never used  Substance Use Topics  . Alcohol use: Yes    Alcohol/week: 1.0 standard drink    Types: 1 Cans of beer per week    Comment: occ  . Drug use: No    Home Medications Prior to Admission medications   Medication Sig Start Date End Date Taking? Authorizing Provider  acetaminophen (TYLENOL) 325 MG tablet Take 2 tablets (650 mg total) by mouth every 6 (six) hours as needed for mild pain (or Fever >/= 101). 07/07/20  Yes [name], MD  acyclovir (ZOVIRAX) 200 MG capsule Take 400 mg by mouth 2 (two) times daily. 09/12/20  Yes [provider]  amLODipine (NORVASC) 5 MG tablet Take 1 tablet (5 mg total) by mouth daily. 07/07/20  Yes [name], MD  aspirin 81 MG tablet Take 1 tablet (81 mg total) by mouth daily. 08/09/14  Yes Marletta Lor, MD  atovaquone [facility]) 750 MG/5ML suspension Take 1,500 mg by mouth every morning. 09/12/20  Yes [provider]  carvedilol (COREG) 6.25 MG tablet Take 6.25 mg by mouth 2 (two) times daily. 09/11/20  Yes [provider]  Cholecalciferol (VITAMIN D3) 50 MCG (2000 UT) CAPS Take 2,000 Units by mouth daily.   Yes [provider]  [employer] SENNA PLUS 8.6-50 MG tablet Take 2 tablets by mouth daily. 09/12/20  Yes [provider]  famotidine (PEPCID) 20 MG tablet Take 20 mg by mouth 2 (two) times daily. 09/12/20  Yes [provider]  fluconazole (DIFLUCAN) 200 MG tablet Take 400 mg by mouth daily. 09/23/20  Yes [provider]  folic acid (FOLVITE) 1 MG tablet Take 1 mg by mouth daily. 09/12/20  Yes [provider]  insulin aspart (NOVOLOG) 100 UNIT/ML injection Inject 0-15 Units into the skin 4 (four) times daily - after meals and at bedtime. Patient taking differently: Inject 5 Units into the skin 4 (four) times daily - after meals and at bedtime. 07/07/20  Yes [name], MD  lidocaine (LIDODERM) 5 % Place 1 patch onto the skin See admin instructions. Remove & Discard patch within 12 hours or as directed by MD   Yes [provider]  Melatonin 3 MG TBDP Take 6 mg by mouth at bedtime. 09/13/20  Yes [provider]  oxybutynin (DITROPAN-XL) 5 MG 24 hr tablet Take 5 mg by mouth daily. 06/02/20  Yes [provider]  predniSONE (DELTASONE) 5 MG tablet Take 10 mg by mouth daily with breakfast.   Yes [provider]  tacrolimus (PROGRAF) 1 MG capsule Take 1 mg by mouth 2 (two) times daily. 10/31/19  Yes [provider]  thiamine 100 MG tablet Take 100 mg by mouth daily. 09/11/20  Yes [provider]  acetaminophen (TYLENOL) 650 MG suppository Place 1 suppository (650 mg total) rectally every 6 (six) hours as needed for mild pain (or Fever >/= 101). Patient not taking: No sig reported 07/07/20   [name], MD  albuterol (VENTOLIN HFA) 108 (90 Base) MCG/ACT inhaler Inhale 2 puffs into the lungs every 6 (six) hours as needed for wheezing or shortness of breath. Patient not taking: No sig reported 06/12/19   Amin, Jeanella Flattery, MD  cefTRIAXone 1 g in sodium chloride 0.9 % 100 mL Inject 1 g into the vein daily. Patient not taking: No sig reported 07/07/20   [name], MD  enoxaparin (LOVENOX) 30 MG/0.3ML injection Inject 0.3 mLs (30 mg total) into the skin daily. Patient not taking: No sig reported 07/07/20   [name], MD  insulin glargine (LANTUS) 100 UNIT/ML injection Inject 0.1 mLs (10 Units total) into the skin daily with lunch. Patient not taking: No sig reported 07/07/20   [name], MD  labetalol (NORMODYNE) 5 MG/ML injection Inject 4 mLs (20 mg total) into the vein every 3 (three) hours as needed (SBP more than 160 or DBP more than 100). Patient not taking: No sig reported 07/07/20   [name], MD  metoprolol tartrate (LOPRESSOR) 25 MG tablet Take 0.5 tablets (12.5 mg total) by mouth 2 (two) times daily. Patient not taking: No sig reported 07/07/20   [name], MD  ondansetron (ZOFRAN) 4 MG tablet Take 1 tablet (4 mg total) by mouth every 6 (six) hours as needed for nausea. Patient not taking: No sig reported 07/07/20   [name], MD  ondansetron [facility]) 4 MG/2ML SOLN injection Inject 2 mLs (4 mg total) into the vein every 6 (six) hours as needed for nausea. Patient not taking: No sig reported 07/07/20   [name], MD  pantoprazole (PROTONIX) 40 MG tablet Take 1 tablet (40 mg total) by mouth daily. 06/16/19 06/15/20  Kayleen Memos, DO  traZODone (DESYREL) 50 MG tablet Take 0.5 tablets (25 mg total) by mouth at bedtime as needed for  sleep. Patient not taking: No sig reported 07/07/20   [name], MD  valGANciclovir (VALCYTE) 450 MG tablet Take 1 tablet (450 mg total) by mouth daily. Patient not taking: No sig reported 07/07/20   [name], MD  Allergies    Other, No known allergies, and Bag balm  [albolene]  Review of Systems   Review of Systems  Constitutional: Positive for diaphoresis. Negative for chills and fever.  HENT: Negative for congestion and facial swelling.   Eyes: Negative for discharge and visual disturbance.  Respiratory: Negative for shortness of breath.   Cardiovascular: Positive for near-syncope. Negative for chest pain and palpitations.  Gastrointestinal: Negative for abdominal pain, diarrhea and vomiting.  Musculoskeletal: Negative for arthralgias and myalgias.  Skin: Positive for wound (abrasion to right knee). Negative for color change and rash.  Neurological: Positive for syncope (near). Negative for tremors and headaches.  Psychiatric/Behavioral: Negative for confusion and dysphoric mood.    Physical Exam Updated Vital Signs BP (!) 107/57   Pulse 65   Temp 98.6 F (37 C) (Oral)   Resp 15   SpO2 100%   Physical Exam Vitals and nursing note reviewed.  Constitutional:      Appearance: He is well-developed.  HENT:     Head: Normocephalic and atraumatic.  Eyes:     Pupils: Pupils are equal, round, and reactive to light.  Neck:     Vascular: No JVD.  Cardiovascular:     Rate and Rhythm: Normal rate and regular rhythm.     Heart sounds: No murmur heard. No friction rub. No gallop.   Pulmonary:     Effort: No respiratory distress.     Breath sounds: No wheezing.  Abdominal:     General: There is no distension.     Tenderness: There is no abdominal tenderness. There is no guarding or rebound.  Musculoskeletal:        General: Normal range of motion.     Cervical back: Normal range of motion and neck supple.     Comments: Small abrasion to the right knee.     Skin:    Coloration: Skin is not pale.     Findings: No rash.  Neurological:     Mental Status: He is alert and oriented to person, place, and time.  Psychiatric:        Behavior: Behavior normal.     ED Results / Procedures / Treatments   Labs (all labs ordered are listed, but only abnormal results are displayed) Labs Reviewed  CBC WITH DIFFERENTIAL/PLATELET - Abnormal; Notable for the following components:      Result Value   WBC 1.7 (*)    RBC 3.73 (*)    Hemoglobin 11.8 (*)    HCT 35.9 (*)    Neutro Abs 1.1 (*)    Lymphs Abs 0.4 (*)    nRBC 4 (*)    All other components within normal limits  BASIC METABOLIC PANEL - Abnormal; Notable for the following components:   CO2 20 (*)    Glucose, Bld 297 (*)    BUN 34 (*)    Creatinine, Ser 1.69 (*)    Calcium 8.6 (*)    GFR, Estimated 43 (*)    All other components within normal limits  D-DIMER, QUANTITATIVE - Abnormal; Notable for the following components:   D-Dimer, Quant 13.56 (*)    All other components within normal limits  CBG MONITORING, ED - Abnormal; Notable for the following components:   Glucose-Capillary 270 (*)    All other components within normal limits  TROPONIN I (HIGH SENSITIVITY) - Abnormal; Notable for the following components:   Troponin I (High Sensitivity) 18 (*)    All other components within normal limits  TROPONIN  I (HIGH SENSITIVITY)    EKG EKG Interpretation  Date/Time:  Wednesday Sep 25 2020 12:55:08 EDT Ventricular Rate:  75 PR Interval:  122 QRS Duration: 95 QT Interval:  408 QTC Calculation: 456 R Axis:   73 Text Interpretation: Sinus rhythm Right atrial enlargement Probable left ventricular hypertrophy peaked t waves resolved Otherwise no significant change Confirmed by Deno Etienne [phone]) on 09/25/2020 2:54:10 PM   Radiology DG Chest Port 1 View  Result Date: 09/25/2020 CLINICAL DATA:  near syncope EXAM: PORTABLE CHEST 1 VIEW COMPARISON:  01/16/2020 FINDINGS: Unchanged  cardiomediastinal silhouette. There is no new focal airspace disease. There is no pleural effusion or visible pneumothorax. Skin fold overlying the left peripheral thorax. There is no acute osseous abnormality. IMPRESSION: No evidence of acute cardiopulmonary disease. Electronically Signed   By: Maurine Simmering   On: 09/25/2020 13:32    Procedures Procedures   Medications Ordered in ED Medications  sodium chloride 0.9 % bolus 1,000 mL (1,000 mLs Intravenous New Bag/Given 09/25/20 1300)    ED Course  I have reviewed the triage vital signs and the nursing notes.  Pertinent labs & imaging results that were available during my care of the patient were reviewed by me and considered in my medical decision making (see chart for details).    MDM Rules/Calculators/A&P                          70 yo M with a chief complaint of a near syncopal event.  Sounds vasovagal by history, unfortunately this did occur during exertion.  He was recently in the hospital and recently out of rehab.  We will obtain a laboratory evaluation including troponin and D-dimer.  Bolus of IV fluids.  Reassess.  Ddimer elevated, obtain PE scan. Signed out to Dr. Mariane Masters, please see his note for further details of care.   Plan for likely admission with recent multiple hospitalizations and syncope on exertion.  If patient is feeling completely better and adamant about going home that may be reasonable.  The patients results and plan were reviewed and discussed.   Any x-rays performed were independently reviewed by myself.   Differential diagnosis were considered with the presenting HPI.  Medications  sodium chloride 0.9 % bolus 1,000 mL (1,000 mLs Intravenous New Bag/Given 09/25/20 1300)    Vitals:   09/25/20 1252 09/25/20 1259 09/25/20 1415  BP:  (!) 87/61 (!) 107/57  Pulse:  72 65  Resp:  18 15  Temp:  98.6 F (37 C)   TempSrc:  Oral   SpO2: 97% 100% 100%    Final diagnoses:  Syncope and collapse       Final Clinical Impression(s) / ED Diagnoses Final diagnoses:  Syncope and collapse    Rx / DC Orders ED Discharge Orders    None       Deno Etienne, DO 09/25/20 1613

## 2020-09-25 NOTE — Progress Notes (Signed)
ANTICOAGULATION CONSULT NOTE - Initial Consult  Pharmacy Consult for heparin  Indication: pulmonary embolus  Allergies  Allergen Reactions  . Other Rash    Oxyquinoline-white Pet-lanolin  . No Known Allergies   . Bag Balm  [Albolene] Rash    Patient Measurements:   Heparin Dosing Weight: 70 KG  Vital Signs: Temp: 98.6 F (37 C) (05/11 1259) Temp Source: Oral (05/11 1259) BP: 124/69 (05/11 1730) Pulse Rate: 67 (05/11 1730)  Labs: Recent Labs    09/25/20 1257 09/25/20 1520  HGB 11.8*  --   HCT 35.9*  --   PLT 207  --   CREATININE 1.69*  --   TROPONINIHS 18* 23*    CrCl cannot be calculated (Unknown ideal weight.).   Medical History: Past Medical History:  Diagnosis Date  . Anemia    as a younger man  . Arthritis    hands  . Cataract   . Chronic kidney disease    "starting to bother me now" (07/28/2012)  . Diabetes mellitus     type II  . Diarrhea, functional   . ED (erectile dysfunction)   . Hypertension   . Neuropathy   . Peripheral neuropathy     Medications:   Assessment: 70 YOM with chief complaint of lightheadedness and weakness. 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 Patient indicates he was ambulating today with PT and became very weak and sweaty. CT PE positive for PE.  Hgb ~11 and PLT wnl.   Goal of Therapy:  Heparin level 0.3-0.7 units/ml Monitor platelets by anticoagulation protocol: Yes   Plan:  Heparin 4000 units x1 followed by 1100 units/hr  8-hour heparin level Daily HL and CBC  Monitor s/sx of bleed   Cephus Slater, PharmD, MBA Pharmacy Resident [phone] 09/25/2020 7:00 PM

## 2020-09-25 NOTE — H&P (Signed)
History and Physical    [name] [name] QVZ:[id number] DOB: Jun 18, 1950 DOA: 09/25/2020  PCP: [name], [name], MD    Patient coming from:  Home.   Chief Complaint:  Syncope.   HPI: [name] [name] is a 70 y.o. male seen in ed with complaints of Fall.Pt reports that her felt dizzy and weak and went down to the ground but did not fall or hurt himself. 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 Pt is awake , daughter at bedside and wife on phone all giving history. Pt was at home and sitting on couch after his Pt. Wife went to kitchen to get water and pt had unwitnessed fall and unresponsive with body stiff and foaming at his mouth. Pt states he felt like his head and chest were on fire.   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  Patient was also found to have hyponatremia secondary to SIADH, malnutrition, urinary retention, hypovolemic shock for which patient required vasopressor therapy at [facility].  Patient's immunosuppression regimen at [facility] on discharge was tacrolimus and prednisone.Patient also had hyponatremia secondary to SIADH that has resolved.  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  Patient also has past medical history of C. difficile diarrhea, Pseudomonas UTI, urinary retention.  ED Course:  Vitals:   09/25/20 1930 09/25/20 1945 09/25/20 2000 09/25/20 2045  BP: 136/67 131/67 124/65 134/65  Pulse: 68 67 65 70  Resp: (!) 21 16 15 17   Temp:      TempSrc:      SpO2: 100% 100% 100% 100%  Patient in the ED is alert awake oriented, and initially found to be hypotensive, current blood pressure with systolics of 329J, oxygenating well on room  air.  CMP stable with creatinine of 1.69 prior of 1.61, glucose of 297, troponin of 18 followed by repeat which is 23.  CBC shows a white count of 1.7, ANC of 1.1,, hemoglobin of 11.8, platelets of 207.  D-dimer of 13.56. Patient had CTA chest which was positive for lower lobe pulmonary emboli worse on right and left with no evidence of heart strain, 2.5 cm soft tissue mass in the posterior costophrenic angle on the left side which is new from prior CT and needs an investigation.  Review of Systems:  Review of Systems  Constitutional: Negative.   HENT: Negative.   Eyes: Negative.   Cardiovascular: Negative.   Gastrointestinal: Negative.   Neurological: Negative.  Negative for dizziness and headaches.    Past Medical History:  Diagnosis Date  . Anemia    as a younger man  . Arthritis    hands  . Cataract   . Chronic kidney disease    "starting to bother me now" (07/28/2012)  . Diabetes mellitus     type II  . Diarrhea, functional   . ED (erectile dysfunction)   . Hypertension   . Neuropathy   . Peripheral neuropathy     Past Surgical History:  Procedure Laterality Date  . APPENDECTOMY  1950's  . AV FISTULA PLACEMENT Right 05/14/2016   Procedure: ARTERIOVENOUS (AV) FISTULA CREATION- RIGHT ARM;  Surgeon: Serafina Mitchell, MD;  Location: [place];  Service: Vascular;  Laterality: Right;  . COLONOSCOPY    . KIDNEY  TRANSPLANT Right   . PROSTATE SURGERY    . UPPER GASTROINTESTINAL ENDOSCOPY       reports that he quit smoking about 34 years ago. His smoking use included cigarettes. He has a 1.20 pack-year smoking history. He has never used smokeless tobacco. He reports current alcohol use of about 1.0 standard drink of alcohol per week. He reports that he does not use drugs.  Allergies  Allergen Reactions  . Other Rash    Oxyquinoline-white Pet-lanolin  . No Known Allergies   . Bag Balm  [Albolene] Rash    Family History  Problem Relation Age of Onset  . Colon polyps Mother    . Stroke Mother   . Diabetes Father   . Diabetes Cousin   . Prostate cancer Brother   . Other Daughter        kidney problems  . Esophageal cancer Maternal Aunt   . Colon cancer Neg Hx   . Stomach cancer Neg Hx   . Rectal cancer Neg Hx     Prior to Admission medications   Medication Sig Start Date End Date Taking? Authorizing Provider  acetaminophen (TYLENOL) 325 MG tablet Take 2 tablets (650 mg total) by mouth every 6 (six) hours as needed for mild pain (or Fever >/= 101). 07/07/20  Yes [name], MD  acyclovir (ZOVIRAX) 200 MG capsule Take 400 mg by mouth 2 (two) times daily. 09/12/20  Yes [provider]  amLODipine (NORVASC) 5 MG tablet Take 1 tablet (5 mg total) by mouth daily. 07/07/20  Yes [name], MD  aspirin 81 MG tablet Take 1 tablet (81 mg total) by mouth daily. 08/09/14  Yes Marletta Lor, MD  atovaquone [facility]) 750 MG/5ML suspension Take 1,500 mg by mouth every morning. 09/12/20  Yes [provider]  carvedilol (COREG) 6.25 MG tablet Take 6.25 mg by mouth 2 (two) times daily. 09/11/20  Yes [provider]  Cholecalciferol (VITAMIN D3) 50 MCG (2000 UT) CAPS Take 2,000 Units by mouth daily.   Yes [provider]  [employer] SENNA PLUS 8.6-50 MG tablet Take 2 tablets by mouth daily. 09/12/20  Yes [provider]  famotidine (PEPCID) 20 MG tablet Take 20 mg by mouth 2 (two) times daily. 09/12/20  Yes [provider]  fluconazole (DIFLUCAN) 200 MG tablet Take 400 mg by mouth daily. 09/23/20  Yes [provider]  folic acid (FOLVITE) 1 MG tablet Take 1 mg by mouth daily. 09/12/20  Yes [provider]  insulin aspart (NOVOLOG) 100 UNIT/ML injection Inject 0-15 Units into the skin 4 (four) times daily - after meals and at bedtime. Patient taking differently: Inject 5 Units into the skin 4 (four) times daily - after meals and at bedtime. 07/07/20  Yes [name], MD  lidocaine (LIDODERM) 5 % Place 1 patch  onto the skin See admin instructions. Remove & Discard patch within 12 hours or as directed by MD   Yes [provider]  Melatonin 3 MG TBDP Take 6 mg by mouth at bedtime. 09/13/20  Yes [provider]  oxybutynin (DITROPAN-XL) 5 MG 24 hr tablet Take 5 mg by mouth daily. 06/02/20  Yes [provider]  predniSONE (DELTASONE) 5 MG tablet Take 10 mg by mouth daily with breakfast.   Yes [provider]  tacrolimus (PROGRAF) 1 MG capsule Take 1 mg by mouth 2 (two) times daily. 10/31/19  Yes [provider]  thiamine 100 MG tablet Take 100 mg by  mouth daily. 09/11/20  Yes [provider]  acetaminophen (TYLENOL) 650 MG suppository Place 1 suppository (650 mg total) rectally every 6 (six) hours as needed for mild pain (or Fever >/= 101). Patient not taking: No sig reported 07/07/20   [name], MD  albuterol (VENTOLIN HFA) 108 (90 Base) MCG/ACT inhaler Inhale 2 puffs into the lungs every 6 (six) hours as needed for wheezing or shortness of breath. Patient not taking: No sig reported 06/12/19   Amin, Jeanella Flattery, MD  cefTRIAXone 1 g in sodium chloride 0.9 % 100 mL Inject 1 g into the vein daily. Patient not taking: No sig reported 07/07/20   [name], MD  enoxaparin (LOVENOX) 30 MG/0.3ML injection Inject 0.3 mLs (30 mg total) into the skin daily. Patient not taking: No sig reported 07/07/20   [name], MD  insulin glargine (LANTUS) 100 UNIT/ML injection Inject 0.1 mLs (10 Units total) into the skin daily with lunch. Patient not taking: No sig reported 07/07/20   [name], MD  labetalol (NORMODYNE) 5 MG/ML injection Inject 4 mLs (20 mg total) into the vein every 3 (three) hours as needed (SBP more than 160 or DBP more than 100). Patient not taking: No sig reported 07/07/20   [name], MD  metoprolol tartrate (LOPRESSOR) 25 MG tablet Take 0.5 tablets (12.5 mg total) by mouth 2 (two) times daily. Patient not taking: No sig  reported 07/07/20   [name], MD  ondansetron (ZOFRAN) 4 MG tablet Take 1 tablet (4 mg total) by mouth every 6 (six) hours as needed for nausea. Patient not taking: No sig reported 07/07/20   [name], MD  ondansetron [facility]) 4 MG/2ML SOLN injection Inject 2 mLs (4 mg total) into the vein every 6 (six) hours as needed for nausea. Patient not taking: No sig reported 07/07/20   [name], MD  pantoprazole (PROTONIX) 40 MG tablet Take 1 tablet (40 mg total) by mouth daily. 06/16/19 06/15/20  Kayleen Memos, DO  traZODone (DESYREL) 50 MG tablet Take 0.5 tablets (25 mg total) by mouth at bedtime as needed for sleep. Patient not taking: No sig reported 07/07/20   [name], MD  valGANciclovir (VALCYTE) 450 MG tablet Take 1 tablet (450 mg total) by mouth daily. Patient not taking: No sig reported 07/07/20   [name], MD    Physical Exam: Vitals:   09/25/20 1930 09/25/20 1945 09/25/20 2000 09/25/20 2045  BP: 136/67 131/67 124/65 134/65  Pulse: 68 67 65 70  Resp: (!) 21 16 15 17   Temp:      TempSrc:      SpO2: 100% 100% 100% 100%   Physical Exam Vitals and nursing note reviewed.  Constitutional:      General: He is not in acute distress.    Appearance: Normal appearance. He is not ill-appearing, toxic-appearing or diaphoretic.  HENT:     Head: Normocephalic and atraumatic.     Right Ear: External ear normal.     Left Ear: External ear normal.     Mouth/Throat:     Mouth: Mucous membranes are moist.  Eyes:     Extraocular Movements: Extraocular movements intact.  Cardiovascular:     Rate and Rhythm: Normal rate and regular rhythm.     Pulses: Normal pulses.     Heart sounds: Normal heart sounds.  Pulmonary:     Effort: Pulmonary effort is normal.     Breath sounds: Normal  breath sounds.  Abdominal:     General: Bowel sounds are normal. There is no distension.     Palpations: Abdomen is soft.     Tenderness: There is no abdominal tenderness. There is no  guarding.  Musculoskeletal:     Right lower leg: No edema.     Left lower leg: No edema.  Skin:    General: Skin is warm.  Neurological:     General: No focal deficit present.     Mental Status: He is alert and oriented to person, place, and time.  Psychiatric:        Mood and Affect: Mood normal.        Behavior: Behavior normal.      Labs on Admission: I have personally reviewed following labs and imaging studies  No results for input(s): CKTOTAL, CKMB, TROPONINI in the last 72 hours. Lab Results  Component Value Date   WBC 1.7 (L) 09/25/2020   HGB 11.8 (L) 09/25/2020   HCT 35.9 (L) 09/25/2020   MCV 96.2 09/25/2020   PLT 207 09/25/2020    Recent Labs  Lab 09/25/20 1257  NA 135  K 4.3  CL 108  CO2 20*  BUN 34*  CREATININE 1.69*  CALCIUM 8.6*  GLUCOSE 297*   Lab Results  Component Value Date   CHOL 252 (H) 05/22/2015   HDL 58.70 05/22/2015   LDLCALC 154 (H) 05/22/2015   TRIG 223 (H) 06/10/2019   Lab Results  Component Value Date   DDIMER 13.56 (H) 09/25/2020   Urinalysis    Component Value Date/Time   COLORURINE YELLOW 07/05/2020 2233   APPEARANCEUR HAZY (A) 07/05/2020 2233   LABSPEC 1.014 07/05/2020 2233   PHURINE 5.0 07/05/2020 2233   GLUCOSEU NEGATIVE 07/05/2020 2233   HGBUR SMALL (A) 07/05/2020 2233   HGBUR trace-lysed 06/16/2010 0833   BILIRUBINUR NEGATIVE 07/05/2020 2233   BILIRUBINUR n 05/22/2015 0927   KETONESUR 5 (A) 07/05/2020 2233   PROTEINUR 30 (A) 07/05/2020 2233   UROBILINOGEN 0.2 05/22/2015 0927   UROBILINOGEN 0.2 07/31/2014 2130   NITRITE NEGATIVE 07/05/2020 2233   LEUKOCYTESUR LARGE (A) 07/05/2020 2233   Recent Labs    09/25/20 1257  DDIMER 13.56*    Lab Results  Component Value Date   SARSCOV2NAA NEGATIVE 07/07/2020   SARSCOV2NAA NEGATIVE 07/06/2020   [place] NEGATIVE 01/16/2020   SARSCOV2NAA Detected (A) 06/07/2019    Radiological Exams on Admission: CT Angio Chest PE W and/or Wo Contrast  Result Date:  09/25/2020 CLINICAL DATA:  Near syncopal episode EXAM: CT ANGIOGRAPHY CHEST WITH CONTRAST TECHNIQUE: Multidetector CT imaging of the chest was performed using the standard protocol during bolus administration of intravenous contrast. Multiplanar CT image reconstructions and MIPs were obtained to evaluate the vascular anatomy. CONTRAST:  39mL OMNIPAQUE IOHEXOL 350 MG/ML SOLN COMPARISON:  Chest x-ray from earlier in the same day, CT from 01/16/2020. FINDINGS: Cardiovascular: Thoracic aorta demonstrates atherosclerotic calcifications without aneurysmal dilatation or dissection. Heart is at the upper limits of normal in size. Pulmonary artery demonstrates a normal branching pattern bilaterally. There are filling defects identified primarily within the lower lobe branches on the right and 2 Eloesser degree in the lower lobe branches on the left. No definitive right heart strain is noted. No coronary calcifications are seen. Mediastinum/Nodes: Thoracic inlet is within normal limits. The esophagus as visualized is within normal limits. Small left hilar and prevascular nodes are seen measuring less than 1 cm in diameter. Lungs/Pleura: Lungs are well aerated bilaterally. There is a  2.5 cm rounded density identified in the left lower lobe laterally. Small associated hilar lymph nodes are seen. The lungs are otherwise well aerated without focal infiltrate or sizable effusion. Upper Abdomen: Visualized upper abdomen shows no acute abnormality. Musculoskeletal: No chest wall abnormality. No acute or significant osseous findings. Review of the MIP images confirms the above findings. IMPRESSION: Findings consistent with lower lobe pulmonary emboli worse on the right than the left without evidence of right heart strain. 2.5 cm soft tissue mass lesion in the posterior costophrenic angle on the left new from a prior CT examination obtained in August of 2021. Neoplasm deserves consideration and further workup is recommended. Aortic  Atherosclerosis (ICD10-I70.0). Critical Value/emergent results were called by telephone at the time of interpretation on 09/25/2020 at 6:22 pm to Dr. Kathrynn Humble, who verbally acknowledged these results. Electronically Signed   By: Inez Catalina M.D.   On: 09/25/2020 18:31   DG Chest Port 1 View  Result Date: 09/25/2020 CLINICAL DATA:  near syncope EXAM: PORTABLE CHEST 1 VIEW COMPARISON:  01/16/2020 FINDINGS: Unchanged cardiomediastinal silhouette. There is no new focal airspace disease. There is no pleural effusion or visible pneumothorax. Skin fold overlying the left peripheral thorax. There is no acute osseous abnormality. IMPRESSION: No evidence of acute cardiopulmonary disease. Electronically Signed   By: Maurine Simmering   On: 09/25/2020 13:32    EKG: Independently reviewed.  This rhythm 75, right atrial enlargement, left ventricular hypertrophy, normal QTc 456.  Echocardiogram 02/26/2017: Study Conclusions: - Left ventricle: The cavity size was normal. Wall thickness was  increased in a pattern of mild LVH. Systolic function was normal.  The estimated ejection fraction was in the range of 55% to 60%.  LV apical false tendon. Wall motion was normal; there were no  regional wall motion abnormalities. Doppler parameters are  consistent with abnormal left ventricular relaxation (grade 1  diastolic dysfunction). The E/e&' ratio is between 8-15,  suggesting indeterminate LV filling pressure.  - Mitral valve: Mildly thickened leaflets . There was trivial  regurgitation.  - Left atrium: The atrium was normal in size.  - Inferior vena cava: The vessel was normal in size. The  respirophasic diameter changes were in the normal range (>= 50%),  consistent with normal central venous pressure.   Assessment/Plan Principal Problem:   Syncope due to orthostatic hypotension Active Problems:   Pulmonary embolism ([name])   Diabetes mellitus type 2, controlled ([name])   Iron deficiency  anemia, unspecified   Syncope due to orthostatic hypotension: Patient presenting with syncope episode second time today first time was on Saturday and resolved today was worse or patient became unresponsive and EMS was called at home.  On arrival patient's blood pressure was found to be low.  We will therefore admit patient to telemetry unit and evaluate for TIA/CVA/cardiogenic causes as well. In the emergency room patient also found to have bilateral pulmonary embolism without heart strain which can also be a reasoning for her syncope. Orthostatic vitals, neurochecks, 2D echo, MRI of the brain and carotid Dopplers. Fall precautions, aspiration precautions, physical therapy evaluation for gait and safety.    Pulmonary embolism: Bilateral pulmonary embolism on CTA, no heart strain noted on CT.   However with syncope I suspect patient may have heart strain.  We will obtain 2D echocardiogram with bubble study. Patient continued on heparin drip for PE protocol and pharmacy consulted.  Diabetes mellitus type 2: Diabetes is uncontrolled patient's A1c was 9.8 in February.   Will start patient on  a carb consistent cardiac diet.   Sliding scale coverage moderate level, hypoglycemia protocol and Accu-Cheks.  Iron deficiency anemia: Patient currently on anticoagulation therapy for bilateral PE.   Will obtain anemia panel, stool guaiac. IV PPI therapy.  Type and screen, transfuse if and when hemoglobin goes below 7.    Leukopenia: Patient's white count is 1.7, ANC is 1.1. Consider hematology consult.  For PE, anemia, leukopenia.  Kidney transplant (09/2019): Patient to continue tacrolimus and have levels checked Mondays and Thursdays, patient also needs monitoring of his levels because patient is on fluconazole.  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  Patient is also being followed by ID.  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     DVT prophylaxis:  SCD's/heparin drip.  Code Status:  Full Code  Family Communication:  Zeitz,Phenonda (Spouse)  [phone] (Mobile)  Disposition Plan:  TBD   Consults called:  None   Admission status: Inpatient     Para Skeans MD [facility] [phone] How to contact the [facility] Attending or Consulting provider [place] or covering provider during after hours [place], for this patient.    1. Check the care team in [facility] and look for a) attending/consulting [place] provider listed and b) the [facility] team listed 2. Log into [email or web address] and use [place]'s universal password to access. If you do not have the password, please contact the hospital operator. 3. Locate the [facility] provider you are looking for under [facility] and page to a number that you can be directly reached. 4. If you still have difficulty reaching the provider, please page the [facility] (Director on Call) for the Hospitalists listed on amion for assistance. [email or web address] Password [facility] 09/25/2020, 10:19 PM

## 2020-09-25 NOTE — ED Triage Notes (Signed)
BIB [facility] after home health nurse called to report that pt was diaphoretic, light headed, and episodes of vomiting while doing PT post hospitalization for meningitis x 2 week. PT given 700 cc NS enroute to hospital and 4 mg Zofran. Pt has hx kidney transplant, DM.   CBG 270 B/P 87/61 HR 75

## 2020-09-26 ENCOUNTER — Inpatient Hospital Stay (HOSPITAL_COMMUNITY): Payer: Medicare Other

## 2020-09-26 ENCOUNTER — Other Ambulatory Visit: Payer: Self-pay

## 2020-09-26 ENCOUNTER — Encounter (HOSPITAL_COMMUNITY): Payer: Self-pay | Admitting: Internal Medicine

## 2020-09-26 ENCOUNTER — Inpatient Hospital Stay (HOSPITAL_COMMUNITY): Payer: BLUE CROSS/BLUE SHIELD

## 2020-09-26 DIAGNOSIS — I951 Orthostatic hypotension: Secondary | ICD-10-CM | POA: Diagnosis not present

## 2020-09-26 DIAGNOSIS — R9389 Abnormal findings on diagnostic imaging of other specified body structures: Secondary | ICD-10-CM | POA: Diagnosis not present

## 2020-09-26 DIAGNOSIS — G9340 Encephalopathy, unspecified: Secondary | ICD-10-CM

## 2020-09-26 DIAGNOSIS — I2694 Multiple subsegmental pulmonary emboli without acute cor pulmonale: Secondary | ICD-10-CM

## 2020-09-26 DIAGNOSIS — R55 Syncope and collapse: Secondary | ICD-10-CM

## 2020-09-26 LAB — CBC
HCT: 33.6 % — ABNORMAL LOW (ref 39.0–52.0)
Hemoglobin: 11.3 g/dL — ABNORMAL LOW (ref 13.0–17.0)
MCH: 32.1 pg (ref 26.0–34.0)
MCHC: 33.6 g/dL (ref 30.0–36.0)
MCV: 95.5 fL (ref 80.0–100.0)
Platelets: 164 10*3/uL (ref 150–400)
RBC: 3.52 MIL/uL — ABNORMAL LOW (ref 4.22–5.81)
RDW: 13.4 % (ref 11.5–15.5)
WBC: 3.6 10*3/uL — ABNORMAL LOW (ref 4.0–10.5)
nRBC: 0 % (ref 0.0–0.2)

## 2020-09-26 LAB — IRON AND TIBC
Iron: 43 ug/dL — ABNORMAL LOW (ref 45–182)
Saturation Ratios: 20 % (ref 17.9–39.5)
TIBC: 211 ug/dL — ABNORMAL LOW (ref 250–450)
UIBC: 168 ug/dL

## 2020-09-26 LAB — FERRITIN: Ferritin: 197 ng/mL (ref 24–336)

## 2020-09-26 LAB — HEPARIN LEVEL (UNFRACTIONATED)
Heparin Unfractionated: 0.8 IU/mL — ABNORMAL HIGH (ref 0.30–0.70)
Heparin Unfractionated: 0.98 IU/mL — ABNORMAL HIGH (ref 0.30–0.70)

## 2020-09-26 LAB — GLUCOSE, CAPILLARY
Glucose-Capillary: 245 mg/dL — ABNORMAL HIGH (ref 70–99)
Glucose-Capillary: 207 mg/dL — ABNORMAL HIGH (ref 70–99)
Glucose-Capillary: 123 mg/dL — ABNORMAL HIGH (ref 70–99)

## 2020-09-26 LAB — RETICULOCYTES
Immature Retic Fract: 9.5 % (ref 2.3–15.9)
RBC.: 3.49 MIL/uL — ABNORMAL LOW (ref 4.22–5.81)
Retic Count, Absolute: 59 10*3/uL (ref 19.0–186.0)
Retic Ct Pct: 1.7 % (ref 0.4–3.1)

## 2020-09-26 LAB — TYPE AND SCREEN
ABO/RH(D): AB POS
Antibody Screen: NEGATIVE

## 2020-09-26 LAB — RESP PANEL BY RT-PCR (FLU A&B, COVID) ARPGX2
Influenza A by PCR: NEGATIVE
Influenza B by PCR: NEGATIVE
SARS Coronavirus 2 by RT PCR: NEGATIVE

## 2020-09-26 LAB — VITAMIN B12: Vitamin B-12: 1206 pg/mL — ABNORMAL HIGH (ref 180–914)

## 2020-09-26 LAB — FOLATE: Folate: 37.5 ng/mL (ref >5.9)

## 2020-09-26 LAB — T4, FREE: Free T4: 0.75 ng/dL (ref 0.61–1.12)

## 2020-09-26 MED ORDER — FOLIC ACID 1 MG PO TABS
1.0000 mg | ORAL_TABLET | Freq: Every day | ORAL | Status: DC
  Administered 2020-09-26 – 2020-09-28 (×3): 1 mg via ORAL
  Filled 2020-09-26 (×3): qty 1

## 2020-09-26 MED ORDER — HEPARIN (PORCINE) 25000 UT/250ML-% IV SOLN: 850.0000 [IU]/h | INTRAVENOUS | Status: DC

## 2020-09-26 MED ORDER — ACYCLOVIR 200 MG PO CAPS
400.0000 mg | ORAL_CAPSULE | Freq: Two times a day (BID) | ORAL | Status: DC
  Administered 2020-09-26 – 2020-09-28 (×4): 400 mg via ORAL
  Filled 2020-09-26 (×4): qty 2

## 2020-09-26 MED ORDER — SENNOSIDES-DOCUSATE SODIUM 8.6-50 MG PO TABS
2.0000 | ORAL_TABLET | Freq: Every day | ORAL | Status: DC
  Administered 2020-09-26 – 2020-09-27 (×2): 2 via ORAL
  Filled 2020-09-26 (×2): qty 2

## 2020-09-26 MED ORDER — PREDNISONE 10 MG PO TABS
10.0000 mg | ORAL_TABLET | Freq: Every day | ORAL | Status: DC
  Administered 2020-09-26 – 2020-09-28 (×3): 10 mg via ORAL
  Filled 2020-09-26 (×3): qty 1

## 2020-09-26 MED ORDER — THIAMINE HCL 100 MG PO TABS
100.0000 mg | ORAL_TABLET | Freq: Every day | ORAL | Status: DC
  Administered 2020-09-26 – 2020-09-28 (×3): 100 mg via ORAL
  Filled 2020-09-26 (×3): qty 1

## 2020-09-26 MED ORDER — ATOVAQUONE 750 MG/5ML PO SUSP
1500.0000 mg | Freq: Every morning | ORAL | Status: DC
  Administered 2020-09-26 – 2020-09-28 (×3): 1500 mg via ORAL
  Filled 2020-09-26 (×4): qty 10

## 2020-09-26 MED ORDER — VITAMIN D 25 MCG (1000 UNIT) PO TABS
2000.0000 [IU] | ORAL_TABLET | Freq: Every day | ORAL | Status: DC
  Administered 2020-09-26 – 2020-09-28 (×3): 2000 [IU] via ORAL
  Filled 2020-09-26 (×3): qty 2

## 2020-09-26 MED ORDER — FLUCONAZOLE 100 MG PO TABS
400.0000 mg | ORAL_TABLET | Freq: Every day | ORAL | Status: DC
  Administered 2020-09-26 – 2020-09-28 (×3): 400 mg via ORAL
  Filled 2020-09-26 (×3): qty 4

## 2020-09-26 MED ORDER — TACROLIMUS 1 MG PO CAPS
1.0000 mg | ORAL_CAPSULE | Freq: Two times a day (BID) | ORAL | Status: DC
  Administered 2020-09-26 – 2020-09-28 (×4): 1 mg via ORAL
  Filled 2020-09-26 (×4): qty 1

## 2020-09-26 MED ORDER — ENOXAPARIN SODIUM 80 MG/0.8ML IJ SOSY
70.0000 mg | PREFILLED_SYRINGE | INTRAMUSCULAR | Status: AC
  Administered 2020-09-26: 70 mg via SUBCUTANEOUS
  Filled 2020-09-26: qty 0.8

## 2020-09-26 MED ORDER — OXYBUTYNIN CHLORIDE ER 5 MG PO TB24
5.0000 mg | ORAL_TABLET | Freq: Every day | ORAL | Status: DC
  Administered 2020-09-26 – 2020-09-28 (×3): 5 mg via ORAL
  Filled 2020-09-26 (×3): qty 1

## 2020-09-26 MED ORDER — MELATONIN 3 MG PO TABS
6.0000 mg | ORAL_TABLET | Freq: Every day | ORAL | Status: DC
  Administered 2020-09-26 – 2020-09-27 (×2): 6 mg via ORAL
  Filled 2020-09-26 (×2): qty 2

## 2020-09-26 MED ORDER — FAMOTIDINE 20 MG PO TABS
20.0000 mg | ORAL_TABLET | Freq: Every day | ORAL | Status: DC
  Administered 2020-09-26 – 2020-09-28 (×3): 20 mg via ORAL
  Filled 2020-09-26 (×3): qty 1

## 2020-09-26 MED ORDER — ENOXAPARIN SODIUM 80 MG/0.8ML IJ SOSY
70.0000 mg | PREFILLED_SYRINGE | Freq: Two times a day (BID) | INTRAMUSCULAR | Status: DC
  Administered 2020-09-27 – 2020-09-28 (×3): 70 mg via SUBCUTANEOUS
  Filled 2020-09-26 (×3): qty 0.8

## 2020-09-26 NOTE — ED Notes (Signed)
Report called to [facility], RN receiving pt.

## 2020-09-26 NOTE — Progress Notes (Addendum)
PROGRESS NOTE  [name] [name]  PIR:[id number] DOB: 1950/12/20 DOA: 09/25/2020 PCP: [name], [name], MD   Brief Narrative: [name] [name] is a 70 y.o. 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 He had felt weak while working at home with PT, had slow fall to his knees on the driveway, subsequently came inside to the couch where his wife describes an episode 4-5 minutes long of abrupt eyes rolling in back of head and rigidity with frothing at the mouth. He did not lose postural tone during this but was unresponsive. Afterwards he was poorly responsive, EMS called and he had another similar episode, associated with hypotension per EMS.   In the ED he was diagnosed with bibasilar R > L pulmonary emboli without hypoxia or pleuritic chest pain, a 2.4 cm left base pulmonary nodule, and subsequent MRI revealed acute right inferior cerebellar stroke felt to be causing mild left-sided ataxia. Further work up is underway.   Assessment & Plan: Principal Problem:   Syncope due to orthostatic hypotension Active Problems:   Diabetes mellitus type 2, controlled ([name])   Iron deficiency anemia, unspecified   Pulmonary embolism ([name])  Syncope, hypotension, seizure-like activity: - Based on wife's description of tonic activity, will check EEG. Note seizure/neurotoxicity can be associated with tacrolimus supratherapeutic levels, though this would be less likely in the absence of hypertension and renal dysfunction.  - Recheck orthostatics - Continue cardiac monitoring  Fall, deconditioning:  - Continue PT/OT to maintain strength  Acute bilateral PE: ?if provoked by relative immobility and/or undiagnosed malignancy (pulm. nodule).  - Convert heparin gtt to lovenox with eventual plans for DOAC  (eliquis preferred over xarelto with concomitant fluconazole Tx).  - Echocardiogram pending  Pulmonary nodule: At left posterior costophrenic angle not noted on CT A/P from Aug 2021 but a 2.6 x 3.1 cm masslike opacity within the left lower lobe was noted in radiology report of CT chest perform at [facility] 07/15/2020 was noted. Biopsy on 3/2 was said to show cryptococcus.   - Pulmonary consulted nonurgently for their input.  History of cryptococcal meningitis:  - Continue fluconazole 400mg  daily, with plan for 1 year of this followed by 200mg  dosing beginning 08/03/2021.  Acute right inferior cerebellar ischemic infarct: With mild left sided ataxia, not cause for syncope.  - Pt previously on ASA 81mg  daily, now on anticoagulation. - Neurology consulted. Work up continuing including MRA head - Will need statin, check LDL - Neurochecks - PT/OT/SLP  Stage IIIa CKD (based on available lab values), history of ESRD now s/p renal transplant:  - Continue tacrolimus 1 BID and prednisone 10mg  daily. Also continuing atovaquone PJP ppx, acyclovir HSV ppx.  - Tacrolimus level recently decreased after level was 14.6 (typical goal for renal transplant patients at trough is 3 to 7 ng/mL), will recheck level next week (either as inpatient or outpatient). - Avoid nephrotoxins  IDT2DM: HbA1c 6.9% indicates chronic CBG at goal.  - SSI AC, carb-mod diet  Also notes history of pseudomonas UTI, C. difficile PCR positive, toxin negative, and intermittent urinary retention.  - History of bladder spasms: Continue home oxybutynin. Monitor closely for retention.  - Monitor for infection. 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   Leukopenia:  - Improving spontaneously, continue monitoring.  DVT prophylaxis: Heparin gtt > lovenox Code  Status: Full Family Communication: Wife at bedside on PM rounds Disposition Plan:  Status is: Inpatient  Remains inpatient appropriate because:Ongoing diagnostic testing needed not appropriate for outpatient work up  Dispo: The patient is from: Home              Anticipated d/c is to: Home              Patient currently is not medically stable to d/c.   Difficult to place patient No  Consultants:   Neurology  Pulmonology  Procedures:   None  Antimicrobials:  None   Subjective: PT pleasantly conversant and oriented this morning denies focal weakness or numbness or clumsiness. No chest pain no dyspnea. Recalls events as above and states it felt like there was pepper in his mouth and on his head during the time. No other new complaints. No fever or recent urinary symptoms.  Objective: Vitals:   09/26/20 0745 09/26/20 0900 09/26/20 1154 09/26/20 1400  BP:  120/65 137/65   Pulse:  66 77   Resp:  14 13   Temp:  98.2 F (36.8 C) 98.7 F (37.1 C)   TempSrc:  Oral Oral   SpO2: 100% 100% 100%   Weight:    69.4 kg    Intake/Output Summary (Last 24 hours) at 09/26/2020 1452 Last data filed at 09/26/2020 0600 Gross per 24 hour  Intake 4668.52 ml  Output --  Net 4668.52 ml   Filed Weights   09/26/20 1400  Weight: 69.4 kg    Gen: 70 y.o. male in no distress Pulm: Non-labored breathing room air. Clear to auscultation bilaterally.  CV: Regular rate and rhythm. No murmur, rub, or gallop. No JVD, no pedal edema. GI: Abdomen soft, non-tender, non-distended, with normoactive bowel sounds. No organomegaly or masses felt. Ext: Warm, no deformities Skin: No rashes, lesions or ulcers on visualized skin Neuro: Alert and oriented. Subtle apraxia of LUE Psych: Judgement and insight appear normal. Mood & affect appropriate.   Data Reviewed: I have personally reviewed following labs and imaging studies  CBC: Recent Labs  Lab 09/25/20 1257 09/26/20 0234  WBC 1.7* 3.6*   NEUTROABS 1.1*  --   HGB 11.8* 11.3*  HCT 35.9* 33.6*  MCV 96.2 95.5  PLT 207 696   Basic Metabolic Panel: Recent Labs  Lab 09/25/20 1257  NA 135  K 4.3  CL 108  CO2 20*  GLUCOSE 297*  BUN 34*  CREATININE 1.69*  CALCIUM 8.6*   GFR: Estimated Creatinine Clearance: 38.8 mL/min (A) (by C-G formula based on SCr of 1.69 mg/dL (H)). Liver Function Tests: No results for input(s): AST, ALT, ALKPHOS, BILITOT, PROT, ALBUMIN in the last 168 hours. No results for input(s): LIPASE, AMYLASE in the last 168 hours. No results for input(s): AMMONIA in the last 168 hours. Coagulation Profile: No results for input(s): INR, PROTIME in the last 168 hours. Cardiac Enzymes: No results for input(s): CKTOTAL, CKMB, CKMBINDEX, TROPONINI in the last 168 hours. BNP (last 3 results) No results for input(s): PROBNP in the last 8760 hours. HbA1C: Recent Labs    09/25/20 2230  HGBA1C 6.9*   CBG: Recent Labs  Lab 09/25/20 1255 09/26/20 0912 09/26/20 1239  GLUCAP 270* 245* 207*   Lipid Profile: No results for input(s): CHOL, HDL, LDLCALC, TRIG, CHOLHDL, LDLDIRECT in the last 72 hours.  Thyroid Function Tests: Recent Labs    09/25/20 2230 09/26/20 0101  TSH 2.061  --   FREET4  --  0.75   Anemia Panel: Recent Labs    09/26/20 0101  VITAMINB12 1,206*  FOLATE 37.5  FERRITIN 197  TIBC 211*  IRON 43*  RETICCTPCT 1.7   Urine analysis:    Component Value Date/Time   COLORURINE YELLOW 07/05/2020 2233   APPEARANCEUR HAZY (A) 07/05/2020 2233   LABSPEC 1.014 07/05/2020 2233   PHURINE 5.0 07/05/2020 2233   GLUCOSEU NEGATIVE 07/05/2020 2233   HGBUR SMALL (A) 07/05/2020 2233   HGBUR trace-lysed 06/16/2010 0833   BILIRUBINUR NEGATIVE 07/05/2020 2233   BILIRUBINUR n 05/22/2015 0927   KETONESUR 5 (A) 07/05/2020 2233   PROTEINUR 30 (A) 07/05/2020 2233   UROBILINOGEN 0.2 05/22/2015 0927   UROBILINOGEN 0.2 07/31/2014 2130   NITRITE NEGATIVE 07/05/2020 2233   LEUKOCYTESUR LARGE (A)  07/05/2020 2233   Recent Results (from the past 240 hour(s))  Resp Panel by RT-PCR (Flu A&B, Covid) Nasopharyngeal Swab     Status: None   Collection Time: 09/25/20 11:43 PM   Specimen: Nasopharyngeal Swab; Nasopharyngeal(NP) swabs in vial transport medium  Result Value Ref Range Status   SARS Coronavirus 2 by RT PCR NEGATIVE NEGATIVE Final    Comment: (NOTE) SARS-CoV-2 target nucleic acids are NOT DETECTED.  The SARS-CoV-2 RNA is generally detectable in upper respiratory specimens during the acute phase of infection. The lowest concentration of SARS-CoV-2 viral copies this assay can detect is 138 copies/mL. A negative result does not preclude SARS-Cov-2 infection and should not be used as the sole basis for treatment or other patient management decisions. A negative result may occur with  improper specimen collection/handling, submission of specimen other than nasopharyngeal swab, presence of viral mutation(s) within the areas targeted by this assay, and inadequate number of viral copies(<138 copies/mL). A negative result must be combined with clinical observations, patient history, and epidemiological information. The expected result is Negative.  Fact Sheet for Patients:  EntrepreneurPulse.com.au  Fact Sheet for Healthcare Providers:  IncredibleEmployment.be  This test is no t yet approved or cleared by the [place] FDA and  has been authorized for detection and/or diagnosis of SARS-CoV-2 by FDA under an Emergency Use Authorization (EUA). This EUA will remain  in effect (meaning this test can be used) for the duration of the COVID-19 declaration under Section 564(b)(1) of the Act, 21 U.S.C.section 360bbb-3(b)(1), unless the authorization is terminated  or revoked sooner.       Influenza A by PCR NEGATIVE NEGATIVE Final   Influenza B by PCR NEGATIVE NEGATIVE Final    Comment: (NOTE) The Xpert Xpress SARS-CoV-2/FLU/RSV plus assay is  intended as an aid in the diagnosis of influenza from Nasopharyngeal swab specimens and should not be used as a sole basis for treatment. Nasal washings and aspirates are unacceptable for Xpert Xpress SARS-CoV-2/FLU/RSV testing.  Fact Sheet for Patients: EntrepreneurPulse.com.au  Fact Sheet for Healthcare Providers: IncredibleEmployment.be  This test is not yet approved or cleared by the [place] FDA and has been authorized for detection and/or diagnosis of SARS-CoV-2 by FDA under an Emergency Use Authorization (EUA). This EUA will remain in effect (meaning this test can be used) for the duration of the COVID-19 declaration under Section 564(b)(1) of the Act, 21 U.S.C. section 360bbb-3(b)(1), unless the authorization is terminated or revoked.  Performed at [facility], [place] [address] [place], [place] [address]       Radiology Studies:  CT Angio Chest PE W and/or Wo Contrast  Result Date: 09/25/2020 CLINICAL DATA:  Near syncopal episode EXAM: CT ANGIOGRAPHY CHEST WITH CONTRAST TECHNIQUE: Multidetector CT imaging of the chest was performed using the standard protocol during bolus administration of intravenous contrast. Multiplanar CT image reconstructions and MIPs were obtained to evaluate the vascular anatomy. CONTRAST:  82mL OMNIPAQUE IOHEXOL 350 MG/ML SOLN COMPARISON:  Chest x-ray from earlier in the same day, CT from 01/16/2020. FINDINGS: Cardiovascular: Thoracic aorta demonstrates atherosclerotic calcifications without aneurysmal dilatation or dissection. Heart is at the upper limits of normal in size. Pulmonary artery demonstrates a normal branching pattern bilaterally. There are filling defects identified primarily within the lower lobe branches on the right and 2 Eloesser degree in the lower lobe branches on the left. No definitive right heart strain is noted. No coronary calcifications are seen. Mediastinum/Nodes: Thoracic inlet is  within normal limits. The esophagus as visualized is within normal limits. Small left hilar and prevascular nodes are seen measuring less than 1 cm in diameter. Lungs/Pleura: Lungs are well aerated bilaterally. There is a 2.5 cm rounded density identified in the left lower lobe laterally. Small associated hilar lymph nodes are seen. The lungs are otherwise well aerated without focal infiltrate or sizable effusion. Upper Abdomen: Visualized upper abdomen shows no acute abnormality. Musculoskeletal: No chest wall abnormality. No acute or significant osseous findings. Review of the MIP images confirms the above findings. IMPRESSION: Findings consistent with lower lobe pulmonary emboli worse on the right than the left without evidence of right heart strain. 2.5 cm soft tissue mass lesion in the posterior costophrenic angle on the left new from a prior CT examination obtained in August of 2021. Neoplasm deserves consideration and further workup is recommended. Aortic Atherosclerosis (ICD10-I70.0). Critical Value/emergent results were called by telephone at the time of interpretation on 09/25/2020 at 6:22 pm to Dr. Kathrynn Humble, who verbally acknowledged these results. Electronically Signed   By: Inez Catalina M.D.   On: 09/25/2020 18:31   MR BRAIN WO CONTRAST  Result Date: 09/26/2020 CLINICAL DATA:  Initial evaluation for acute syncope. EXAM: MRI HEAD WITHOUT CONTRAST TECHNIQUE: Multiplanar, multiecho pulse sequences of the brain and surrounding structures were obtained without intravenous contrast. COMPARISON:  Prior MRI from 06/14/2019. FINDINGS: Brain: Generalized age-related cerebral volume loss. Patchy T2/FLAIR hyperintensity within the periventricular deep white matter both cerebral hemispheres most consistent with chronic small vessel ischemic disease, mild to moderate in nature. Patchy involvement of the pons noted. 8 mm focus of restricted diffusion seen involving the inferior right cerebellum, consistent with an  acute ischemic infarct (series 5, image 62). No associated hemorrhage or mass effect. No other diffusion abnormality to suggest acute or subacute ischemia. Gray-white matter differentiation otherwise maintained. No other areas of encephalomalacia to suggest chronic cortical infarction. No other evidence for acute or chronic intracranial hemorrhage. No mass lesion, midline shift or mass effect. No hydrocephalus or extra-axial fluid collection. Pituitary gland suprasellar region within normal limits. Midline structures intact. Vascular: Major intracranial vascular flow voids are maintained. Skull and upper cervical spine: Craniocervical junction within normal limits. Bone marrow signal intensity normal. No scalp soft tissue abnormality. Sinuses/Orbits: Globes and orbital soft tissues within normal limits. Paranasal sinuses are largely clear. No mastoid effusion. Inner ear structures grossly normal. Other: None. IMPRESSION: 1. 8 mm acute ischemic nonhemorrhagic infarct involving the inferior right cerebellum. 2. Mild-to-moderate chronic microvascular ischemic disease for age. Electronically Signed   By: Jeannine Boga M.D.   On: 09/26/2020 00:27   DG  Chest Port 1 View  Result Date: 09/25/2020 CLINICAL DATA:  near syncope EXAM: PORTABLE CHEST 1 VIEW COMPARISON:  01/16/2020 FINDINGS: Unchanged cardiomediastinal silhouette. There is no new focal airspace disease. There is no pleural effusion or visible pneumothorax. Skin fold overlying the left peripheral thorax. There is no acute osseous abnormality. IMPRESSION: No evidence of acute cardiopulmonary disease. Electronically Signed   By: Maurine Simmering   On: 09/25/2020 13:32   VAS US CAROTID  Result Date: 09/26/2020 Carotid Arterial Duplex Study Patient Name:  [name] [name] Milbrath  Date of Exam:   09/26/2020 Medical Rec #: [id number]          Accession #:    [id number] Date of Birth: [birth year]          Patient Gender: M Patient Age:   69Y Exam Location:  [facility] Procedure:      VAS US CAROTID Referring Phys: Deshler --------------------------------------------------------------------------------  Indications:       Syncope. Risk Factors:      Hypertension, Diabetes. Comparison Study:  no prior Performing Technologist: Abram Sander RVS  Examination Guidelines: A complete evaluation includes B-mode imaging, spectral Doppler, color Doppler, and power Doppler as needed of all accessible portions of each vessel. Bilateral testing is considered an integral part of a complete examination. Limited examinations for reoccurring indications may be performed as noted.  Right Carotid Findings: +----------+--------+--------+--------+------------------+--------+           PSV cm/sEDV cm/sStenosisPlaque DescriptionComments +----------+--------+--------+--------+------------------+--------+ CCA Prox  79                      heterogenous               +----------+--------+--------+--------+------------------+--------+ CCA Distal60      7               heterogenous               +----------+--------+--------+--------+------------------+--------+ ICA Prox  70      16      1-39%   heterogenous               +----------+--------+--------+--------+------------------+--------+ ICA Distal71      18                                         +----------+--------+--------+--------+------------------+--------+ ECA       91                                                 +----------+--------+--------+--------+------------------+--------+ +----------+--------+-------+--------+-------------------+           PSV cm/sEDV cmsDescribeArm Pressure (mmHG) +----------+--------+-------+--------+-------------------+ Subclavian221                                        +----------+--------+-------+--------+-------------------+ +---------+--------+--+--------+-+---------+ VertebralPSV cm/s26EDV cm/s7Antegrade  +---------+--------+--+--------+-+---------+  Left Carotid Findings: +----------+--------+--------+--------+------------------+--------+           PSV cm/sEDV cm/sStenosisPlaque DescriptionComments +----------+--------+--------+--------+------------------+--------+ CCA Prox  106     8               heterogenous               +----------+--------+--------+--------+------------------+--------+ CCA Distal74  9               heterogenous               +----------+--------+--------+--------+------------------+--------+ ICA Prox  66      14      1-39%   heterogenous               +----------+--------+--------+--------+------------------+--------+ ICA Distal85      21                                         +----------+--------+--------+--------+------------------+--------+ ECA       92                                                 +----------+--------+--------+--------+------------------+--------+ +----------+--------+--------+--------+-------------------+           PSV cm/sEDV cm/sDescribeArm Pressure (mmHG) +----------+--------+--------+--------+-------------------+ [id number]                                         +----------+--------+--------+--------+-------------------+ +---------+--------+--+--------+--+---------+ VertebralPSV cm/s58EDV cm/s11Antegrade +---------+--------+--+--------+--+---------+   Summary: Right Carotid: Velocities in the right ICA are consistent with a 1-39% stenosis. Left Carotid: Velocities in the left ICA are consistent with a 1-39% stenosis. Vertebrals: Bilateral vertebral arteries demonstrate antegrade flow. *See table(s) above for measurements and observations.     Preliminary     Scheduled Meds: . insulin aspart  0-15 Units Subcutaneous TID WC   Continuous Infusions: . sodium chloride 100 mL/hr at 09/26/20 1329  . heparin       LOS: 1 day   Time spent: 35 minutes.  Patrecia Pour, MD [facility] [email or web address] 09/26/2020, 2:52 PM

## 2020-09-26 NOTE — Progress Notes (Signed)
Carotid duplex has been completed.   Preliminary results in CV Proc.   Abram Sander 09/26/2020 1:56 PM

## 2020-09-26 NOTE — Progress Notes (Signed)
Patient in MRI at this time. Informed MD Vance Gather about patient being off the cardiac monitor at this time and  MD okay. Will connect the patient to the cardiac monitor as soon as he returns to the room.

## 2020-09-26 NOTE — Procedures (Signed)
Patient Name: [name] [name]  MRN: [id number]  Epilepsy Attending: Lora Havens  Referring Physician/Provider: Clint Bolder, NP Date: 09/26/2020  Duration: 28.34 mins  Patient history: 70 year old male with PMHx significant for HTN, type 2 DM, renal transplant in May 2021 on immunosuppression, and cryptococcal meningitis due to immunosuppression who presented for evaluation of near syncope. EEG to evaluate for seizure  Level of alertness: Awake, asleep  AEDs during EEG study: None  Technical aspects: This EEG study was done with scalp electrodes positioned according to the 10-20 International system of electrode placement. Electrical activity was acquired at a sampling rate of 500Hz  and reviewed with a high frequency filter of 70Hz  and a low frequency filter of 1Hz . EEG data were recorded continuously and digitally stored.   Description: The posterior dominant rhythm consists of 8-9 Hz activity of moderate voltage (25-35 uV) seen predominantly in posterior head regions, symmetric and reactive to eye opening and eye closing.  Sleep was characterized by vertex waves,  maximal frontocentral region.  EEG showed intermittent generalized p3 to 6 Hz theta-delta slowing. Hyperventilation and photic stimulation were not performed.     ABNORMALITY - Intermittent slow, generalized  IMPRESSION: This study is suggestive of mild diffuse encephalopathy, nonspecific etiology. No seizures or epileptiform discharges were seen throughout the recording.  Mirta Mally Barbra Sarks

## 2020-09-26 NOTE — Plan of Care (Signed)

## 2020-09-26 NOTE — Progress Notes (Addendum)
[place] for heparin>>Lovenox Indication: pulmonary embolus  Allergies  Allergen Reactions  . Other Rash    Oxyquinoline-white Pet-lanolin  . No Known Allergies   . Bag Balm  [Albolene] Rash    Patient Measurements: Weight: 69.4 kg (153 lb) Heparin Dosing Weight: 70 KG  Vital Signs: Temp: 98.7 F (37.1 C) (05/12 1154) Temp Source: Oral (05/12 1154) BP: 137/65 (05/12 1154) Pulse Rate: 77 (05/12 1154)  Labs: Recent Labs    09/25/20 1257 09/25/20 1520 09/26/20 0234 09/26/20 1243  HGB 11.8*  --  11.3*  --   HCT 35.9*  --  33.6*  --   PLT 207  --  164  --   HEPARINUNFRC  --   --  0.80* 0.98*  CREATININE 1.69*  --   --   --   TROPONINIHS 18* 23*  --   --     Estimated Creatinine Clearance: 38.8 mL/min (A) (by C-G formula based on SCr of 1.69 mg/dL (H)).   Assessment: 81 YOM with chief complaint of lightheadedness and weakness. 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 Patient indicates he was ambulating today with PT and became very weak and sweaty. CT PE positive for PE.  Hgb ~11 and PLT wnl.   Heparin level came back elevated again at 0.98.  Unfortunately, it was drawn on the same arm at the heparin infusion but patient can't be drawn on the other arm. We will adjust rate for now. Dr. Bonner Puna will consider Lovenox if no procedure are planned.   Addendum  Ok to transition to Lovenox per Dr. Bonner Puna. He has a hx of renal tx.   Scr 1.69>CrCl 39 ml/min Hgb 11.3, plt wnl  Goal of Therapy:  Anti-Xa 0.6-1 Monitor platelets by anticoagulation protocol: Yes   Plan:  Dc heparin Lovenox 70mg  SQ BID  Onnie Boer, PharmD, BCIDP, AAHIVP, CPP Infectious Disease Pharmacist 09/26/2020 2:30 PM

## 2020-09-26 NOTE — Consult Note (Signed)
Neurology Consultation  Reason for Consult: MRI brain with findings of inferior right cerebellar acute infarct Referring Physician: Dr. Bonner Puna  CC: Near syncope  History is obtained from: Chart review,   HPI: [name] [name] is a 70 y.o. 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 He was ambulating outside with physical therapy when he had a sudden onset of lightheadedness with weakness causing him to lower himself to his knees. He states that he was assisted inside to the couch when he had a syncopal episode and his wife told him that his whole body stiffened up. Initially, he was found to be hypotensive and diaphoretic with EMS with improvement following IVF. In the ED he was found to have an elevated D-dimer prompting a CT PE which revealed bilateral pulmonary embolisms with questionable soft tissue mass and he was started on heparin and admitted for further evaluation.    At baseline, [name] [name] lives with his wife and daughter. [name] [name] walks with a walker at home but his daughter helps him manage his medications. 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  ROS: A complete ROS was performed and is negative except as noted in the HPI  Past Medical History:  Diagnosis Date  . Anemia    as a younger man  . Arthritis    hands  . Cataract   . Chronic kidney disease    "starting to bother me now" (07/28/2012)  . Diabetes mellitus     type II  . Diarrhea, functional   . ED (erectile dysfunction)   . Hypertension   . Neuropathy   . Peripheral neuropathy    Past Surgical History:   Procedure Laterality Date  . APPENDECTOMY  1950's  . AV FISTULA PLACEMENT Right 05/14/2016   Procedure: ARTERIOVENOUS (AV) FISTULA CREATION- RIGHT ARM;  Surgeon: Serafina Mitchell, MD;  Location: [facility];  Service: Vascular;  Laterality: Right;  . COLONOSCOPY    . KIDNEY TRANSPLANT Right   . PROSTATE SURGERY    . UPPER GASTROINTESTINAL ENDOSCOPY     Family History  Problem Relation Age of Onset  . Colon polyps Mother   . Stroke Mother   . Diabetes Father   . Diabetes Cousin   . Prostate cancer Brother   . Other Daughter        kidney problems  . Esophageal cancer Maternal Aunt   . Colon cancer Neg Hx   . Stomach cancer Neg Hx   . Rectal cancer Neg Hx    Social History:   reports that he quit smoking about 34 years ago. His smoking use included cigarettes. He has a 1.20 pack-year smoking history. He has never used smokeless tobacco. He reports current alcohol use of about 1.0 standard drink of alcohol per week. He reports that he does not use drugs.  Medications  Current Facility-Administered Medications:  .  0.9 %  sodium chloride infusion, , Intravenous, Continuous, Para Skeans, MD, Last Rate: 100 mL/hr at 09/26/20 0407, New Bag at 09/26/20 0407 .  acetaminophen (TYLENOL) tablet 650 mg, 650 mg, Oral, Q6H PRN **OR** acetaminophen (TYLENOL) suppository 650 mg, 650 mg, Rectal, Q6H PRN, Para Skeans, MD .  heparin ADULT infusion 100 units/mL (25000 units/260mL), 1,000 Units/hr, Intravenous, Continuous, Franky Macho, RPH, Last Rate: 10 mL/hr at 09/26/20 0341, 1,000 Units/hr at 09/26/20 0341 .  insulin aspart (novoLOG) injection 0-15 Units, 0-15 Units, Subcutaneous, TID WC, Patel, Ekta V, MD .  ondansetron (ZOFRAN) tablet 4 mg, 4 mg, Oral, Q6H PRN **OR** ondansetron (ZOFRAN) injection 4 mg, 4 mg, Intravenous, Q6H PRN, Para Skeans, MD  Exam: Current vital signs: BP 125/78 (BP Location: Left Arm)   Pulse 66   Temp 98.1 F (36.7 C) (Oral)   Resp 13   SpO2 100%  Vital signs in  last 24 hours: Temp:  [98.1 F (36.7 C)-98.6 F (37 C)] 98.1 F (36.7 C) (05/12 0339) Pulse Rate:  [63-73] 66 (05/12 0339) Resp:  [11-21] 13 (05/12 0339) BP: (87-136)/(57-78) 125/78 (05/12 0339) SpO2:  [97 %-100 %] 100 % (05/12 0339) FiO2 (%):  [21 %] 21 % (05/11 2149)  GENERAL: Awake, alert, sitting up in bed, in no acute distress Psych: Affect appropriate for situation Head: Normocephalic and atraumatic EENT: Normal conjunctivae, no OP obstruction LUNGS: Normal respiratory effort. Non-labored breathing CV: Regular rate on tele ABDOMEN: Soft, non-tender, non-distended Ext: warm, 1+ nonpitting edema, no obvious deformity   NEURO:  Mental Status: Awake, alert, and oriented to self, location, and situation. He initially incorrectly states that he is 70 years old and quickly corrected to his correct age of 70. He also states that the year is 2021. Speech is intact without dysarthria or aphasia.  Naming, repetition, fluency, and comprehension intact. Patient is able to provide a clear and coherent history of present illness. No neglect noted. Cranial Nerves:  II: PERRL 4 mm/brisk. Visual fields full.  III, IV, VI: EOMI without ptosis V: Sensation is intact to light touch and symmetrical to face.  VII: Face is symmetric resting and smiling.  VIII: Hearing is intact to voice IX, X: Palate elevation is symmetric. Phonation normal.  XI: Normal sternocleidomastoid and trapezius muscle strength XII: Tongue protrudes midline without fasciculations Motor: 5/5 strength present in bilateral upper extremities. Bilateral lower extremities with weakness secondary to recent cryptococcal meningitis (recent discharge from rehab, receiving home PT). Right lower extremity 3/5 strength with some antigravity movement but unable to sustain antigravity movement. Left lower extremity 4/5 strength: able to overcome gravity but with minimal elevation off of bed on assessment.  Tone and bulk are normal.   Sensation: Intact to light touch bilaterally in all four extremities.  Coordination: FTN with subtle loss of fluid movement on left upper extremity. HKS unable to be assessed secondary to lower extremity weakness.  DTRs: Trace reflexes bilateral patellae, 2+ and symmetric biceps  Gait: Deferred  Labs I have reviewed labs in epic and the results pertinent to this consultation are: CBC    Component Value Date/Time   WBC 3.6 (L) 09/26/2020 0234   RBC 3.52 (L) 09/26/2020 0234   RBC 3.49 (L) 09/26/2020 0101   HGB 11.3 (L) 09/26/2020 0234   HCT 33.6 (L) 09/26/2020 0234   PLT 164 09/26/2020 0234   MCV 95.5 09/26/2020 0234   MCH 32.1 09/26/2020 0234   MCHC 33.6 09/26/2020 0234   RDW 13.4 09/26/2020 0234   LYMPHSABS 0.4 (L) 09/25/2020 1257   MONOABS 0.1 09/25/2020 1257   EOSABS 0.1 09/25/2020 1257   BASOSABS 0.0 09/25/2020 1257   CMP     Component Value Date/Time   NA 135 09/25/2020 1257   NA 139 09/16/2016 0000   K 4.3 09/25/2020  1257   CL 108 09/25/2020 1257   CO2 20 (L) 09/25/2020 1257   GLUCOSE 297 (H) 09/25/2020 1257   BUN 34 (H) 09/25/2020 1257   BUN 70 (A) 09/16/2016 0000   CREATININE 1.69 (H) 09/25/2020 1257   CALCIUM 8.6 (L) 09/25/2020 1257   PROT 6.3 (L) 07/07/2020 0416   ALBUMIN 3.4 (L) 07/07/2020 0416   AST 15 07/07/2020 0416   ALT 9 07/07/2020 0416   ALKPHOS 38 07/07/2020 0416   BILITOT 0.3 07/07/2020 0416   GFRNONAA 43 (L) 09/25/2020 1257   GFRAA 41 (L) 01/16/2020 1944   Lipid Panel     Component Value Date/Time   CHOL 252 (H) 05/22/2015 0820   TRIG 223 (H) 06/10/2019 0740   HDL 58.70 05/22/2015 0820   CHOLHDL 4 05/22/2015 0820   VLDL 39.2 05/22/2015 0820   LDLCALC 154 (H) 05/22/2015 0820   LDLDIRECT 144.5 06/18/2011 0903   Lab Results  Component Value Date   HGBA1C 6.9 (H) 09/25/2020   Imaging I have reviewed the images obtained: MRI examination of the brain: 1. 8 mm acute ischemic nonhemorrhagic infarct involving the inferior right  cerebellum. 2. Mild-to-moderate chronic microvascular ischemic disease for age.  Assessment: 70 year old male with PMHx significant for HTN, type 2 DM, renal transplant in May 2021 on immunosuppression, and cryptococcal meningitis due to immunosuppression who presented for evaluation of near syncope. Initial work up revealed an elevated D-dimer, bilateral PE, and MRI revealed an acute inferior right cerebellar infarct. - Examination reveals patient with some subtle ataxia of left upper extremity and chronic bilateral lower extremity weakness right weaker than left.  - MRI brain imaging complete due to presyncopal event revealing acute inferior right cerebellar infarct. Low suspicion that the infarct is the etiology for the presyncopal / syncopal event but expect that this is an incidental finding.  - DDx includes seizure versus convulsive syncope due to reports that Mr. Donahoe was witnessed to have full body stiffening per his wife. Will obtain routine EEG for further evaluation.   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 AC per primary team for PE. No antiplatelet while he is on Anticoagulation. Recommend resuming Aspirin if he is taken off Anticoagulation in the future. - Risk factor modification - Telemetry monitoring - PT consult, OT consult, Speech consult  [facility] Pager Number [phone]

## 2020-09-26 NOTE — Progress Notes (Signed)
[place] for heparin  Indication: pulmonary embolus  Allergies  Allergen Reactions  . Other Rash    Oxyquinoline-white Pet-lanolin  . No Known Allergies   . Bag Balm  [Albolene] Rash    Patient Measurements:   Heparin Dosing Weight: 70 KG  Vital Signs: BP: 124/63 (05/12 0230) Pulse Rate: 73 (05/12 0015)  Labs: Recent Labs    09/25/20 1257 09/25/20 1520 09/26/20 0234  HGB 11.8*  --  11.3*  HCT 35.9*  --  33.6*  PLT 207  --  164  HEPARINUNFRC  --   --  0.80*  CREATININE 1.69*  --   --   TROPONINIHS 18* 23*  --     CrCl cannot be calculated (Unknown ideal weight.).   Assessment: 89 YOM with chief complaint of lightheadedness and weakness. 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 Patient indicates he was ambulating today with PT and became very weak and sweaty. CT PE positive for PE.  Hgb ~11 and PLT wnl.   Heparin level supratherapeutic (0.8) on gtt at 1100 units/hr. No bleeding noted.  Goal of Therapy:  Heparin level 0.3-0.7 units/ml Monitor platelets by anticoagulation protocol: Yes   Plan:  Decrease heparin to 1000 units/hr  8-hour heparin level  Sherlon Handing, PharmD, BCPS Please see amion for complete clinical pharmacist phone list 09/26/2020 3:27 AM

## 2020-09-26 NOTE — Progress Notes (Signed)
Patient back to room. Patient back on cardiac monitor. NAD noted.

## 2020-09-26 NOTE — Progress Notes (Signed)
EEG Completed; Results Pending  

## 2020-09-26 NOTE — Consult Note (Signed)
NAME:  [name] [name], MRN:  [id number], DOB:  08-12-1950, LOS: 1 ADMISSION DATE:  09/25/2020, CONSULTATION DATE:   REFERRING MD: Dr. Bonner Puna, CHIEF COMPLAINT: Abnormal CT chest  History of Present Illness:  70 year old male with history of renal transplant, prostate cancer, diabetes mellitus, hypertension, prior COVID-19.  He had a prolonged hospitalization for cryptococcal infection with meningitis, associated septic shock, complicated by SIADH. He has been [place] from that hospitalization.  On the date of admission experienced weakness that was associated with a mechanical fall but no loss of consciousness.  Then later he experienced syncope.  CT-PA was done on admission that showed bilateral pulmonary emboli without evidence of right heart strain.  Also seen was a left lower lobe 2.5 cm rounded nodular opacity.  Pertinent  Medical History   Past Medical History:  Diagnosis Date  . Anemia    as a younger man  . Arthritis    hands  . Cataract   . Chronic kidney disease    "starting to bother me now" (07/28/2012)  . Diabetes mellitus     type II  . Diarrhea, functional   . ED (erectile dysfunction)   . Hypertension   . Neuropathy   . Peripheral neuropathy     Significant Hospital Events: Including procedures, antibiotic start and stop dates in addition to other pertinent events   . CT-PA 09/25/2020 shows a 2.5 cm rounded density in the left lower lobe, some small associated left hilar lymphadenopathy . CT chest [facility]) 07/15/2020 showed left lower lobe 2.6 x 3.1 cm rounded nodular lesion . Needle biopsy left lower lobe rounded lesion 07/17/2020 ([facility]) did not show any evidence of malignancy, did show abundant cryptococcus . MRI brain 09/25/2020 with an 8 mm acute ischemic nonhemorrhagic infarct in the inferior right cerebellum . Echocardiogram 09/25/2020 >>   Interim History / Subjective:  Feels well Denies dyspnea or chest pain Comfortable on room air  Objective   Blood  pressure (!) 141/67, pulse 77, temperature 98.5 F (36.9 C), temperature source Oral, resp. rate 18, weight 69.4 kg, SpO2 100 %.    FiO2 (%):  [21 %] 21 %   Intake/Output Summary (Last 24 hours) at 09/26/2020 1656 Last data filed at 09/26/2020 1508 Gross per 24 hour  Intake 5914.27 ml  Output --  Net 5914.27 ml   Filed Weights   09/26/20 1400  Weight: 69.4 kg    Examination: General: Thin gentleman, no distress laying in bed HENT: Oropharynx clear, somewhat dry.  Pupils equal Lungs: Distant, decreased at both bases, otherwise clear, no crackles or wheezes Cardiovascular: Regular, distant, no murmur Abdomen: Soft, nondistended with positive bowel sounds Extremities: No edema Neuro: Awake, alert, interacting appropriately, answers all questions and follows commands good strength. GU: Deferred    Resolved Hospital Problem list     Assessment & Plan:  Bilateral pulmonary emboli without evidence of right heart strain on CT, echocardiogram pending.  May have contributed to his syncope when superimposed on his other chronic issues. -Note plan for enoxaparin, should not be any contraindication given his right inferior cerebellar CVA was nonhemorrhagic.  Likely ultimately transition to [place]  Left lower lobe cryptococcoma, confirmed by [facility] at [facility] on 07/17/2020.  Sounds like it may be smaller in diameter currently compared with the March scan at [facility]. -Planning for long-range fluconazole.  He will need intermittent interval CT scans of the chest to confirm response, resolution.  No indication for repeat biopsy at this time.  Other active issues: Renal  transplant, immunosuppressed state.  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 Recent Labs  Lab 09/25/20 1257 09/26/20 0234  WBC 1.7* 3.6*    Liver Function Tests: No results for input(s): AST, ALT, ALKPHOS, BILITOT, PROT, ALBUMIN in the last 168 hours. No results for input(s): LIPASE, AMYLASE in the last 168 hours. No results for input(s): AMMONIA in the last 168 hours.  ABG    Component Value Date/Time   TCO2 35 07/28/2012 0345     Coagulation Profile: No results for input(s): INR, PROTIME in the last 168 hours.  Cardiac Enzymes: No results for input(s): CKTOTAL, CKMB, CKMBINDEX, TROPONINI in the last 168 hours.  HbA1C: Hgb A1c MFr Bld  Date/Time Value Ref Range Status  09/25/2020 10:30 PM 6.9 (H) 4.8 - 5.6 % Final    Comment:    (NOTE) Pre diabetes:          5.7%-6.4%  Diabetes:              >6.4%  Glycemic control for   <7.0% adults with diabetes   07/06/2020 04:59 AM 9.8 (H) 4.8 - 5.6 % Final    Comment:    (NOTE) Pre diabetes:          5.7%-6.4%  Diabetes:              >6.4%  Glycemic control for   <7.0% adults with diabetes     CBG: Recent Labs  Lab 09/25/20 1255 09/26/20 0912 09/26/20 1239  GLUCAP 270* 245* 207*    Review of Systems:   As per HPi  Past Medical History:  He,  has a past medical history of Anemia, Arthritis, Cataract, Chronic kidney disease, Diabetes mellitus, Diarrhea, functional, ED (erectile dysfunction), Hypertension, Neuropathy, and Peripheral neuropathy.   Surgical History:   Past Surgical History:  Procedure Laterality Date  . APPENDECTOMY  1950's  . AV FISTULA PLACEMENT Right 05/14/2016   Procedure: ARTERIOVENOUS (AV) FISTULA CREATION- RIGHT ARM;  Surgeon: Serafina Mitchell, MD;  Location: [facility];  Service: Vascular;   Laterality: Right;  . COLONOSCOPY    . KIDNEY TRANSPLANT Right   . PROSTATE SURGERY    . UPPER GASTROINTESTINAL ENDOSCOPY       Social History:   reports that he quit smoking about 34 years ago. His smoking use included cigarettes. He has a 1.20 pack-year smoking history. He has never used smokeless tobacco. He reports current alcohol use of about 1.0 standard drink of alcohol per week. He reports that he does not use drugs.   Family History:  His family history includes Colon polyps in his mother; Diabetes in his cousin and father; Esophageal cancer in his maternal aunt; Other in his daughter; Prostate cancer in his brother; Stroke in his mother. There is no history of Colon cancer, Stomach cancer, or Rectal cancer.   Allergies Allergies  Allergen Reactions  . Other  Rash    Oxyquinoline-white Pet-lanolin  . No Known Allergies   . Bag Balm  [Albolene] Rash     Home Medications  Prior to Admission medications   Medication Sig Start Date End Date Taking? Authorizing Provider  acetaminophen (TYLENOL) 325 MG tablet Take 2 tablets (650 mg total) by mouth every 6 (six) hours as needed for mild pain (or Fever >/= 101). 07/07/20  Yes [name], MD  acyclovir (ZOVIRAX) 200 MG capsule Take 400 mg by mouth 2 (two) times daily. 09/12/20  Yes [provider]  amLODipine (NORVASC) 5 MG tablet Take 1 tablet (5 mg total) by mouth daily. 07/07/20  Yes [name], MD  aspirin 81 MG tablet Take 1 tablet (81 mg total) by mouth daily. 08/09/14  Yes Marletta Lor, MD  atovaquone [facility]) 750 MG/5ML suspension Take 1,500 mg by mouth every morning. 09/12/20  Yes [provider]  carvedilol (COREG) 6.25 MG tablet Take 6.25 mg by mouth 2 (two) times daily. 09/11/20  Yes [provider]  Cholecalciferol (VITAMIN D3) 50 MCG (2000 UT) CAPS Take 2,000 Units by mouth daily.   Yes [provider]  [employer] SENNA PLUS 8.6-50 MG tablet Take 2 tablets by mouth daily. 09/12/20   Yes [provider]  famotidine (PEPCID) 20 MG tablet Take 20 mg by mouth 2 (two) times daily. 09/12/20  Yes [provider]  fluconazole (DIFLUCAN) 200 MG tablet Take 400 mg by mouth daily. 09/23/20  Yes [provider]  folic acid (FOLVITE) 1 MG tablet Take 1 mg by mouth daily. 09/12/20  Yes [provider]  insulin aspart (NOVOLOG) 100 UNIT/ML injection Inject 0-15 Units into the skin 4 (four) times daily - after meals and at bedtime. Patient taking differently: Inject 5 Units into the skin 4 (four) times daily - after meals and at bedtime. 07/07/20  Yes [name], MD  lidocaine (LIDODERM) 5 % Place 1 patch onto the skin See admin instructions. Remove & Discard patch within 12 hours or as directed by MD   Yes [provider]  Melatonin 3 MG TBDP Take 6 mg by mouth at bedtime. 09/13/20  Yes [provider]  oxybutynin (DITROPAN-XL) 5 MG 24 hr tablet Take 5 mg by mouth daily. 06/02/20  Yes [provider]  predniSONE (DELTASONE) 5 MG tablet Take 10 mg by mouth daily with breakfast.   Yes [provider]  tacrolimus (PROGRAF) 1 MG capsule Take 1 mg by mouth 2 (two) times daily. 10/31/19  Yes [provider]  thiamine 100 MG tablet Take 100 mg by mouth daily. 09/11/20  Yes [provider]  acetaminophen (TYLENOL) 650 MG suppository Place 1 suppository (650 mg total) rectally every 6 (six) hours as needed for mild pain (or Fever >/= 101). Patient not taking: No sig reported 07/07/20   [name], MD  albuterol (VENTOLIN HFA) 108 (90 Base) MCG/ACT inhaler Inhale 2 puffs into the lungs every 6 (six) hours as needed for wheezing or shortness of breath. Patient not taking: No sig reported 06/12/19   Amin, Jeanella Flattery, MD  cefTRIAXone 1 g in sodium chloride 0.9 % 100 mL Inject 1 g into the vein daily. Patient not taking: No sig reported 07/07/20   [name], MD  enoxaparin (LOVENOX) 30 MG/0.3ML injection Inject  0.3 mLs (30 mg total) into the skin daily. Patient not taking: No sig reported 07/07/20   [name], MD  insulin glargine (LANTUS) 100  UNIT/ML injection Inject 0.1 mLs (10 Units total) into the skin daily with lunch. Patient not taking: No sig reported 07/07/20   [name], MD  labetalol (NORMODYNE) 5 MG/ML injection Inject 4 mLs (20 mg total) into the vein every 3 (three) hours as needed (SBP more than 160 or DBP more than 100). Patient not taking: No sig reported 07/07/20   [name], MD  metoprolol tartrate (LOPRESSOR) 25 MG tablet Take 0.5 tablets (12.5 mg total) by mouth 2 (two) times daily. Patient not taking: No sig reported 07/07/20   [name], MD  ondansetron (ZOFRAN) 4 MG tablet Take 1 tablet (4 mg total) by mouth every 6 (six) hours as needed for nausea. Patient not taking: No sig reported 07/07/20   [name], MD  ondansetron [facility]) 4 MG/2ML SOLN injection Inject 2 mLs (4 mg total) into the vein every 6 (six) hours as needed for nausea. Patient not taking: No sig reported 07/07/20   [name], MD  pantoprazole (PROTONIX) 40 MG tablet Take 1 tablet (40 mg total) by mouth daily. 06/16/19 06/15/20  Kayleen Memos, DO  traZODone (DESYREL) 50 MG tablet Take 0.5 tablets (25 mg total) by mouth at bedtime as needed for sleep. Patient not taking: No sig reported 07/07/20   [name], MD  valGANciclovir (VALCYTE) 450 MG tablet Take 1 tablet (450 mg total) by mouth daily. Patient not taking: No sig reported 07/07/20   [name], MD     Critical care time: NA     Baltazar Apo, MD, PhD 09/26/2020, 4:57 PM [facility] [phone] or if no answer before 7:00PM call [phone] For any issues after 7:00PM please call eLink [phone]

## 2020-09-26 NOTE — Evaluation (Signed)
Physical Therapy Evaluation Patient Details Name: [name] [name] MRN: [id number] DOB: [birth year] Today's Date: 09/26/2020   History of Present Illness  Pt is 70 yo male who presented after syncopal episode on 09/25/20.  Pt found to have bil PE without evidence of heart strain started on Heparin. Pt also had MRI that revealed acute R inferior cerebellar stroke. He has history of recent prolonged admission and inpatient rehabilitation starting February 2022 for cryptococcal meningitis, renal transplant, HTN, DM, arthritis  Clinical Impression  Pt admitted with above diagnosis. Pt was able to transfer and ambulated 80' with min guard and RW for safety.  Slight deficits in R LE strength.  All VSS during therapy and pt tolerated well.  Pt has had declined function since February and was working with [facility].  Recommend continued HHPT. Pt has DME and home support.  Pt currently with functional limitations due to the deficits listed below (see PT Problem List). Pt will benefit from skilled PT to increase their independence and safety with mobility to allow discharge to the venue listed below.       Follow Up Recommendations Home health PT;Supervision - Intermittent (resume HHPT)    Equipment Recommendations  None recommended by PT    Recommendations for Other Services       Precautions / Restrictions Precautions Precautions: Fall      Mobility  Bed Mobility Overal bed mobility: Needs Assistance Bed Mobility: Supine to Sit     Supine to sit: Min guard;HOB elevated     General bed mobility comments: increased time    Transfers Overall transfer level: Needs assistance Equipment used: Rolling walker (2 wheeled) Transfers: Sit to/from Stand Sit to Stand: Min guard         General transfer comment: min guard for safety  Ambulation/Gait Ambulation/Gait assistance: Min guard Gait Distance (Feet): 80 Feet Assistive device: Rolling walker (2 wheeled) Gait Pattern/deviations:  Step-through pattern;Decreased stride length Gait velocity: decreased   General Gait Details: decreased speed ; fatigued easily, and narrow BOS but otherwise normal gait pattern  Stairs            Wheelchair Mobility    Modified Rankin (Stroke Patients Only) Modified Rankin (Stroke Patients Only) Pre-Morbid Rankin Score: Slight disability Modified Rankin: Moderate disability     Balance Overall balance assessment: Needs assistance   Sitting balance-Leahy Scale: Normal     Standing balance support: No upper extremity supported;Bilateral upper extremity supported Standing balance-Leahy Scale: Fair Standing balance comment: RW for ambulation but static stand no AD                             Pertinent Vitals/Pain Pain Assessment: No/denies pain    Home Living Family/patient expects to be discharged to:: Private residence Living Arrangements: Spouse/significant other;Children Available Help at Discharge: Family;Available 24 hours/day Type of Home: House Home Access: Stairs to enter Entrance Stairs-Rails: Psychiatric nurse of Steps: 2 Home Layout: One level Home Equipment: Clinical cytogeneticist - 2 wheels      Prior Function Level of Independence: Needs assistance   Gait / Transfers Assistance Needed: Pt was ambulating with RW ; he could ambulate short community distances  ADL's / Homemaking Assistance Needed: Reports basically independent but occasionally needed help with socks. Wife does IADLs.  Prior to February pt was completly independent.  Comments: Was getting HHPT s/p recent hospitalization     Hand Dominance        Extremity/Trunk Assessment  Upper Extremity Assessment Upper Extremity Assessment: Overall WFL for tasks assessed    Lower Extremity Assessment Lower Extremity Assessment: LLE deficits/detail;RLE deficits/detail (Pt reports R LE weakness) RLE Deficits / Details: ROM WFL; MMT ankle 5/5, knee ext 4+/5, hip  4/5 RLE Coordination: WNL (toe tap WNL; heel to shin -WNL but slightly decreased compared to L) LLE Deficits / Details: ROM WFL; MMT 5/5 LLE Sensation: WNL LLE Coordination: WNL (toe tap and heel to shin)    Cervical / Trunk Assessment Cervical / Trunk Assessment: Normal  Communication   Communication: No difficulties  Cognition Arousal/Alertness: Awake/alert Behavior During Therapy: WFL for tasks assessed/performed Overall Cognitive Status: Within Functional Limits for tasks assessed                                        General Comments General comments (skin integrity, edema, etc.): VSS - on RA with sats 100%; BP stable with position change; HR 70's-80's    Exercises     Assessment/Plan    PT Assessment Patient needs continued PT services  PT Problem List Decreased strength;Decreased mobility;Decreased activity tolerance;Decreased balance       PT Treatment Interventions DME instruction;Therapeutic activities;Therapeutic exercise;Gait training;Patient/family education;Stair training;Balance training;Functional mobility training    PT Goals (Current goals can be found in the Care Plan section)  Acute Rehab PT Goals Patient Stated Goal: return home PT Goal Formulation: With patient Time For Goal Achievement: 10/10/20 Potential to Achieve Goals: Good    Frequency Min 4X/week   Barriers to discharge        Co-evaluation               AM-PAC PT "6 Clicks" Mobility  Outcome Measure Help needed turning from your back to your side while in a flat bed without using bedrails?: A Little Help needed moving from lying on your back to sitting on the side of a flat bed without using bedrails?: A Little Help needed moving to and from a bed to a chair (including a wheelchair)?: A Little Help needed standing up from a chair using your arms (e.g., wheelchair or bedside chair)?: A Little Help needed to walk in hospital room?: A Little Help needed climbing  3-5 steps with a railing? : A Little 6 Click Score: 18    End of Session Equipment Utilized During Treatment: Gait belt Activity Tolerance: Patient tolerated treatment well Patient left: with chair alarm set;in chair;with call bell/phone within reach;with nursing/sitter in room Nurse Communication: Mobility status PT Visit Diagnosis: Other abnormalities of gait and mobility (R26.89);Muscle weakness (generalized) (M62.81)    Time: 5809-9833 PT Time Calculation (min) (ACUTE ONLY): 25 min   Charges:   PT Evaluation $PT Eval Low Complexity: 1 Low PT Treatments $Gait Training: 8-22 mins        Abran Richard, PT Acute Rehab Services Pager [phone] [facility] [place] 09/26/2020, 5:38 PM

## 2020-09-26 NOTE — Progress Notes (Signed)
Pt arrived to 2w10 via a stretcher. Report received from [facility]. Pt has IV x 1 and heparin gtt running at 60ml/hr. Pt A&O x 4. Call bell given to pt. Bed alarm on.

## 2020-09-27 ENCOUNTER — Inpatient Hospital Stay (HOSPITAL_COMMUNITY): Payer: Medicare Other

## 2020-09-27 DIAGNOSIS — R55 Syncope and collapse: Secondary | ICD-10-CM

## 2020-09-27 DIAGNOSIS — I6330 Cerebral infarction due to thrombosis of unspecified cerebral artery: Secondary | ICD-10-CM | POA: Insufficient documentation

## 2020-09-27 LAB — CBC
HCT: 31.6 % — ABNORMAL LOW (ref 39.0–52.0)
Hemoglobin: 10.8 g/dL — ABNORMAL LOW (ref 13.0–17.0)
MCH: 32 pg (ref 26.0–34.0)
MCHC: 34.2 g/dL (ref 30.0–36.0)
MCV: 93.5 fL (ref 80.0–100.0)
Platelets: 171 10*3/uL (ref 150–400)
RBC: 3.38 MIL/uL — ABNORMAL LOW (ref 4.22–5.81)
RDW: 13.6 % (ref 11.5–15.5)
WBC: 3.3 10*3/uL — ABNORMAL LOW (ref 4.0–10.5)
nRBC: 0 % (ref 0.0–0.2)

## 2020-09-27 LAB — BASIC METABOLIC PANEL
Anion gap: 6 (ref 5–15)
Anion gap: 9 (ref 5–15)
BUN: 28 mg/dL — ABNORMAL HIGH (ref 8–23)
BUN: 26 mg/dL — ABNORMAL HIGH (ref 8–23)
CO2: 19 mmol/L — ABNORMAL LOW (ref 22–32)
CO2: 17 mmol/L — ABNORMAL LOW (ref 22–32)
Calcium: 9 mg/dL (ref 8.9–10.3)
Calcium: 8.8 mg/dL — ABNORMAL LOW (ref 8.9–10.3)
Chloride: 110 mmol/L (ref 98–111)
Chloride: 108 mmol/L (ref 98–111)
Creatinine, Ser: 1.43 mg/dL — ABNORMAL HIGH (ref 0.61–1.24)
Creatinine, Ser: 1.33 mg/dL — ABNORMAL HIGH (ref 0.61–1.24)
GFR, Estimated: 52 mL/min — ABNORMAL LOW (ref >60)
GFR, Estimated: 57 mL/min — ABNORMAL LOW (ref >60)
Glucose, Bld: 268 mg/dL — ABNORMAL HIGH (ref 70–99)
Glucose, Bld: 206 mg/dL — ABNORMAL HIGH (ref 70–99)
Potassium: 5.9 mmol/L — ABNORMAL HIGH (ref 3.5–5.1)
Potassium: 5.7 mmol/L — ABNORMAL HIGH (ref 3.5–5.1)
Sodium: 135 mmol/L (ref 135–145)
Sodium: 134 mmol/L — ABNORMAL LOW (ref 135–145)

## 2020-09-27 LAB — LIPID PANEL
Cholesterol: 259 mg/dL — ABNORMAL HIGH (ref 0–200)
HDL: 56 mg/dL (ref >40)
LDL Cholesterol: UNDETERMINED mg/dL (ref 0–99)
Total CHOL/HDL Ratio: 4.6 RATIO
Triglycerides: 432 mg/dL — ABNORMAL HIGH (ref <150)
VLDL: UNDETERMINED mg/dL (ref 0–40)

## 2020-09-27 LAB — GLUCOSE, CAPILLARY
Glucose-Capillary: 201 mg/dL — ABNORMAL HIGH (ref 70–99)
Glucose-Capillary: 174 mg/dL — ABNORMAL HIGH (ref 70–99)
Glucose-Capillary: 236 mg/dL — ABNORMAL HIGH (ref 70–99)

## 2020-09-27 LAB — ECHOCARDIOGRAM COMPLETE BUBBLE STUDY
Area-P 1/2: 3.53 cm2
S' Lateral: 3.4 cm

## 2020-09-27 LAB — POTASSIUM: Potassium: 5.1 mmol/L (ref 3.5–5.1)

## 2020-09-27 LAB — LDL CHOLESTEROL, DIRECT: Direct LDL: 135.4 mg/dL — ABNORMAL HIGH (ref 0–99)

## 2020-09-27 MED ORDER — INSULIN ASPART 100 UNIT/ML IJ SOLN
3.0000 [IU] | Freq: Three times a day (TID) | INTRAMUSCULAR | Status: DC
  Administered 2020-09-27 (×2): 3 [IU] via SUBCUTANEOUS

## 2020-09-27 MED ORDER — AMLODIPINE BESYLATE 5 MG PO TABS
5.0000 mg | ORAL_TABLET | Freq: Every day | ORAL | Status: DC
  Administered 2020-09-27 – 2020-09-28 (×2): 5 mg via ORAL
  Filled 2020-09-27 (×2): qty 1

## 2020-09-27 MED ORDER — SODIUM ZIRCONIUM CYCLOSILICATE 10 G PO PACK
10.0000 g | PACK | Freq: Two times a day (BID) | ORAL | Status: AC
  Administered 2020-09-27 (×2): 10 g via ORAL
  Filled 2020-09-27 (×2): qty 1

## 2020-09-27 MED ORDER — CARVEDILOL 6.25 MG PO TABS
6.2500 mg | ORAL_TABLET | Freq: Two times a day (BID) | ORAL | Status: DC
  Administered 2020-09-27 – 2020-09-28 (×3): 6.25 mg via ORAL
  Filled 2020-09-27 (×3): qty 1

## 2020-09-27 MED ORDER — ROSUVASTATIN CALCIUM 20 MG PO TABS
20.0000 mg | ORAL_TABLET | Freq: Every day | ORAL | Status: DC
  Administered 2020-09-27 – 2020-09-28 (×2): 20 mg via ORAL
  Filled 2020-09-27 (×2): qty 1

## 2020-09-27 MED ORDER — ATORVASTATIN CALCIUM 80 MG PO TABS: 80.0000 mg | ORAL_TABLET | Freq: Every day | ORAL | Status: DC

## 2020-09-27 NOTE — Progress Notes (Signed)
  Echocardiogram 2D Echocardiogram has been performed.  [name] [name] 09/27/2020, 1:46 PM

## 2020-09-27 NOTE — Evaluation (Signed)
Occupational Therapy Evaluation Patient Details Name: [name] [name] MRN: [id number] DOB: 18-Aug-1950 Today's Date: 09/27/2020    History of Present Illness Pt is 70 yo male who presented after syncopal episode on 09/25/20.  Pt found to have bil PE without evidence of heart strain started on Heparin. Pt also had MRI that revealed acute R inferior cerebellar stroke. He has history of recent prolonged admission and inpatient rehabilitation starting February 2022 for cryptococcal meningitis, renal transplant, HTN, DM, arthritis   Clinical Impression   Pt was ambulating with a RW and had occasional assistance for LB dressing and all IADL prior to admission. Presents with mild generalized weakness, impaired standing balance, likely near his baseline. He needs up to min guard assist for ADL and mobility. Will follow acutely, but do not anticipate pt will need post acute OT.     Follow Up Recommendations  No OT follow up    Equipment Recommendations  None recommended by OT    Recommendations for Other Services       Precautions / Restrictions Precautions Precautions: Fall      Mobility Bed Mobility Overal bed mobility: Needs Assistance Bed Mobility: Supine to Sit     Supine to sit: Supervision     General bed mobility comments: increased time    Transfers Overall transfer level: Needs assistance Equipment used: Rolling walker (2 wheeled) Transfers: Sit to/from Stand Sit to Stand: Min guard         General transfer comment: min guard for safety    Balance     Sitting balance-Leahy Scale: Normal     Standing balance support: No upper extremity supported;Bilateral upper extremity supported Standing balance-Leahy Scale: Fair                             ADL either performed or assessed with clinical judgement   ADL Overall ADL's : Needs assistance/impaired Eating/Feeding: Independent;Sitting   Grooming: Wash/dry hands;Wash/dry face;Standing;Min guard    Upper Body Bathing: Set up;Sitting   Lower Body Bathing: Min guard;Sit to/from stand   Upper Body Dressing : Set up;Sitting   Lower Body Dressing: Min guard;Sit to/from stand   Toilet Transfer: Min guard;Ambulation;RW   Toileting- Water quality scientist and Hygiene: Min guard;Sit to/from stand               Vision Baseline Vision/History: Wears glasses Wears Glasses: Reading only Patient Visual Report: No change from baseline       Perception     Praxis      Pertinent Vitals/Pain Pain Assessment: No/denies pain     Hand Dominance Right   Extremity/Trunk Assessment Upper Extremity Assessment Upper Extremity Assessment: Overall WFL for tasks assessed   Lower Extremity Assessment Lower Extremity Assessment: Defer to PT evaluation   Cervical / Trunk Assessment Cervical / Trunk Assessment: Normal   Communication Communication Communication: No difficulties   Cognition Arousal/Alertness: Awake/alert Behavior During Therapy: WFL for tasks assessed/performed Overall Cognitive Status: Within Functional Limits for tasks assessed                                     General Comments       Exercises     Shoulder Instructions      Home Living Family/patient expects to be discharged to:: Private residence Living Arrangements: Spouse/significant other;Children Available Help at Discharge: Family;Available 24 hours/day Type of Home: House  Home Access: Stairs to enter Entrance Stairs-Number of Steps: 2 Entrance Stairs-Rails: Right;Left Home Layout: One level     Bathroom Shower/Tub: Teacher, early years/pre: Standard     Home Equipment: Clinical cytogeneticist - 2 wheels          Prior Functioning/Environment Level of Independence: Needs assistance  Gait / Transfers Assistance Needed: Pt was ambulating with RW ; he could ambulate short community distances ADL's / Homemaking Assistance Needed: Reports basically independent but  occasionally needed help with socks. Wife does IADLs.  Prior to February pt was completly independent.   Comments: Was getting HHPT s/p recent hospitalization        OT Problem List: Impaired balance (sitting and/or standing)      OT Treatment/Interventions: Self-care/ADL training;DME and/or AE instruction;Therapeutic activities;Patient/family education;Balance training    OT Goals(Current goals can be found in the care plan section) Acute Rehab OT Goals Patient Stated Goal: return home OT Goal Formulation: With patient Time For Goal Achievement: 10/11/20 Potential to Achieve Goals: Good ADL Goals Pt Will Perform Grooming: with modified independence;standing Pt Will Perform Lower Body Bathing: with modified independence;sit to/from stand Pt Will Perform Lower Body Dressing: with modified independence;sit to/from stand Pt Will Transfer to Toilet: with modified independence;ambulating Pt Will Perform Toileting - Clothing Manipulation and hygiene: with modified independence;sit to/from stand  OT Frequency: Min 2X/week   Barriers to D/C:            Co-evaluation              AM-PAC OT "6 Clicks" Daily Activity     Outcome Measure Help from another person eating meals?: None Help from another person taking care of personal grooming?: A Little Help from another person toileting, which includes using toliet, bedpan, or urinal?: A Little Help from another person bathing (including washing, rinsing, drying)?: A Little Help from another person to put on and taking off regular upper body clothing?: None Help from another person to put on and taking off regular lower body clothing?: A Little 6 Click Score: 20   End of Session    Activity Tolerance: Patient tolerated treatment well Patient left: in chair;with call bell/phone within reach;with chair alarm set;with nursing/sitter in room  OT Visit Diagnosis: Unsteadiness on feet (R26.81);Other abnormalities of gait and mobility  (R26.89)                Time: 7412-8786 OT Time Calculation (min): 29 min Charges:  OT General Charges $OT Visit: 1 Visit OT Evaluation $OT Eval Moderate Complexity: 1 Mod OT Treatments $Self Care/Home Management : 8-22 mins  Nestor Lewandowsky, OTR/L Acute Rehabilitation Services Pager: [phone] Office: [phone]  [name] [name] 09/27/2020, 9:19 AM

## 2020-09-27 NOTE — Progress Notes (Signed)
PROGRESS NOTE  [name] [name]  VOZ:[id number] DOB: [birth year] DOA: 09/25/2020 PCP: [name], [name], MD   Brief Narrative: [name] [name] is a 70 y.o. 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 He had felt weak while working at home with PT, had slow fall to his knees on the driveway, subsequently came inside to the couch where his wife describes an episode 4-5 minutes long of abrupt eyes rolling in back of head and rigidity with frothing at the mouth. He did not lose postural tone during this but was unresponsive. Afterwards he was poorly responsive, EMS called and he had another similar episode, associated with hypotension per EMS.   In the ED he was diagnosed with bibasilar R > L pulmonary emboli without hypoxia or pleuritic chest pain, a 2.4 cm left base pulmonary nodule, and subsequent MRI revealed acute right inferior cerebellar stroke felt to be causing mild left-sided ataxia. Further work up is underway.   Assessment & Plan: Principal Problem:   Syncope due to orthostatic hypotension Active Problems:   Diabetes mellitus type 2, controlled ([name])   Iron deficiency anemia, unspecified   Pulmonary embolism ([name])   Cerebral thrombosis with cerebral infarction  Syncope, hypotension, seizure-like activity: EEG with mild diffuse slowing, no epileptogenic discharges/seizure activity captured.  - Continue cardiac monitoring  Fall, deconditioning:  - Continue PT/OT to maintain strength  Acute bilateral PE: ?if provoked by relative immobility and/or undiagnosed malignancy (pulm. nodule).  - Convert lovenox to eliquis today (preferred over xarelto with concomitant fluconazole Tx).  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 Now 2.5cm. - Pulmonary consulted nonurgently for their input: Recommended surveillance CT intermittently and continued fluconazole as below.  History of cryptococcal meningitis:  - Continue fluconazole 400mg  daily, with plan for 1 year of this followed by 200mg  dosing beginning 08/03/2021.  Acute right inferior cerebellar ischemic infarct: With mild left sided ataxia, not cause for syncope.  - Pt previously on ASA 81mg  daily, now on anticoagulation. - Neurology consulted.  - Echo pending - MRA was limited (see results), will defer to neurology whether this bears repeating. - Lipid panel abnormal ?if not fasting sample. LDL 135, HDL 56, trig 432.  - Neurochecks - PT/OT/SLP  Hypertriglyceridemia:  - Recheck in AM fasting sample, consider treatment.   Hyperkalemia: History of this in 2018. Has been on ARB in the past.  - Confirmed by recheck. Give lokelma x2, recheck this PM and tomorrow AM. - Continue telemetry. No changes noted at this time.  Stage IIIa CKD (based on available lab values), history of ESRD now s/p renal transplant: SCr stable. - Continue tacrolimus 1 BID and prednisone 10mg  daily. Also continuing atovaquone PJP ppx, acyclovir HSV ppx.  - Tacrolimus level recently decreased after level was 14.6 (typical goal for renal transplant patients at trough is 3 to 7 ng/mL), will recheck level next week (either as inpatient or outpatient). - Avoid nephrotoxins  IDT2DM: HbA1c 6.9% indicates chronic CBG at goal.  - SSI AC, carb-mod diet. CBGs up, will add low dose mealtime novolog.  Also notes history of pseudomonas UTI, C. difficile PCR positive, toxin negative, and intermittent urinary retention.  - History of bladder spasms: Continue home oxybutynin. Monitor closely  for retention.  - Monitor for infection. 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  DVT prophylaxis: Lovenox Code Status: Full Family Communication: Wife at bedside on PM rounds yesterday, will call daughter today. Disposition Plan:  Status is: Inpatient  Remains inpatient appropriate because:Ongoing diagnostic testing needed not appropriate for outpatient work up  Dispo: The patient is from: Home              Anticipated d/c is to: Home likely 5/14 if potassium improves and work up is completed.              Patient currently is not medically stable to d/c.   Difficult to place patient No  Consultants:   Neurology  Pulmonology  Procedures:   None  Antimicrobials:  None   Subjective: Pt alert, interactive, conversant, oriented, up to chair eating breakfast independently and avidly. No new weakness or numbness or trouble speaking. No further episodes of LOC at all. Feels well.   Objective: Vitals:   09/26/20 1713 09/26/20 2132 09/27/20 0511 09/27/20 0830  BP:  128/64 (!) 156/71 (!) 144/70  Pulse:  77 62 80  Resp:  17 12 20   Temp:  98.2 F (36.8 C) 98.3 F (36.8 C) 97.7 F (36.5 C)  TempSrc:  Oral Oral Oral  SpO2: 100% 100% 100% 100%  Weight:        Intake/Output Summary (Last 24 hours) at 09/27/2020 1116 Last data filed at 09/27/2020 0900 Gross per 24 hour  Intake 1255.35 ml  Output 1300 ml  Net -44.65 ml   Filed Weights   09/26/20 1400  Weight: 69.4 kg   Gen: 70 y.o. male in no distress Pulm: Nonlabored breathing room air. Clear. CV: Regular rate and rhythm. No murmur, rub, or gallop. No JVD, 1+ pitting dependent edema which pt reports is near baseline. GI: Abdomen soft, non-tender, non-distended, with normoactive bowel sounds.  Ext: Warm, no deformities Skin: No rashes, lesions or ulcers on visualized skin. Neuro: Alert and oriented. No focal neurological deficits. Psych: Judgement and insight appear fair. Mood euthymic &  affect congruent. Behavior is appropriate.    Data Reviewed: I have personally reviewed following labs and imaging studies  CBC: Recent Labs  Lab 09/25/20 1257 09/26/20 0234 09/27/20 0112  WBC 1.7* 3.6* 3.3*  NEUTROABS 1.1*  --   --   HGB 11.8* 11.3* 10.8*  HCT 35.9* 33.6* 31.6*  MCV 96.2 95.5 93.5  PLT 207 164 106   Basic Metabolic Panel: Recent Labs  Lab 09/25/20 1257 09/27/20 0112 09/27/20 0812  NA 135 135 134*  K 4.3 5.9* 5.7*  CL 108 110 108  CO2 20* 19* 17*  GLUCOSE 297* 268* 206*  BUN 34* 28* 26*  CREATININE 1.69* 1.43* 1.33*  CALCIUM 8.6* 9.0 8.8*   GFR: Estimated Creatinine Clearance: 49.3 mL/min (A) (by C-G formula based on SCr of 1.33 mg/dL (H)). Liver Function Tests: No results for input(s): AST, ALT, ALKPHOS, BILITOT, PROT, ALBUMIN in the last 168 hours. No results for input(s): LIPASE, AMYLASE in the last 168 hours. No results for input(s): AMMONIA in the last 168 hours. Coagulation Profile: No results for input(s): INR, PROTIME in the last 168 hours. Cardiac Enzymes: No results for input(s): CKTOTAL, CKMB, CKMBINDEX, TROPONINI in the last 168 hours. BNP (last 3 results)  No results for input(s): PROBNP in the last 8760 hours. HbA1C: Recent Labs    09/25/20 2230  HGBA1C 6.9*   CBG: Recent Labs  Lab 09/25/20 1255 09/26/20 0912 09/26/20 1239 09/26/20 1721 09/27/20 0814  GLUCAP 270* 245* 207* 123* 201*   Lipid Profile: Recent Labs    09/27/20 0112  CHOL 259*  HDL 56  LDLCALC UNABLE TO CALCULATE IF TRIGLYCERIDE OVER 400 mg/dL  TRIG 432*  CHOLHDL 4.6  LDLDIRECT 135.4*   Thyroid Function Tests: Recent Labs    09/25/20 2230 09/26/20 0101  TSH 2.061  --   FREET4  --  0.75   Anemia Panel: Recent Labs    09/26/20 0101  VITAMINB12 1,206*  FOLATE 37.5  FERRITIN 197  TIBC 211*  IRON 43*  RETICCTPCT 1.7   Urine analysis:    Component Value Date/Time   COLORURINE YELLOW 07/05/2020 2233   APPEARANCEUR HAZY (A) 07/05/2020 2233    LABSPEC 1.014 07/05/2020 2233   PHURINE 5.0 07/05/2020 2233   GLUCOSEU NEGATIVE 07/05/2020 2233   HGBUR SMALL (A) 07/05/2020 2233   HGBUR trace-lysed 06/16/2010 0833   BILIRUBINUR NEGATIVE 07/05/2020 2233   BILIRUBINUR n 05/22/2015 0927   KETONESUR 5 (A) 07/05/2020 2233   PROTEINUR 30 (A) 07/05/2020 2233   UROBILINOGEN 0.2 05/22/2015 0927   UROBILINOGEN 0.2 07/31/2014 2130   NITRITE NEGATIVE 07/05/2020 2233   LEUKOCYTESUR LARGE (A) 07/05/2020 2233   Recent Results (from the past 240 hour(s))  Resp Panel by RT-PCR (Flu A&B, Covid) Nasopharyngeal Swab     Status: None   Collection Time: 09/25/20 11:43 PM   Specimen: Nasopharyngeal Swab; Nasopharyngeal(NP) swabs in vial transport medium  Result Value Ref Range Status   SARS Coronavirus 2 by RT PCR NEGATIVE NEGATIVE Final    Comment: (NOTE) SARS-CoV-2 target nucleic acids are NOT DETECTED.  The SARS-CoV-2 RNA is generally detectable in upper respiratory specimens during the acute phase of infection. The lowest concentration of SARS-CoV-2 viral copies this assay can detect is 138 copies/mL. A negative result does not preclude SARS-Cov-2 infection and should not be used as the sole basis for treatment or other patient management decisions. A negative result may occur with  improper specimen collection/handling, submission of specimen other than nasopharyngeal swab, presence of viral mutation(s) within the areas targeted by this assay, and inadequate number of viral copies(<138 copies/mL). A negative result must be combined with clinical observations, patient history, and epidemiological information. The expected result is Negative.  Fact Sheet for Patients:  EntrepreneurPulse.com.au  Fact Sheet for Healthcare Providers:  IncredibleEmployment.be  This test is no t yet approved or cleared by the [place] FDA and  has been authorized for detection and/or diagnosis of SARS-CoV-2 by FDA  under an Emergency Use Authorization (EUA). This EUA will remain  in effect (meaning this test can be used) for the duration of the COVID-19 declaration under Section 564(b)(1) of the Act, 21 U.S.C.section 360bbb-3(b)(1), unless the authorization is terminated  or revoked sooner.       Influenza A by PCR NEGATIVE NEGATIVE Final   Influenza B by PCR NEGATIVE NEGATIVE Final    Comment: (NOTE) The Xpert Xpress SARS-CoV-2/FLU/RSV plus assay is intended as an aid in the diagnosis of influenza from Nasopharyngeal swab specimens and should not be used as a sole basis for treatment. Nasal washings and aspirates are unacceptable for Xpert Xpress SARS-CoV-2/FLU/RSV testing.  Fact Sheet for Patients: EntrepreneurPulse.com.au  Fact Sheet for Healthcare Providers: IncredibleEmployment.be  This test is not yet approved  or cleared by the [place] and has been authorized for detection and/or diagnosis of SARS-CoV-2 by FDA under an Emergency Use Authorization (EUA). This EUA will remain in effect (meaning this test can be used) for the duration of the COVID-19 declaration under Section 564(b)(1) of the Act, 21 U.S.C. section 360bbb-3(b)(1), unless the authorization is terminated or revoked.  Performed at [facility], [place] [address] [place],  [address]       Radiology Studies: CT Angio Chest PE W and/or Wo Contrast  Result Date: 09/25/2020 CLINICAL DATA:  Near syncopal episode EXAM: CT ANGIOGRAPHY CHEST WITH CONTRAST TECHNIQUE: Multidetector CT imaging of the chest was performed using the standard protocol during bolus administration of intravenous contrast. Multiplanar CT image reconstructions and MIPs were obtained to evaluate the vascular anatomy. CONTRAST:  1mL OMNIPAQUE IOHEXOL 350 MG/ML SOLN COMPARISON:  Chest x-ray from earlier in the same day, CT from 01/16/2020. FINDINGS: Cardiovascular: Thoracic aorta demonstrates  atherosclerotic calcifications without aneurysmal dilatation or dissection. Heart is at the upper limits of normal in size. Pulmonary artery demonstrates a normal branching pattern bilaterally. There are filling defects identified primarily within the lower lobe branches on the right and 2 Eloesser degree in the lower lobe branches on the left. No definitive right heart strain is noted. No coronary calcifications are seen. Mediastinum/Nodes: Thoracic inlet is within normal limits. The esophagus as visualized is within normal limits. Small left hilar and prevascular nodes are seen measuring less than 1 cm in diameter. Lungs/Pleura: Lungs are well aerated bilaterally. There is a 2.5 cm rounded density identified in the left lower lobe laterally. Small associated hilar lymph nodes are seen. The lungs are otherwise well aerated without focal infiltrate or sizable effusion. Upper Abdomen: Visualized upper abdomen shows no acute abnormality. Musculoskeletal: No chest wall abnormality. No acute or significant osseous findings. Review of the MIP images confirms the above findings. IMPRESSION: Findings consistent with lower lobe pulmonary emboli worse on the right than the left without evidence of right heart strain. 2.5 cm soft tissue mass lesion in the posterior costophrenic angle on the left new from a prior CT examination obtained in August of 2021. Neoplasm deserves consideration and further workup is recommended. Aortic Atherosclerosis (ICD10-I70.0). Critical Value/emergent results were called by telephone at the time of interpretation on 09/25/2020 at 6:22 pm to Dr. Kathrynn Humble, who verbally acknowledged these results. Electronically Signed   By: Inez Catalina M.D.   On: 09/25/2020 18:31   MR ANGIO HEAD WO CONTRAST  Result Date: 09/26/2020 CLINICAL DATA:  Acute stroke inferior right cerebellum seen by MRI yesterday EXAM: MRA HEAD WITHOUT CONTRAST TECHNIQUE: Angiographic images of the Circle of Willis were acquired  using MRA technique without intravenous contrast. COMPARISON:  MRI 09/25/2020 FINDINGS: Anterior circulation: Both internal carotid arteries are widely patent through the skull base and siphon regions. The anterior and middle cerebral vessels appear normal without proximal stenosis, aneurysm or vascular malformation. No vessel occlusion is identified. Posterior circulation: Both vertebral arteries are patent at the level of the mid cerebellum. The examination did not begin at the foramen magnum. There appears to be flow in posteroinferior cerebellar artery branches on both sides with the PICA origins are not included. This examination could be repeated if desired. Both anterior inferior cerebellar arteries show flow. Both superior cerebellar and posterior cerebral arteries appear normal. Patent posterior communicating arteries are present. Anatomic variants: None significant. Other: None IMPRESSION: No anterior circulation abnormality seen. Examination begins at the mid cerebellar level.  We can see flow in PICA branches on both sides, but the PICA origins are not included. This examination could be repeated if desired. Distal vertebral arteries appear normal. Basilar artery is normal. Anterior inferior cerebellar arteries, superior cerebellar arteries and posterior cerebral arteries are normal. Electronically Signed   By: Nelson Chimes M.D.   On: 09/26/2020 18:33   MR BRAIN WO CONTRAST  Result Date: 09/26/2020 CLINICAL DATA:  Initial evaluation for acute syncope. EXAM: MRI HEAD WITHOUT CONTRAST TECHNIQUE: Multiplanar, multiecho pulse sequences of the brain and surrounding structures were obtained without intravenous contrast. COMPARISON:  Prior MRI from 06/14/2019. FINDINGS: Brain: Generalized age-related cerebral volume loss. Patchy T2/FLAIR hyperintensity within the periventricular deep white matter both cerebral hemispheres most consistent with chronic small vessel ischemic disease, mild to moderate in nature.  Patchy involvement of the pons noted. 8 mm focus of restricted diffusion seen involving the inferior right cerebellum, consistent with an acute ischemic infarct (series 5, image 62). No associated hemorrhage or mass effect. No other diffusion abnormality to suggest acute or subacute ischemia. Gray-white matter differentiation otherwise maintained. No other areas of encephalomalacia to suggest chronic cortical infarction. No other evidence for acute or chronic intracranial hemorrhage. No mass lesion, midline shift or mass effect. No hydrocephalus or extra-axial fluid collection. Pituitary gland suprasellar region within normal limits. Midline structures intact. Vascular: Major intracranial vascular flow voids are maintained. Skull and upper cervical spine: Craniocervical junction within normal limits. Bone marrow signal intensity normal. No scalp soft tissue abnormality. Sinuses/Orbits: Globes and orbital soft tissues within normal limits. Paranasal sinuses are largely clear. No mastoid effusion. Inner ear structures grossly normal. Other: None. IMPRESSION: 1. 8 mm acute ischemic nonhemorrhagic infarct involving the inferior right cerebellum. 2. Mild-to-moderate chronic microvascular ischemic disease for age. Electronically Signed   By: Jeannine Boga M.D.   On: 09/26/2020 00:27   DG Chest Port 1 View  Result Date: 09/25/2020 CLINICAL DATA:  near syncope EXAM: PORTABLE CHEST 1 VIEW COMPARISON:  01/16/2020 FINDINGS: Unchanged cardiomediastinal silhouette. There is no new focal airspace disease. There is no pleural effusion or visible pneumothorax. Skin fold overlying the left peripheral thorax. There is no acute osseous abnormality. IMPRESSION: No evidence of acute cardiopulmonary disease. Electronically Signed   By: Maurine Simmering   On: 09/25/2020 13:32   EEG adult  Result Date: 09/26/2020 Lora Havens, MD     09/26/2020  5:09 PM Patient Name: TRAVEION RUDDOCK MRN: [id number] Epilepsy Attending:  Lora Havens Referring Physician/Provider: Clint Bolder, NP Date: 09/26/2020 Duration: 28.34 mins Patient history: 70 year old male with PMHx significant for HTN, type 2 DM, renal transplant in May 2021 on immunosuppression, and cryptococcal meningitis due to immunosuppression who presented for evaluation of near syncope. EEG to evaluate for seizure Level of alertness: Awake, asleep AEDs during EEG study: None Technical aspects: This EEG study was done with scalp electrodes positioned according to the 10-20 International system of electrode placement. Electrical activity was acquired at a sampling rate of 500Hz  and reviewed with a high frequency filter of 70Hz  and a low frequency filter of 1Hz . EEG data were recorded continuously and digitally stored. Description: The posterior dominant rhythm consists of 8-9 Hz activity of moderate voltage (25-35 uV) seen predominantly in posterior head regions, symmetric and reactive to eye opening and eye closing.  Sleep was characterized by vertex waves,  maximal frontocentral region.  EEG showed intermittent generalized p3 to 6 Hz theta-delta slowing. Hyperventilation and photic stimulation were not performed.   ABNORMALITY -  Intermittent slow, generalized IMPRESSION: This study is suggestive of mild diffuse encephalopathy, nonspecific etiology. No seizures or epileptiform discharges were seen throughout the recording. Priyanka Barbra Sarks   VAS US CAROTID  Result Date: 09/26/2020 Carotid Arterial Duplex Study Patient Name:  EIDEN BAGOT Jankovich  Date of Exam:   09/26/2020 Medical Rec #: [id number]          Accession #:    [id number] Date of Birth: 1950-09-19          Patient Gender: M Patient Age:   69Y Exam Location:  [facility] Procedure:      VAS US CAROTID Referring Phys: [place] --------------------------------------------------------------------------------  Indications:       Syncope. Risk Factors:      Hypertension, Diabetes. Comparison Study:  no  prior Performing Technologist: Abram Sander RVS  Examination Guidelines: A complete evaluation includes B-mode imaging, spectral Doppler, color Doppler, and power Doppler as needed of all accessible portions of each vessel. Bilateral testing is considered an integral part of a complete examination. Limited examinations for reoccurring indications may be performed as noted.  Right Carotid Findings: +----------+--------+--------+--------+------------------+--------+           PSV cm/sEDV cm/sStenosisPlaque DescriptionComments +----------+--------+--------+--------+------------------+--------+ CCA Prox  79                      heterogenous               +----------+--------+--------+--------+------------------+--------+ CCA Distal60      7               heterogenous               +----------+--------+--------+--------+------------------+--------+ ICA Prox  70      16      1-39%   heterogenous               +----------+--------+--------+--------+------------------+--------+ ICA Distal71      18                                         +----------+--------+--------+--------+------------------+--------+ ECA       91                                                 +----------+--------+--------+--------+------------------+--------+ +----------+--------+-------+--------+-------------------+           PSV cm/sEDV cmsDescribeArm Pressure (mmHG) +----------+--------+-------+--------+-------------------+ Subclavian221                                        +----------+--------+-------+--------+-------------------+ +---------+--------+--+--------+-+---------+ VertebralPSV cm/s26EDV cm/s7Antegrade +---------+--------+--+--------+-+---------+  Left Carotid Findings: +----------+--------+--------+--------+------------------+--------+           PSV cm/sEDV cm/sStenosisPlaque DescriptionComments +----------+--------+--------+--------+------------------+--------+ CCA  Prox  106     8               heterogenous               +----------+--------+--------+--------+------------------+--------+ CCA Distal74      9               heterogenous               +----------+--------+--------+--------+------------------+--------+ ICA Prox  66      14  1-39%   heterogenous               +----------+--------+--------+--------+------------------+--------+ ICA Distal85      21                                         +----------+--------+--------+--------+------------------+--------+ ECA       92                                                 +----------+--------+--------+--------+------------------+--------+ +----------+--------+--------+--------+-------------------+           PSV cm/sEDV cm/sDescribeArm Pressure (mmHG) +----------+--------+--------+--------+-------------------+ [id number]                                         +----------+--------+--------+--------+-------------------+ +---------+--------+--+--------+--+---------+ VertebralPSV cm/s58EDV cm/s11Antegrade +---------+--------+--+--------+--+---------+   Summary: Right Carotid: Velocities in the right ICA are consistent with a 1-39% stenosis. Left Carotid: Velocities in the left ICA are consistent with a 1-39% stenosis. Vertebrals: Bilateral vertebral arteries demonstrate antegrade flow. *See table(s) above for measurements and observations.  Electronically signed by Jamelle Haring on 09/26/2020 at 4:28:40 PM.    Final     Scheduled Meds: . acyclovir  400 mg Oral BID  . atovaquone  1,500 mg Oral q morning  . cholecalciferol  2,000 Units Oral Daily  . enoxaparin (LOVENOX) injection  70 mg Subcutaneous Q12H  . famotidine  20 mg Oral Daily  . fluconazole  400 mg Oral Daily  . folic acid  1 mg Oral Daily  . insulin aspart  0-15 Units Subcutaneous TID WC  . melatonin  6 mg Oral QHS  . oxybutynin  5 mg Oral Daily  . predniSONE  10 mg Oral Q breakfast  .  senna-docusate  2 tablet Oral Daily  . sodium zirconium cyclosilicate  10 g Oral BID  . tacrolimus  1 mg Oral BID  . thiamine  100 mg Oral Daily   Continuous Infusions:    LOS: 2 days   Time spent: 35 minutes.  Patrecia Pour, MD [facility] [email or web address] 09/27/2020, 11:16 AM

## 2020-09-27 NOTE — Progress Notes (Signed)
Atorvastatin 80mg  was ordered for his s/p CVA and HLD. He is on chronic fluconazole. D/w Claiborne Billings, PA, we will change to rosuvastatin instead. It can still affect tacrolimus level. Recommend repeating level to assess therapeutic appropriateness.   Onnie Boer, PharmD, BCIDP, AAHIVP, CPP Infectious Disease Pharmacist 09/27/2020 12:56 PM

## 2020-09-27 NOTE — Progress Notes (Addendum)
STROKE TEAM PROGRESS NOTE   INTERVAL HISTORY No family memebers at the bedside.   Patient is unable to give a clear history.  He presented with a near syncopal event and was found to have bilateral pulmonary embolism and MRI shows a small likely incidental cerebellar infarct.  Vitals:   09/26/20 1713 09/26/20 2132 09/27/20 0511 09/27/20 0830  BP:  128/64 (!) 156/71 (!) 144/70  Pulse:  77 62 80  Resp:  17 12 20   Temp:  98.2 F (36.8 C) 98.3 F (36.8 C) 97.7 F (36.5 C)  TempSrc:  Oral Oral Oral  SpO2: 100% 100% 100% 100%  Weight:       CBC:  Recent Labs  Lab 09/25/20 1257 09/26/20 0234 09/27/20 0112  WBC 1.7* 3.6* 3.3*  NEUTROABS 1.1*  --   --   HGB 11.8* 11.3* 10.8*  HCT 35.9* 33.6* 31.6*  MCV 96.2 95.5 93.5  PLT 207 164 161   Basic Metabolic Panel:  Recent Labs  Lab 09/27/20 0112 09/27/20 0812  NA 135 134*  K 5.9* 5.7*  CL 110 108  CO2 19* 17*  GLUCOSE 268* 206*  BUN 28* 26*  CREATININE 1.43* 1.33*  CALCIUM 9.0 8.8*    Lipid Panel:  Recent Labs  Lab 09/27/20 0112  CHOL 259*  TRIG 432*  HDL 56  CHOLHDL 4.6  VLDL UNABLE TO CALCULATE IF TRIGLYCERIDE OVER 400 mg/dL  LDLCALC UNABLE TO CALCULATE IF TRIGLYCERIDE OVER 400 mg/dL    HgbA1c:  Recent Labs  Lab 09/25/20 2230  HGBA1C 6.9*   Urine Drug Screen: No results for input(s): LABOPIA, COCAINSCRNUR, LABBENZ, AMPHETMU, THCU, LABBARB in the last 168 hours.  Alcohol Level No results for input(s): ETH in the last 168 hours.  IMAGING past 24 hours MR ANGIO HEAD WO CONTRAST  Result Date: 09/26/2020 CLINICAL DATA:  Acute stroke inferior right cerebellum seen by MRI yesterday EXAM: MRA HEAD WITHOUT CONTRAST TECHNIQUE: Angiographic images of the Circle of Willis were acquired using MRA technique without intravenous contrast. COMPARISON:  MRI 09/25/2020 FINDINGS: Anterior circulation: Both internal carotid arteries are widely patent through the skull base and siphon regions. The anterior and middle cerebral vessels  appear normal without proximal stenosis, aneurysm or vascular malformation. No vessel occlusion is identified. Posterior circulation: Both vertebral arteries are patent at the level of the mid cerebellum. The examination did not begin at the foramen magnum. There appears to be flow in posteroinferior cerebellar artery branches on both sides with the PICA origins are not included. This examination could be repeated if desired. Both anterior inferior cerebellar arteries show flow. Both superior cerebellar and posterior cerebral arteries appear normal. Patent posterior communicating arteries are present. Anatomic variants: None significant. Other: None IMPRESSION: No anterior circulation abnormality seen. Examination begins at the mid cerebellar level. We can see flow in PICA branches on both sides, but the PICA origins are not included. This examination could be repeated if desired. Distal vertebral arteries appear normal. Basilar artery is normal. Anterior inferior cerebellar arteries, superior cerebellar arteries and posterior cerebral arteries are normal. Electronically Signed   By: Nelson Chimes M.D.   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 EEG to evaluate for seizure Level of alertness: Awake, asleep AEDs during EEG study: None Technical aspects: This EEG study was done with scalp electrodes positioned according to the 10-20 International system of electrode placement. Electrical activity was acquired at a sampling rate of 500Hz  and reviewed with a high  frequency filter of 70Hz  and a low frequency filter of 1Hz . EEG data were recorded continuously and digitally stored. Description: The posterior dominant rhythm consists of 8-9 Hz activity of moderate voltage (25-35 uV) seen predominantly in posterior head regions, symmetric and reactive to eye opening and eye closing.  Sleep was characterized by vertex waves,  maximal frontocentral region.  EEG showed intermittent generalized p3 to 6 Hz theta-delta slowing. Hyperventilation and photic stimulation were not performed.   ABNORMALITY - Intermittent slow, generalized IMPRESSION: This study is suggestive of mild diffuse encephalopathy, nonspecific etiology. No seizures or epileptiform discharges were seen throughout the recording. Priyanka Barbra Sarks   VAS US CAROTID  Result Date: 09/26/2020 Carotid Arterial Duplex Study Patient Name:  [name] [name] Cabler  Date of Exam:   09/26/2020 Medical Rec #: [id number]          Accession #:    [id number] Date of Birth: [birth year]          Patient Gender: M Patient Age:   69Y Exam Location:  [facility] Procedure:      VAS US CAROTID Referring Phys: [place] --------------------------------------------------------------------------------  Indications:       Syncope. Risk Factors:      Hypertension, Diabetes. Comparison Study:  no prior Performing Technologist: Abram Sander RVS  Examination Guidelines: A complete evaluation includes B-mode imaging, spectral Doppler, color Doppler, and power Doppler as needed of all accessible portions of each vessel. Bilateral testing is considered an integral part of a complete examination. Limited examinations for reoccurring indications may be performed as noted.  Right Carotid Findings: +----------+--------+--------+--------+------------------+--------+           PSV cm/sEDV cm/sStenosisPlaque DescriptionComments +----------+--------+--------+--------+------------------+--------+ CCA Prox  79                       heterogenous               +----------+--------+--------+--------+------------------+--------+ CCA Distal60      7               heterogenous               +----------+--------+--------+--------+------------------+--------+ ICA Prox  70      16      1-39%   heterogenous               +----------+--------+--------+--------+------------------+--------+ ICA Distal71      18                                         +----------+--------+--------+--------+------------------+--------+ ECA       91                                                 +----------+--------+--------+--------+------------------+--------+ +----------+--------+-------+--------+-------------------+           PSV cm/sEDV cmsDescribeArm Pressure (mmHG) +----------+--------+-------+--------+-------------------+ Subclavian221                                        +----------+--------+-------+--------+-------------------+ +---------+--------+--+--------+-+---------+  VertebralPSV cm/s26EDV cm/s7Antegrade +---------+--------+--+--------+-+---------+  Left Carotid Findings: +----------+--------+--------+--------+------------------+--------+           PSV cm/sEDV cm/sStenosisPlaque DescriptionComments +----------+--------+--------+--------+------------------+--------+ CCA Prox  106     8               heterogenous               +----------+--------+--------+--------+------------------+--------+ CCA Distal74      9               heterogenous               +----------+--------+--------+--------+------------------+--------+ ICA Prox  66      14      1-39%   heterogenous               +----------+--------+--------+--------+------------------+--------+ ICA Distal85      21                                         +----------+--------+--------+--------+------------------+--------+ ECA       92                                                  +----------+--------+--------+--------+------------------+--------+ +----------+--------+--------+--------+-------------------+           PSV cm/sEDV cm/sDescribeArm Pressure (mmHG) +----------+--------+--------+--------+-------------------+ [id number]                                         +----------+--------+--------+--------+-------------------+ +---------+--------+--+--------+--+---------+ VertebralPSV cm/s58EDV cm/s11Antegrade +---------+--------+--+--------+--+---------+   Summary: Right Carotid: Velocities in the right ICA are consistent with a 1-39% stenosis. Left Carotid: Velocities in the left ICA are consistent with a 1-39% stenosis. Vertebrals: Bilateral vertebral arteries demonstrate antegrade flow. *See table(s) above for measurements and observations.  Electronically signed by Jamelle Haring on 09/26/2020 at 4:28:40 PM.    Final     PHYSICAL EXAM Frail elderly African-American male not in distress. . Afebrile. Head is nontraumatic. Neck is supple without bruit.    Cardiac exam no murmur or gallop. Lungs are clear to auscultation. Distal pulses are well felt.  NEURO:  Mental Status: Awake, alert, and oriented to self, location, and situation. He was able to remember 1/3 words. He also states that the year is 2021. Speech is intact without dysarthria or aphasia.   Repetition, fluency, and comprehension intact. Patient is able to provide a clear and coherent history of present illness. No neglect noted.  Cranial Nerves:  II: PERRL 4 mm/brisk. Visual fields full.  III, IV, VI: EOMI without ptosis V: Sensation is intact to light touch and symmetrical to face.  VII: Face is symmetric resting and smiling.  VIII: Hearing is intact to voice IX, X: Palate elevation is symmetric. Phonation normal.  XI: Normal sternocleidomastoid and trapezius muscle strength XII: Tongue protrudes midline without fasciculations  Motor: 5/5 strength present in bilateral upper extremities.  Bilateral lower extremities with weakness secondary to recent cryptococcal meningitis (recent discharge from rehab, receiving home PT). Right lower extremity 3/5 strength with some antigravity movement but unable to sustain antigravity movement. Left lower extremity 4/5 strength: able to overcome gravity but with minimal elevation off of bed on assessment.  Tone and bulk are normal.   Sensation:  Intact to light touch bilaterally in all four extremities.   Coordination: FTN with subtle loss of fluid movement on left upper extremity. HKS unable to be assessed secondary to lower extremity weakness.  DTRs: Trace reflexes bilateral patellae, 2+ and symmetric biceps  Gait: Deferred   ASSESSMENT/PLAN [name] [name] is a 70 y.o. 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 He was ambulating outside with physical therapy when he had a sudden onset of lightheadedness with weakness causing him to lower himself to his knees. He states that he was assisted inside to the couch when he had a syncopal episode and his wife told him that his whole body stiffened up. Initially, he was found to be hypotensive and diaphoretic with EMS with improvement following IVF. In the ED he was found to have an elevated D-dimer prompting a CT PE which revealed bilateral pulmonary embolisms with questionable soft tissue mass and he was started on heparin and admitted for further evaluation . MRI brain shows 78mm acute ischemic infarct at right cerebellum and microvascular ischemic changes.    Stroke right cerebellum Code Stroke  CT head No acute abnormality.  Small vessel disease. Atrophy.  ASPECTS 10.      MRI 09/25/20 :  1. 8 mm acute ischemic nonhemorrhagic  infarct involving the inferior right cerebellum. 2. Mild-to-moderate chronic microvascular ischemic disease for age.  MRA   Head: 09/26/20  IMPRESSION: No anterior circulation abnormality seen.  Examination begins at the mid cerebellar level. We can see flow in PICA branches on both sides, but the PICA origins are not included. This examination could be repeated if desired. Distal vertebral arteries appear normal. Basilar artery is normal. Anterior inferior cerebellar arteries, superior cerebellar arteries and posterior cerebral arteries are normal.  Carotid Doppler   09/26/20   Summary:   Right Carotid: Velocities in the right ICA are consistent with a 1-39%   stenosis.    Left Carotid: Velocities in the left ICA are consistent with a 1-39%   stenosis.   Vertebrals: Bilateral vertebral arteries demonstrate antegrade flow.     LDL UNABLE TO CALCULATE IF TRIGLYCERIDE OVER 400 mg/dL  HgbA1c 6.9  VTE prophylaxis - lovenox 70mg  daily.     Diet   Diet heart healthy/carb modified Room service appropriate? Yes; Fluid consistency: Thin     aspirin 81 mg daily prior to admission, now on heparin IV and No antithrombotic for bilateral PE.    Therapy recommendations:   No OT follow up.   Disposition:  Home   Hypertension  Home meds:  Metoprolol 12.5mg  bid    Stable .  gradually normalize blood pressure  in 5-7 days . Long-term BP goal normotensive  Hyperlipidemia  Home meds:   none,  resumed in hospital  LDL UNABLE TO CALCULATE IF TRIGLYCERIDE OVER 400 mg/dL, goal < 70     High intensity statin  lipitor 80mg  started   Continue statin at discharge    HgbA1c 6.9, goal < 7.0  CBGs Recent Labs    09/26/20 1239 09/26/20 1721 09/27/20 0814  GLUCAP 207* 123* 201*      SSI  Other Stroke Risk Factors   Advanced Age >/= 41   Other Active Problems  Chronic kidney disease  Diabetes  Neuropathy.  Hospital day # 2   I have personally obtained  history,examined this patient, reviewed notes, independently viewed imaging studies, participated in medical decision making and plan of care.ROS completed by me personally and pertinent positives fully documented  I have made any additions or clarifications directly to the above note. Agree with note above.  Patient presented with a near syncopal event due to bilateral pulmonary embolism.  He has some impaired cognition at baseline from recent cryptococcal meningitis.  MRI scan shows small right cerebellar punctate infarct likely asymptomatic and incidental.  Continue IV heparin until patient is able to swallow satisfactorily and then changed to oral anticoagulation for any embolism.  Add statin for elevated lipids and aggressive risk factor modification.  Check echocardiogram results.  Greater than 50% time during this 35-minute visit were spent on counseling and coordination of care about his insulin cerebellar stroke and need for long-term anticoagulation given pulm embolism and answering questions.  Discussed with Dr. Darleene Cleaver MD  Antony Contras, MD Medical Director [place] Pager: [phone] 09/27/2020 1:37 PM   To contact Stroke Continuity provider, please refer to [email or web address] After hours, contact General Neurology

## 2020-09-27 NOTE — TOC Initial Note (Signed)
Transition of Care [facility]) - Initial/Assessment Note    Patient Details  Name: [name] [name] MRN: [id number] Date of Birth: 03/19/1951  [facility] CM/SW Contact:    Joanne Chars, LCSW Phone Number: 09/27/2020, 11:12 AM  Clinical Narrative:     CSW met with pt regarding discharge recommendation for [facility].  Pt already active with [facility], started two weeks ago.  Pt would like to continue these services.  Permission given to speak with wife and daughter.  Current equipment in home: walker, rollator, shower chair.  PCP in place.  Pt a little unclear about how many covid vaccine shots he has received--two listed in epic. Readmission risk items addressed.                Expected Discharge Plan: [place] Barriers to Discharge: Continued Medical Work up   Patient Goals and CMS Choice Patient states their goals for this hospitalization and ongoing recovery are:: "get back to walking" CMS Medicare.gov Compare Post Acute Care list provided to::  (NA-current with [facility], wants to continue)    Expected Discharge Plan and Services Expected Discharge Plan: [place]     Post Acute Care Choice: Resumption of Svcs/PTA Provider,Home Health Living arrangements for the past 2 months: Single Family Home                 DME Arranged: N/A         HH Arranged: PT,OT [place] Agency: [place] Date [facility] Agency Contacted: 09/27/20 Time HH Agency Contacted: 28 Representative spoke with at [place]: Tommi Rumps  Prior Living Arrangements/Services Living arrangements for the past 2 months: [place] with:: Adult Children,Spouse (lives with wife and adult daughter) Patient language and need for interpreter reviewed:: Yes Do you feel safe going back to the place where you live?: Yes      Need for Family Participation in Patient Care: No (Comment) Care giver support system in place?: Yes (comment) Current home services: Home  PT,Home OT Criminal Activity/Legal Involvement Pertinent to Current Situation/Hospitalization: No - Comment as needed  Activities of Daily Living Home Assistive Devices/Equipment: Environmental consultant (specify type),Eyeglasses,Shower chair with back ADL Screening (condition at time of admission) Patient's cognitive ability adequate to safely complete daily activities?: Yes Is the patient deaf or have difficulty hearing?: No Does the patient have difficulty seeing, even when wearing glasses/contacts?: No Does the patient have difficulty concentrating, remembering, or making decisions?: No Patient able to express need for assistance with ADLs?: Yes Does the patient have difficulty dressing or bathing?: No Independently performs ADLs?: Yes (appropriate for developmental age) Does the patient have difficulty walking or climbing stairs?: Yes Weakness of Legs: Both Weakness of Arms/Hands: None  Permission Sought/Granted Permission sought to share information with : Family Supports,Other (comment) Permission granted to share information with : Yes, Verbal Permission Granted  Share Information with NAME: wife [place], daughter Noemi [place]  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 Cerebral thrombosis with cerebral infarction 09/27/2020  . Pulmonary embolism ([place])  09/25/2020  . AMS (altered mental status) 07/06/2020  . Erectile dysfunction after radical prostatectomy  11/23/2019  . Anaphylactic shock, unspecified, initial encounter 10/17/2019  . Peripheral vascular disease ([place]) 10/17/2019  . Pain, unspecified 10/14/2019  . Pruritus, unspecified 10/14/2019  . Shortness of breath 10/14/2019  . Type 2 diabetes mellitus with diabetic neuropathy, unspecified ([place]) 10/14/2019  . Acute UTI 10/13/2019  . Delayed graft function of kidney 10/12/2019  . Postoperative fever 10/11/2019  . Immunosuppressed status ([place]) 10/09/2019  . Kidney transplanted 10/09/2019  . Prophylactic antibiotic 10/09/2019  . Status post placement of ureteral stent 10/09/2019  . Steroid-induced hyperglycemia 10/09/2019  . Allergy, unspecified, initial encounter 07/25/2019  . Hematemesis 06/14/2019  . COVID-19 virus infection 06/10/2019  . Fall at home, initial encounter 06/10/2019  . Syncope 06/10/2019  . Elevated troponin 06/10/2019  . Dependence on renal dialysis ([place]) 05/26/2018  . Cyst of kidney, acquired 11/29/2017  . Other long term (current) drug therapy 11/24/2017  . Unspecified jaundice 11/24/2017  . Elevated prostate specific antigen (PSA) 02/12/2017  . ESRD on peritoneal dialysis ([place]) 02/12/2017  . 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 Secondary hyperparathyroidism ([place]) 02/12/2017  . Malignant neoplasm of prostate ([place]) 02/08/2017  . Moderate protein-calorie malnutrition ([place]) 10/16/2016  . Iron deficiency anemia, unspecified 10/05/2016  . Hypokalemia 09/29/2016  . Anemia in chronic kidney disease 09/29/2016  . Coagulation defect, unspecified ([place]) 09/29/2016  . End stage renal disease ([place]) 09/29/2016  . Localized edema 09/29/2016  . Systemic inflammatory response syndrome (sirs) of non-infectious origin without acute organ dysfunction ([place]) 09/29/2016  . Pure hypercholesterolemia, unspecified 09/29/2016  . Preop cardiovascular exam 04/27/2016  . Diabetes mellitus type 2, controlled ([place]) 06/21/2015  . Other hyperlipidemia 12/11/2014  .  Essential hypertension 09/10/2014  . SIRS (systemic inflammatory response syndrome) ([place]) 08/01/2014  . Bronchitis   . Sepsis ([place]) 07/31/2014  . Syncope due to orthostatic hypotension 07/28/2012  . Chronic kidney disease, stage V ([place]) 07/28/2012  . Acute kidney failure ([place]) 07/28/2012  . Benign prostatic hyperplasia without lower urinary tract symptoms 06/25/2011  . Hereditary and idiopathic peripheral neuropathy 07/20/2007   PCP:  [name], [name], MD Pharmacy:   [place], [place] [address] [place] [address] Phone: [phone] Fax: [phone]  [id number] [id number]  [id number]: [phone]: [phone]    Social Determinants of Health (SDOH) Interventions    Readmission Risk Interventions No flowsheet data found.

## 2020-09-27 NOTE — Progress Notes (Signed)
K+ came back at 5.9>>confirm 5.7 on repeat this AM. Ok to give [facility] x2 per Dr. Ellsworth Lennox, PharmD, BCIDP, AAHIVP, CPP Infectious Disease Pharmacist 09/27/2020 9:21 AM

## 2020-09-27 NOTE — Evaluation (Addendum)
Physical Therapy Evaluation Patient Details Name: [name] [name] MRN: [id number] DOB: 1951-01-25 Today's Date: 09/27/2020   History of Present Illness  Pt is 70 yo male who presented after syncopal episode on 09/25/20.  Pt found to have bil PE without evidence of heart strain started on Heparin. Pt also had MRI that revealed acute R inferior cerebellar stroke. He has history of recent prolonged admission and inpatient rehabilitation starting February 2022 for cryptococcal meningitis, renal transplant, HTN, DM, arthritis  Clinical Impression  Pt admitted with/for syncope found to be in part due to bil PE and also found to have suffered and acute R inf cerebellar stroke.  Pt is mobilizing at a min guard level and showing progress toward supervision to mod I..  Pt currently limited functionally due to the problems listed below.  (see problems list.)  Pt will benefit from PT to maximize function and safety to be able to get home safely with available assist.     Follow Up Recommendations Home health PT;Supervision - Intermittent    Equipment Recommendations  None recommended by PT    Recommendations for Other Services       Precautions / Restrictions Precautions Precautions: Fall      Mobility  Bed Mobility Overal bed mobility: Needs Assistance Bed Mobility: Supine to Sit     Supine to sit: Supervision     General bed mobility comments: up in the chair on arrival    Transfers Overall transfer level: Needs assistance Equipment used: Rolling walker (2 wheeled);None Transfers: Sit to/from Stand Sit to Stand: Min guard         General transfer comment: min guard for safety  Ambulation/Gait Ambulation/Gait assistance: Min guard Gait Distance (Feet): 100 Feet (x2 with rest in between) Assistive device: Rolling walker (2 wheeled) Gait Pattern/deviations: Step-through pattern;Decreased stride length Gait velocity: decreased Gait velocity interpretation: 1.31 - 2.62 ft/sec,  indicative of limited community ambulator General Gait Details: decreased speed, but generally steady, normalized gait pattern  Stairs            Wheelchair Mobility    Modified Rankin (Stroke Patients Only) Modified Rankin (Stroke Patients Only) Modified Rankin: Moderate disability     Balance     Sitting balance-Leahy Scale: Normal     Standing balance support: No upper extremity supported;Bilateral upper extremity supported Standing balance-Leahy Scale: Fair Standing balance comment: able to complete standing activity/pre gait without AD                             Pertinent Vitals/Pain Pain Assessment: Faces Pain Intervention(s): Monitored during session    Home Living Family/patient expects to be discharged to:: Private residence Living Arrangements: Spouse/significant other;Children Available Help at Discharge: Family;Available 24 hours/day Type of Home: House Home Access: Stairs to enter Entrance Stairs-Rails: Psychiatric nurse of Steps: 2 Home Layout: One level Home Equipment: Clinical cytogeneticist - 2 wheels      Prior Function Level of Independence: Needs assistance   Gait / Transfers Assistance Needed: Pt was ambulating with RW ; he could ambulate short community distances  ADL's / Homemaking Assistance Needed: Reports basically independent but occasionally needed help with socks. Wife does IADLs.  Prior to February pt was completly independent.  Comments: Was getting HHPT s/p recent hospitalization     Hand Dominance   Dominant Hand: Right    Extremity/Trunk Assessment   Upper Extremity Assessment Upper Extremity Assessment: Overall WFL for tasks assessed  Lower Extremity Assessment Lower Extremity Assessment: Defer to PT evaluation    Cervical / Trunk Assessment Cervical / Trunk Assessment: Normal  Communication   Communication: No difficulties  Cognition Arousal/Alertness: Awake/alert Behavior During  Therapy: WFL for tasks assessed/performed Overall Cognitive Status: Within Functional Limits for tasks assessed                                        General Comments General comments (skin integrity, edema, etc.): SpO2 during gait 96% and HR 94 bpm    Exercises General Exercises - Lower Extremity Hip ABduction/ADduction: AROM;Strengthening;10 reps;Standing Hip Flexion/Marching: AROM;Strengthening;Both;10 reps;Standing Mini-Sqauts: AROM;5 reps;Standing   Assessment/Plan    PT Assessment    PT Problem List         PT Treatment Interventions      PT Goals (Current goals can be found in the Care Plan section)  Acute Rehab PT Goals Patient Stated Goal: return home PT Goal Formulation: With patient Time For Goal Achievement: 10/10/20 Potential to Achieve Goals: Good    Frequency Min 4X/week   Barriers to discharge        Co-evaluation               AM-PAC PT "6 Clicks" Mobility  Outcome Measure Help needed turning from your back to your side while in a flat bed without using bedrails?: A Little Help needed moving from lying on your back to sitting on the side of a flat bed without using bedrails?: A Little Help needed moving to and from a bed to a chair (including a wheelchair)?: A Little Help needed standing up from a chair using your arms (e.g., wheelchair or bedside chair)?: A Little Help needed to walk in hospital room?: A Little Help needed climbing 3-5 steps with a railing? : A Little 6 Click Score: 18    End of Session   Activity Tolerance: Patient tolerated treatment well Patient left: with chair alarm set;in chair;with call bell/phone within reach;with nursing/sitter in room Nurse Communication: Mobility status PT Visit Diagnosis: Other abnormalities of gait and mobility (R26.89);Muscle weakness (generalized) (M62.81)    Time: 5809-9833 PT Time Calculation (min) (ACUTE ONLY): 27 min   Charges:     PT Treatments $Gait Training:  8-22 mins $Therapeutic Exercise: 8-22 mins        09/27/2020  Ginger Carne., PT Acute Rehabilitation Services [phone]  (pager) [phone]  (office)  Tessie Fass Saraiya Kozma 09/27/2020, 12:42 PM

## 2020-09-27 NOTE — Progress Notes (Signed)
Echocardiogram reviewed- normal RV size and function. LVEF preserved, G1DD. VS stable- no tachycardia, Bps have remained stable. I agree with previous assessments that he is unlikely to benefit from advanced or invasive therapies for his PE.  -Agree with plans for DOAC  -Needs appropriate follow up with oncology for prostate cancer and all age appropriate cancer screening -Given that this PE was likely due to prolonged immobility from previous hospitalization, he likely does not require long-term AC. Needs 3 months total AC, then stop.   PCCM will sign off. Please call with questions.  Julian Hy, DO 09/27/20 5:45 PM [facility]

## 2020-09-28 DIAGNOSIS — I6330 Cerebral infarction due to thrombosis of unspecified cerebral artery: Secondary | ICD-10-CM

## 2020-09-28 LAB — BASIC METABOLIC PANEL
Anion gap: 5 (ref 5–15)
BUN: 25 mg/dL — ABNORMAL HIGH (ref 8–23)
CO2: 21 mmol/L — ABNORMAL LOW (ref 22–32)
Calcium: 9.1 mg/dL (ref 8.9–10.3)
Chloride: 107 mmol/L (ref 98–111)
Creatinine, Ser: 1.29 mg/dL — ABNORMAL HIGH (ref 0.61–1.24)
GFR, Estimated: 59 mL/min — ABNORMAL LOW (ref >60)
Glucose, Bld: 180 mg/dL — ABNORMAL HIGH (ref 70–99)
Potassium: 4.2 mmol/L (ref 3.5–5.1)
Sodium: 133 mmol/L — ABNORMAL LOW (ref 135–145)

## 2020-09-28 LAB — TRIGLYCERIDES: Triglycerides: 314 mg/dL — ABNORMAL HIGH (ref <150)

## 2020-09-28 LAB — GLUCOSE, CAPILLARY: Glucose-Capillary: 170 mg/dL — ABNORMAL HIGH (ref 70–99)

## 2020-09-28 MED ORDER — ROSUVASTATIN CALCIUM 20 MG PO TABS: 20.0000 mg | ORAL_TABLET | Freq: Every day | ORAL | 0 refills | Status: DC

## 2020-09-28 MED ORDER — APIXABAN 5 MG PO TABS: 5.0000 mg | ORAL_TABLET | Freq: Two times a day (BID) | ORAL | Status: DC

## 2020-09-28 MED ORDER — APIXABAN (ELIQUIS) VTE STARTER PACK (10MG AND 5MG): ORAL_TABLET | ORAL | 0 refills | Status: DC

## 2020-09-28 MED ORDER — APIXABAN 5 MG PO TABS: 10.0000 mg | ORAL_TABLET | Freq: Two times a day (BID) | ORAL | Status: DC

## 2020-09-28 NOTE — Discharge Instructions (Signed)
Information on my medicine - ELIQUIS (apixaban)   Why was Eliquis prescribed for you? Eliquis was prescribed to treat blood clots that may have been found in the veins of your legs (deep vein thrombosis) or in your lungs (pulmonary embolism) and to reduce the risk of them occurring again.  What do You need to know about Eliquis ? The starting dose is 10 mg (two 5 mg tablets) taken TWICE daily for the FIRST SEVEN (7) DAYS, then on Saturday May 21st (10/05/2020) the dose is reduced to ONE 5 mg tablet taken TWICE daily.  Eliquis may be taken with or without food.   Try to take the dose about the same time in the morning and in the evening. If you have difficulty swallowing the tablet whole please discuss with your pharmacist how to take the medication safely.  Take Eliquis exactly as prescribed and DO NOT stop taking Eliquis without talking to the doctor who prescribed the medication.  Stopping may increase your risk of developing a new blood clot.  Refill your prescription before you run out.  After discharge, you should have regular check-up appointments with your healthcare provider that is prescribing your Eliquis.    What do you do if you miss a dose? If a dose of ELIQUIS is not taken at the scheduled time, take it as soon as possible on the same day and twice-daily administration should be resumed. The dose should not be doubled to make up for a missed dose.  Important Safety Information A possible side effect of Eliquis is bleeding. You should call your healthcare provider right away if you experience any of the following: ? Bleeding from an injury or your nose that does not stop. ? Unusual colored urine (red or dark brown) or unusual colored stools (red or black). ? Unusual bruising for unknown reasons. ? A serious fall or if you hit your head (even if there is no bleeding).  Some medicines may interact with Eliquis and might increase your risk of bleeding or clotting while on  Eliquis. To help avoid this, consult your healthcare provider or pharmacist prior to using any new prescription or non-prescription medications, including herbals, vitamins, non-steroidal anti-inflammatory drugs (NSAIDs) and supplements.  This website has more information on Eliquis (apixaban): http://www.eliquis.com/eliquis/home

## 2020-09-28 NOTE — Progress Notes (Signed)
Right AV graft + bruit and thrill.

## 2020-09-28 NOTE — Discharge Summary (Signed)
Physician Discharge Summary  [name] [name] AOZ:[id number] DOB: February 13, 1951 DOA: 09/25/2020  PCP: [name], [name], MD  Admit date: 09/25/2020 Discharge date: 09/28/2020  Admitted From: Home Disposition: Home   Recommendations for Outpatient Follow-up:  1. Follow up with PCP is scheduled 5/17 at 11:30am. Recommend drawing tacrolimus level at that visit for trough and for this to be managed by the patient's transplant specialists at [facility], as the dose was recently decreased from 1mg  BID to 1mg  BID.  2. 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 3. Continue anticoagulation for PE.  Home Health: PT, OT Equipment/Devices: None new Discharge Condition: Stable CODE STATUS: Full Diet recommendation: Heart healthy  Brief/Interim Summary: [name] [name] is a 70 y.o. 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 He had felt weak while working at home with PT, had slow fall to his knees on the driveway, subsequently came inside to the couch where his wife describes an episode 4-5 minutes long of abrupt eyes rolling in back of head and rigidity with frothing at the mouth. He did not lose postural tone during this but was unresponsive. Afterwards he was poorly responsive, EMS called and he had another similar episode, associated with hypotension per EMS.   In the ED he was diagnosed with bibasilar R > L pulmonary emboli without hypoxia or pleuritic chest pain, a 2.5 cm left base pulmonary nodule (biopsy-proven cryptococcoma, slightly decreased from prior scan). Subsequent brain MRI revealed acute right inferior cerebellar stroke felt to be incidental to syncope but causing mild left-sided ataxia.  Discharge Diagnoses:  Principal Problem:   Syncope due to orthostatic  hypotension Active Problems:   Diabetes mellitus type 2, controlled ([name])   Iron deficiency anemia, unspecified   Pulmonary embolism ([name])   Cerebral thrombosis with cerebral infarction  Syncope, hypotension, seizure-like activity: EEG with mild diffuse slowing, no epileptogenic discharges/seizure activity captured. No dysrhythmias noted on cardiac monitoring.   Fall, deconditioning:  - Continue PT/OT at home  Acute bilateral PE: Oddly asymptomatic. Echocardiogram unremarkable.  - Converting lovenox to eliquis (preferred over xarelto with concomitant fluconazole Tx).   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 Now 2.5cm. - Pulmonary consulted nonurgently for their input: Recommended surveillance CT intermittently and continued fluconazole as below.  History of cryptococcal meningitis:  - Continue fluconazole 400mg  daily, with plan for 1 year of this followed by 200mg  dosing beginning 08/03/2021.  Acute right inferior cerebellar ischemic infarct: With mild left sided ataxia, not cause for syncope.  - Pt previously on ASA 81mg  daily, now on anticoagulation. - Neurology consulted.  - Echo without CES. - MRA was limited (see results), though showed no LVO. No recommendation to repeat by neurology.   Hyperlipidemia, hypertriglyceridemia: Lipid panel abnormal ?if not fasting sample. LDL 135, HDL 56, trig 432. Repeat triglycerides lower, though this should be followed by PCP.  - Crestor (less interactive with fluconazole than atorvastatin).   Hyperkalemia: History of this in 2018. Has been on ARB in the past.  - Confirmed by recheck. Give lokelma x2, recheck this PM and tomorrow AM. - Continue telemetry. No changes noted at this time.  Stage IIIa CKD (based on available lab values), history of ESRD now s/p renal transplant: SCr stable. - Continue tacrolimus  1 BID and prednisone 10mg  daily.  Also continuing atovaquone PJP ppx, acyclovir HSV ppx.  - Tacrolimus dose recently decreased after level was 14.6 (typical goal for renal transplant patients at trough is 3 to 7 ng/mL), will recheck level next week at PCP follow up. - Avoid nephrotoxins  IDT2DM: HbA1c 6.9% indicates chronic CBG at goal. No changes recommended.  HSV-2:  - Continue suppressive acyclovir  HTN:  - Restart norvasc 5mg , coreg 6.25mg  BID   Leukopenia:  - Stable.  Discharge Instructions Discharge Instructions    Diet - low sodium heart healthy   Complete by: As directed    Diet Carb Modified   Complete by: As directed    Discharge instructions   Complete by: As directed    You were found to have blood clots in the lungs and will need to take eliquis (blood thinner) for 3 months. This starts at a dose of 10mg  twice daily for 7 days, then down to 5mg  twice daily from then on. Take your first dose this evening. While on the blood thinner, you should stop taking aspirin.  You were found to have a smalled area in the lung from the area biopsied at [facility]. This will need to be monitored with CT scans intermittently to confirm its continued decrease in size.   You were found to have a small stroke in the cerebellum. A new medication, crestor, has been prescribed to lower cholesterol and reduce your risk of future strokes. You will also need to control blood pressure and diabetes well going forward to reduce the risk of future strokes.   The tacrolimus level should be rechecked about Tuesday morning which is when your follow up with Dr. Jerilee Hoh is scheduled.  If your symptoms return, seek medical attention right away.   Increase activity slowly   Complete by: As directed      Allergies as of 09/28/2020      Reactions   Other Rash   Oxyquinoline-white Pet-lanolin   No Known Allergies    Bag Balm  [albolene] Rash      Medication List    STOP taking these medications   albuterol 108 (90 Base) MCG/ACT  inhaler Commonly known as: VENTOLIN HFA   aspirin 81 MG tablet   cefTRIAXone 1 g in sodium chloride 0.9 % 100 mL   enoxaparin 30 MG/0.3ML injection Commonly known as: LOVENOX   insulin glargine 100 UNIT/ML injection Commonly known as: LANTUS   labetalol 5 MG/ML injection Commonly known as: NORMODYNE   metoprolol tartrate 25 MG tablet Commonly known as: LOPRESSOR   ondansetron 4 MG tablet Commonly known as: ZOFRAN   ondansetron 4 MG/2ML Soln injection Commonly known as: ZOFRAN   pantoprazole 40 MG tablet Commonly known as: Protonix   traZODone 50 MG tablet Commonly known as: DESYREL   valGANciclovir 450 MG tablet Commonly known as: VALCYTE     TAKE these medications   acetaminophen 325 MG tablet Commonly known as: TYLENOL Take 2 tablets (650 mg total) by mouth every 6 (six) hours as needed for mild pain (or Fever >/= 101). What changed: Another medication with the same name was removed. Continue taking this medication, and follow the directions you see here.   acyclovir 200 MG capsule Commonly known as: ZOVIRAX Take 400 mg by mouth 2 (two) times daily.   amLODipine 5 MG tablet Commonly known as: NORVASC Take 1 tablet (5 mg total) by mouth daily.   Apixaban Starter Pack (10mg  and 5mg ) Commonly known as:  ELIQUIS STARTER PACK Take as directed on package: start with two-5mg  tablets twice daily for 7 days. On day 8, switch to one-5mg  tablet twice daily.   atovaquone 750 MG/5ML suspension Commonly known as: MEPRON Take 1,500 mg by mouth every morning.   carvedilol 6.25 MG tablet Commonly known as: COREG Take 6.25 mg by mouth 2 (two) times daily.   [facility] Senna Plus 8.6-50 MG tablet Generic drug: senna-docusate Take 2 tablets by mouth daily.   famotidine 20 MG tablet Commonly known as: PEPCID Take 20 mg by mouth 2 (two) times daily.   fluconazole 200 MG tablet Commonly known as: DIFLUCAN Take 400 mg by mouth daily.   folic acid 1 MG tablet Commonly known  as: FOLVITE Take 1 mg by mouth daily.   insulin aspart 100 UNIT/ML injection Commonly known as: novoLOG Inject 0-15 Units into the skin 4 (four) times daily - after meals and at bedtime. What changed: how much to take   lidocaine 5 % Commonly known as: LIDODERM Place 1 patch onto the skin See admin instructions. Remove & Discard patch within 12 hours or as directed by MD   Melatonin 3 MG Tbdp Take 6 mg by mouth at bedtime.   oxybutynin 5 MG 24 hr tablet Commonly known as: DITROPAN-XL Take 5 mg by mouth daily.   predniSONE 5 MG tablet Commonly known as: DELTASONE Take 10 mg by mouth daily with breakfast.   rosuvastatin 20 MG tablet Commonly known as: CRESTOR Take 1 tablet (20 mg total) by mouth daily.   tacrolimus 1 MG capsule Commonly known as: PROGRAF Take 1 mg by mouth 2 (two) times daily.   thiamine 100 MG tablet Take 100 mg by mouth daily.   vitamin D3 50 MCG (2000 UT) Caps Take 2,000 Units by mouth daily.       Follow-up Information    [name], [name], MD Follow up on 10/01/2020.   Specialty: Internal Medicine Why: Please attend your hospital follow up appointment with Dr Jerilee Hoh on Tuesday, 10/01/20, at 11:00am.  Contact information: [place] [address] [phone]        [facility], [facility] Follow up.   Specialty: Home Health Services Why: Alvis Lemmings will contact you within 24-48  to schedule an appointment to resume services. Contact information: [place] [address] [phone]              Allergies  Allergen Reactions  . Other Rash    Oxyquinoline-white Pet-lanolin  . No Known Allergies   . Bag Balm  [Albolene] Rash    Consultations:  Neurology  PCCM  Procedures/Studies: CT Angio Chest PE W and/or Wo Contrast  Result Date: 09/25/2020 CLINICAL DATA:  Near syncopal episode EXAM: CT ANGIOGRAPHY CHEST WITH CONTRAST TECHNIQUE: Multidetector CT imaging of the chest  was performed using the standard protocol during bolus administration of intravenous contrast. Multiplanar CT image reconstructions and MIPs were obtained to evaluate the vascular anatomy. CONTRAST:  19mL OMNIPAQUE IOHEXOL 350 MG/ML SOLN COMPARISON:  Chest x-ray from earlier in the same day, CT from 01/16/2020. FINDINGS: Cardiovascular: Thoracic aorta demonstrates atherosclerotic calcifications without aneurysmal dilatation or dissection. Heart is at the upper limits of normal in size. Pulmonary artery demonstrates a normal branching pattern bilaterally. There are filling defects identified primarily within the lower lobe branches on the right and 2 Eloesser degree in the lower lobe branches on the left. No definitive right heart strain is noted. No coronary calcifications are  seen. Mediastinum/Nodes: Thoracic inlet is within normal limits. The esophagus as visualized is within normal limits. Small left hilar and prevascular nodes are seen measuring less than 1 cm in diameter. Lungs/Pleura: Lungs are well aerated bilaterally. There is a 2.5 cm rounded density identified in the left lower lobe laterally. Small associated hilar lymph nodes are seen. The lungs are otherwise well aerated without focal infiltrate or sizable effusion. Upper Abdomen: Visualized upper abdomen shows no acute abnormality. Musculoskeletal: No chest wall abnormality. No acute or significant osseous findings. Review of the MIP images confirms the above findings. IMPRESSION: Findings consistent with lower lobe pulmonary emboli worse on the right than the left without evidence of right heart strain. 2.5 cm soft tissue mass lesion in the posterior costophrenic angle on the left new from a prior CT examination obtained in August of 2021. Neoplasm deserves consideration and further workup is recommended. Aortic Atherosclerosis (ICD10-I70.0). Critical Value/emergent results were called by telephone at the time of interpretation on 09/25/2020 at 6:22 pm  to Dr. Kathrynn Humble, who verbally acknowledged these results. Electronically Signed   By: Inez Catalina M.D.   On: 09/25/2020 18:31   MR ANGIO HEAD WO CONTRAST  Result Date: 09/26/2020 CLINICAL DATA:  Acute stroke inferior right cerebellum seen by MRI yesterday EXAM: MRA HEAD WITHOUT CONTRAST TECHNIQUE: Angiographic images of the Circle of Willis were acquired using MRA technique without intravenous contrast. COMPARISON:  MRI 09/25/2020 FINDINGS: Anterior circulation: Both internal carotid arteries are widely patent through the skull base and siphon regions. The anterior and middle cerebral vessels appear normal without proximal stenosis, aneurysm or vascular malformation. No vessel occlusion is identified. Posterior circulation: Both vertebral arteries are patent at the level of the mid cerebellum. The examination did not begin at the foramen magnum. There appears to be flow in posteroinferior cerebellar artery branches on both sides with the PICA origins are not included. This examination could be repeated if desired. Both anterior inferior cerebellar arteries show flow. Both superior cerebellar and posterior cerebral arteries appear normal. Patent posterior communicating arteries are present. Anatomic variants: None significant. Other: None IMPRESSION: No anterior circulation abnormality seen. Examination begins at the mid cerebellar level. We can see flow in PICA branches on both sides, but the PICA origins are not included. This examination could be repeated if desired. Distal vertebral arteries appear normal. Basilar artery is normal. Anterior inferior cerebellar arteries, superior cerebellar arteries and posterior cerebral arteries are normal. Electronically Signed   By: Nelson Chimes M.D.   On: 09/26/2020 18:33   MR BRAIN WO CONTRAST  Result Date: 09/26/2020 CLINICAL DATA:  Initial evaluation for acute syncope. EXAM: MRI HEAD WITHOUT CONTRAST TECHNIQUE: Multiplanar, multiecho pulse sequences of the brain  and surrounding structures were obtained without intravenous contrast. COMPARISON:  Prior MRI from 06/14/2019. FINDINGS: Brain: Generalized age-related cerebral volume loss. Patchy T2/FLAIR hyperintensity within the periventricular deep white matter both cerebral hemispheres most consistent with chronic small vessel ischemic disease, mild to moderate in nature. Patchy involvement of the pons noted. 8 mm focus of restricted diffusion seen involving the inferior right cerebellum, consistent with an acute ischemic infarct (series 5, image 62). No associated hemorrhage or mass effect. No other diffusion abnormality to suggest acute or subacute ischemia. Gray-white matter differentiation otherwise maintained. No other areas of encephalomalacia to suggest chronic cortical infarction. No other evidence for acute or chronic intracranial hemorrhage. No mass lesion, midline shift or mass effect. No hydrocephalus or extra-axial fluid collection. Pituitary gland suprasellar region within normal limits. Midline  structures intact. Vascular: Major intracranial vascular flow voids are maintained. Skull and upper cervical spine: Craniocervical junction within normal limits. Bone marrow signal intensity normal. No scalp soft tissue abnormality. Sinuses/Orbits: Globes and orbital soft tissues within normal limits. Paranasal sinuses are largely clear. No mastoid effusion. Inner ear structures grossly normal. Other: None. IMPRESSION: 1. 8 mm acute ischemic nonhemorrhagic infarct involving the inferior right cerebellum. 2. Mild-to-moderate chronic microvascular ischemic disease for age. Electronically Signed   By: Jeannine Boga M.D.   On: 09/26/2020 00:27   DG Chest Port 1 View  Result Date: 09/25/2020 CLINICAL DATA:  near syncope EXAM: PORTABLE CHEST 1 VIEW COMPARISON:  01/16/2020 FINDINGS: Unchanged cardiomediastinal silhouette. There is no new focal airspace disease. There is no pleural effusion or visible pneumothorax.  Skin fold overlying the left peripheral thorax. There is no acute osseous abnormality. IMPRESSION: No evidence of acute cardiopulmonary disease. Electronically Signed   By: Maurine Simmering   On: 09/25/2020 13:32   EEG adult  Result Date: 09/26/2020 Lora Havens, MD     09/26/2020  5:09 PM Patient Name: [name] [name] MRN: [id number] Epilepsy Attending: Lora Havens Referring Physician/Provider: Clint Bolder, NP Date: 09/26/2020 Duration: 28.34 mins Patient history: 70 year old male with PMHx significant for HTN, type 2 DM, renal transplant in May 2021 on immunosuppression, and cryptococcal meningitis due to immunosuppression who presented for evaluation of near syncope. EEG to evaluate for seizure Level of alertness: Awake, asleep AEDs during EEG study: None Technical aspects: This EEG study was done with scalp electrodes positioned according to the 10-20 International system of electrode placement. Electrical activity was acquired at a sampling rate of 500Hz  and reviewed with a high frequency filter of 70Hz  and a low frequency filter of 1Hz . EEG data were recorded continuously and digitally stored. Description: The posterior dominant rhythm consists of 8-9 Hz activity of moderate voltage (25-35 uV) seen predominantly in posterior head regions, symmetric and reactive to eye opening and eye closing.  Sleep was characterized by vertex waves,  maximal frontocentral region.  EEG showed intermittent generalized p3 to 6 Hz theta-delta slowing. Hyperventilation and photic stimulation were not performed.   ABNORMALITY - Intermittent slow, generalized IMPRESSION: This study is suggestive of mild diffuse encephalopathy, nonspecific etiology. No seizures or epileptiform discharges were seen throughout the recording. Lora Havens   ECHOCARDIOGRAM COMPLETE BUBBLE STUDY  Result Date: 09/27/2020    ECHOCARDIOGRAM REPORT   Patient Name:   [name] [name] Brumett Date of Exam: 09/27/2020 Medical Rec #:  [id number]          Height:       68.0 in Accession #:    [id number]        Weight:       153.0 lb Date of Birth:  [birth year]         BSA:          1.824 m Patient Age:    70 years          BP:           143/79 mmHg Patient Gender: M                 HR:           67 bpm. Exam Location:  Inpatient Procedure: 2D Echo and Saline Contrast Bubble Study Indications:    syncope  History:        Patient has prior history of Echocardiogram examinations, most  recent 02/26/2017. Chronic kidney disease; Risk                 Factors:Diabetes.  Sonographer:    Johny Chess Referring Phys: Arabi  1. Left ventricular ejection fraction, by estimation, is 60 to 65%. The left ventricle has normal function. The left ventricle has no regional wall motion abnormalities. Left ventricular diastolic parameters are consistent with Grade I diastolic dysfunction (impaired relaxation).  2. Right ventricular systolic function is normal. The right ventricular size is normal.  3. The mitral valve is normal in structure. Trivial mitral valve regurgitation. No evidence of mitral stenosis.  4. The aortic valve is tricuspid. There is mild calcification of the aortic valve. Aortic valve regurgitation is not visualized. No aortic stenosis is present.  5. The inferior vena cava is normal in size with greater than 50% respiratory variability, suggesting right atrial pressure of 3 mmHg.  6. Agitated saline contrast bubble study was negative, with no evidence of any interatrial shunt. FINDINGS  Left Ventricle: Left ventricular ejection fraction, by estimation, is 60 to 65%. The left ventricle has normal function. The left ventricle has no regional wall motion abnormalities. The left ventricular internal cavity size was normal in size. There is  no left ventricular hypertrophy. Left ventricular diastolic parameters are consistent with Grade I diastolic dysfunction (impaired relaxation). Right Ventricle: The right ventricular size  is normal. No increase in right ventricular wall thickness. Right ventricular systolic function is normal. Left Atrium: Left atrial size was normal in size. Right Atrium: Right atrial size was normal in size. Pericardium: There is no evidence of pericardial effusion. Mitral Valve: The mitral valve is normal in structure. Trivial mitral valve regurgitation. No evidence of mitral valve stenosis. Tricuspid Valve: The tricuspid valve is normal in structure. Tricuspid valve regurgitation is trivial. No evidence of tricuspid stenosis. Aortic Valve: The aortic valve is tricuspid. There is mild calcification of the aortic valve. Aortic valve regurgitation is not visualized. No aortic stenosis is present. Pulmonic Valve: The pulmonic valve was normal in structure. Pulmonic valve regurgitation is trivial. No evidence of pulmonic stenosis. Aorta: The aortic root is normal in size and structure. Venous: The inferior vena cava is normal in size with greater than 50% respiratory variability, suggesting right atrial pressure of 3 mmHg. IAS/Shunts: No atrial level shunt detected by color flow Doppler. Agitated saline contrast was given intravenously to evaluate for intracardiac shunting. Agitated saline contrast bubble study was negative, with no evidence of any interatrial shunt.  LEFT VENTRICLE PLAX 2D LVIDd:         5.10 cm  Diastology LVIDs:         3.40 cm  LV e' medial:    7.62 cm/s LV PW:         0.90 cm  LV E/e' medial:  13.8 LV IVS:        0.80 cm  LV e' lateral:   10.10 cm/s LVOT diam:     1.80 cm  LV E/e' lateral: 10.4 LV SV:         63 LV SV Index:   34 LVOT Area:     2.54 cm  RIGHT VENTRICLE             IVC RV S prime:     15.10 cm/s  IVC diam: 2.00 cm TAPSE (M-mode): 2.5 cm LEFT ATRIUM             Index       RIGHT  ATRIUM           Index LA diam:        4.00 cm 2.19 cm/m  RA Area:     13.20 cm LA Vol (A2C):   60.8 ml 33.34 ml/m RA Volume:   29.40 ml  16.12 ml/m LA Vol (A4C):   47.7 ml 26.15 ml/m LA Biplane Vol:  54.1 ml 29.66 ml/m  AORTIC VALVE LVOT Vmax:   106.00 cm/s LVOT Vmean:  68.000 cm/s LVOT VTI:    0.246 m  AORTA Ao Root diam: 2.90 cm Ao Asc diam:  2.90 cm MITRAL VALVE MV Area (PHT): 3.53 cm     SHUNTS MV Decel Time: 215 msec     Systemic VTI:  0.25 m MV E velocity: 105.00 cm/s  Systemic Diam: 1.80 cm MV A velocity: 110.00 cm/s MV E/A ratio:  0.95 Glori Bickers MD Electronically signed by Glori Bickers MD Signature Date/Time: 09/27/2020/4:10:15 PM    Final    VAS US CAROTID  Result Date: 09/26/2020 Carotid Arterial Duplex Study Patient Name:  [name] [name] Distler  Date of Exam:   09/26/2020 Medical Rec #: [id number]          Accession #:    [id number] Date of Birth: 12/29/1950          Patient Gender: M Patient Age:   69Y Exam Location:  [facility] Procedure:      VAS US CAROTID Referring Phys: [id number] EKTA V PATEL --------------------------------------------------------------------------------  Indications:       Syncope. Risk Factors:      Hypertension, Diabetes. Comparison Study:  no prior Performing Technologist: Abram Sander RVS  Examination Guidelines: A complete evaluation includes B-mode imaging, spectral Doppler, color Doppler, and power Doppler as needed of all accessible portions of each vessel. Bilateral testing is considered an integral part of a complete examination. Limited examinations for reoccurring indications may be performed as noted.  Right Carotid Findings: +----------+--------+--------+--------+------------------+--------+           PSV cm/sEDV cm/sStenosisPlaque DescriptionComments +----------+--------+--------+--------+------------------+--------+ CCA Prox  79                      heterogenous               +----------+--------+--------+--------+------------------+--------+ CCA Distal60      7               heterogenous               +----------+--------+--------+--------+------------------+--------+ ICA Prox  70      16      1-39%   heterogenous                +----------+--------+--------+--------+------------------+--------+ ICA Distal71      18                                         +----------+--------+--------+--------+------------------+--------+ ECA       91                                                 +----------+--------+--------+--------+------------------+--------+ +----------+--------+-------+--------+-------------------+           PSV cm/sEDV cmsDescribeArm Pressure (mmHG) +----------+--------+-------+--------+-------------------+ Subclavian221                                        +----------+--------+-------+--------+-------------------+ +---------+--------+--+--------+-+---------+  VertebralPSV cm/s26EDV cm/s7Antegrade +---------+--------+--+--------+-+---------+  Left Carotid Findings: +----------+--------+--------+--------+------------------+--------+           PSV cm/sEDV cm/sStenosisPlaque DescriptionComments +----------+--------+--------+--------+------------------+--------+ CCA Prox  106     8               heterogenous               +----------+--------+--------+--------+------------------+--------+ CCA Distal74      9               heterogenous               +----------+--------+--------+--------+------------------+--------+ ICA Prox  66      14      1-39%   heterogenous               +----------+--------+--------+--------+------------------+--------+ ICA Distal85      21                                         +----------+--------+--------+--------+------------------+--------+ ECA       92                                                 +----------+--------+--------+--------+------------------+--------+ +----------+--------+--------+--------+-------------------+           PSV cm/sEDV cm/sDescribeArm Pressure (mmHG) +----------+--------+--------+--------+-------------------+ [id number]                                          +----------+--------+--------+--------+-------------------+ +---------+--------+--+--------+--+---------+ VertebralPSV cm/s58EDV cm/s11Antegrade +---------+--------+--+--------+--+---------+   Summary: Right Carotid: Velocities in the right ICA are consistent with a 1-39% stenosis. Left Carotid: Velocities in the left ICA are consistent with a 1-39% stenosis. Vertebrals: Bilateral vertebral arteries demonstrate antegrade flow. *See table(s) above for measurements and observations.  Electronically signed by Jamelle Haring on 09/26/2020 at 4:28:40 PM.    Final       Subjective: Feels well, eating well, at functional baseline, no further LOC episodes. Wants to go home.  Discharge Exam: Vitals:   09/28/20 0754 09/28/20 0757  BP:  96/74  Pulse:  72  Resp:  12  Temp:  97.6 F (36.4 C)  SpO2: 99%    General: Pt is alert, awake, not in acute distress Cardiovascular: RRR, S1/S2 +, no rubs, no gallops Respiratory: CTA bilaterally, no wheezing, no rhonchi Abdominal: Soft, NT, ND, bowel sounds + Extremities: No edema, no cyanosis  Labs: Basic Metabolic Panel: Recent Labs  Lab 09/25/20 1257 09/27/20 0112 09/27/20 0812 09/27/20 1759 09/28/20 0112  NA 135 135 134*  --  133*  K 4.3 5.9* 5.7* 5.1 4.2  CL 108 110 108  --  107  CO2 20* 19* 17*  --  21*  GLUCOSE 297* 268* 206*  --  180*  BUN 34* 28* 26*  --  25*  CREATININE 1.69* 1.43* 1.33*  --  1.29*  CALCIUM 8.6* 9.0 8.8*  --  9.1   Liver Function Tests: No results for input(s): AST, ALT, ALKPHOS, BILITOT, PROT, ALBUMIN in the last 168 hours. No results for input(s): LIPASE, AMYLASE in the last 168 hours. No results for input(s): AMMONIA in the last 168 hours. CBC: Recent Labs  Lab 09/25/20 1257 09/26/20 0234 09/27/20 0112  WBC  1.7* 3.6* 3.3*  NEUTROABS 1.1*  --   --   HGB 11.8* 11.3* 10.8*  HCT 35.9* 33.6* 31.6*  MCV 96.2 95.5 93.5  PLT 207 164 171   Cardiac Enzymes: No results for input(s): CKTOTAL, CKMB, CKMBINDEX,  TROPONINI in the last 168 hours. BNP: Invalid input(s): POCBNP CBG: Recent Labs  Lab 09/26/20 1721 09/27/20 0814 09/27/20 1254 09/27/20 1632 09/28/20 0808  GLUCAP 123* 201* 174* 236* 170*   D-Dimer Recent Labs    09/25/20 1257  DDIMER 13.56*   Hgb A1c Recent Labs    09/25/20 2230  HGBA1C 6.9*   Lipid Profile Recent Labs    09/27/20 0112 09/28/20 0112  CHOL 259*  --   HDL 56  --   LDLCALC UNABLE TO CALCULATE IF TRIGLYCERIDE OVER 400 mg/dL  --   TRIG 432* 314*  CHOLHDL 4.6  --   LDLDIRECT 135.4*  --    Thyroid function studies Recent Labs    09/25/20 2230  TSH 2.061   Anemia work up Recent Labs    09/26/20 0101  VITAMINB12 1,206*  FOLATE 37.5  FERRITIN 197  TIBC 211*  IRON 43*  RETICCTPCT 1.7   Urinalysis    Component Value Date/Time   COLORURINE YELLOW 07/05/2020 2233   APPEARANCEUR HAZY (A) 07/05/2020 2233   LABSPEC 1.014 07/05/2020 2233   PHURINE 5.0 07/05/2020 2233   GLUCOSEU NEGATIVE 07/05/2020 2233   HGBUR SMALL (A) 07/05/2020 2233   HGBUR trace-lysed 06/16/2010 0833   BILIRUBINUR NEGATIVE 07/05/2020 2233   BILIRUBINUR n 05/22/2015 0927   KETONESUR 5 (A) 07/05/2020 2233   PROTEINUR 30 (A) 07/05/2020 2233   UROBILINOGEN 0.2 05/22/2015 0927   UROBILINOGEN 0.2 07/31/2014 2130   NITRITE NEGATIVE 07/05/2020 2233   LEUKOCYTESUR LARGE (A) 07/05/2020 2233    Microbiology Recent Results (from the past 240 hour(s))  Resp Panel by RT-PCR (Flu A&B, Covid) Nasopharyngeal Swab     Status: None   Collection Time: 09/25/20 11:43 PM   Specimen: Nasopharyngeal Swab; Nasopharyngeal(NP) swabs in vial transport medium  Result Value Ref Range Status   SARS Coronavirus 2 by RT PCR NEGATIVE NEGATIVE Final    Comment: (NOTE) SARS-CoV-2 target nucleic acids are NOT DETECTED.  The SARS-CoV-2 RNA is generally detectable in upper respiratory specimens during the acute phase of infection. The lowest concentration of SARS-CoV-2 viral copies this assay can  detect is 138 copies/mL. A negative result does not preclude SARS-Cov-2 infection and should not be used as the sole basis for treatment or other patient management decisions. A negative result may occur with  improper specimen collection/handling, submission of specimen other than nasopharyngeal swab, presence of viral mutation(s) within the areas targeted by this assay, and inadequate number of viral copies(<138 copies/mL). A negative result must be combined with clinical observations, patient history, and epidemiological information. The expected result is Negative.  Fact Sheet for Patients:  EntrepreneurPulse.com.au  Fact Sheet for Healthcare Providers:  IncredibleEmployment.be  This test is no t yet approved or cleared by the [place] FDA and  has been authorized for detection and/or diagnosis of SARS-CoV-2 by FDA under an Emergency Use Authorization (EUA). This EUA will remain  in effect (meaning this test can be used) for the duration of the COVID-19 declaration under Section 564(b)(1) of the Act, 21 U.S.C.section 360bbb-3(b)(1), unless the authorization is terminated  or revoked sooner.       Influenza A by PCR NEGATIVE NEGATIVE Final   Influenza B by PCR NEGATIVE NEGATIVE Final  Comment: (NOTE) The Xpert Xpress SARS-CoV-2/FLU/RSV plus assay is intended as an aid in the diagnosis of influenza from Nasopharyngeal swab specimens and should not be used as a sole basis for treatment. Nasal washings and aspirates are unacceptable for Xpert Xpress SARS-CoV-2/FLU/RSV testing.  Fact Sheet for Patients: EntrepreneurPulse.com.au  Fact Sheet for Healthcare Providers: IncredibleEmployment.be  This test is not yet approved or cleared by the [place] FDA and has been authorized for detection and/or diagnosis of SARS-CoV-2 by FDA under an Emergency Use Authorization (EUA). This EUA will remain in  effect (meaning this test can be used) for the duration of the COVID-19 declaration under Section 564(b)(1) of the Act, 21 U.S.C. section 360bbb-3(b)(1), unless the authorization is terminated or revoked.  Performed at [facility], [place] [address], [place], [place] [address]     Time coordinating discharge: Approximately 40 minutes  Patrecia Pour, MD  [facility] 09/28/2020, 9:38 AM

## 2020-09-28 NOTE — TOC Transition Note (Signed)
Transition of Care [facility]) - CM/SW Discharge Note   Patient Details  Name: [name] [name] MRN: [id number] Date of Birth: 1950/10/21  [facility] CM/SW Contact:  Carles Collet, RN Phone Number: 09/28/2020, 7:25 AM   Clinical Narrative:   Patient noted to be active w [facility] and wishes to continue [facility] services. Resumption order placed for HH, and [facility] notified of DC.      Barriers to Discharge: Continued Medical Work up   Patient Goals and CMS Choice Patient states their goals for this hospitalization and ongoing recovery are:: "get back to walking" CMS Medicare.gov Compare Post Acute Care list provided to::  (NA-current with [facility], wants to continue)    Discharge Placement                       Discharge Plan and Services     Post Acute Care Choice: Resumption of Svcs/PTA Provider,Home Health          DME Arranged: N/A         HH Arranged: PT,OT HH Agency: [place] Date [facility] Agency Contacted: 09/27/20 Time [place]: 1110 Representative spoke with at [place]: [place] ([place]) Interventions     Readmission Risk Interventions Readmission Risk Prevention Plan 09/27/2020  Transportation Screening Complete  PCP or Specialist appointment within 3-5 days of discharge Complete  HRI or [place] Complete  SW Recovery Care/Counseling Consult Complete  [place] Not Applicable  Some recent data might be hidden

## 2020-09-28 NOTE — Progress Notes (Signed)
[place] for heparin>>Lovenox Indication: pulmonary embolus  Allergies  Allergen Reactions  . Other Rash    Oxyquinoline-white Pet-lanolin  . No Known Allergies   . Bag Balm  [Albolene] Rash    Patient Measurements: Weight: 69.4 kg (153 lb) Heparin Dosing Weight: 70 KG  Vital Signs: Temp: 97.6 F (36.4 C) (05/14 0757) Temp Source: Oral (05/14 0757) BP: 96/74 (05/14 0757) Pulse Rate: 72 (05/14 0757)  Labs: Recent Labs    09/25/20 1257 09/25/20 1520 09/26/20 0234 09/26/20 1243 09/27/20 0112 09/27/20 0812 09/28/20 0112  HGB 11.8*  --  11.3*  --  10.8*  --   --   HCT 35.9*  --  33.6*  --  31.6*  --   --   PLT 207  --  164  --  171  --   --   HEPARINUNFRC  --   --  0.80* 0.98*  --   --   --   CREATININE 1.69*  --   --   --  1.43* 1.33* 1.29*  TROPONINIHS 18* 23*  --   --   --   --   --     Estimated Creatinine Clearance: 50.8 mL/min (A) (by C-G formula based on SCr of 1.29 mg/dL (H)).   Assessment: 70 y/o M with chief complaint of lightheadedness and weakness. 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 CT PE positive for PE.  Patient was started on heparin infusion on 5/11 and was transitioned to receive therapeutic enoxaparin on 5/12. Pharmacy now consulted to transition patient to apixaban for oral anticoagulation.   CBC (5/13): Hgb 10.8, Plt 171   Goal of Therapy:  Monitor platelets by anticoagulation protocol: Yes   Plan:  Dc enoxaparin 70mg  q12h  Give first dose of apixaban on 5/14 at 1800  Start apixaban 10mg  BID x7 days then 5mg  BID thereafter F/u closely for s/sx bleeding and CBC as needed   Wilson Singer, PharmD PGY1 Pharmacy Resident 09/28/2020 8:01 AM

## 2020-09-28 NOTE — Progress Notes (Signed)
All set for discharge home, discharge instruction given to pt. Awaiting ride home.

## 2020-09-30 ENCOUNTER — Telehealth: Payer: Self-pay

## 2020-09-30 NOTE — Telephone Encounter (Cosign Needed)
Transition Care Management Follow-up Telephone Call  Date of discharge and from where: 09/28/2020  [facility]   How have you been since you were released from the hospital? Doing Well   Any questions or concerns? No  Items Reviewed:  Did the pt receive and understand the discharge instructions provided? Yes   Medications obtained and verified? Yes   Other? No   Any new allergies since your discharge? No   Dietary orders reviewed? Yes  Do you have support at home? Yes   Home Care and Equipment/Supplies: Were home health services ordered? yes If so, what is the name of the agency? [facility]  Has the agency set up a time to come to the patient's home? yes Were any new equipment or medical supplies ordered?  No What is the name of the medical supply agency? n/a Were you able to get the supplies/equipment? not applicable Do you have any questions related to the use of the equipment or supplies? No  Functional Questionnaire: (I = Independent and D = Dependent) ADLs: I  Bathing/Dressing- D  Meal Prep- D  Eating- I  Maintaining continence- I  Transferring/Ambulation- I  Managing Meds- D  Follow up appointments reviewed:   PCP Hospital f/u appt confirmed? Yes  Scheduled to see Dr. Jerilee Hoh  on 10/01/2020 @ 11:30  Specialist Hospital f/u appt confirmed? No  Pt contacted [facility] to schedule   Are transportation arrangements needed? No   If their condition worsens, is the pt aware to call PCP or go to the Emergency Dept.? Yes  Was the patient provided with contact information for the PCP's office or ED? Yes  Was to pt encouraged to call back with questions or concerns? Yes

## 2020-10-01 ENCOUNTER — Other Ambulatory Visit: Payer: Self-pay

## 2020-10-01 ENCOUNTER — Ambulatory Visit (INDEPENDENT_AMBULATORY_CARE_PROVIDER_SITE_OTHER): Payer: Medicare Other | Admitting: Internal Medicine

## 2020-10-01 ENCOUNTER — Encounter: Payer: Self-pay | Admitting: Internal Medicine

## 2020-10-01 VITALS — BP 94/56 | HR 67 | Temp 97.4°F | Wt 129.9 lb

## 2020-10-01 DIAGNOSIS — I2694 Multiple subsegmental pulmonary emboli without acute cor pulmonale: Secondary | ICD-10-CM | POA: Diagnosis not present

## 2020-10-01 DIAGNOSIS — I6330 Cerebral infarction due to thrombosis of unspecified cerebral artery: Secondary | ICD-10-CM | POA: Diagnosis not present

## 2020-10-01 DIAGNOSIS — Z940 Kidney transplant status: Secondary | ICD-10-CM

## 2020-10-01 DIAGNOSIS — Z09 Encounter for follow-up examination after completed treatment for conditions other than malignant neoplasm: Secondary | ICD-10-CM

## 2020-10-01 DIAGNOSIS — B451 Cerebral cryptococcosis: Secondary | ICD-10-CM

## 2020-10-01 DIAGNOSIS — E1140 Type 2 diabetes mellitus with diabetic neuropathy, unspecified: Secondary | ICD-10-CM | POA: Diagnosis not present

## 2020-10-01 LAB — COMPREHENSIVE METABOLIC PANEL
ALT: 24 U/L (ref 0–53)
AST: 22 U/L (ref 0–37)
Albumin: 4 g/dL (ref 3.5–5.2)
Alkaline Phosphatase: 59 U/L (ref 39–117)
BUN: 37 mg/dL — ABNORMAL HIGH (ref 6–23)
CO2: 15 mEq/L — ABNORMAL LOW (ref 19–32)
Calcium: 9.3 mg/dL (ref 8.4–10.5)
Chloride: 105 mEq/L (ref 96–112)
Creatinine, Ser: 2.08 mg/dL — ABNORMAL HIGH (ref 0.40–1.50)
GFR: 31.39 mL/min — ABNORMAL LOW (ref >60.00)
Glucose, Bld: 336 mg/dL — ABNORMAL HIGH (ref 70–99)
Potassium: 4.7 mEq/L (ref 3.5–5.1)
Sodium: 135 mEq/L (ref 135–145)
Total Bilirubin: 0.3 mg/dL (ref 0.2–1.2)
Total Protein: 6.4 g/dL (ref 6.0–8.3)

## 2020-10-01 LAB — CBC WITH DIFFERENTIAL/PLATELET
Basophils Absolute: 0 10*3/uL (ref 0.0–0.1)
Basophils Relative: 1.1 % (ref 0.0–3.0)
Eosinophils Absolute: 0.1 10*3/uL (ref 0.0–0.7)
Eosinophils Relative: 2.5 % (ref 0.0–5.0)
HCT: 31.4 % — ABNORMAL LOW (ref 39.0–52.0)
Hemoglobin: 11 g/dL — ABNORMAL LOW (ref 13.0–17.0)
Lymphocytes Relative: 18 % (ref 12.0–46.0)
Lymphs Abs: 0.5 10*3/uL — ABNORMAL LOW (ref 0.7–4.0)
MCHC: 35.1 g/dL (ref 30.0–36.0)
MCV: 95.4 fl (ref 78.0–100.0)
Monocytes Absolute: 0.6 10*3/uL (ref 0.1–1.0)
Monocytes Relative: 20.5 % — ABNORMAL HIGH (ref 3.0–12.0)
Neutro Abs: 1.7 10*3/uL (ref 1.4–7.7)
Neutrophils Relative %: 57.9 % (ref 43.0–77.0)
Platelets: 240 10*3/uL (ref 150.0–400.0)
RBC: 3.29 Mil/uL — ABNORMAL LOW (ref 4.22–5.81)
RDW: 14.4 % (ref 11.5–15.5)
WBC: 3 10*3/uL — ABNORMAL LOW (ref 4.0–10.5)

## 2020-10-01 NOTE — Patient Instructions (Signed)
-  Nice seeing you today!!  -Lab work today; will notify you once results are available.  -Schedule follow up in 4 months or sooner as needed.

## 2020-10-01 NOTE — Progress Notes (Signed)
Hospital follow-up visit     CC/Reason for Visit: Hospitalization follow-up  HPI: [name] [name] is a 70 y.o. male who is coming in today for the above mentioned reasons, specifically transitional care services face-to-face visit.    Dates hospitalized: 09/25/2020-09/28/2020 Days since discharge from hospital: 3 Patient was discharged from the hospital to: Home Reason for admission to hospital: Syncope, weakness Date of interactive phone contact with patient and/or caregiver: 09/30/2020  I have reviewed in detail patient's discharge summary plus pertinent specific notes, labs, and images from the hospitalization.  Yes  Patient received a renal transplant in May 2021.  Last time I saw him was prior to this.  This is all managed through [facility].  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.  He is now on maintenance fluconazole therapy.  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  He was hospitalized this time after a syncopal event at home.  He was found to have an acute right cerebellar CVA as well as a new onset pulmonary embolism.  He was started on Eliquis.  He did develop hyperkalemia during this hospitalization requiring treatment.  He feels like he is back to baseline.  He is scheduled to see all his specialists at [facility] starting next week.  He has already had his initial visit by physical therapy after his hospitalization.  Medication reconciliation was done today and patient is taking meds as recommended by discharging hospitalist/specialist.  Yes   Past Medical/Surgical History: Past Medical History:  Diagnosis Date  . Anemia    as a younger man  . Arthritis    hands  . Cataract   . Chronic kidney disease    "starting to bother me now" (07/28/2012)  . Diabetes mellitus     type II  . Diarrhea, functional   . ED (erectile dysfunction)   . Hypertension   .  Neuropathy   . Peripheral neuropathy     Past Surgical History:  Procedure Laterality Date  . APPENDECTOMY  1950's  . AV FISTULA PLACEMENT Right 05/14/2016   Procedure: ARTERIOVENOUS (AV) FISTULA CREATION- RIGHT ARM;  Surgeon: Serafina Mitchell, MD;  Location: [facility];  Service: Vascular;  Laterality: Right;  . COLONOSCOPY    . KIDNEY TRANSPLANT Right   . PROSTATE SURGERY    . UPPER GASTROINTESTINAL ENDOSCOPY      Social History:  reports that he quit smoking about 34 years ago. His smoking use included cigarettes. He has a 1.20 pack-year smoking history. He has never used smokeless tobacco. He reports current alcohol use of about 1.0 standard drink of alcohol per week. He reports that he does not use drugs.  Allergies: Allergies  Allergen Reactions  . Other Rash    Oxyquinoline-white Pet-lanolin  . No Known Allergies   . Bag Balm  [Albolene] Rash    Family History:  Family History  Problem Relation Age of Onset  . Colon polyps Mother   . Stroke Mother   . Diabetes Father   . Diabetes Cousin   . Prostate cancer Brother   . Other Daughter        kidney problems  . Esophageal cancer Maternal Aunt   . Colon cancer Neg Hx   . Stomach cancer Neg Hx   . Rectal cancer Neg Hx      Current Outpatient Medications:  .  acetaminophen (TYLENOL) 325 MG tablet, Take  2 tablets (650 mg total) by mouth every 6 (six) hours as needed for mild pain (or Fever >/= 101)., Disp: 30 tablet, Rfl: 0 .  acyclovir (ZOVIRAX) 200 MG capsule, Take 400 mg by mouth 2 (two) times daily., Disp: , Rfl:  .  amLODipine (NORVASC) 5 MG tablet, Take 1 tablet (5 mg total) by mouth daily., Disp: 30 tablet, Rfl: 0 .  APIXABAN (ELIQUIS) VTE STARTER PACK (10MG  AND 5MG ), Take as directed on package: start with two-5mg  tablets twice daily for 7 days. On day 8, switch to one-5mg  tablet twice daily., Disp: 1 each, Rfl: 0 .  atovaquone (MEPRON) 750 MG/5ML suspension, Take 1,500 mg by mouth every morning., Disp: , Rfl:  .   carvedilol (COREG) 6.25 MG tablet, Take 6.25 mg by mouth 2 (two) times daily., Disp: , Rfl:  .  Cholecalciferol (VITAMIN D3) 50 MCG (2000 UT) CAPS, Take 2,000 Units by mouth daily., Disp: , Rfl:  .  [facility] SENNA PLUS 8.6-50 MG tablet, Take 2 tablets by mouth daily., Disp: , Rfl:  .  famotidine (PEPCID) 20 MG tablet, Take 20 mg by mouth 2 (two) times daily., Disp: , Rfl:  .  fluconazole (DIFLUCAN) 200 MG tablet, Take 400 mg by mouth daily., Disp: , Rfl:  .  folic acid (FOLVITE) 1 MG tablet, Take 1 mg by mouth daily., Disp: , Rfl:  .  insulin aspart (NOVOLOG) 100 UNIT/ML injection, Inject 0-15 Units into the skin 4 (four) times daily - after meals and at bedtime. (Patient taking differently: Inject 5 Units into the skin 4 (four) times daily - after meals and at bedtime.), Disp: 10 mL, Rfl: 0 .  lidocaine (LIDODERM) 5 %, Place 1 patch onto the skin See admin instructions. Remove & Discard patch within 12 hours or as directed by MD, Disp: , Rfl:  .  Melatonin 3 MG TBDP, Take 6 mg by mouth at bedtime., Disp: , Rfl:  .  oxybutynin (DITROPAN-XL) 5 MG 24 hr tablet, Take 5 mg by mouth daily., Disp: , Rfl:  .  predniSONE (DELTASONE) 5 MG tablet, Take 10 mg by mouth daily with breakfast., Disp: , Rfl:  .  rosuvastatin (CRESTOR) 20 MG tablet, Take 1 tablet (20 mg total) by mouth daily., Disp: 30 tablet, Rfl: 0 .  tacrolimus (PROGRAF) 1 MG capsule, Take 1 mg by mouth 2 (two) times daily., Disp: , Rfl:  .  thiamine 100 MG tablet, Take 100 mg by mouth daily., Disp: , Rfl:   Review of Systems:  Constitutional: Denies fever, chills, diaphoresis, appetite change. HEENT: Denies photophobia, eye pain, redness, hearing loss, ear pain, congestion, sore throat, rhinorrhea, sneezing, mouth sores, trouble swallowing, neck pain, neck stiffness and tinnitus.   Respiratory: Denies SOB, DOE, cough, chest tightness,  and wheezing.   Cardiovascular: Denies chest pain, palpitations and leg swelling.  Gastrointestinal: Denies  nausea, vomiting, abdominal pain, diarrhea, constipation, blood in stool and abdominal distention.  Genitourinary: Denies dysuria, urgency, frequency, hematuria, flank pain and difficulty urinating.  Endocrine: Denies: hot or cold intolerance, sweats, changes in hair or nails, polyuria, polydipsia. Musculoskeletal: Denies myalgias, back pain, joint swelling, arthralgias and gait problem.  Skin: Denies pallor, rash and wound.  Neurological: Denies dizziness, seizures, syncope, weakness, light-headedness, numbness and headaches.  Hematological: Denies adenopathy. Easy bruising, personal or family bleeding history  Psychiatric/Behavioral: Denies suicidal ideation, mood changes, confusion, nervousness, sleep disturbance and agitation    Physical Exam: Vitals:   10/01/20 1101  BP: (!) 94/56  Pulse: 67  Temp: (!) 97.4 F (36.3 C)  TempSrc: Oral  SpO2: 97%  Weight: 129 lb 14.4 oz (58.9 kg)    Body mass index is 19.75 kg/m.   Constitutional: NAD, calm, comfortable, ambulates with a walker Eyes: PERRL, lids and conjunctivae normal, wears corrective lenses ENMT: Mucous membranes are moist. Respiratory: clear to auscultation bilaterally, no wheezing, no crackles. Normal respiratory effort. No accessory muscle use.  Cardiovascular: Regular rate and rhythm, no murmurs / rubs / gallops. No extremity edema. Marland Kitchen  Psychiatric: Normal judgment and insight. Alert and oriented x 3. Normal mood.    Impression and Plan:  Hospital discharge follow-up  Cerebral thrombosis with cerebral infarction  Multiple subsegmental pulmonary emboli without acute cor pulmonale ([name])  Cryptococcal meningitis ([place]) - Plan: CBC with Differential/Platelet, Comprehensive metabolic panel  Type 2 diabetes mellitus with diabetic neuropathy, without long-term current use of insulin ([place]) - Plan: CBC with Differential/Platelet, Comprehensive metabolic panel  Kidney transplanted - Plan: Tacrolimus Level  -I have  performed extensive chart review both from recent hospitalization at [facility] referenced above as well as prolonged hospitalization at [facility]. -He continues his physical and occupational therapy posthospitalization, he has follow-ups with his multiple specialists at [facility] starting next week. -I will order labs as requested by discharging hospitalist including a tacrolimus level that will be forwarded to his transplant team at [facility] due to dose changes this hospitalization.  Chemistries will also be ordered to follow hyperkalemia during hospitalization. -I have conferred with him regarding compliance with Eliquis.  He is taking it as prescribed.   Medical decision making of high complexity was utilized today.  Patient Instructions  -Nice seeing you today!!  -Lab work today; will notify you once results are available.  -Schedule follow up in 4 months or sooner as needed.     Lelon Frohlich, MD [place] [facility]

## 2020-10-04 LAB — TACROLIMUS LEVEL: Tacrolimus Lvl: 9.1 ng/mL (ref 2.0–20.0)

## 2020-10-24 DIAGNOSIS — Z8673 Personal history of transient ischemic attack (TIA), and cerebral infarction without residual deficits: Secondary | ICD-10-CM | POA: Insufficient documentation

## 2020-10-24 DIAGNOSIS — R739 Hyperglycemia, unspecified: Secondary | ICD-10-CM | POA: Insufficient documentation

## 2020-10-25 DIAGNOSIS — N179 Acute kidney failure, unspecified: Secondary | ICD-10-CM | POA: Insufficient documentation

## 2020-11-06 ENCOUNTER — Telehealth: Payer: Self-pay | Admitting: Internal Medicine

## 2020-11-06 MED ORDER — ROSUVASTATIN CALCIUM 20 MG PO TABS: 20.0000 mg | ORAL_TABLET | Freq: Every day | ORAL | 0 refills | Status: DC

## 2020-11-06 NOTE — Telephone Encounter (Signed)
Pt daughter call and stated he need a refill onrosuvastatin (CRESTOR) 20 MG tablet sent to  [id number] - [place], [place] [address] Phone:  [phone]  Fax:  [phone]

## 2020-11-25 ENCOUNTER — Ambulatory Visit: Payer: Medicare Other | Admitting: Podiatry

## 2020-11-27 ENCOUNTER — Other Ambulatory Visit: Payer: Self-pay

## 2020-11-27 ENCOUNTER — Telehealth: Payer: Self-pay | Admitting: Internal Medicine

## 2020-11-27 ENCOUNTER — Ambulatory Visit (INDEPENDENT_AMBULATORY_CARE_PROVIDER_SITE_OTHER): Payer: Medicare Other | Admitting: Podiatry

## 2020-11-27 ENCOUNTER — Encounter: Payer: Self-pay | Admitting: Podiatry

## 2020-11-27 DIAGNOSIS — N185 Chronic kidney disease, stage 5: Secondary | ICD-10-CM

## 2020-11-27 DIAGNOSIS — N179 Acute kidney failure, unspecified: Secondary | ICD-10-CM

## 2020-11-27 DIAGNOSIS — B351 Tinea unguium: Secondary | ICD-10-CM

## 2020-11-27 DIAGNOSIS — E1142 Type 2 diabetes mellitus with diabetic polyneuropathy: Secondary | ICD-10-CM | POA: Diagnosis not present

## 2020-11-27 DIAGNOSIS — M79674 Pain in right toe(s): Secondary | ICD-10-CM

## 2020-11-27 DIAGNOSIS — M79675 Pain in left toe(s): Secondary | ICD-10-CM

## 2020-11-27 NOTE — Telephone Encounter (Signed)
Larene Beach from [facility] called today stating that patient's blood sugar has dropped. She said that he is on a low carb diet.   She says that he was given 5 units before eating and afterwards, his sugar dropped into the 30s.   Larene Beach said that she is not sure if medication dosage needs to be adjusted but she says that she just wanted to let Dr. Jerilee Hoh know.

## 2020-11-27 NOTE — Progress Notes (Signed)
This patient returns to my office for at risk foot care.  This patient requires this care by a professional since this patient will be at risk due to having ESRD and diabetes. Patient has not been seen in over 6 months.  This patient is unable to cut nails himself since the patient cannot reach his nails.These nails are painful walking and wearing shoes. Patient has painful callus on both feet. This patient presents for at risk foot care today.  General Appearance  Alert, conversant and in no acute stress.  Vascular  Dorsalis pedis and posterior tibial  pulses are palpable  bilaterally.  Capillary return is within normal limits  bilaterally. Temperature is within normal limits  bilaterally.  Neurologic  Senn-Weinstein monofilament wire test within normal limits  bilaterally. Muscle power within normal limits bilaterally.  Nails Thick disfigured discolored nails with subungual debris  from hallux to fifth toes bilaterally. No evidence of bacterial infection or drainage bilaterally.  Orthopedic  No limitations of motion  feet .  No crepitus or effusions noted.  No bony pathology or digital deformities noted.  Plantar flexed met heads 2-4  B/L.  Skin  normotropic skin noted bilaterally.  No signs of infections or ulcers noted.     Onychomycosis  Pain in right toes  Pain in left toes  Porokeratosis  B/L.  Consent was obtained for treatment procedures.   Mechanical debridement of nails 1-5  bilaterally performed with a nail nipper.  Filed with dremel without incident.    Return office visit   3 months                   Told patient to return for periodic foot care and evaluation due to potential at risk complications.   Gardiner Barefoot DPM

## 2020-11-27 NOTE — Telephone Encounter (Signed)
Spoke with patient and Dr Ledell Noss recommendation was given.

## 2020-12-31 ENCOUNTER — Telehealth: Payer: Self-pay | Admitting: *Deleted

## 2020-12-31 ENCOUNTER — Telehealth: Payer: Self-pay | Admitting: Internal Medicine

## 2020-12-31 NOTE — Telephone Encounter (Signed)
Noted  

## 2020-12-31 NOTE — Telephone Encounter (Signed)
Patient dropped off a form that he would like Dr. Jerilee Hoh to complete.  Patient would like a call at [phone] once forms are completed and ready for pickup.  Forms will be placed in folder.  Please advise.

## 2020-12-31 NOTE — Telephone Encounter (Signed)
Nurse (PT) called to inform Dr Jerilee Hoh that the patient has been having intermittent chest pain.  His glucose levels have been in range.    Left message on machine for patient to return our call.

## 2020-12-31 NOTE — Telephone Encounter (Signed)
2nd attempt. Left message on machine for patient to return our call. 

## 2021-01-01 NOTE — Telephone Encounter (Signed)
Patient states he misssed a call from the office yesterday and he had dropped off paperwork to be signed.  He will be at [facility] today until after 2:;0--requesting a call back after 2:00 today.  Message left with after hours nurse.

## 2021-01-02 NOTE — Telephone Encounter (Signed)
Attempted to call patient but mailbox is full.  

## 2021-01-03 NOTE — Telephone Encounter (Signed)
Attempt to call but unable to leave a message due to mailbox being full

## 2021-01-07 NOTE — Telephone Encounter (Signed)
Left message on machine for patient to return our call 

## 2021-01-07 NOTE — Telephone Encounter (Signed)
Spoke with patient.  He will pick up his paperwork for his nephrologist to complete.

## 2021-01-21 ENCOUNTER — Other Ambulatory Visit: Payer: Self-pay | Admitting: Internal Medicine

## 2021-01-30 LAB — HEMOGLOBIN A1C: Hemoglobin A1C: 7.2

## 2021-02-03 ENCOUNTER — Other Ambulatory Visit: Payer: Self-pay

## 2021-02-04 ENCOUNTER — Ambulatory Visit (INDEPENDENT_AMBULATORY_CARE_PROVIDER_SITE_OTHER): Payer: Medicare Other | Admitting: Internal Medicine

## 2021-02-04 ENCOUNTER — Encounter: Payer: Self-pay | Admitting: Internal Medicine

## 2021-02-04 VITALS — BP 120/70 | HR 69 | Temp 98.1°F | Wt 125.7 lb

## 2021-02-04 DIAGNOSIS — E1159 Type 2 diabetes mellitus with other circulatory complications: Secondary | ICD-10-CM

## 2021-02-04 DIAGNOSIS — I6330 Cerebral infarction due to thrombosis of unspecified cerebral artery: Secondary | ICD-10-CM

## 2021-02-04 DIAGNOSIS — B451 Cerebral cryptococcosis: Secondary | ICD-10-CM

## 2021-02-04 DIAGNOSIS — I2694 Multiple subsegmental pulmonary emboli without acute cor pulmonale: Secondary | ICD-10-CM

## 2021-02-04 DIAGNOSIS — Z940 Kidney transplant status: Secondary | ICD-10-CM | POA: Diagnosis not present

## 2021-02-04 DIAGNOSIS — E1140 Type 2 diabetes mellitus with diabetic neuropathy, unspecified: Secondary | ICD-10-CM | POA: Diagnosis not present

## 2021-02-04 DIAGNOSIS — E1169 Type 2 diabetes mellitus with other specified complication: Secondary | ICD-10-CM

## 2021-02-04 DIAGNOSIS — I152 Hypertension secondary to endocrine disorders: Secondary | ICD-10-CM

## 2021-02-04 DIAGNOSIS — E785 Hyperlipidemia, unspecified: Secondary | ICD-10-CM

## 2021-02-04 NOTE — Patient Instructions (Signed)
-  Nice seeing you today!!  -Schedule follow up in 4 months for your physical. Please come in fasting that day.

## 2021-02-04 NOTE — Progress Notes (Signed)
Established Patient Office Visit     This visit occurred during the SARS-CoV-2 public health emergency.  Safety protocols were in place, including screening questions prior to the visit, additional usage of staff PPE, and extensive cleaning of exam room while observing appropriate contact time as indicated for disinfecting solutions.    CC/Reason for Visit: Follow-up chronic medical conditions  HPI: [name] [name] is a 70 y.o. male who is coming in today for the above mentioned reasons. 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  He also has a history of hypertension, hyperlipidemia and insulin-dependent diabetes.  He has been following routinely with his transplant team at [facility] which includes an endocrinologist.  He tells me he saw the Endo last week and was surprised that his insulin dose was decreased because now he is having hyperglycemia, he has a call out to them as of yesterday.  He got his flu vaccine on September 15, he will be getting a COVID booster at [facility] this coming week.  He is feeling well, he has had no further syncopal events.   Past Medical/Surgical History: Past Medical History:  Diagnosis Date   Anemia    as a younger man   Arthritis    hands   Cataract    Chronic kidney disease    "starting to bother me now" (07/28/2012)   Diabetes mellitus     type II   Diarrhea, functional    ED (erectile dysfunction)    Hypertension    Neuropathy    Peripheral neuropathy     Past Surgical History:  Procedure Laterality Date   APPENDECTOMY  1950's   AV FISTULA PLACEMENT Right 05/14/2016   Procedure: ARTERIOVENOUS (AV) FISTULA CREATION- RIGHT ARM;  Surgeon: Serafina Mitchell, MD;  Location: [facility];  Service: Vascular;  Laterality: Right;   COLONOSCOPY     KIDNEY TRANSPLANT Right    PROSTATE SURGERY     UPPER GASTROINTESTINAL ENDOSCOPY       Social History:  reports that he quit smoking about 34 years ago. His smoking use included cigarettes. He has a 1.20 pack-year smoking history. He has never used smokeless tobacco. He reports current alcohol use of about 1.0 standard drink per week. He reports that he does not use drugs.  Allergies: Allergies  Allergen Reactions   Other Rash    Oxyquinoline-white Pet-lanolin   No Known Allergies    Bag Balm  [Albolene] Rash    Family History:  Family History  Problem Relation Age of Onset   Colon polyps Mother    Stroke Mother    Diabetes Father    Diabetes Cousin    Prostate cancer Brother    Other Daughter        kidney problems   Esophageal cancer Maternal Aunt    Colon cancer Neg Hx    Stomach cancer Neg Hx    Rectal cancer Neg Hx      Current Outpatient Medications:    acetaminophen (TYLENOL) 325 MG tablet, Take 2 tablets (650 mg total) by mouth every 6 (six) hours as needed for mild pain (or Fever >/= 101)., Disp: 30 tablet, Rfl: 0   acyclovir (ZOVIRAX) 200 MG capsule, Take 400 mg by mouth 2 (two) times daily., Disp: , Rfl:    amLODipine (NORVASC) 5 MG tablet, Take 1 tablet (5 mg total) by mouth daily., Disp: 30 tablet, Rfl: 0  APIXABAN (ELIQUIS) VTE STARTER PACK (10MG  AND 5MG ), Take as directed on package: start with two-5mg  tablets twice daily for 7 days. On day 8, switch to one-5mg  tablet twice daily., Disp: 1 each, Rfl: 0   atovaquone (MEPRON) 750 MG/5ML suspension, Take 1,500 mg by mouth every morning., Disp: , Rfl:    carvedilol (COREG) 6.25 MG tablet, Take 6.25 mg by mouth 2 (two) times daily., Disp: , Rfl:    Cholecalciferol (VITAMIN D3) 50 MCG (2000 UT) CAPS, Take 2,000 Units by mouth daily., Disp: , Rfl:    [facility] SENNA PLUS 8.6-50 MG tablet, Take 2 tablets by mouth daily., Disp: , Rfl:    famotidine (PEPCID) 20 MG tablet, Take 20 mg by mouth 2 (two) times daily., Disp: , Rfl:    fluconazole (DIFLUCAN) 200 MG tablet, Take 400 mg by mouth daily., Disp: , Rfl:     folic acid (FOLVITE) 1 MG tablet, Take 1 mg by mouth daily., Disp: , Rfl:    insulin aspart (NOVOLOG) 100 UNIT/ML injection, Inject 0-15 Units into the skin 4 (four) times daily - after meals and at bedtime. (Patient taking differently: Inject 5 Units into the skin 4 (four) times daily - after meals and at bedtime.), Disp: 10 mL, Rfl: 0   lidocaine (LIDODERM) 5 %, Place 1 patch onto the skin See admin instructions. Remove & Discard patch within 12 hours or as directed by MD, Disp: , Rfl:    Melatonin 3 MG TBDP, Take 6 mg by mouth at bedtime., Disp: , Rfl:    oxybutynin (DITROPAN-XL) 5 MG 24 hr tablet, Take 5 mg by mouth daily., Disp: , Rfl:    predniSONE (DELTASONE) 5 MG tablet, Take 10 mg by mouth daily with breakfast., Disp: , Rfl:    rosuvastatin (CRESTOR) 20 MG tablet, TAKE 1 TABLET BY MOUTH EVERY DAY, Disp: 90 tablet, Rfl: 1   tacrolimus (PROGRAF) 1 MG capsule, Take 1 mg by mouth 2 (two) times daily., Disp: , Rfl:    thiamine 100 MG tablet, Take 100 mg by mouth daily., Disp: , Rfl:   Review of Systems:  Constitutional: Denies fever, chills, diaphoresis, appetite change and fatigue.  HEENT: Denies photophobia, eye pain, redness, hearing loss, ear pain, congestion, sore throat, rhinorrhea, sneezing, mouth sores, trouble swallowing, neck pain, neck stiffness and tinnitus.   Respiratory: Denies SOB, DOE, cough, chest tightness,  and wheezing.   Cardiovascular: Denies chest pain, palpitations and leg swelling.  Gastrointestinal: Denies nausea, vomiting, abdominal pain, diarrhea, constipation, blood in stool and abdominal distention.  Genitourinary: Denies dysuria, urgency, frequency, hematuria, flank pain and difficulty urinating.  Endocrine: Denies: hot or cold intolerance, sweats, changes in hair or nails, polyuria, polydipsia. Musculoskeletal: Denies myalgias, back pain, joint swelling, arthralgias and gait problem.  Skin: Denies pallor, rash and wound.  Neurological: Denies dizziness,  seizures, syncope, weakness, light-headedness, numbness and headaches.  Hematological: Denies adenopathy. Easy bruising, personal or family bleeding history  Psychiatric/Behavioral: Denies suicidal ideation, mood changes, confusion, nervousness, sleep disturbance and agitation    Physical Exam: Vitals:   02/04/21 1124  BP: 120/70  Pulse: 69  Temp: 98.1 F (36.7 C)  TempSrc: Oral  Weight: 125 lb 11.2 oz (57 kg)    Body mass index is 19.11 kg/m.   Constitutional: NAD, calm, comfortable, ambulates with a cane Eyes: PERRL, lids and conjunctivae normal ENMT: Mucous membranes are moist.  Respiratory: clear to auscultation bilaterally, no wheezing, no crackles. Normal respiratory effort. No accessory muscle use.  Cardiovascular: Regular rate and  rhythm, no murmurs / rubs / gallops. No extremity edema.  Psychiatric: Normal judgment and insight. Alert and oriented x 3. Normal mood.    Impression and Plan:  Multiple subsegmental pulmonary emboli without acute cor pulmonale ([name]) -Remains on apixaban.  Type 2 diabetes mellitus with diabetic neuropathy, without long-term current use of insulin ([name]) -A1c is increased slightly to 7.2 today from 6.9 in May. -He will be calling his endocrinologist to relay this information, especially in light of his recent hyperglycemia.  Kidney transplanted -Noted.  Hypertension associated with diabetes ([place]) -Well-controlled at 120/70 today.  Hyperlipidemia associated with type 2 diabetes mellitus ([place]) -Check lipids next visit when he returns fasting.  Cryptococcal meningitis ([place]) -Followed by infectious diseases at [facility].  Time spent: 32 minutes reviewing chart, interviewing and examining patient and formulating plan of care.   Patient Instructions  -Nice seeing you today!!  -Schedule follow up in 4 months for your physical. Please come in fasting that day.    Lelon Frohlich, MD [facility]

## 2021-02-18 DIAGNOSIS — H25013 Cortical age-related cataract, bilateral: Secondary | ICD-10-CM | POA: Diagnosis not present

## 2021-02-18 DIAGNOSIS — E119 Type 2 diabetes mellitus without complications: Secondary | ICD-10-CM | POA: Diagnosis not present

## 2021-03-05 ENCOUNTER — Ambulatory Visit (INDEPENDENT_AMBULATORY_CARE_PROVIDER_SITE_OTHER): Payer: Medicare Other | Admitting: Podiatry

## 2021-03-05 ENCOUNTER — Encounter: Payer: Self-pay | Admitting: Podiatry

## 2021-03-05 ENCOUNTER — Other Ambulatory Visit: Payer: Self-pay

## 2021-03-05 DIAGNOSIS — M79675 Pain in left toe(s): Secondary | ICD-10-CM

## 2021-03-05 DIAGNOSIS — N185 Chronic kidney disease, stage 5: Secondary | ICD-10-CM

## 2021-03-05 DIAGNOSIS — E1142 Type 2 diabetes mellitus with diabetic polyneuropathy: Secondary | ICD-10-CM | POA: Diagnosis not present

## 2021-03-05 DIAGNOSIS — B351 Tinea unguium: Secondary | ICD-10-CM

## 2021-03-05 DIAGNOSIS — M722 Plantar fascial fibromatosis: Secondary | ICD-10-CM

## 2021-03-05 DIAGNOSIS — M79674 Pain in right toe(s): Secondary | ICD-10-CM | POA: Diagnosis not present

## 2021-03-05 DIAGNOSIS — N179 Acute kidney failure, unspecified: Secondary | ICD-10-CM | POA: Diagnosis not present

## 2021-03-05 NOTE — Progress Notes (Signed)
This patient returns to my office for at risk foot care.  This patient requires this care by a professional since this patient will be at risk due to having ESRD and diabetes. Patient has not been seen in over 6 months.  This patient is unable to cut nails himself since the patient cannot reach his nails.These nails are painful walking and wearing shoes.  This patient presents for at risk foot care today. Patient says he has arch pain left foot.  General Appearance  Alert, conversant and in no acute stress.  Vascular  Dorsalis pedis and posterior tibial  pulses are palpable  bilaterally.  Capillary return is within normal limits  bilaterally. Temperature is within normal limits  bilaterally.  Neurologic  Senn-Weinstein monofilament wire test within normal limits  bilaterally. Muscle power within normal limits bilaterally.  Nails Thick disfigured discolored nails with subungual debris  from hallux to fifth toes bilaterally. No evidence of bacterial infection or drainage bilaterally.  Orthopedic  No limitations of motion  feet .  No crepitus or effusions noted.  No bony pathology or digital deformities noted.  Plantar flexed met heads 2-4  B/L. Palpable pain left arch.  Skin  normotropic skin noted bilaterally.  No signs of infections or ulcers noted.     Onychomycosis  Pain in right toes  Pain in left toes  Plantar Fasciitis left  Consent was obtained for treatment procedures.   Mechanical debridement of nails 1-5  bilaterally performed with a nail nipper.  Filed with dremel without incident. Padding applied to left shoes.   Return office visit   3 months                   Told patient to return for periodic foot care and evaluation due to potential at risk complications.   Gardiner Barefoot DPM

## 2021-03-26 DIAGNOSIS — Z940 Kidney transplant status: Secondary | ICD-10-CM | POA: Diagnosis not present

## 2021-04-03 DIAGNOSIS — Z86718 Personal history of other venous thrombosis and embolism: Secondary | ICD-10-CM | POA: Diagnosis not present

## 2021-04-03 DIAGNOSIS — Z7901 Long term (current) use of anticoagulants: Secondary | ICD-10-CM | POA: Diagnosis not present

## 2021-04-03 DIAGNOSIS — Z794 Long term (current) use of insulin: Secondary | ICD-10-CM | POA: Diagnosis not present

## 2021-04-03 DIAGNOSIS — E119 Type 2 diabetes mellitus without complications: Secondary | ICD-10-CM | POA: Diagnosis not present

## 2021-04-03 DIAGNOSIS — D84821 Immunodeficiency due to drugs: Secondary | ICD-10-CM | POA: Diagnosis not present

## 2021-04-03 DIAGNOSIS — R600 Localized edema: Secondary | ICD-10-CM | POA: Diagnosis not present

## 2021-04-03 DIAGNOSIS — Z87891 Personal history of nicotine dependence: Secondary | ICD-10-CM | POA: Diagnosis not present

## 2021-04-03 DIAGNOSIS — Z4822 Encounter for aftercare following kidney transplant: Secondary | ICD-10-CM | POA: Diagnosis not present

## 2021-04-03 DIAGNOSIS — E1165 Type 2 diabetes mellitus with hyperglycemia: Secondary | ICD-10-CM | POA: Diagnosis not present

## 2021-04-03 DIAGNOSIS — Z940 Kidney transplant status: Secondary | ICD-10-CM | POA: Diagnosis not present

## 2021-04-03 DIAGNOSIS — Z8661 Personal history of infections of the central nervous system: Secondary | ICD-10-CM | POA: Diagnosis not present

## 2021-04-03 DIAGNOSIS — D849 Immunodeficiency, unspecified: Secondary | ICD-10-CM | POA: Diagnosis not present

## 2021-04-03 DIAGNOSIS — Z7952 Long term (current) use of systemic steroids: Secondary | ICD-10-CM | POA: Diagnosis not present

## 2021-04-03 DIAGNOSIS — M7989 Other specified soft tissue disorders: Secondary | ICD-10-CM | POA: Diagnosis not present

## 2021-04-04 DIAGNOSIS — B349 Viral infection, unspecified: Secondary | ICD-10-CM | POA: Diagnosis not present

## 2021-04-04 DIAGNOSIS — B259 Cytomegaloviral disease, unspecified: Secondary | ICD-10-CM | POA: Diagnosis not present

## 2021-04-04 DIAGNOSIS — Z940 Kidney transplant status: Secondary | ICD-10-CM | POA: Diagnosis not present

## 2021-04-04 DIAGNOSIS — Z5181 Encounter for therapeutic drug level monitoring: Secondary | ICD-10-CM | POA: Diagnosis not present

## 2021-04-07 DIAGNOSIS — D849 Immunodeficiency, unspecified: Secondary | ICD-10-CM | POA: Diagnosis not present

## 2021-04-07 DIAGNOSIS — Z940 Kidney transplant status: Secondary | ICD-10-CM | POA: Diagnosis not present

## 2021-04-07 DIAGNOSIS — Z48298 Encounter for aftercare following other organ transplant: Secondary | ICD-10-CM | POA: Diagnosis not present

## 2021-04-17 DIAGNOSIS — Z794 Long term (current) use of insulin: Secondary | ICD-10-CM | POA: Diagnosis not present

## 2021-04-17 DIAGNOSIS — Z7189 Other specified counseling: Secondary | ICD-10-CM | POA: Diagnosis not present

## 2021-04-17 DIAGNOSIS — E1165 Type 2 diabetes mellitus with hyperglycemia: Secondary | ICD-10-CM | POA: Diagnosis not present

## 2021-04-23 DIAGNOSIS — Z794 Long term (current) use of insulin: Secondary | ICD-10-CM | POA: Diagnosis not present

## 2021-04-23 DIAGNOSIS — E1165 Type 2 diabetes mellitus with hyperglycemia: Secondary | ICD-10-CM | POA: Diagnosis not present

## 2021-04-23 DIAGNOSIS — Z7189 Other specified counseling: Secondary | ICD-10-CM | POA: Diagnosis not present

## 2021-04-28 DIAGNOSIS — B349 Viral infection, unspecified: Secondary | ICD-10-CM | POA: Diagnosis not present

## 2021-04-28 DIAGNOSIS — B259 Cytomegaloviral disease, unspecified: Secondary | ICD-10-CM | POA: Diagnosis not present

## 2021-04-28 DIAGNOSIS — Z940 Kidney transplant status: Secondary | ICD-10-CM | POA: Diagnosis not present

## 2021-04-28 DIAGNOSIS — Z5181 Encounter for therapeutic drug level monitoring: Secondary | ICD-10-CM | POA: Diagnosis not present

## 2021-05-01 DIAGNOSIS — Z940 Kidney transplant status: Secondary | ICD-10-CM | POA: Diagnosis not present

## 2021-05-01 DIAGNOSIS — E119 Type 2 diabetes mellitus without complications: Secondary | ICD-10-CM | POA: Diagnosis not present

## 2021-05-01 DIAGNOSIS — Z23 Encounter for immunization: Secondary | ICD-10-CM | POA: Diagnosis not present

## 2021-05-01 DIAGNOSIS — I10 Essential (primary) hypertension: Secondary | ICD-10-CM | POA: Diagnosis not present

## 2021-05-01 DIAGNOSIS — N1832 Chronic kidney disease, stage 3b: Secondary | ICD-10-CM | POA: Diagnosis not present

## 2021-05-01 DIAGNOSIS — Z79899 Other long term (current) drug therapy: Secondary | ICD-10-CM | POA: Diagnosis not present

## 2021-05-01 DIAGNOSIS — R600 Localized edema: Secondary | ICD-10-CM | POA: Diagnosis not present

## 2021-05-01 DIAGNOSIS — Z87440 Personal history of urinary (tract) infections: Secondary | ICD-10-CM | POA: Diagnosis not present

## 2021-05-01 DIAGNOSIS — Z6820 Body mass index (BMI) 20.0-20.9, adult: Secondary | ICD-10-CM | POA: Diagnosis not present

## 2021-05-01 DIAGNOSIS — B457 Disseminated cryptococcosis: Secondary | ICD-10-CM | POA: Diagnosis not present

## 2021-05-01 DIAGNOSIS — D849 Immunodeficiency, unspecified: Secondary | ICD-10-CM | POA: Diagnosis not present

## 2021-05-02 DIAGNOSIS — M7989 Other specified soft tissue disorders: Secondary | ICD-10-CM | POA: Diagnosis not present

## 2021-05-02 DIAGNOSIS — D649 Anemia, unspecified: Secondary | ICD-10-CM | POA: Diagnosis not present

## 2021-05-02 DIAGNOSIS — I872 Venous insufficiency (chronic) (peripheral): Secondary | ICD-10-CM | POA: Diagnosis not present

## 2021-05-02 DIAGNOSIS — Z86718 Personal history of other venous thrombosis and embolism: Secondary | ICD-10-CM | POA: Diagnosis not present

## 2021-05-05 DIAGNOSIS — Z5181 Encounter for therapeutic drug level monitoring: Secondary | ICD-10-CM | POA: Diagnosis not present

## 2021-05-05 DIAGNOSIS — Z940 Kidney transplant status: Secondary | ICD-10-CM | POA: Diagnosis not present

## 2021-05-07 DIAGNOSIS — E1165 Type 2 diabetes mellitus with hyperglycemia: Secondary | ICD-10-CM | POA: Diagnosis not present

## 2021-05-07 DIAGNOSIS — Z794 Long term (current) use of insulin: Secondary | ICD-10-CM | POA: Diagnosis not present

## 2021-05-07 DIAGNOSIS — Z23 Encounter for immunization: Secondary | ICD-10-CM | POA: Diagnosis not present

## 2021-05-07 DIAGNOSIS — Z7189 Other specified counseling: Secondary | ICD-10-CM | POA: Diagnosis not present

## 2021-05-23 ENCOUNTER — Emergency Department (HOSPITAL_BASED_OUTPATIENT_CLINIC_OR_DEPARTMENT_OTHER)
Admission: EM | Admit: 2021-05-23 | Discharge: 2021-05-23 | Disposition: A | Discharge status: Home / Self Care | Payer: Medicare Other | Attending: Emergency Medicine | Admitting: Emergency Medicine

## 2021-05-23 ENCOUNTER — Other Ambulatory Visit: Payer: Self-pay

## 2021-05-23 ENCOUNTER — Emergency Department (HOSPITAL_BASED_OUTPATIENT_CLINIC_OR_DEPARTMENT_OTHER): Payer: Medicare Other

## 2021-05-23 ENCOUNTER — Encounter (HOSPITAL_BASED_OUTPATIENT_CLINIC_OR_DEPARTMENT_OTHER): Payer: Self-pay | Admitting: *Deleted

## 2021-05-23 DIAGNOSIS — R531 Weakness: Secondary | ICD-10-CM | POA: Diagnosis not present

## 2021-05-23 DIAGNOSIS — Z940 Kidney transplant status: Secondary | ICD-10-CM | POA: Diagnosis not present

## 2021-05-23 DIAGNOSIS — E1165 Type 2 diabetes mellitus with hyperglycemia: Secondary | ICD-10-CM | POA: Diagnosis not present

## 2021-05-23 DIAGNOSIS — E162 Hypoglycemia, unspecified: Secondary | ICD-10-CM

## 2021-05-23 DIAGNOSIS — R059 Cough, unspecified: Secondary | ICD-10-CM | POA: Diagnosis not present

## 2021-05-23 DIAGNOSIS — Z7901 Long term (current) use of anticoagulants: Secondary | ICD-10-CM | POA: Diagnosis not present

## 2021-05-23 DIAGNOSIS — Z9483 Pancreas transplant status: Secondary | ICD-10-CM | POA: Diagnosis not present

## 2021-05-23 DIAGNOSIS — E11649 Type 2 diabetes mellitus with hypoglycemia without coma: Secondary | ICD-10-CM | POA: Diagnosis not present

## 2021-05-23 DIAGNOSIS — Z794 Long term (current) use of insulin: Secondary | ICD-10-CM | POA: Insufficient documentation

## 2021-05-23 DIAGNOSIS — U071 COVID-19: Secondary | ICD-10-CM | POA: Diagnosis not present

## 2021-05-23 DIAGNOSIS — Z5181 Encounter for therapeutic drug level monitoring: Secondary | ICD-10-CM | POA: Diagnosis not present

## 2021-05-23 LAB — COMPREHENSIVE METABOLIC PANEL
ALT: 12 U/L (ref 0–44)
AST: 16 U/L (ref 15–41)
Albumin: 4 g/dL (ref 3.5–5.0)
Alkaline Phosphatase: 46 U/L (ref 38–126)
Anion gap: 7 (ref 5–15)
BUN: 45 mg/dL — ABNORMAL HIGH (ref 8–23)
CO2: 25 mmol/L (ref 22–32)
Calcium: 9.8 mg/dL (ref 8.9–10.3)
Chloride: 101 mmol/L (ref 98–111)
Creatinine, Ser: 2.47 mg/dL — ABNORMAL HIGH (ref 0.61–1.24)
GFR, Estimated: 27 mL/min — ABNORMAL LOW (ref >60)
Glucose, Bld: 67 mg/dL — ABNORMAL LOW (ref 70–99)
Potassium: 4.6 mmol/L (ref 3.5–5.1)
Sodium: 133 mmol/L — ABNORMAL LOW (ref 135–145)
Total Bilirubin: 0.3 mg/dL (ref 0.3–1.2)
Total Protein: 7.2 g/dL (ref 6.5–8.1)

## 2021-05-23 LAB — URINALYSIS, ROUTINE W REFLEX MICROSCOPIC
Bilirubin Urine: NEGATIVE
Glucose, UA: NEGATIVE mg/dL
Hgb urine dipstick: NEGATIVE
Ketones, ur: NEGATIVE mg/dL
Leukocytes,Ua: NEGATIVE
Nitrite: NEGATIVE
Protein, ur: NEGATIVE mg/dL
Specific Gravity, Urine: 1.009 (ref 1.005–1.030)
pH: 5 (ref 5.0–8.0)

## 2021-05-23 LAB — CBG MONITORING, ED
Glucose-Capillary: 59 mg/dL — ABNORMAL LOW (ref 70–99)
Glucose-Capillary: 84 mg/dL (ref 70–99)

## 2021-05-23 LAB — CBC
HCT: 36.4 % — ABNORMAL LOW (ref 39.0–52.0)
Hemoglobin: 11.5 g/dL — ABNORMAL LOW (ref 13.0–17.0)
MCH: 28.5 pg (ref 26.0–34.0)
MCHC: 31.6 g/dL (ref 30.0–36.0)
MCV: 90.1 fL (ref 80.0–100.0)
Platelets: 216 10*3/uL (ref 150–400)
RBC: 4.04 MIL/uL — ABNORMAL LOW (ref 4.22–5.81)
RDW: 13.6 % (ref 11.5–15.5)
WBC: 7.4 10*3/uL (ref 4.0–10.5)
nRBC: 0 % (ref 0.0–0.2)

## 2021-05-23 NOTE — ED Notes (Signed)
Patient given 12 oz of juice with peanut butter crackers due to low sugar. Patient is alert and oriented and able to eat and drink without difficulty.

## 2021-05-23 NOTE — ED Notes (Signed)
RN provided AVS using Teachback Method. Patient verbalizes understanding of Discharge Instructions. Opportunity for Questioning and Answers were provided by RN. Patient Discharged from ED in wheelchair to Home with Family.

## 2021-05-23 NOTE — ED Notes (Signed)
XRAY at Bedside.

## 2021-05-23 NOTE — ED Provider Notes (Signed)
Patient was seen by Dr. Alvino Chapel initially.  Please see his note.  Patient was noted to have hypoglycemia and generalized weakness.  ED work-up otherwise reassuring.  He has stable renal sufficiency.  Urinalysis was pending and he does not show signs of infection.  No obvious cause of his symptoms and its possible some of this may be related to his COVID infection that he is recovering from.  He has been able to eat and drink.  Sugars stable.  He will monitor his blood sugars closely.  At this time he appears stable for discharge and outpatient follow-up with his transplant team.  Patient is comfortable this plan and ready to go home.   Dorie Rank, MD 05/23/21 [phone]

## 2021-05-23 NOTE — ED Provider Notes (Signed)
[facility] EMERGENCY DEPT Provider Note   CSN: [id number] Arrival date & time: 05/23/21  1242     History  Chief Complaint  Patient presents with   Cough   Hypoglycemia    [name] [name] is a 71 y.o. male.   Cough Associated symptoms: no rash   Patient presents feeling bad.  Around Christmas patient got COVID.  Reportedly symptoms began on around the 27th.  Had had a cough.  Still feeling little bad.  Sugars have been going both high and low.  States before going to the Christmas dinner sugars went down to the 50s.  Has been high as 400 after that and sometimes going down.  Occasional cough.  Just feeling fatigued overall.  Has a history of a renal transplant.  Is on immunosuppression for that.  Is also diabetic.    Home Medications Prior to Admission medications   Medication Sig Start Date End Date Taking? Authorizing Provider  acetaminophen (TYLENOL) 325 MG tablet Take 2 tablets (650 mg total) by mouth every 6 (six) hours as needed for mild pain (or Fever >/= 101). 07/07/20   [name], MD  acyclovir (ZOVIRAX) 200 MG capsule Take 400 mg by mouth 2 (two) times daily. 09/12/20   [provider]  amLODipine (NORVASC) 5 MG tablet Take 1 tablet (5 mg total) by mouth daily. 07/07/20   [name], MD  APIXABAN Arne [place]) VTE STARTER PACK (10MG  AND 5MG ) Take as directed on package: start with two-5mg  tablets twice daily for 7 days. On day 8, switch to one-5mg  tablet twice daily. 09/28/20   Patrecia Pour, MD  atovaquone (MEPRON) 750 MG/5ML suspension Take 1,500 mg by mouth every morning. 09/12/20   [provider]  carvedilol (COREG) 6.25 MG tablet Take 6.25 mg by mouth 2 (two) times daily. 09/11/20   [provider]  Cholecalciferol (VITAMIN D3) 50 MCG (2000 UT) CAPS Take 2,000 Units by mouth daily.    [provider]  [facility] SENNA PLUS 8.6-50 MG tablet Take 2 tablets by mouth daily. 09/12/20   [provider]  famotidine  (PEPCID) 20 MG tablet Take 20 mg by mouth 2 (two) times daily. 09/12/20   [provider]  fluconazole (DIFLUCAN) 200 MG tablet Take 400 mg by mouth daily. 09/23/20   [provider]  folic acid (FOLVITE) 1 MG tablet Take 1 mg by mouth daily. 09/12/20   [provider]  insulin aspart (NOVOLOG) 100 UNIT/ML injection Inject 0-15 Units into the skin 4 (four) times daily - after meals and at bedtime. Patient taking differently: Inject 5 Units into the skin 4 (four) times daily - after meals and at bedtime. 07/07/20   [name], MD  lidocaine (LIDODERM) 5 % Place 1 patch onto the skin See admin instructions. Remove & Discard patch within 12 hours or as directed by MD    [provider]  Melatonin 3 MG TBDP Take 6 mg by mouth at bedtime. 09/13/20   [provider]  oxybutynin (DITROPAN-XL) 5 MG 24 hr tablet Take 5 mg by mouth daily. 06/02/20   [provider]  predniSONE (DELTASONE) 5 MG tablet Take 10 mg by mouth daily with breakfast.    [provider]  rosuvastatin (CRESTOR) 20 MG tablet TAKE 1 TABLET BY MOUTH EVERY DAY 01/21/21   [name], [name], MD  tacrolimus (PROGRAF) 1 MG capsule Take 1 mg by mouth 2 (two) times daily. 10/31/19   [provider]  thiamine 100 MG tablet Take 100 mg by mouth daily. 09/11/20   [provider]      Allergies    Other, No known allergies, and Bag balm  [albolene]    Review of Systems   Review of Systems  Constitutional:  Positive for appetite change.  Respiratory:  Positive for cough.   Cardiovascular:  Positive for leg swelling.  Gastrointestinal:  Negative for abdominal pain.  Genitourinary:  Negative for flank pain.  Skin:  Negative for rash.  Psychiatric/Behavioral:  Positive for confusion.    Physical Exam Updated Vital Signs BP 124/65 (BP Location: Right Arm)    Pulse 71    Temp 98 F (36.7 C) (Oral)    Resp 14    Ht 5' 9.5" (1.765 m)    Wt 59.4 kg    SpO2 100%     BMI 19.07 kg/m  Physical Exam Vitals and nursing note reviewed.  HENT:     Head: Atraumatic.  Cardiovascular:     Rate and Rhythm: Regular rhythm.  Pulmonary:     Breath sounds: No wheezing.  Abdominal:     Tenderness: There is no abdominal tenderness.  Musculoskeletal:     Cervical back: Neck supple.     Right lower leg: Edema present.     Left lower leg: Edema present.     Comments: Pitting edema bilateral lower extremities.  Neurological:     Mental Status: He is alert and oriented to person, place, and time.    ED Results / Procedures / Treatments   Labs (all labs ordered are listed, but only abnormal results are displayed) Labs Reviewed  CBC - Abnormal; Notable for the following components:      Result Value   RBC 4.04 (*)    Hemoglobin 11.5 (*)    HCT 36.4 (*)    All other components within normal limits  COMPREHENSIVE METABOLIC PANEL - Abnormal; Notable for the following components:   Sodium 133 (*)    Glucose, Bld 67 (*)    BUN 45 (*)    Creatinine, Ser 2.47 (*)    GFR, Estimated 27 (*)    All other components within normal limits  CBG MONITORING, ED - Abnormal; Notable for the following components:   Glucose-Capillary 59 (*)    All other components within normal limits  TACROLIMUS LEVEL  URINALYSIS, ROUTINE W REFLEX MICROSCOPIC  CBG MONITORING, ED  CBG MONITORING, ED    EKG None  Radiology DG Chest Portable 1 View  Result Date: 05/23/2021 CLINICAL DATA:  Weakness.  Cough.  COVID positive last week. EXAM: PORTABLE CHEST 1 VIEW COMPARISON:  Radiograph 09/25/2020 FINDINGS: The cardiomediastinal contours are normal. The lungs are clear. Pulmonary vasculature is normal. No consolidation, pleural effusion, or pneumothorax. No acute osseous abnormalities are seen. IMPRESSION: No acute chest findings. Electronically Signed   By: Keith Rake M.D.   On: 05/23/2021 15:22    Procedures Procedures    Medications Ordered in ED Medications - No data to  display  ED Course/ Medical Decision Making/ A&P                           Medical Decision Making Patient presents with generalized weakness.  Got COVID after Christmas.  Reportedly symptoms started on the 27th and tested positive.  Does have a history of a kidney transplant.  Has had issues with his sugars going high and low.  It was low here down to  the 59.  Oral intake is improved it.  Recheck to 81.  Creatinine is elevated 2.47 interpreted by me, however on November 7 last year it was 3.5 and on September 15 it was 2.6 reviewing records from [place].  Hemoglobin is 11.5 and appears to be at baseline.  We will continue to monitor sugar.  Chest x-ray resulted and reassuring.  Care will be turned over to Dr. Tomi Bamberger.  Amount and/or Complexity of Data Reviewed External Data Reviewed: labs and notes. Labs: ordered. Decision-making details documented in ED Course. Radiology: ordered and independent interpretation performed. Decision-making details documented in ED Course.           Final Clinical Impression(s) / ED Diagnoses Final diagnoses:  Weakness  Hypoglycemia  Kidney transplant recipient    Rx / DC Orders ED Discharge Orders     None         Davonna Belling, MD 05/23/21 1526

## 2021-05-23 NOTE — Discharge Instructions (Signed)
Continue current medications.  Follow-up with your planned strength doctors to be rechecked.  Monitor for fevers chills worsening symptoms

## 2021-05-23 NOTE — ED Triage Notes (Signed)
Covid last week, went to [employer] [facility] this morning tor labs, Daughter told him to come to the ED after talking to someone on the phone. Pt unsure as to why he is here. Wife states he still has a cough, runny nose, just wants him checked out.

## 2021-05-23 NOTE — ED Notes (Signed)
This RN attempted to insert PIV and obtain Lab specimens twice with no success. Magda Paganini, RN to attempt.

## 2021-06-03 ENCOUNTER — Ambulatory Visit (INDEPENDENT_AMBULATORY_CARE_PROVIDER_SITE_OTHER): Payer: Medicare Other | Admitting: Internal Medicine

## 2021-06-03 VITALS — BP 112/70 | HR 62 | Temp 98.2°F | Resp 16 | Ht 68.0 in | Wt 130.0 lb

## 2021-06-03 DIAGNOSIS — Z0000 Encounter for general adult medical examination without abnormal findings: Secondary | ICD-10-CM | POA: Diagnosis not present

## 2021-06-03 DIAGNOSIS — D849 Immunodeficiency, unspecified: Secondary | ICD-10-CM | POA: Diagnosis not present

## 2021-06-03 DIAGNOSIS — Z940 Kidney transplant status: Secondary | ICD-10-CM

## 2021-06-03 DIAGNOSIS — E1140 Type 2 diabetes mellitus with diabetic neuropathy, unspecified: Secondary | ICD-10-CM | POA: Diagnosis not present

## 2021-06-03 LAB — LIPID PANEL
Cholesterol: 165 mg/dL (ref 0–200)
HDL: 48.4 mg/dL (ref >39.00)
NonHDL: 116.58
Total CHOL/HDL Ratio: 3
Triglycerides: 206 mg/dL — ABNORMAL HIGH (ref 0.0–149.0)
VLDL: 41.2 mg/dL — ABNORMAL HIGH (ref 0.0–40.0)

## 2021-06-03 LAB — LDL CHOLESTEROL, DIRECT: Direct LDL: 68 mg/dL

## 2021-06-03 NOTE — Progress Notes (Signed)
Established Patient Office Visit     This visit occurred during the SARS-CoV-2 public health emergency.  Safety protocols were in place, including screening questions prior to the visit, additional usage of staff PPE, and extensive cleaning of exam room while observing appropriate contact time as indicated for disinfecting solutions.    CC/Reason for Visit: Annual preventive exam and subsequent Medicare wellness visit  HPI: [name] [name] is a 71 y.o. male who is coming in today for the above mentioned reasons. Past Medical History is significant for: Hypertension, hyperlipidemia, insulin-dependent diabetes.  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  He has been following with his transplant team at [facility], in fact all of his medical care is at [facility] with the exception of me.  He has been feeling really well and has no acute concerns today.  He has routine eye care and tells me that they are planning some type of eye surgery as his vision is diminished.  He has not seen a dentist in years, no perceived hearing issues.  He has completed his COVID series, he had his flu vaccine in September, he believes he had 1 shingles vaccine at [facility] although I do not have documentation of this.  Colonoscopy 2021.  He has a prior history of prostate cancer, his urologist is also at [facility].   Past Medical/Surgical History: Past Medical History:  Diagnosis Date   Anemia    as a younger man   Arthritis    hands   Cataract    Chronic kidney disease    "starting to bother me now" (07/28/2012)   Diabetes mellitus     type II   Diarrhea, functional    ED (erectile dysfunction)    Hypertension    Neuropathy    Peripheral neuropathy     Past Surgical History:  Procedure Laterality Date   APPENDECTOMY  1950's   AV FISTULA PLACEMENT Right 05/14/2016   Procedure: ARTERIOVENOUS (AV) FISTULA CREATION-  RIGHT ARM;  Surgeon: Serafina Mitchell, MD;  Location: [place];  Service: Vascular;  Laterality: Right;   COLONOSCOPY     KIDNEY TRANSPLANT Right    PROSTATE SURGERY     UPPER GASTROINTESTINAL ENDOSCOPY      Social History:  reports that he quit smoking about 35 years ago. His smoking use included cigarettes. He has a 1.20 pack-year smoking history. He has never used smokeless tobacco. He reports that he does not currently use alcohol after a past usage of about 1.0 standard drink per week. He reports that he does not use drugs.  Allergies: Allergies  Allergen Reactions   Other Rash    Oxyquinoline-white Pet-lanolin   No Known Allergies    Bag Balm  [Albolene] Rash    Family History:  Family History  Problem Relation Age of Onset   Colon polyps Mother    Stroke Mother    Diabetes Father    Diabetes Cousin    Prostate cancer Brother    Other Daughter        kidney problems   Esophageal cancer Maternal Aunt    Colon cancer Neg Hx    Stomach cancer Neg Hx    Rectal cancer Neg Hx      Current Outpatient Medications:    acetaminophen (TYLENOL) 325 MG tablet, Take 2 tablets (650 mg total) by mouth every 6 (six) hours as needed for mild pain (or Fever >/=  101)., Disp: 30 tablet, Rfl: 0   acyclovir (ZOVIRAX) 200 MG capsule, Take 400 mg by mouth 2 (two) times daily., Disp: , Rfl:    amLODipine (NORVASC) 5 MG tablet, Take 1 tablet (5 mg total) by mouth daily., Disp: 30 tablet, Rfl: 0   APIXABAN (ELIQUIS) VTE STARTER PACK (10MG  AND 5MG ), Take as directed on package: start with two-5mg  tablets twice daily for 7 days. On day 8, switch to one-5mg  tablet twice daily., Disp: 1 each, Rfl: 0   atovaquone (MEPRON) 750 MG/5ML suspension, Take 1,500 mg by mouth every morning., Disp: , Rfl:    carvedilol (COREG) 6.25 MG tablet, Take 6.25 mg by mouth 2 (two) times daily., Disp: , Rfl:    Cholecalciferol (VITAMIN D3) 50 MCG (2000 UT) CAPS, Take 2,000 Units by mouth daily., Disp: , Rfl:    [employer] SENNA  PLUS 8.6-50 MG tablet, Take 2 tablets by mouth daily., Disp: , Rfl:    famotidine (PEPCID) 20 MG tablet, Take 20 mg by mouth 2 (two) times daily., Disp: , Rfl:    fluconazole (DIFLUCAN) 200 MG tablet, Take 400 mg by mouth daily., Disp: , Rfl:    folic acid (FOLVITE) 1 MG tablet, Take 1 mg by mouth daily., Disp: , Rfl:    insulin aspart (NOVOLOG) 100 UNIT/ML injection, Inject 0-15 Units into the skin 4 (four) times daily - after meals and at bedtime. (Patient taking differently: Inject 5 Units into the skin 4 (four) times daily - after meals and at bedtime.), Disp: 10 mL, Rfl: 0   lidocaine (LIDODERM) 5 %, Place 1 patch onto the skin See admin instructions. Remove & Discard patch within 12 hours or as directed by MD, Disp: , Rfl:    Melatonin 3 MG TBDP, Take 6 mg by mouth at bedtime., Disp: , Rfl:    oxybutynin (DITROPAN-XL) 5 MG 24 hr tablet, Take 5 mg by mouth daily., Disp: , Rfl:    predniSONE (DELTASONE) 5 MG tablet, Take 10 mg by mouth daily with breakfast., Disp: , Rfl:    rosuvastatin (CRESTOR) 20 MG tablet, TAKE 1 TABLET BY MOUTH EVERY DAY, Disp: 90 tablet, Rfl: 1   tacrolimus (PROGRAF) 1 MG capsule, Take 1 mg by mouth 2 (two) times daily., Disp: , Rfl:    thiamine 100 MG tablet, Take 100 mg by mouth daily., Disp: , Rfl:   Review of Systems:  Constitutional: Denies fever, chills, diaphoresis, appetite change and fatigue.  HEENT: Denies photophobia, eye pain, redness, hearing loss, ear pain, congestion, sore throat, rhinorrhea, sneezing, mouth sores, trouble swallowing, neck pain, neck stiffness and tinnitus.   Respiratory: Denies SOB, DOE, cough, chest tightness,  and wheezing.   Cardiovascular: Denies chest pain, palpitations and leg swelling.  Gastrointestinal: Denies nausea, vomiting, abdominal pain, diarrhea, constipation, blood in stool and abdominal distention.  Genitourinary: Denies dysuria, urgency, frequency, hematuria, flank pain and difficulty urinating.  Endocrine: Denies: hot  or cold intolerance, sweats, changes in hair or nails, polyuria, polydipsia. Musculoskeletal: Denies myalgias, back pain, joint swelling, arthralgias and gait problem.  Skin: Denies pallor, rash and wound.  Neurological: Denies dizziness, seizures, syncope, weakness, light-headedness, numbness and headaches.  Hematological: Denies adenopathy. Easy bruising, personal or family bleeding history  Psychiatric/Behavioral: Denies suicidal ideation, mood changes, confusion, nervousness, sleep disturbance and agitation    Physical Exam: Vitals:   06/03/21 1025  BP: 112/70  Pulse: 62  Resp: 16  Temp: 98.2 F (36.8 C)  SpO2: 97%  Weight: 130 lb (59 kg)  Height:  5\' 8"  (1.727 m)    Body mass index is 19.77 kg/m.   Constitutional: NAD, calm, comfortable Eyes: PERRL, lids and conjunctivae normal ENMT: Mucous membranes are moist. Posterior pharynx clear of any exudate or lesions.  Poor dentition. Tympanic membrane is pearly white, no erythema or bulging. Neck: normal, supple, no masses, no thyromegaly Respiratory: clear to auscultation bilaterally, no wheezing, no crackles. Normal respiratory effort. No accessory muscle use.  Cardiovascular: Regular rate and rhythm, no murmurs / rubs / gallops. No extremity edema. 2+ pedal pulses. No carotid bruits.  Abdomen: no tenderness, no masses palpated. No hepatosplenomegaly. Bowel sounds positive.  Musculoskeletal: no clubbing / cyanosis. No joint deformity upper and lower extremities. Good ROM, no contractures. Normal muscle tone.  Skin: no rashes, lesions, ulcers. No induration Neurologic: CN 2-12 grossly intact. Sensation intact, DTR normal. Strength 5/5 in all 4.  Psychiatric: Normal judgment and insight. Alert and oriented x 3. Normal mood.    Subsequent Medicare wellness visit   1. Risk factors, based on past  M,S,F -cardiovascular disease risk factors include age, gender, history of hypertension, hyperlipidemia, diabetes   2.  Physical  activities: Active with activities of daily living only   3.  Depression/mood: Stable, not depressed   4.  Hearing: No perceived issues   5.  ADL's: Independent in all ADLs   6.  Fall risk: Low fall risk   7.  Home safety: No problems identified   8.  Height weight, and visual acuity: height and weight as above, vision:  Vision Screening   Right eye Left eye Both eyes  Without correction 20/80 20/60 20/60   With correction        9.  Counseling: Advised he update his vaccination status   10. Lab orders based on risk factors: Laboratory update will be reviewed   11. Referral : None today   12. Care plan: Follow-up with me in 6 months   13. Cognitive assessment: No cognitive impairment   14. Screening: Patient provided with a written and personalized 5-10 year screening schedule in the AVS. yes   15. Provider List Update: PCP, multiple specialists including his transplant team at [facility]  16. Advance Directives: Full code   17. Opioids: Patient is not on any opioid prescriptions and has no risk factors for a substance use disorder.   [place] Office Visit from 10/01/2020 in [place] at [place]  PHQ-9 Total Score 2       Fall Risk 09/27/2020 09/28/2020 09/28/2020 10/01/2020 05/23/2021  Falls in the past year? - - - 1 -  Was there an injury with Fall? - - - 1 -  Fall Risk Category Calculator - - - 3 -  Fall Risk Category - - - High -  Patient Fall Risk Level High fall risk High fall risk High fall risk - Low fall risk     Impression and Plan:  Encounter for preventive health examination -Recommend routine eye and dental care. -Immunizations: All immunizations are up-to-date with the exception of shingles, he believes he has had at least 1 shingles vaccine, he will try and obtain documentation for this -Healthy lifestyle discussed in detail. -Labs to be updated today. -Colon cancer screening: 07/2019 -Breast cancer screening: Not applicable -Cervical  cancer screening: Not applicable -Lung cancer screening: Not applicable -Prostate cancer screening: He is status post TURP for prostate cancer -DEXA: Not applicable  Kidney transplanted  Immunosuppressed status ([place])  Type 2 diabetes mellitus with diabetic neuropathy, without long-term current use  of insulin ([place])  - Plan: Lipid panel -A1c followed at [facility], last measurement that I see is 7.2 in September.    Patient Instructions  -Nice seeing you today!!  -Lab work today; will notify you once results are available.  -Schedule follow up in 6 months or sooner as needed.      Lelon Frohlich, MD [facility]

## 2021-06-03 NOTE — Patient Instructions (Signed)
-  Nice seeing you today!!  -Lab work today; will notify you once results are available.  -Schedule follow up in 6 months or sooner as needed.   

## 2021-06-09 ENCOUNTER — Ambulatory Visit (INDEPENDENT_AMBULATORY_CARE_PROVIDER_SITE_OTHER): Payer: Medicare Other | Admitting: Podiatry

## 2021-06-09 ENCOUNTER — Other Ambulatory Visit: Payer: Self-pay

## 2021-06-09 ENCOUNTER — Encounter: Payer: Self-pay | Admitting: Podiatry

## 2021-06-09 DIAGNOSIS — D689 Coagulation defect, unspecified: Secondary | ICD-10-CM | POA: Diagnosis not present

## 2021-06-09 DIAGNOSIS — M79675 Pain in left toe(s): Secondary | ICD-10-CM

## 2021-06-09 DIAGNOSIS — R972 Elevated prostate specific antigen [PSA]: Secondary | ICD-10-CM

## 2021-06-09 DIAGNOSIS — N185 Chronic kidney disease, stage 5: Secondary | ICD-10-CM

## 2021-06-09 DIAGNOSIS — M79674 Pain in right toe(s): Secondary | ICD-10-CM | POA: Diagnosis not present

## 2021-06-09 DIAGNOSIS — B351 Tinea unguium: Secondary | ICD-10-CM

## 2021-06-09 DIAGNOSIS — Z992 Dependence on renal dialysis: Secondary | ICD-10-CM | POA: Diagnosis not present

## 2021-06-09 DIAGNOSIS — N186 End stage renal disease: Secondary | ICD-10-CM

## 2021-06-09 DIAGNOSIS — E1142 Type 2 diabetes mellitus with diabetic polyneuropathy: Secondary | ICD-10-CM | POA: Diagnosis not present

## 2021-06-09 NOTE — Progress Notes (Signed)
This patient returns to my office for at risk foot care.  This patient requires this care by a professional since this patient will be at risk due to having ESRD and diabetes. Patient has not been seen in over 6 months.  This patient is unable to cut nails himself since the patient cannot reach his nails.These nails are painful walking and wearing shoes.  This patient presents for at risk foot care today.   General Appearance  Alert, conversant and in no acute stress.  Vascular  Dorsalis pedis and posterior tibial  pulses are  weakly palpable  bilaterally.  Capillary return is within normal limits  bilaterally. Cold feet   bilaterally.  Neurologic  Senn-Weinstein monofilament wire test within normal limits  bilaterally. Muscle power within normal limits bilaterally.  Nails Thick disfigured discolored nails with subungual debris  from hallux to fifth toes bilaterally. No evidence of bacterial infection or drainage bilaterally.  Orthopedic  No limitations of motion  feet .  No crepitus or effusions noted.  No bony pathology or digital deformities noted.  Plantar flexed met heads 2-4  B/L.   Skin  normotropic skin noted bilaterally.  No signs of infections or ulcers noted.     Onychomycosis  Pain in right toes  Pain in left toes    Consent was obtained for treatment procedures.   Mechanical debridement of nails 1-5  bilaterally performed with a nail nipper.  Filed with dremel without incident.    Return office visit   3 months                   Told patient to return for periodic foot care and evaluation due to potential at risk complications.   Gardiner Barefoot DPM

## 2021-06-10 DIAGNOSIS — Z9483 Pancreas transplant status: Secondary | ICD-10-CM | POA: Diagnosis not present

## 2021-06-10 DIAGNOSIS — Z5181 Encounter for therapeutic drug level monitoring: Secondary | ICD-10-CM | POA: Diagnosis not present

## 2021-06-10 DIAGNOSIS — Z940 Kidney transplant status: Secondary | ICD-10-CM | POA: Diagnosis not present

## 2021-07-08 DIAGNOSIS — Z87891 Personal history of nicotine dependence: Secondary | ICD-10-CM | POA: Diagnosis not present

## 2021-07-08 DIAGNOSIS — Z794 Long term (current) use of insulin: Secondary | ICD-10-CM | POA: Diagnosis not present

## 2021-07-08 DIAGNOSIS — D84821 Immunodeficiency due to drugs: Secondary | ICD-10-CM | POA: Diagnosis not present

## 2021-07-08 DIAGNOSIS — Z79899 Other long term (current) drug therapy: Secondary | ICD-10-CM | POA: Diagnosis not present

## 2021-07-08 DIAGNOSIS — Z7952 Long term (current) use of systemic steroids: Secondary | ICD-10-CM | POA: Diagnosis not present

## 2021-07-08 DIAGNOSIS — I2510 Atherosclerotic heart disease of native coronary artery without angina pectoris: Secondary | ICD-10-CM | POA: Diagnosis not present

## 2021-07-08 DIAGNOSIS — E1165 Type 2 diabetes mellitus with hyperglycemia: Secondary | ICD-10-CM | POA: Diagnosis not present

## 2021-07-08 DIAGNOSIS — Z8673 Personal history of transient ischemic attack (TIA), and cerebral infarction without residual deficits: Secondary | ICD-10-CM | POA: Diagnosis not present

## 2021-07-08 DIAGNOSIS — Z905 Acquired absence of kidney: Secondary | ICD-10-CM | POA: Diagnosis not present

## 2021-07-08 DIAGNOSIS — Z7901 Long term (current) use of anticoagulants: Secondary | ICD-10-CM | POA: Diagnosis not present

## 2021-07-23 DIAGNOSIS — Z940 Kidney transplant status: Secondary | ICD-10-CM | POA: Diagnosis not present

## 2021-08-07 DIAGNOSIS — Z9483 Pancreas transplant status: Secondary | ICD-10-CM | POA: Diagnosis not present

## 2021-08-07 DIAGNOSIS — Z5181 Encounter for therapeutic drug level monitoring: Secondary | ICD-10-CM | POA: Diagnosis not present

## 2021-08-07 DIAGNOSIS — Z940 Kidney transplant status: Secondary | ICD-10-CM | POA: Diagnosis not present

## 2021-08-14 DIAGNOSIS — Z9483 Pancreas transplant status: Secondary | ICD-10-CM | POA: Diagnosis not present

## 2021-08-14 DIAGNOSIS — Z5181 Encounter for therapeutic drug level monitoring: Secondary | ICD-10-CM | POA: Diagnosis not present

## 2021-08-14 DIAGNOSIS — N390 Urinary tract infection, site not specified: Secondary | ICD-10-CM | POA: Diagnosis not present

## 2021-08-14 DIAGNOSIS — Z940 Kidney transplant status: Secondary | ICD-10-CM | POA: Diagnosis not present

## 2021-08-16 ENCOUNTER — Other Ambulatory Visit: Payer: Self-pay | Admitting: Internal Medicine

## 2021-08-20 DIAGNOSIS — N179 Acute kidney failure, unspecified: Secondary | ICD-10-CM | POA: Diagnosis not present

## 2021-08-20 DIAGNOSIS — I87009 Postthrombotic syndrome without complications of unspecified extremity: Secondary | ICD-10-CM | POA: Diagnosis not present

## 2021-08-20 DIAGNOSIS — I82432 Acute embolism and thrombosis of left popliteal vein: Secondary | ICD-10-CM | POA: Diagnosis not present

## 2021-08-20 DIAGNOSIS — Z7901 Long term (current) use of anticoagulants: Secondary | ICD-10-CM | POA: Diagnosis not present

## 2021-08-20 DIAGNOSIS — I878 Other specified disorders of veins: Secondary | ICD-10-CM | POA: Diagnosis not present

## 2021-08-20 DIAGNOSIS — Z8673 Personal history of transient ischemic attack (TIA), and cerebral infarction without residual deficits: Secondary | ICD-10-CM | POA: Diagnosis not present

## 2021-08-20 DIAGNOSIS — I770 Arteriovenous fistula, acquired: Secondary | ICD-10-CM | POA: Diagnosis not present

## 2021-08-20 DIAGNOSIS — Z940 Kidney transplant status: Secondary | ICD-10-CM | POA: Diagnosis not present

## 2021-09-08 ENCOUNTER — Other Ambulatory Visit: Payer: Self-pay

## 2021-09-08 ENCOUNTER — Encounter (HOSPITAL_COMMUNITY): Payer: Self-pay

## 2021-09-08 ENCOUNTER — Inpatient Hospital Stay (HOSPITAL_COMMUNITY)
Admission: EM | Admit: 2021-09-08 | Discharge: 2021-09-11 | DRG: 603 | Disposition: A | Discharge status: Home / Self Care | Payer: Medicare Other | Attending: Internal Medicine | Admitting: Internal Medicine

## 2021-09-08 DIAGNOSIS — N189 Chronic kidney disease, unspecified: Secondary | ICD-10-CM | POA: Diagnosis present

## 2021-09-08 DIAGNOSIS — Z91048 Other nonmedicinal substance allergy status: Secondary | ICD-10-CM

## 2021-09-08 DIAGNOSIS — E1140 Type 2 diabetes mellitus with diabetic neuropathy, unspecified: Secondary | ICD-10-CM | POA: Diagnosis present

## 2021-09-08 DIAGNOSIS — Z9109 Other allergy status, other than to drugs and biological substances: Secondary | ICD-10-CM

## 2021-09-08 DIAGNOSIS — L0390 Cellulitis, unspecified: Secondary | ICD-10-CM | POA: Diagnosis present

## 2021-09-08 DIAGNOSIS — E1122 Type 2 diabetes mellitus with diabetic chronic kidney disease: Secondary | ICD-10-CM | POA: Diagnosis present

## 2021-09-08 DIAGNOSIS — I10 Essential (primary) hypertension: Secondary | ICD-10-CM | POA: Diagnosis not present

## 2021-09-08 DIAGNOSIS — Z7901 Long term (current) use of anticoagulants: Secondary | ICD-10-CM | POA: Diagnosis not present

## 2021-09-08 DIAGNOSIS — Z940 Kidney transplant status: Secondary | ICD-10-CM | POA: Diagnosis not present

## 2021-09-08 DIAGNOSIS — Z86718 Personal history of other venous thrombosis and embolism: Secondary | ICD-10-CM | POA: Diagnosis not present

## 2021-09-08 DIAGNOSIS — Z7952 Long term (current) use of systemic steroids: Secondary | ICD-10-CM | POA: Diagnosis not present

## 2021-09-08 DIAGNOSIS — I129 Hypertensive chronic kidney disease with stage 1 through stage 4 chronic kidney disease, or unspecified chronic kidney disease: Secondary | ICD-10-CM | POA: Diagnosis not present

## 2021-09-08 DIAGNOSIS — R627 Adult failure to thrive: Secondary | ICD-10-CM | POA: Diagnosis not present

## 2021-09-08 DIAGNOSIS — E1165 Type 2 diabetes mellitus with hyperglycemia: Secondary | ICD-10-CM | POA: Diagnosis not present

## 2021-09-08 DIAGNOSIS — M7989 Other specified soft tissue disorders: Secondary | ICD-10-CM | POA: Diagnosis not present

## 2021-09-08 DIAGNOSIS — D631 Anemia in chronic kidney disease: Secondary | ICD-10-CM | POA: Diagnosis not present

## 2021-09-08 DIAGNOSIS — Z833 Family history of diabetes mellitus: Secondary | ICD-10-CM

## 2021-09-08 DIAGNOSIS — E785 Hyperlipidemia, unspecified: Secondary | ICD-10-CM | POA: Diagnosis present

## 2021-09-08 DIAGNOSIS — M79662 Pain in left lower leg: Secondary | ICD-10-CM

## 2021-09-08 DIAGNOSIS — Z87891 Personal history of nicotine dependence: Secondary | ICD-10-CM | POA: Diagnosis not present

## 2021-09-08 DIAGNOSIS — E11649 Type 2 diabetes mellitus with hypoglycemia without coma: Secondary | ICD-10-CM | POA: Diagnosis present

## 2021-09-08 DIAGNOSIS — R5381 Other malaise: Secondary | ICD-10-CM | POA: Diagnosis not present

## 2021-09-08 DIAGNOSIS — Z86711 Personal history of pulmonary embolism: Secondary | ICD-10-CM

## 2021-09-08 DIAGNOSIS — Z79899 Other long term (current) drug therapy: Secondary | ICD-10-CM

## 2021-09-08 DIAGNOSIS — N184 Chronic kidney disease, stage 4 (severe): Secondary | ICD-10-CM | POA: Diagnosis not present

## 2021-09-08 DIAGNOSIS — M1990 Unspecified osteoarthritis, unspecified site: Secondary | ICD-10-CM | POA: Diagnosis not present

## 2021-09-08 DIAGNOSIS — Z794 Long term (current) use of insulin: Secondary | ICD-10-CM | POA: Diagnosis not present

## 2021-09-08 DIAGNOSIS — M79605 Pain in left leg: Secondary | ICD-10-CM | POA: Diagnosis not present

## 2021-09-08 DIAGNOSIS — L03116 Cellulitis of left lower limb: Secondary | ICD-10-CM | POA: Diagnosis not present

## 2021-09-08 LAB — CBC WITH DIFFERENTIAL/PLATELET
Abs Immature Granulocytes: 0.08 10*3/uL — ABNORMAL HIGH (ref 0.00–0.07)
Basophils Absolute: 0 10*3/uL (ref 0.0–0.1)
Basophils Relative: 0 %
Eosinophils Absolute: 0.1 10*3/uL (ref 0.0–0.5)
Eosinophils Relative: 1 %
HCT: 39.6 % (ref 39.0–52.0)
Hemoglobin: 12.6 g/dL — ABNORMAL LOW (ref 13.0–17.0)
Immature Granulocytes: 1 %
Lymphocytes Relative: 5 %
Lymphs Abs: 0.5 10*3/uL — ABNORMAL LOW (ref 0.7–4.0)
MCH: 29.7 pg (ref 26.0–34.0)
MCHC: 31.8 g/dL (ref 30.0–36.0)
MCV: 93.4 fL (ref 80.0–100.0)
Monocytes Absolute: 0.6 10*3/uL (ref 0.1–1.0)
Monocytes Relative: 6 %
Neutro Abs: 9.4 10*3/uL — ABNORMAL HIGH (ref 1.7–7.7)
Neutrophils Relative %: 87 %
Platelets: 192 10*3/uL (ref 150–400)
RBC: 4.24 MIL/uL (ref 4.22–5.81)
RDW: 13.5 % (ref 11.5–15.5)
WBC: 10.7 10*3/uL — ABNORMAL HIGH (ref 4.0–10.5)
nRBC: 0 % (ref 0.0–0.2)

## 2021-09-08 LAB — LACTIC ACID, PLASMA: Lactic Acid, Venous: 0.8 mmol/L (ref 0.5–1.9)

## 2021-09-08 LAB — COMPREHENSIVE METABOLIC PANEL
ALT: 15 U/L (ref 0–44)
AST: 14 U/L — ABNORMAL LOW (ref 15–41)
Albumin: 3.7 g/dL (ref 3.5–5.0)
Alkaline Phosphatase: 50 U/L (ref 38–126)
Anion gap: 8 (ref 5–15)
BUN: 52 mg/dL — ABNORMAL HIGH (ref 8–23)
CO2: 25 mmol/L (ref 22–32)
Calcium: 9.5 mg/dL (ref 8.9–10.3)
Chloride: 103 mmol/L (ref 98–111)
Creatinine, Ser: 2.67 mg/dL — ABNORMAL HIGH (ref 0.61–1.24)
GFR, Estimated: 25 mL/min — ABNORMAL LOW (ref >60)
Glucose, Bld: 95 mg/dL (ref 70–99)
Potassium: 4.4 mmol/L (ref 3.5–5.1)
Sodium: 136 mmol/L (ref 135–145)
Total Bilirubin: 0.5 mg/dL (ref 0.3–1.2)
Total Protein: 6.6 g/dL (ref 6.5–8.1)

## 2021-09-08 LAB — CBG MONITORING, ED: Glucose-Capillary: 101 mg/dL — ABNORMAL HIGH (ref 70–99)

## 2021-09-08 NOTE — ED Notes (Signed)
Patients daughter states her dads sugar needs to be checked d/t not eating all day and not taking any of his diabetes medicine ?

## 2021-09-08 NOTE — ED Provider Triage Note (Signed)
Emergency Medicine Provider Triage Evaluation Note ? ?[name] [name] , a 71 y.o. male  was evaluated in triage.  Pt complains of pain and swelling to left lower extremity.  Patient reports that he woke this morning with pain and swelling to left lower excelling.  Pain has got progressively worse throughout the day.  No recent falls or traumatic injuries. ? ?Patient has a history of renal transplant and is currently on immunosuppression.  History of DVT and PE; patient is currently on Eliquis. ? ?Patient denies any fever, chills, chest pain, shortness of breath, hemoptysis, numbness, weakness. ? ?Review of Systems  ?Positive: Leg swelling tenderness ?Negative: See above ? ?Physical Exam  ?BP 124/62 (BP Location: Left Arm)   Pulse 86   Temp 98.6 ?F (37 ?C) (Oral)   Resp 16   SpO2 100%  ?Gen:   Awake, no distress   ?Resp:  Normal effort  ?MSK:   Moves extremities without difficulty  ?Other:  DP pulse found by Doppler bilaterally.  Swelling, warmth, and erythema noted to left calf.  Patient has diffuse tenderness throughout entire left leg. ? ?Medical Decision Making  ?Medically screening exam initiated at 7:53 PM.  Appropriate orders placed.  [name] [name] was informed that the remainder of the evaluation will be completed by another provider, this initial triage assessment does not replace that evaluation, and the importance of remaining in the ED until their evaluation is complete. ? ?Confirmed for possible DVT versus infection.  Unable to obtain ultrasound imaging to evaluate for DVT at this time. ?  ?Loni Beckwith, PA-C ?09/08/21 1954 ? ?

## 2021-09-08 NOTE — ED Triage Notes (Signed)
Pt has left leg warmth and pain. Right leg pain with blister. Pt has history of DVT and is on eliquis. Swelling and pain started to left leg this morning and pt noticed blister to right left this morning also. ?

## 2021-09-09 ENCOUNTER — Ambulatory Visit: Payer: Medicare Other | Admitting: Podiatry

## 2021-09-09 DIAGNOSIS — Z7901 Long term (current) use of anticoagulants: Secondary | ICD-10-CM | POA: Diagnosis not present

## 2021-09-09 DIAGNOSIS — D631 Anemia in chronic kidney disease: Secondary | ICD-10-CM | POA: Diagnosis present

## 2021-09-09 DIAGNOSIS — L0390 Cellulitis, unspecified: Secondary | ICD-10-CM | POA: Diagnosis present

## 2021-09-09 DIAGNOSIS — E785 Hyperlipidemia, unspecified: Secondary | ICD-10-CM | POA: Diagnosis present

## 2021-09-09 DIAGNOSIS — Z7952 Long term (current) use of systemic steroids: Secondary | ICD-10-CM | POA: Diagnosis not present

## 2021-09-09 DIAGNOSIS — I129 Hypertensive chronic kidney disease with stage 1 through stage 4 chronic kidney disease, or unspecified chronic kidney disease: Secondary | ICD-10-CM | POA: Diagnosis present

## 2021-09-09 DIAGNOSIS — M1990 Unspecified osteoarthritis, unspecified site: Secondary | ICD-10-CM | POA: Diagnosis present

## 2021-09-09 DIAGNOSIS — Z833 Family history of diabetes mellitus: Secondary | ICD-10-CM | POA: Diagnosis not present

## 2021-09-09 DIAGNOSIS — Z87891 Personal history of nicotine dependence: Secondary | ICD-10-CM | POA: Diagnosis not present

## 2021-09-09 DIAGNOSIS — Z91048 Other nonmedicinal substance allergy status: Secondary | ICD-10-CM | POA: Diagnosis not present

## 2021-09-09 DIAGNOSIS — Z794 Long term (current) use of insulin: Secondary | ICD-10-CM | POA: Diagnosis not present

## 2021-09-09 DIAGNOSIS — E1165 Type 2 diabetes mellitus with hyperglycemia: Secondary | ICD-10-CM | POA: Diagnosis not present

## 2021-09-09 DIAGNOSIS — N184 Chronic kidney disease, stage 4 (severe): Secondary | ICD-10-CM | POA: Diagnosis present

## 2021-09-09 DIAGNOSIS — Z79899 Other long term (current) drug therapy: Secondary | ICD-10-CM | POA: Diagnosis not present

## 2021-09-09 DIAGNOSIS — E1140 Type 2 diabetes mellitus with diabetic neuropathy, unspecified: Secondary | ICD-10-CM | POA: Diagnosis present

## 2021-09-09 DIAGNOSIS — E1122 Type 2 diabetes mellitus with diabetic chronic kidney disease: Secondary | ICD-10-CM | POA: Diagnosis present

## 2021-09-09 DIAGNOSIS — Z86718 Personal history of other venous thrombosis and embolism: Secondary | ICD-10-CM | POA: Diagnosis not present

## 2021-09-09 DIAGNOSIS — Z9109 Other allergy status, other than to drugs and biological substances: Secondary | ICD-10-CM | POA: Diagnosis not present

## 2021-09-09 DIAGNOSIS — E11649 Type 2 diabetes mellitus with hypoglycemia without coma: Secondary | ICD-10-CM | POA: Diagnosis present

## 2021-09-09 DIAGNOSIS — I10 Essential (primary) hypertension: Secondary | ICD-10-CM | POA: Diagnosis not present

## 2021-09-09 DIAGNOSIS — L03116 Cellulitis of left lower limb: Principal | ICD-10-CM

## 2021-09-09 DIAGNOSIS — R627 Adult failure to thrive: Secondary | ICD-10-CM | POA: Diagnosis present

## 2021-09-09 DIAGNOSIS — R5381 Other malaise: Secondary | ICD-10-CM | POA: Diagnosis present

## 2021-09-09 DIAGNOSIS — Z940 Kidney transplant status: Secondary | ICD-10-CM | POA: Diagnosis not present

## 2021-09-09 DIAGNOSIS — Z86711 Personal history of pulmonary embolism: Secondary | ICD-10-CM | POA: Diagnosis not present

## 2021-09-09 LAB — CBC WITH DIFFERENTIAL/PLATELET
Abs Immature Granulocytes: 0.06 10*3/uL (ref 0.00–0.07)
Basophils Absolute: 0 10*3/uL (ref 0.0–0.1)
Basophils Relative: 0 %
Eosinophils Absolute: 0.1 10*3/uL (ref 0.0–0.5)
Eosinophils Relative: 1 %
HCT: 37 % — ABNORMAL LOW (ref 39.0–52.0)
Hemoglobin: 11.5 g/dL — ABNORMAL LOW (ref 13.0–17.0)
Immature Granulocytes: 1 %
Lymphocytes Relative: 5 %
Lymphs Abs: 0.6 10*3/uL — ABNORMAL LOW (ref 0.7–4.0)
MCH: 29.3 pg (ref 26.0–34.0)
MCHC: 31.1 g/dL (ref 30.0–36.0)
MCV: 94.1 fL (ref 80.0–100.0)
Monocytes Absolute: 0.7 10*3/uL (ref 0.1–1.0)
Monocytes Relative: 6 %
Neutro Abs: 9.5 10*3/uL — ABNORMAL HIGH (ref 1.7–7.7)
Neutrophils Relative %: 87 %
Platelets: 160 10*3/uL (ref 150–400)
RBC: 3.93 MIL/uL — ABNORMAL LOW (ref 4.22–5.81)
RDW: 13.6 % (ref 11.5–15.5)
WBC: 10.9 10*3/uL — ABNORMAL HIGH (ref 4.0–10.5)
nRBC: 0 % (ref 0.0–0.2)

## 2021-09-09 LAB — COMPREHENSIVE METABOLIC PANEL
ALT: 13 U/L (ref 0–44)
AST: 15 U/L (ref 15–41)
Albumin: 3.2 g/dL — ABNORMAL LOW (ref 3.5–5.0)
Alkaline Phosphatase: 44 U/L (ref 38–126)
Anion gap: 9 (ref 5–15)
BUN: 55 mg/dL — ABNORMAL HIGH (ref 8–23)
CO2: 22 mmol/L (ref 22–32)
Calcium: 8.8 mg/dL — ABNORMAL LOW (ref 8.9–10.3)
Chloride: 101 mmol/L (ref 98–111)
Creatinine, Ser: 2.69 mg/dL — ABNORMAL HIGH (ref 0.61–1.24)
GFR, Estimated: 24 mL/min — ABNORMAL LOW (ref >60)
Glucose, Bld: 211 mg/dL — ABNORMAL HIGH (ref 70–99)
Potassium: 4.3 mmol/L (ref 3.5–5.1)
Sodium: 132 mmol/L — ABNORMAL LOW (ref 135–145)
Total Bilirubin: 0.5 mg/dL (ref 0.3–1.2)
Total Protein: 5.6 g/dL — ABNORMAL LOW (ref 6.5–8.1)

## 2021-09-09 LAB — MAGNESIUM: Magnesium: 1.8 mg/dL (ref 1.7–2.4)

## 2021-09-09 LAB — CBG MONITORING, ED
Glucose-Capillary: 69 mg/dL — ABNORMAL LOW (ref 70–99)
Glucose-Capillary: 163 mg/dL — ABNORMAL HIGH (ref 70–99)
Glucose-Capillary: 333 mg/dL — ABNORMAL HIGH (ref 70–99)
Glucose-Capillary: 228 mg/dL — ABNORMAL HIGH (ref 70–99)

## 2021-09-09 LAB — MRSA NEXT GEN BY PCR, NASAL: MRSA by PCR Next Gen: NOT DETECTED

## 2021-09-09 LAB — PHOSPHORUS: Phosphorus: 3.6 mg/dL (ref 2.5–4.6)

## 2021-09-09 LAB — LACTIC ACID, PLASMA: Lactic Acid, Venous: 0.9 mmol/L (ref 0.5–1.9)

## 2021-09-09 MED ORDER — SODIUM CHLORIDE 0.9 % IV SOLN
2.0000 g | INTRAVENOUS | Status: DC
  Administered 2021-09-09 – 2021-09-10 (×2): 2 g via INTRAVENOUS
  Filled 2021-09-09 (×2): qty 20

## 2021-09-09 MED ORDER — INSULIN ASPART 100 UNIT/ML IJ SOLN
0.0000 [IU] | Freq: Three times a day (TID) | INTRAMUSCULAR | Status: DC
  Administered 2021-09-09: 9 [IU] via SUBCUTANEOUS
  Administered 2021-09-10: 2 [IU] via SUBCUTANEOUS
  Administered 2021-09-11: 3 [IU] via SUBCUTANEOUS
  Administered 2021-09-11: 5 [IU] via SUBCUTANEOUS

## 2021-09-09 MED ORDER — APIXABAN 5 MG PO TABS
5.0000 mg | ORAL_TABLET | Freq: Two times a day (BID) | ORAL | Status: DC
  Administered 2021-09-09 – 2021-09-11 (×5): 5 mg via ORAL
  Filled 2021-09-09 (×5): qty 1

## 2021-09-09 MED ORDER — APIXABAN 5 MG PO TABS
5.0000 mg | ORAL_TABLET | Freq: Once | ORAL | Status: AC
  Administered 2021-09-09: 5 mg via ORAL
  Filled 2021-09-09: qty 1

## 2021-09-09 MED ORDER — LACTATED RINGERS IV SOLN: INTRAVENOUS | Status: AC

## 2021-09-09 MED ORDER — ENSURE ENLIVE PO LIQD
237.0000 mL | Freq: Two times a day (BID) | ORAL | Status: DC
  Administered 2021-09-10 – 2021-09-11 (×4): 237 mL via ORAL
  Filled 2021-09-09: qty 237

## 2021-09-09 MED ORDER — ROSUVASTATIN CALCIUM 20 MG PO TABS
20.0000 mg | ORAL_TABLET | Freq: Every day | ORAL | Status: DC
  Administered 2021-09-09 – 2021-09-11 (×3): 20 mg via ORAL
  Filled 2021-09-09 (×3): qty 1

## 2021-09-09 MED ORDER — MORPHINE SULFATE (PF) 4 MG/ML IV SOLN
4.0000 mg | Freq: Once | INTRAVENOUS | Status: AC
  Administered 2021-09-09: 4 mg via INTRAVENOUS
  Filled 2021-09-09: qty 1

## 2021-09-09 MED ORDER — THIAMINE HCL 100 MG PO TABS
100.0000 mg | ORAL_TABLET | Freq: Every day | ORAL | Status: DC
  Administered 2021-09-09 – 2021-09-11 (×3): 100 mg via ORAL
  Filled 2021-09-09 (×3): qty 1

## 2021-09-09 MED ORDER — SODIUM CHLORIDE 0.9 % IV SOLN
1.0000 g | Freq: Once | INTRAVENOUS | Status: AC
  Administered 2021-09-09: 1 g via INTRAVENOUS
  Filled 2021-09-09: qty 10

## 2021-09-09 MED ORDER — HYDROCODONE-ACETAMINOPHEN 5-325 MG PO TABS
1.0000 | ORAL_TABLET | Freq: Once | ORAL | Status: AC
  Administered 2021-09-09: 1 via ORAL
  Filled 2021-09-09: qty 1

## 2021-09-09 MED ORDER — FLUCONAZOLE 100 MG PO TABS
400.0000 mg | ORAL_TABLET | Freq: Every day | ORAL | Status: DC
  Administered 2021-09-09 – 2021-09-11 (×3): 400 mg via ORAL
  Filled 2021-09-09 (×2): qty 4
  Filled 2021-09-09: qty 2

## 2021-09-09 MED ORDER — FOLIC ACID 1 MG PO TABS
1.0000 mg | ORAL_TABLET | Freq: Every day | ORAL | Status: DC
Start: 2021-09-09 — End: 2021-09-11
  Administered 2021-09-09 – 2021-09-11 (×3): 1 mg via ORAL
  Filled 2021-09-09 (×3): qty 1

## 2021-09-09 MED ORDER — LIDOCAINE 5 % EX PTCH: 1.0000 | MEDICATED_PATCH | CUTANEOUS | Status: DC

## 2021-09-09 MED ORDER — PREDNISONE 10 MG PO TABS
10.0000 mg | ORAL_TABLET | Freq: Every day | ORAL | Status: DC
  Administered 2021-09-09 – 2021-09-11 (×3): 10 mg via ORAL
  Filled 2021-09-09 (×3): qty 1

## 2021-09-09 MED ORDER — MELATONIN 3 MG PO TABS
6.0000 mg | ORAL_TABLET | Freq: Every day | ORAL | Status: DC
  Administered 2021-09-09 – 2021-09-10 (×2): 6 mg via ORAL
  Filled 2021-09-09 (×2): qty 2

## 2021-09-09 MED ORDER — ACETAMINOPHEN 325 MG PO TABS: 650.0000 mg | ORAL_TABLET | Freq: Four times a day (QID) | ORAL | Status: DC | PRN

## 2021-09-09 MED ORDER — ACYCLOVIR 200 MG PO CAPS: 400.0000 mg | ORAL_CAPSULE | Freq: Two times a day (BID) | ORAL | Status: DC

## 2021-09-09 MED ORDER — TACROLIMUS 1 MG PO CAPS
1.0000 mg | ORAL_CAPSULE | Freq: Two times a day (BID) | ORAL | Status: DC
  Administered 2021-09-09 – 2021-09-11 (×5): 1 mg via ORAL
  Filled 2021-09-09 (×5): qty 1

## 2021-09-09 MED ORDER — ATOVAQUONE 750 MG/5ML PO SUSP: 1500.0000 mg | Freq: Every morning | ORAL | Status: DC

## 2021-09-09 MED ORDER — OXYCODONE HCL 5 MG PO TABS: 5.0000 mg | ORAL_TABLET | Freq: Four times a day (QID) | ORAL | Status: DC | PRN

## 2021-09-09 MED ORDER — HYDROMORPHONE HCL 1 MG/ML IJ SOLN: 0.5000 mg | INTRAMUSCULAR | Status: DC | PRN

## 2021-09-09 MED ORDER — INSULIN ASPART 100 UNIT/ML IJ SOLN
0.0000 [IU] | Freq: Every day | INTRAMUSCULAR | Status: DC
  Administered 2021-09-09 – 2021-09-10 (×2): 2 [IU] via SUBCUTANEOUS

## 2021-09-09 MED ORDER — PROCHLORPERAZINE EDISYLATE 10 MG/2ML IJ SOLN: 10.0000 mg | Freq: Four times a day (QID) | INTRAMUSCULAR | Status: DC | PRN

## 2021-09-09 MED ORDER — POLYETHYLENE GLYCOL 3350 17 G PO PACK
17.0000 g | PACK | Freq: Every day | ORAL | Status: DC | PRN
Start: 2021-09-09 — End: 2021-09-11

## 2021-09-09 MED ORDER — OXYBUTYNIN CHLORIDE ER 5 MG PO TB24
5.0000 mg | ORAL_TABLET | Freq: Every day | ORAL | Status: DC
Start: 2021-09-09 — End: 2021-09-11
  Administered 2021-09-09 – 2021-09-11 (×3): 5 mg via ORAL
  Filled 2021-09-09 (×3): qty 1

## 2021-09-09 NOTE — Progress Notes (Signed)
Inpatient Diabetes Program Recommendations ? ?AACE/ADA: New Consensus Statement on Inpatient Glycemic Control (2015) ? ?Target Ranges:  Prepandial:   less than 140 mg/dL ?     Peak postprandial:   less than 180 mg/dL (1-2 hours) ?     Critically ill patients:  140 - 180 mg/dL  ? ?Lab Results  ?Component Value Date  ? GLUCAP 163 (H) 09/09/2021  ? HGBA1C 7.2 01/30/2021  ? ? ?Review of Glycemic Control ? ?Diabetes history: DM2 ?Outpatient Diabetes medications: Lantus 8 units qd, Novolog 5 units tid ac meals, Novolog correction scale (see below) ?Current orders for Inpatient glycemic control: Prednisone 10 mg qd ? ?Inpatient Diabetes Program Recommendations:   ?-Add Novolog 0-9 units tid + hs 0-5 units ? ?From Endocrinologist office visit 07/08/21 with NP Lamb-Dixon: ?"INSULIN INSTRUCTIONS: ? ?Novolog: (short acting insulin) ?Inject 5 units before breakfast ?Inject 5 units before lunch ?Inject 5 units before dinner ?-------------------------------------------------------------------------------------------- ?Lantus: (long acting insulin) ?Inject 8 units at bedtime ?-------------------------------------------------------------------------------------------- ? ?FOR HIGH BLOOD SUGARS before meals ?Novolog sliding scale: ?If blood sugars are: Add: ?151-200--------------------->add 1 units ?201-250--------------------->add 2 units ?251-300--------------------->add 3 units ?301-350--------------------->add 4 units  ?351-400--------------------->add 5 units  ?>400 --------------------->add 6 units " ? ?Thank you, ?Nani Gasser Lindora Alviar, RN, MSN, CDE  ?Diabetes Coordinator ?Inpatient Glycemic Control Team ?Team Pager [phone] (8am-5pm) ?09/09/2021 1:26 PM ? ? ? ? ?

## 2021-09-09 NOTE — ED Notes (Signed)
Patient presents with left leg warmth and pain and blister on right leg. Patient rates pain 6/10. ?

## 2021-09-09 NOTE — Progress Notes (Addendum)
?PROGRESS NOTE ? ? ? ?[name] [name]  EYC:[id number] DOB: [birth year] DOA: 09/08/2021 ?PCP: [name], [name], MD  ? ?Chief Complaint  ?Patient presents with  ? Leg Pain  ? ? ?Brief Narrative:  ? ? ?This is a no charge note as patient was seen and admitted earlier today by Dr. Armando Reichert, patient was seen and examined, chart, imaging and labs were reviewed. ? ? ?[name] [name] is a 71 y.o. male with medical history significant for renal transplant on immunosuppression therapy, PE on Eliquis, HLD, failure to thrive in an adult who presented to [facility] ED from home due to left leg pain up to his left thigh.  Associated with edema, warmth and erythema with concern for cellulitis.  He noticed the pain and the erythema on 09/07/21.  His symptoms gradually worsened.  No subjective fevers or chills.  Due to concern for cellulitis in an immunocompromised state, EDP started empiric IV antibiotics and requested admission for further IV antibiotic treatment given he is immunocompromised. ?  ? ? ?Assessment & Plan: ?  ?Principal Problem: ?  Cellulitis ? ?Left lower extremity cellulitis, POA ?Continue with IV Rocephin for now, his MRSA screen test is negative ?Follow on blood cultures ?Continue Rocephin started in the ED. ?Obtain MRSA screening test. ?Monitor fever curve and WBC. ?Pain management and bowel regimen in place. ?Gentle IV fluid hydration LR at 50 cc/h x 1 day. ?  ? ?Post renal transplant ?Follows at [facility] ?Resume home immunosuppression therapy ?  ?Failure to thrive in adult ?Physical debility ?BMI 20 ?Severe muscle mass loss ?Encourage oral protein calorie intake ?Oral supplement ?PT OT to assess ?Fall precautions ?TOC consulted to assist with DC planning. ?  ?Hyperlipidemia ?Resume home Crestor ?  ?History of pulmonary embolism ?Resume home Eliquis. ?  ?CKD 4/post renal transplantation ?Appears to be at his baseline creatinine of 2.6 with GFR of 25 ?Avoid nephrotoxic agents, dehydration and  hypotension. ?Continue gentle IV fluid hydration ?Closely monitor urine output with strict I's and O's. ?Repeat renal panel in the morning ? ? ? ?DVT prophylaxis: on ELiquis ?Code Status: Full ?Family Communication: None at bedside ?Disposition:  ? ?Status is: Inpatient ? ?  ?Consultants:  ?None ? ? ?Subjective: ? ?Patient denies any fever, chills, no nausea or vomiting ? ?Objective: ?Vitals:  ? 09/09/21 1036 09/09/21 1140 09/09/21 1349 09/09/21 1605  ?BP: (!) 153/69 (!) 145/73 (!) 142/67 133/68  ?Pulse: 79 80 78 81  ?Resp: '12 13 12 18  '$ ?Temp:      ?TempSrc:      ?SpO2: 98% 97% 98% 96%  ?Weight:      ?Height:      ? ?No intake or output data in the 24 hours ending 09/09/21 1700 ?Filed Weights  ? 09/08/21 1958  ?Weight: 63.5 kg  ? ? ?Examination: ? ?Awake Alert, Oriented X 3, No new F.N deficits, Normal affect ?Symmetrical Chest wall movement, Good air movement bilaterally, CTAB ?RRR,No Gallops,Rubs or new Murmurs, No Parasternal Heave ?+ve B.Sounds, Abd Soft, No tenderness, No rebound - guarding or rigidity. ?No Cyanosis, patient with significant left upper medial thigh area erythema, tenderness and swelling and cellulitis, has right lower extremity wound. ? ? ? ?Data Reviewed: I have personally reviewed following labs and imaging studies ? ?CBC: ?Recent Labs  ?Lab 09/08/21 ?2027 09/09/21 ?1497  ?WBC 10.7* 10.9*  ?NEUTROABS 9.4* 9.5*  ?HGB 12.6* 11.5*  ?HCT 39.6 37.0*  ?MCV 93.4 94.1  ?PLT 192 160  ? ? ?Basic Metabolic Panel: ?  Recent Labs  ?Lab 09/08/21 ?2027 09/09/21 ?8921  ?NA 136 132*  ?K 4.4 4.3  ?CL 103 101  ?CO2 25 22  ?GLUCOSE 95 211*  ?BUN 52* 55*  ?CREATININE 2.67* 2.69*  ?CALCIUM 9.5 8.8*  ?MG  --  1.8  ?PHOS  --  3.6  ? ? ?GFR: ?Estimated Creatinine Clearance: 22.3 mL/min (A) (by C-G formula based on SCr of 2.69 mg/dL (H)). ? ?Liver Function Tests: ?Recent Labs  ?Lab 09/08/21 ?2027 09/09/21 ?1941  ?AST 14* 15  ?ALT 15 13  ?ALKPHOS 50 44  ?BILITOT 0.5 0.5  ?PROT 6.6 5.6*  ?ALBUMIN 3.7 3.2*   ? ? ?CBG: ?Recent Labs  ?Lab 09/08/21 ?1929 09/09/21 ?0103 09/09/21 ?0226  ?GLUCAP 101* 69* 163*  ? ? ? ?Recent Results (from the past 240 hour(s))  ?MRSA Next Gen by PCR, Nasal     Status: None  ? Collection Time: 09/09/21  4:46 AM  ? Specimen: Nasal Mucosa; Nasal Swab  ?Result Value Ref Range Status  ? MRSA by PCR Next Gen NOT DETECTED NOT DETECTED Final  ?  Comment: (NOTE) ?The GeneXpert MRSA Assay (FDA approved for NASAL specimens only), ?is one component of a comprehensive MRSA colonization surveillance ?program. It is not intended to diagnose MRSA infection nor to guide ?or monitor treatment for MRSA infections. ?Test performance is not FDA approved in patients less than 2 years ?old. ?Performed at [facility], [place] [address], [place], [place] ?[address] ?  ?  ? ? ? ? ? ?Radiology Studies: ?No results found. ? ? ? ? ? ?Scheduled Meds: ? acyclovir  400 mg Oral BID  ? apixaban  5 mg Oral BID  ? atovaquone  1,500 mg Oral q morning  ? feeding supplement  237 mL Oral BID BM  ? fluconazole  400 mg Oral Daily  ? folic acid  1 mg Oral Daily  ? insulin aspart  0-5 Units Subcutaneous QHS  ? insulin aspart  0-9 Units Subcutaneous TID WC  ? lidocaine  1 patch Transdermal See admin instructions  ? melatonin  6 mg Oral QHS  ? oxybutynin  5 mg Oral Daily  ? predniSONE  10 mg Oral Q breakfast  ? rosuvastatin  20 mg Oral Daily  ? tacrolimus  1 mg Oral BID  ? thiamine  100 mg Oral Daily  ? ?Continuous Infusions: ? cefTRIAXone (ROCEPHIN)  IV    ? lactated ringers 50 mL/hr at 09/09/21 0630  ? ? ? LOS: 0 days  ? ? ? ?Phillips Climes, MD ?[facility] ? ? ?To contact the attending provider between 7A-7P or the covering provider during after hours 7P-7A, please log into the web site [email or web address] and access using universal [place] password for that web site. If you do not have the password, please call the hospital operator. ? ?09/09/2021, 5:00 PM  ?  ?

## 2021-09-09 NOTE — ED Provider Notes (Signed)
?[facility] ?Provider Note ? ? ?CSN: [id number] ?Arrival date & time: 09/08/21  1806 ? ?  ? ?History ? ?Chief Complaint  ?Patient presents with  ? Leg Pain  ? ? ?[name] [name] is a 71 y.o. male. ? ?HPI ? ?  ? ?This is a 71 year old male with history of renal transplant on immunosuppression, DVT, PE on Eliquis who presents with left leg pain and swelling.  Per the patient's daughter, he is complaining of left leg pain and swelling worsening over the last day.  It has kept him in bed.  He states he has not been able to get up to take his medications.  Until today he had been compliant with his Eliquis.  No fevers.  His DVT was previously in the left leg but states that "ultrasound showed that it went away."  Patient states the pain is mostly in his left thigh.  Daughter is noted increasing swelling.  She states that his legs swell frequently; however, his left leg appears more swollen. ? ?Home Medications ?Prior to Admission medications   ?Medication Sig Start Date End Date Taking? Authorizing Provider  ?acetaminophen (TYLENOL) 325 MG tablet Take 2 tablets (650 mg total) by mouth every 6 (six) hours as needed for mild pain (or Fever >/= 101). 07/07/20   [name], MD  ?acyclovir (ZOVIRAX) 200 MG capsule Take 400 mg by mouth 2 (two) times daily. 09/12/20   [provider]  ?amLODipine (NORVASC) 5 MG tablet Take 1 tablet (5 mg total) by mouth daily. 07/07/20   [name], MD  ?APIXABAN [facility]) VTE STARTER PACK ('10MG'$  AND '5MG'$ ) Take as directed on package: start with two-'5mg'$  tablets twice daily for 7 days. On day 8, switch to one-'5mg'$  tablet twice daily. 09/28/20   Patrecia Pour, MD  ?atovaquone [facility]) 750 MG/5ML suspension Take 1,500 mg by mouth every morning. 09/12/20   [provider]  ?carvedilol (COREG) 6.25 MG tablet Take 6.25 mg by mouth 2 (two) times daily. 09/11/20   [provider]  ?Cholecalciferol (VITAMIN D3) 50 MCG (2000 UT) CAPS  Take 2,000 Units by mouth daily.    [provider]  ?[employer] SENNA PLUS 8.6-50 MG tablet Take 2 tablets by mouth daily. 09/12/20   [provider]  ?famotidine (PEPCID) 20 MG tablet Take 20 mg by mouth 2 (two) times daily. 09/12/20   [provider]  ?fluconazole (DIFLUCAN) 200 MG tablet Take 400 mg by mouth daily. 09/23/20   [provider]  ?folic acid (FOLVITE) 1 MG tablet Take 1 mg by mouth daily. 09/12/20   [provider]  ?insulin aspart (NOVOLOG) 100 UNIT/ML injection Inject 0-15 Units into the skin 4 (four) times daily - after meals and at bedtime. ?Patient taking differently: Inject 5 Units into the skin 4 (four) times daily - after meals and at bedtime. 07/07/20   [name], MD  ?lidocaine (LIDODERM) 5 % Place 1 patch onto the skin See admin instructions. Remove & Discard patch within 12 hours or as directed by MD    [provider]  ?Melatonin 3 MG TBDP Take 6 mg by mouth at bedtime. 09/13/20   [provider]  ?oxybutynin (DITROPAN-XL) 5 MG 24 hr tablet Take 5 mg by mouth daily. 06/02/20   [provider]  ?predniSONE (DELTASONE) 5 MG tablet Take 10 mg by mouth daily with breakfast.    [provider]  ?rosuvastatin (CRESTOR) 20 MG tablet TAKE 1  TABLET BY MOUTH EVERY DAY 08/18/21   [name], [name], MD  ?tacrolimus (PROGRAF) 1 MG capsule Take 1 mg by mouth 2 (two) times daily. 10/31/19   [provider]  ?thiamine 100 MG tablet Take 100 mg by mouth daily. 09/11/20   [provider]  ?   ? ?Allergies    ?Other, No known allergies, Bag balm  [albolene], and Cold cream   ? ?Review of Systems   ?Review of Systems  ?Constitutional:  Negative for fever.  ?Respiratory:  Negative for shortness of breath.   ?Cardiovascular:  Positive for leg swelling. Negative for chest pain.  ?Gastrointestinal:  Negative for abdominal pain, nausea and vomiting.  ?All other systems reviewed and are negative. ? ?Physical  Exam ?Updated Vital Signs ?BP (!) 118/57 (BP Location: Left Arm)   Pulse 83   Temp 98.3 ?F (36.8 ?C) (Oral)   Resp 18   Ht 1.753 m ('5\' 9"'$ )   Wt 63.5 kg   SpO2 96%   BMI 20.67 kg/m?  ?Physical Exam ?Vitals and nursing note reviewed.  ?Constitutional:   ?   Appearance: He is well-developed.  ?   Comments: Elderly, chronically ill-appearing  ?HENT:  ?   Head: Normocephalic and atraumatic.  ?   Mouth/Throat:  ?   Mouth: Mucous membranes are dry.  ?Eyes:  ?   Pupils: Pupils are equal, round, and reactive to light.  ?Cardiovascular:  ?   Rate and Rhythm: Normal rate and regular rhythm.  ?   Heart sounds: Normal heart sounds. No murmur heard. ?Pulmonary:  ?   Effort: Pulmonary effort is normal. No respiratory distress.  ?   Breath sounds: Normal breath sounds. No wheezing.  ?Abdominal:  ?   General: Bowel sounds are normal.  ?   Palpations: Abdomen is soft.  ?   Tenderness: There is no abdominal tenderness. There is no rebound.  ?Musculoskeletal:  ?   Cervical back: Neck supple.  ?   Comments: Left greater than right lower extremity edema, pitting to the knee, dopplerable DP pulses bilaterally, feet are warm bilaterally, patient has a large blister on the right anterior leg, left lower extremity with erythema into the medial thigh with some associated swelling  ?Lymphadenopathy:  ?   Cervical: No cervical adenopathy.  ?Skin: ?   General: Skin is warm and dry.  ?Neurological:  ?   Mental Status: He is alert and oriented to person, place, and time.  ?Psychiatric:     ?   Mood and Affect: Mood normal.  ? ? ?ED Results / Procedures / Treatments   ?Labs ?(all labs ordered are listed, but only abnormal results are displayed) ?Labs Reviewed  ?COMPREHENSIVE METABOLIC PANEL - Abnormal; Notable for the following components:  ?    Result Value  ? BUN 52 (*)   ? Creatinine, Ser 2.67 (*)   ? AST 14 (*)   ? GFR, Estimated 25 (*)   ? All other components within normal limits  ?CBC WITH DIFFERENTIAL/PLATELET - Abnormal; Notable for  the following components:  ? WBC 10.7 (*)   ? Hemoglobin 12.6 (*)   ? Neutro Abs 9.4 (*)   ? Lymphs Abs 0.5 (*)   ? Abs Immature Granulocytes 0.08 (*)   ? All other components within normal limits  ?CBG MONITORING, ED - Abnormal; Notable for the following components:  ? Glucose-Capillary 101 (*)   ? All other components within normal limits  ?CBG MONITORING, ED - Abnormal; Notable for  the following components:  ? Glucose-Capillary 69 (*)   ? All other components within normal limits  ?CBG MONITORING, ED - Abnormal; Notable for the following components:  ? Glucose-Capillary 163 (*)   ? All other components within normal limits  ?CULTURE, BLOOD (ROUTINE X 2)  ?CULTURE, BLOOD (ROUTINE X 2)  ?MRSA NEXT GEN BY PCR, NASAL  ?LACTIC ACID, PLASMA  ?LACTIC ACID, PLASMA  ? ? ?EKG ?None ? ?Radiology ?No results found. ? ?Procedures ?Procedures  ? ? ?Medications Ordered in ED ?Medications  ?cefTRIAXone (ROCEPHIN) 2 g in sodium chloride 0.9 % 100 mL IVPB (has no administration in time range)  ?acyclovir (ZOVIRAX) 200 MG capsule 400 mg (has no administration in time range)  ?acetaminophen (TYLENOL) tablet 650 mg (has no administration in time range)  ?atovaquone (MEPRON) 750 MG/5ML suspension 1,500 mg (has no administration in time range)  ?fluconazole (DIFLUCAN) tablet 400 mg (has no administration in time range)  ?folic acid (FOLVITE) tablet 1 mg (has no administration in time range)  ?thiamine tablet 100 mg (has no administration in time range)  ?tacrolimus (PROGRAF) capsule 1 mg (has no administration in time range)  ?rosuvastatin (CRESTOR) tablet 20 mg (has no administration in time range)  ?predniSONE (DELTASONE) tablet 10 mg (has no administration in time range)  ?oxybutynin (DITROPAN-XL) 24 hr tablet 5 mg (has no administration in time range)  ?melatonin tablet 6 mg (has no administration in time range)  ?lidocaine (LIDODERM) 5 % 1 patch (has no administration in time range)  ?lactated ringers infusion (has no  administration in time range)  ?prochlorperazine (COMPAZINE) injection 10 mg (has no administration in time range)  ?polyethylene glycol (MIRALAX / GLYCOLAX) packet 17 g (has no administration in time range)  ?oxyCODONE (Oxy IR/ROX

## 2021-09-09 NOTE — ED Notes (Signed)
Pt has 1+ swelling of LLE, 1+ left pedal pulse, hot to touch, cap refill less than 3 sec, pt able to wiggle toes. ?

## 2021-09-09 NOTE — ED Notes (Signed)
Patient was able to get OOB, but was unable to stand and put weight on his left leg d/t pain.  Patient states that the pain became "throbbing" once he put his foot down and was unable to take any steps even with his cane.  MD Horton made aware.  ?

## 2021-09-09 NOTE — Progress Notes (Signed)
PT Cancellation Note ? ?Patient Details ?Name: [name] [name] ?MRN: [id number] ?DOB: [birth year] ? ? ?Cancelled Treatment:    Reason Eval/Treat Not Completed: Medical issues which prohibited therapy.  Awaiting doppler for susp DVT on LLE at thigh.  Follow up at another time. ? ? ?Ramond Dial ?09/09/2021, 12:04 PM ? ?Mee Hives, PT PhD ?Acute Rehab Dept. Number: [facility] [phone] and [place] [phone] ? ?

## 2021-09-09 NOTE — ED Notes (Signed)
Breakfast order placed ?

## 2021-09-09 NOTE — Progress Notes (Signed)
Pharmacy Communication Note: ? 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 Discussed case with attending MD and both medications were stopped. This is confirmed by patient's outpatient medication fill history as neither med has been be recently filled.  ? ?Albertina Parr, PharmD., BCCCP ?Clinical Pharmacist ?Please refer to [facility] for unit-specific pharmacist  ? ?

## 2021-09-09 NOTE — ED Notes (Signed)
Patient given food for low CBG. ?

## 2021-09-09 NOTE — H&P (Signed)
?History and Physical ? ?[name] [name]:[id number] DOB: 05/01/1951 DOA: 09/08/2021 ? ?Referring physician: Dr. Dina Rich, [facility]  ?PCP: [name], [name], MD  ?Outpatient Specialists: Renal transplant team at [place]  ?Patient coming from: Home  ? ?Chief Complaint:  LLE pain, swelling, warm, and redness ? ?HPI: [name] [name] is a 71 y.o. male with medical history significant for renal transplant on immunosuppression therapy, PE on Eliquis, HLD, failure to thrive in an adult who presented to [facility] ED from home due to left leg pain up to his left thigh.  Associated with edema, warmth and erythema with concern for cellulitis.  He noticed the pain and the erythema on 09/07/21.  His symptoms gradually worsened.  No subjective fevers or chills.  Due to concern for cellulitis in an immunocompromised state, EDP started empiric IV antibiotics and requested admission for further management.  Patient admitted by hospitalist service. ? ?ED Course: Tmax 98.6.  BP 118/57, pulse 83, respiration rate 18, O2 saturation 96% on room air.  Lab studies remarkable for BUN 52, creatinine 2.67, GFR 25.  WBC 10.7, hemoglobin 12.6, neutrophil count 9.4, lymphocyte count 0.5. ? ?Review of Systems: ?Review of systems as noted in the HPI. All other systems reviewed and are negative. ? ? ?Past Medical History:  ?Diagnosis Date  ? Anemia   ? as a younger man  ? Arthritis   ? hands  ? Cataract   ? Chronic kidney disease   ? "starting to bother me now" (07/28/2012)  ? Diabetes mellitus   ?  type II  ? Diarrhea, functional   ? ED (erectile dysfunction)   ? Hypertension   ? Neuropathy   ? Peripheral neuropathy   ? ?Past Surgical History:  ?Procedure Laterality Date  ? APPENDECTOMY  1950's  ? AV FISTULA PLACEMENT Right 05/14/2016  ? Procedure: ARTERIOVENOUS (AV) FISTULA CREATION- RIGHT ARM;  Surgeon: Serafina Mitchell, MD;  Location: [facility];  Service: Vascular;  Laterality: Right;  ? COLONOSCOPY    ? KIDNEY TRANSPLANT Right   ? PROSTATE SURGERY    ?  UPPER GASTROINTESTINAL ENDOSCOPY    ? ? ?Social History:  reports that he quit smoking about 35 years ago. His smoking use included cigarettes. He has a 1.20 pack-year smoking history. He has never used smokeless tobacco. He reports that he does not currently use alcohol after a past usage of about 1.0 standard drink per week. He reports that he does not use drugs. ? ? ?Allergies  ?Allergen Reactions  ? Other Rash  ?  Oxyquinoline-white Pet-lanolin  ? No Known Allergies   ? Bag Balm  [Albolene] Rash  ? Cold Cream Rash  ? ? ?Family History  ?Problem Relation Age of Onset  ? Colon polyps Mother   ? Stroke Mother   ? Diabetes Father   ? Diabetes Cousin   ? Prostate cancer Brother   ? Other Daughter   ?     kidney problems  ? Esophageal cancer Maternal Aunt   ? Colon cancer Neg Hx   ? Stomach cancer Neg Hx   ? Rectal cancer Neg Hx   ?  ? ? ?Prior to Admission medications   ?Medication Sig Start Date End Date Taking? Authorizing Provider  ?acetaminophen (TYLENOL) 325 MG tablet Take 2 tablets (650 mg total) by mouth every 6 (six) hours as needed for mild pain (or Fever >/= 101). 07/07/20   [name], MD  ?acyclovir (ZOVIRAX) 200 MG capsule Take 400 mg by  mouth 2 (two) times daily. 09/12/20   [provider]  ?amLODipine (NORVASC) 5 MG tablet Take 1 tablet (5 mg total) by mouth daily. 07/07/20   [name], MD  ?APIXABAN [facility] [place]) VTE STARTER PACK ('10MG'$  AND '5MG'$ ) Take as directed on package: start with two-'5mg'$  tablets twice daily for 7 days. On day 8, switch to one-'5mg'$  tablet twice daily. 09/28/20   Patrecia Pour, MD  ?atovaquone [facility]) 750 MG/5ML suspension Take 1,500 mg by mouth every morning. 09/12/20   [provider]  ?carvedilol (COREG) 6.25 MG tablet Take 6.25 mg by mouth 2 (two) times daily. 09/11/20   [provider]  ?Cholecalciferol (VITAMIN D3) 50 MCG (2000 UT) CAPS Take 2,000 Units by mouth daily.    [provider]  ?[facility]S SENNA PLUS 8.6-50 MG tablet Take 2 tablets by  mouth daily. 09/12/20   [provider]  ?famotidine (PEPCID) 20 MG tablet Take 20 mg by mouth 2 (two) times daily. 09/12/20   [provider]  ?fluconazole (DIFLUCAN) 200 MG tablet Take 400 mg by mouth daily. 09/23/20   [provider]  ?folic acid (FOLVITE) 1 MG tablet Take 1 mg by mouth daily. 09/12/20   [provider]  ?insulin aspart (NOVOLOG) 100 UNIT/ML injection Inject 0-15 Units into the skin 4 (four) times daily - after meals and at bedtime. ?Patient taking differently: Inject 5 Units into the skin 4 (four) times daily - after meals and at bedtime. 07/07/20   [name], MD  ?lidocaine (LIDODERM) 5 % Place 1 patch onto the skin See admin instructions. Remove & Discard patch within 12 hours or as directed by MD    [provider]  ?Melatonin 3 MG TBDP Take 6 mg by mouth at bedtime. 09/13/20   [provider]  ?oxybutynin (DITROPAN-XL) 5 MG 24 hr tablet Take 5 mg by mouth daily. 06/02/20   [provider]  ?predniSONE (DELTASONE) 5 MG tablet Take 10 mg by mouth daily with breakfast.    [provider]  ?rosuvastatin (CRESTOR) 20 MG tablet TAKE 1 TABLET BY MOUTH EVERY DAY 08/18/21   [name], [name], MD  ?tacrolimus (PROGRAF) 1 MG capsule Take 1 mg by mouth 2 (two) times daily. 10/31/19   [provider]  ?thiamine 100 MG tablet Take 100 mg by mouth daily. 09/11/20   [provider]  ? ? ?Physical Exam: ?BP (!) 118/57 (BP Location: Left Arm)   Pulse 83   Temp 98.3 ?F (36.8 ?C) (Oral)   Resp 18   Ht '5\' 9"'$  (1.753 m)   Wt 63.5 kg   SpO2 96%   BMI 20.67 kg/m?  ? ?General: 71 y.o. year-old male well developed well nourished in no acute distress.  Alert and oriented x3. ?Cardiovascular: Regular rate and rhythm with no rubs or gallops.  No thyromegaly or JVD noted.  Bilateral lower extremity edema, left greater than right.  Difficult to palpate dorsalis pedis pulses. ?Respiratory: Clear to auscultation with no  wheezes or rales. Good inspiratory effort. ?Abdomen: Soft nontender nondistended with normal bowel sounds x4 quadrants. ?Muskuloskeletal: No cyanosis, clubbing or edema noted bilaterally ?Neuro: CN II-XII intact, strength, sensation, reflexes ?Skin: Healing blisters from right lower extremity. ?Psychiatry: Judgement and insight appear normal. Mood is appropriate for condition and setting ?   ?   ?   ?Labs on Admission:  ?Basic Metabolic Panel: ?Recent Labs  ?Lab 09/08/21 ?2027  ?NA 136  ?K 4.4  ?CL 103  ?  CO2 25  ?GLUCOSE 95  ?BUN 52*  ?CREATININE 2.67*  ?CALCIUM 9.5  ? ?Liver Function Tests: ?Recent Labs  ?Lab 09/08/21 ?2027  ?AST 14*  ?ALT 15  ?ALKPHOS 50  ?BILITOT 0.5  ?PROT 6.6  ?ALBUMIN 3.7  ? ?No results for input(s): LIPASE, AMYLASE in the last 168 hours. ?No results for input(s): AMMONIA in the last 168 hours. ?CBC: ?Recent Labs  ?Lab 09/08/21 ?2027  ?WBC 10.7*  ?NEUTROABS 9.4*  ?HGB 12.6*  ?HCT 39.6  ?MCV 93.4  ?PLT 192  ? ?Cardiac Enzymes: ?No results for input(s): CKTOTAL, CKMB, CKMBINDEX, TROPONINI in the last 168 hours. ? ?BNP (last 3 results) ?No results for input(s): BNP in the last 8760 hours. ? ?ProBNP (last 3 results) ?No results for input(s): PROBNP in the last 8760 hours. ? ?CBG: ?Recent Labs  ?Lab 09/08/21 ?1929 09/09/21 ?0103 09/09/21 ?0226  ?GLUCAP 101* 69* 163*  ? ? ?Radiological Exams on Admission: ?No results found. ? ?EKG: I independently viewed the EKG done and my findings are as followed: None at the time of this visit. ? ?Assessment/Plan ?Present on Admission: ? Cellulitis ? ?Principal Problem: ?  Cellulitis ? ?Left lower extremity cellulitis, POA ?Obtain blood cultures, peripherally x2. ?Follow cultures for ID and sensitivities and to narrow down antibiotics. ?Continue Rocephin started in the ED. ?Obtain MRSA screening test. ?Monitor fever curve and WBC. ?Pain management and bowel regimen in place. ?Gentle IV fluid hydration LR at 50 cc/h x 1 day. ? ?Bilateral lower extremity  edema ?Obtain bilateral Doppler ultrasound to rule out DVT. ?Continue home Eliquis for history of pulmonary embolism ? ?Post renal transplant ?Follow[facility] at [facility] ?Resume home immunosuppression therapy ? ?Failure to thrive in a

## 2021-09-10 DIAGNOSIS — I10 Essential (primary) hypertension: Secondary | ICD-10-CM

## 2021-09-10 DIAGNOSIS — N184 Chronic kidney disease, stage 4 (severe): Secondary | ICD-10-CM

## 2021-09-10 DIAGNOSIS — Z794 Long term (current) use of insulin: Secondary | ICD-10-CM

## 2021-09-10 DIAGNOSIS — E1122 Type 2 diabetes mellitus with diabetic chronic kidney disease: Secondary | ICD-10-CM

## 2021-09-10 LAB — BASIC METABOLIC PANEL
Anion gap: 9 (ref 5–15)
BUN: 49 mg/dL — ABNORMAL HIGH (ref 8–23)
CO2: 21 mmol/L — ABNORMAL LOW (ref 22–32)
Calcium: 9.4 mg/dL (ref 8.9–10.3)
Chloride: 105 mmol/L (ref 98–111)
Creatinine, Ser: 2.45 mg/dL — ABNORMAL HIGH (ref 0.61–1.24)
GFR, Estimated: 27 mL/min — ABNORMAL LOW (ref >60)
Glucose, Bld: 118 mg/dL — ABNORMAL HIGH (ref 70–99)
Potassium: 4.9 mmol/L (ref 3.5–5.1)
Sodium: 135 mmol/L (ref 135–145)

## 2021-09-10 LAB — HEMOGLOBIN A1C
Hgb A1c MFr Bld: 9.3 % — ABNORMAL HIGH (ref 4.8–5.6)
Mean Plasma Glucose: 220.21 mg/dL

## 2021-09-10 LAB — GLUCOSE, CAPILLARY
Glucose-Capillary: 195 mg/dL — ABNORMAL HIGH (ref 70–99)
Glucose-Capillary: 115 mg/dL — ABNORMAL HIGH (ref 70–99)
Glucose-Capillary: 200 mg/dL — ABNORMAL HIGH (ref 70–99)
Glucose-Capillary: 404 mg/dL — ABNORMAL HIGH (ref 70–99)
Glucose-Capillary: 219 mg/dL — ABNORMAL HIGH (ref 70–99)

## 2021-09-10 LAB — CBC
HCT: 33 % — ABNORMAL LOW (ref 39.0–52.0)
Hemoglobin: 10.6 g/dL — ABNORMAL LOW (ref 13.0–17.0)
MCH: 29.6 pg (ref 26.0–34.0)
MCHC: 32.1 g/dL (ref 30.0–36.0)
MCV: 92.2 fL (ref 80.0–100.0)
Platelets: 143 10*3/uL — ABNORMAL LOW (ref 150–400)
RBC: 3.58 MIL/uL — ABNORMAL LOW (ref 4.22–5.81)
RDW: 13.4 % (ref 11.5–15.5)
WBC: 8.3 10*3/uL (ref 4.0–10.5)
nRBC: 0 % (ref 0.0–0.2)

## 2021-09-10 MED ORDER — AMLODIPINE BESYLATE 5 MG PO TABS
2.5000 mg | ORAL_TABLET | Freq: Every day | ORAL | Status: DC
Start: 2021-09-10 — End: 2021-09-11
  Administered 2021-09-10 – 2021-09-11 (×2): 2.5 mg via ORAL
  Filled 2021-09-10 (×2): qty 1

## 2021-09-10 MED ORDER — INSULIN GLARGINE-YFGN 100 UNIT/ML ~~LOC~~ SOLN
6.0000 [IU] | Freq: Every day | SUBCUTANEOUS | Status: DC
  Administered 2021-09-10: 6 [IU] via SUBCUTANEOUS
  Filled 2021-09-10 (×2): qty 0.06

## 2021-09-10 MED ORDER — CARVEDILOL 6.25 MG PO TABS
6.2500 mg | ORAL_TABLET | Freq: Two times a day (BID) | ORAL | Status: DC
  Administered 2021-09-10 – 2021-09-11 (×3): 6.25 mg via ORAL
  Filled 2021-09-10 (×3): qty 1

## 2021-09-10 MED ORDER — INSULIN ASPART 100 UNIT/ML IJ SOLN
15.0000 [IU] | Freq: Once | INTRAMUSCULAR | Status: AC
  Administered 2021-09-10: 15 [IU] via SUBCUTANEOUS

## 2021-09-10 NOTE — Evaluation (Signed)
Occupational Therapy Evaluation ?Patient Details ?Name: [name] [name] ?MRN: [id number] ?DOB: 1950-07-10 ?Today's Date: 09/10/2021 ? ? ?History of Present Illness 71 y/o male presented to ED on 09/08/21 for L leg warmth, swelling, and pain. Found to have LLE cellulitis. PMH: renal transplant on immunosuppression, hx of DVT and PE on Eliquis, HTN, peripheral neuropathy  ? ?Clinical Impression ?  ?Patient is a pleasant 71 year old male who was admitted for above. Patient's pain and decreased activity tolerance was preventing patient from completing LB dressing and ADLs at prior level of independence with no AD. Patient lives at home with wife prior level. Patient would continue to benefit from skilled OT services at this time while admitted to address noted deficits in order to improve overall safety and independence in ADLs.  ?  ?   ? ?Recommendations for follow up therapy are one component of a multi-disciplinary discharge planning process, led by the attending physician.  Recommendations may be updated based on patient status, additional functional criteria and insurance authorization.  ? ?Follow Up Recommendations ? No OT follow up  ?  ?Assistance Recommended at Discharge Frequent or constant Supervision/Assistance  ?Patient can return home with the following A little help with walking and/or transfers;A little help with bathing/dressing/bathroom;Assistance with cooking/housework;Assist for transportation;Help with stairs or ramp for entrance ? ?  ?Functional Status Assessment ? Patient has had a recent decline in their functional status and demonstrates the ability to make significant improvements in function in a reasonable and predictable amount of time.  ?Equipment Recommendations ? Other (comment) (reacher)  ?  ?Recommendations for Other Services   ? ? ?  ?Precautions / Restrictions Precautions ?Precautions: Fall ?Precaution Comments: fistula RUE ?Restrictions ?Weight Bearing Restrictions: No  ? ?  ? ?Mobility  Bed Mobility ?  ?  ?  ?  ?  ?  ?  ?General bed mobility comments: patient was up in recliner and returned to the same ?  ? ?Transfers ?  ?  ?  ?  ?  ?  ?  ?  ?  ?  ?  ? ?  ?Balance Overall balance assessment: Needs assistance ?Sitting-balance support: Feet supported ?Sitting balance-Leahy Scale: Normal ?  ?  ?Standing balance support: Bilateral upper extremity supported, Reliant on assistive device for balance ?Standing balance-Leahy Scale: Poor ?Standing balance comment: reliant on Rw due to pain ?  ?  ?  ?  ?  ?  ?  ?  ?  ?  ?  ?   ? ?ADL either performed or assessed with clinical judgement  ? ?ADL Overall ADL's : Needs assistance/impaired ?Eating/Feeding: Modified independent;Sitting ?  ?Grooming: Dance movement psychotherapist;Wash/dry hands;Min guard;Standing ?  ?Upper Body Bathing: Set up;Sitting ?  ?Lower Body Bathing: Minimal assistance;Sit to/from stand;Sitting/lateral leans ?  ?Upper Body Dressing : Set up;Sitting ?  ?Lower Body Dressing: Sit to/from stand;Sitting/lateral leans;Moderate assistance ?Lower Body Dressing Details (indicate cue type and reason): patient needed mod A to don socks sitting in recliner with LLE giving most difficulty. ?Toilet Transfer: Min guard;Rolling walker (2 wheels);Ambulation ?Toilet Transfer Details (indicate cue type and reason): with min A for sit to stand from recliner and from toilet in bathroom with education on proper hand placement. ?Toileting- Water quality scientist and Hygiene: Min guard;Sit to/from stand ?  ?  ?  ?Functional mobility during ADLs: Min guard;Rolling walker (2 wheels) ?   ? ? ? ?Vision Patient Visual Report: No change from baseline ?   ?   ?Perception   ?  ?  Praxis   ?  ? ?Pertinent Vitals/Pain Pain Assessment ?Pain Assessment: 0-10 ?Pain Score: 5  ?Pain Location: L thigh ?Pain Descriptors / Indicators: Sore ?Pain Intervention(s): Monitored during session  ? ? ? ?Hand Dominance Right ?  ?Extremity/Trunk Assessment Upper Extremity Assessment ?Upper Extremity Assessment:  Overall WFL for tasks assessed ?  ?Lower Extremity Assessment ?Lower Extremity Assessment: Defer to PT evaluation (LLE noted to have increased size compared to RLE) ?  ?Cervical / Trunk Assessment ?Cervical / Trunk Assessment: Normal ?  ?Communication Communication ?Communication: No difficulties ?  ?Cognition Arousal/Alertness: Awake/alert ?Behavior During Therapy: [facility] for tasks assessed/performed ?Overall Cognitive Status: Within Functional Limits for tasks assessed ?  ?  ?  ?  ?  ?  ?  ?  ?  ?  ?  ?  ?  ?  ?  ?  ?General Comments: plesant and cooperative. recenlty retired ?  ?  ?General Comments    ? ?  ?Exercises   ?  ?Shoulder Instructions    ? ? ?Home Living Family/patient expects to be discharged to:: Private residence ?Living Arrangements: Spouse/significant other;Children ?Available Help at Discharge: Family;Available 24 hours/day (grandchildren live with them) ?Type of Home: House ?Home Access: Stairs to enter ?Entrance Stairs-Number of Steps: 2 ?Entrance Stairs-Rails: Right;Left ?Home Layout: One level ?  ?  ?Bathroom Shower/Tub: Tub/shower unit ?  ?Bathroom Toilet: Standard ?  ?  ?Home Equipment: Conservation officer, nature (2 wheels);Rollator (4 wheels);Cane - single point;Tub bench ?  ?  ?  ? ?  ?Prior Functioning/Environment Prior Level of Function : Independent/Modified Independent ?  ?  ?  ?  ?  ?  ?Mobility Comments: uses rollator or RW in community ?  ?  ? ?  ?  ?OT Problem List: Decreased activity tolerance;Impaired balance (sitting and/or standing);Decreased safety awareness;Pain ?  ?   ?OT Treatment/Interventions: Self-care/ADL training;Therapeutic exercise;DME and/or AE instruction;Patient/family education;Balance training  ?  ?OT Goals(Current goals can be found in the care plan section) Acute Rehab OT Goals ?Patient Stated Goal: to get home tomorrow ?OT Goal Formulation: With patient ?Time For Goal Achievement: 09/24/21 ?Potential to Achieve Goals: Good  ?OT Frequency: Min 2X/week ?  ? ?Co-evaluation   ?   ?  ?  ?  ? ?  ?AM-PAC OT "6 Clicks" Daily Activity     ?Outcome Measure Help from another person eating meals?: None ?Help from another person taking care of personal grooming?: A Little ?Help from another person toileting, which includes using toliet, bedpan, or urinal?: A Little ?Help from another person bathing (including washing, rinsing, drying)?: A Lot ?Help from another person to put on and taking off regular upper body clothing?: A Little ?Help from another person to put on and taking off regular lower body clothing?: A Lot ?6 Click Score: 17 ?  ?End of Session Equipment Utilized During Treatment: Gait belt;Rolling walker (2 wheels) ?Nurse Communication: Mobility status ? ?Activity Tolerance: Patient tolerated treatment well ?Patient left: in chair;with call bell/phone within reach;with chair alarm set ? ?OT Visit Diagnosis: Unsteadiness on feet (R26.81);Pain  ?              ?Time: 3762-8315 ?OT Time Calculation (min): 23 min ?Charges:  OT General Charges ?$OT Visit: 1 Visit ?OT Evaluation ?$OT Eval Low Complexity: 1 Low ?OT Treatments ?$Self Care/Home Management : 8-22 mins ? ?[name] [name] OTR/L, MS ?Acute Rehabilitation Department ?Office# [phone] ?Pager# [phone] ? ? ?Feliz Beam Tinsley Lomas ?09/10/2021, 1:29 PM ?

## 2021-09-10 NOTE — TOC Initial Note (Signed)
Transition of Care (TOC) - Initial/Assessment Note  ? ? ?Patient Details  ?Name: [name] [name] ?MRN: [id number] ?Date of Birth: 10/26/1950 ? ?Transition of Care (TOC) CM/SW Contact:    ?Tom-Johnson, Renea Ee, RN ?Phone Number: ?09/10/2021, 3:10 PM ? ?Clinical Narrative:                 ? ?CM spoke with patient at beside about needs for post hospital transition. Admitted for LLE Cellulitis and RLE blister. Currently on IV abx.  ?From home with wife, daughter and grand children. Retired [employer] at [employer]. Has a cane, walker, rollator and shower seat at home.  ?PCP is [name], [name], MD [facility].  ?No PT/OT recommendations noted. Family to transport at discharge.  ?CM will continue to follow with needs.  ? ?Expected Discharge Plan: Home/Self Care ? ?Patient Goals and CMS Choice ?Patient states their goals for this hospitalization and ongoing recovery are:: To return home ?CMS Medicare.gov Compare Post Acute Care list provided to:: Patient ?Choice offered to / list presented to : NA ? ?Expected Discharge Plan and Services ?Expected Discharge Plan: Home/Self Care ?  ?Discharge Planning Services: CM Consult ?Post Acute Care Choice: NA ?Living arrangements for the past 2 months: [place] ?                ?DME Arranged: N/A ?DME Agency: NA ?  ?  ?  ?HH Arranged: NA ?[place] Agency: NA ?  ?  ?  ? ?Prior Living Arrangements/Services ?Living arrangements for the past 2 months: [place] ?Lives with:: Spouse ?Patient language and need for interpreter reviewed:: Yes ?Do you feel safe going back to the place where you live?: Yes      ?Need for Family Participation in Patient Care: Yes (Comment) ?Care giver support system in place?: Yes (comment) ?Current home services: DME (Cane, rollator, rolling walker and shower seat.) ?Criminal Activity/Legal Involvement Pertinent to Current Situation/Hospitalization: No - Comment as  needed ? ?Activities of Daily Living ?Home Assistive Devices/Equipment: Gilford Rile (specify type) ?ADL Screening (condition at time of admission) ?Patient's cognitive ability adequate to safely complete daily activities?: Yes ?Is the patient deaf or have difficulty hearing?: No ?Does the patient have difficulty seeing, even when wearing glasses/contacts?: Yes ?Does the patient have difficulty concentrating, remembering, or making decisions?: No ?Patient able to express need for assistance with ADLs?: Yes ?Does the patient have difficulty dressing or bathing?: No ?Independently performs ADLs?: Yes (appropriate for developmental age) ?Does the patient have difficulty walking or climbing stairs?: Yes ?Weakness of Legs: Both ?Weakness of Arms/Hands: None ? ?Permission Sought/Granted ?Permission sought to share information with : Case Manager, Family Supports ?Permission granted to share information with : Yes, Verbal Permission Granted ?   ?   ?   ?   ? ?Emotional Assessment ?Appearance:: Appears stated age ?Attitude/Demeanor/Rapport: Engaged, Gracious ?Affect (typically observed): Accepting, Appropriate, Calm, Hopeful ?Orientation: : Oriented to Self, Oriented to Place, Oriented to  Time, Oriented to Situation ?Alcohol / Substance Use: Not Applicable ?Psych Involvement: No (comment) ? ?Admission diagnosis:  Cellulitis [L03.90] ?Cellulitis of left lower extremity [L03.116] ?History of DVT (deep vein thrombosis) [Z86.718] ?Pain and swelling of left lower leg [M79.662, M79.89] ?Patient Active Problem List  ? Diagnosis Date Noted  ? Cellulitis 09/09/2021  ? Plantar fasciitis of left foot 03/05/2021  ? AKI (acute kidney injury) ([place]) 10/25/2020  ? History of ischemic stroke 10/24/2020  ? Hyperglycemia 10/24/2020  ?  Cerebral thrombosis with cerebral infarction 09/27/2020  ? Pulmonary embolism ([place]) 09/25/2020  ? 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  ? Oropharyngeal dysphagia 07/11/2020  ? 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 Headache 07/10/2020  ? Perianal lesion 07/10/2020  ? Physical deconditioning 07/10/2020  ? 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 Weight loss 07/10/2020  ? Generalized weakness 07/08/2020  ? AMS (altered mental status) 07/06/2020  ? Overactive bladder 03/08/2020  ? Fever 01/17/2020  ? Erectile dysfunction after radical prostatectomy 11/23/2019  ? Anaphylactic shock, unspecified, initial encounter 10/17/2019  ? Peripheral vascular disease ([place]) 10/17/2019  ? Pain, unspecified 10/14/2019  ? Pruritus, unspecified 10/14/2019  ? Shortness of breath 10/14/2019  ? Type 2 diabetes mellitus with diabetic neuropathy, unspecified ([place]) 10/14/2019  ? Acute UTI 10/13/2019  ? Delayed graft function of kidney 10/12/2019  ? Postoperative fever 10/11/2019  ? Immunosuppressed status ([place]) 10/09/2019  ? Kidney transplanted 10/09/2019  ? Prophylactic antibiotic 10/09/2019  ? Status post placement of ureteral stent 10/09/2019  ? Steroid-induced hyperglycemia 10/09/2019  ? Allergy, unspecified, initial encounter 07/25/2019  ? Hematemesis 06/14/2019  ? COVID-19 virus infection 06/10/2019  ? Fall at home, initial encounter 06/10/2019  ? Syncope 06/10/2019  ? Elevated troponin 06/10/2019  ? Dependence on renal dialysis ([place]) 05/26/2018  ? Cyst of kidney, acquired 11/29/2017  ? Other long term (current) drug therapy 11/24/2017  ? Unspecified jaundice 11/24/2017  ? Elevated prostate specific antigen (PSA) 02/12/2017  ? ESRD on peritoneal dialysis ([place]) 02/12/2017  ? 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 Secondary hyperparathyroidism ([place]) 02/12/2017  ? Malignant neoplasm of prostate ([place]) 02/08/2017  ? Moderate protein-calorie malnutrition ([place]) 10/16/2016  ? Iron deficiency anemia, unspecified 10/05/2016  ? Hypokalemia 09/29/2016  ? Anemia in chronic kidney disease 09/29/2016  ? Coagulation defect, unspecified ([place]) 09/29/2016  ? End stage renal disease ([place])  09/29/2016  ? Localized edema 09/29/2016  ? Systemic inflammatory response syndrome (sirs) of non-infectious origin without acute organ dysfunction ([place]) 09/29/2016  ? Pure hypercholesterolemia, unspecified 09/29/2016  ? Preop cardiovascular exam 04/27/2016  ? Diabetes mellitus type 2, controlled ([place]) 06/21/2015  ? Other hyperlipidemia 12/11/2014  ? Essential hypertension 09/10/2014  ? SIRS (systemic inflammatory response syndrome) ([place]) 08/01/2014  ? Bronchitis   ? Sepsis ([place]) 07/31/2014  ? Syncope due to orthostatic hypotension 07/28/2012  ? CKD (chronic kidney disease), stage IV ([place]) 07/28/2012  ? Acute kidney failure ([place]) 07/28/2012  ? Benign prostatic hyperplasia without lower urinary tract symptoms 06/25/2011  ? Hereditary and idiopathic peripheral neuropathy 07/20/2007  ? ?PCP:  [name], [name], MD ?Pharmacy:   ?[place], [place] ?[address] ?[place] [address] ?Phone: [phone] Fax: [phone] ? ?[id number] [place] [place] - [id number]?Phone: [phone]: [phone]? ? ? ? ?Social Determinants of Health (SDOH) Interventions ?  ? ?Readmission Risk Interventions ? ?  09/27/2020  ? 12:51 PM 09/27/2020  ? 11:14 AM  ?Readmission Risk Prevention Plan  ?Transportation Screening  Complete  ?PCP or Specialist appointment within 3-5 days of discharge Complete   ?[place] Consult  Complete  ?SW Recovery Care/Counseling Consult  Complete  ?Palliative Care Screening  Not Applicable  ?SOna Not Applicable  ? ? ? ?

## 2021-09-10 NOTE — Progress Notes (Addendum)
?PROGRESS NOTE ? ? ? ?RAFEL [name]  FAO:[id number] DOB: [birth year] DOA: 09/08/2021 ?PCP: [name], [name], MD  ? ?Chief Complaint  ?Patient presents with  ? Leg Pain  ? ? ?Brief Narrative:  ?[name] [name] is a 71 y.o. male with medical history significant for renal transplant on immunosuppression therapy, PE on Eliquis, HLD, failure to thrive in an adult who presented to [facility] ED from home due to left leg pain up to his left thigh.  Associated with edema, warmth and erythema with concern for cellulitis.  He noticed the pain and the erythema on 09/07/21.  His symptoms gradually worsened.  No subjective fevers or chills.  Due to concern for cellulitis in an immunocompromised state, EDP started empiric IV antibiotics and requested admission for further IV antibiotic treatment given he is immunocompromised. ?  ? ? ?Assessment & Plan: ? ?Left lower extremity cellulitis ?Patient was started on ceftriaxone.  WBC noted to be better today.  He is noted to be afebrile.  Follow-up on cultures.  He does feel better from a symptom standpoint.  Start mobilizing.    ? ?CKD 4/post renal transplantation ?Appears to be at his baseline creatinine of 2.6 with GFR of 25 ?Avoid nephrotoxic agents, dehydration and hypotension. ?Follows at [facility]. ?Monitor urine output. ?Noted to be on prednisone, Prograf, fluconazole. ?Apparently not on atovaquone and acyclovir anymore. ?  ?Failure to thrive in adult ?Physical debility ?Likely secondary to cellulitis on his chronic comorbidities.  PT and OT evaluation.. ?  ?Hyperlipidemia ?Continue Crestor ?  ?History of pulmonary embolism ?Continue Eliquis  ? 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  He was hypoglycemic during earlier part of this admission.  Currently on just SSI.  Home medication list reviewed.  Apparently on Lantus insulin 6 units at bedtime at home.  Currently on hold. ? ?Essential  hypertension ?Amlodipine currently on hold.  Also on carvedilol and Lasix which is also on hold. ?Hypertensive readings noted yesterday.  We will go ahead and resume the amlodipine and carvedilol. ? ?Anemia of chronic kidney disease stage 4 ?Hemoglobin stable.  No evidence of overt bleeding. ? ?DVT prophylaxis: on ELiquis ?Code Status: Full ?Family Communication: None at bedside ?Disposition: Home but not medically ready ? ? ?  ?Consultants:  ?None ? ? ?Subjective: ? ?Patient mentions that his left leg is feeling better.  Less painful.  Denies any other complaint such as nausea vomiting shortness of breath.  Urinating well ? ?Objective: ?Vitals:  ? 09/09/21 2245 09/09/21 2311 09/10/21 0303 09/10/21 0919  ?BP: 139/66 134/83 136/60 140/68  ?Pulse:  86 76 78  ?Resp: '14 18 18 18  '$ ?Temp:  97.9 ?F (36.6 ?C) 98.4 ?F (36.9 ?C) 98 ?F (36.7 ?C)  ?TempSrc:  Oral Oral Oral  ?SpO2: 100% 98% 100% 100%  ?Weight:  64.4 kg    ?Height:      ? ? ?Intake/Output Summary (Last 24 hours) at 09/10/2021 1053 ?Last data filed at 09/10/2021 [phone] ?Gross per 24 hour  ?Intake 553.33 ml  ?Output 830 ml  ?Net -276.67 ml  ? ?Filed Weights  ? 09/08/21 1958 09/09/21 2311  ?Weight: 63.5 kg 64.4 kg  ? ? ?Examination: ? ?General appearance: Awake alert.  In no distress ?Resp: Clear to auscultation bilaterally.  Normal effort ?Cardio: S1-S2 is normal regular.  No S3-S4.  No rubs murmurs or bruit ?GI: Abdomen is soft.  Nontender nondistended.  Bowel sounds are present normal.  No  masses organomegaly ?Extremities: Swelling of the left lower extremity noted with mild tenderness.  Good pulses.  Blister noted in the right lower leg. ?Neurologic: Alert and oriented x3.  No focal neurological deficits.  ? ? ? ? ? ?Data Reviewed: I have personally reviewed following labs and imaging studies ? ?CBC: ?Recent Labs  ?Lab 09/08/21 ?2027 09/09/21 ?7425 09/10/21 ?9563  ?WBC 10.7* 10.9* 8.3  ?NEUTROABS 9.4* 9.5*  --   ?HGB 12.6* 11.5* 10.6*  ?HCT 39.6 37.0* 33.0*  ?MCV  93.4 94.1 92.2  ?PLT 192 160 143*  ? ? ? ?Basic Metabolic Panel: ?Recent Labs  ?Lab 09/08/21 ?2027 09/09/21 ?8756 09/10/21 ?4332  ?NA 136 132* 135  ?K 4.4 4.3 4.9  ?CL 103 101 105  ?CO2 25 22 21*  ?GLUCOSE 95 211* 118*  ?BUN 52* 55* 49*  ?CREATININE 2.67* 2.69* 2.45*  ?CALCIUM 9.5 8.8* 9.4  ?MG  --  1.8  --   ?PHOS  --  3.6  --   ? ? ? ?GFR: ?Estimated Creatinine Clearance: 24.8 mL/min (A) (by C-G formula based on SCr of 2.45 mg/dL (H)). ? ?Liver Function Tests: ?Recent Labs  ?Lab 09/08/21 ?2027 09/09/21 ?9518  ?AST 14* 15  ?ALT 15 13  ?ALKPHOS 50 44  ?BILITOT 0.5 0.5  ?PROT 6.6 5.6*  ?ALBUMIN 3.7 3.2*  ? ? ? ?CBG: ?Recent Labs  ?Lab 09/09/21 ?0226 09/09/21 ?1748 09/09/21 ?2212 09/09/21 ?2312 09/10/21 ?8416  ?GLUCAP 163* 333* 228* 195* 115*  ? ? ? ? ?Recent Results (from the past 240 hour(s))  ?MRSA Next Gen by PCR, Nasal     Status: None  ? Collection Time: 09/09/21  4:46 AM  ? Specimen: Nasal Mucosa; Nasal Swab  ?Result Value Ref Range Status  ? MRSA by PCR Next Gen NOT DETECTED NOT DETECTED Final  ?  Comment: (NOTE) ?The GeneXpert MRSA Assay (FDA approved for NASAL specimens only), ?is one component of a comprehensive MRSA colonization surveillance ?program. It is not intended to diagnose MRSA infection nor to guide ?or monitor treatment for MRSA infections. ?Test performance is not FDA approved in patients less than 2 years ?old. ?Performed at [facility], [place] [address] [place], [place] ?[address] ?  ?Culture, blood (Routine X 2) w Reflex to ID Panel     Status: None (Preliminary result)  ? Collection Time: 09/09/21  5:15 AM  ? Specimen: BLOOD  ?Result Value Ref Range Status  ? Specimen Description BLOOD SITE NOT SPECIFIED  Final  ? Special Requests   Final  ?  BOTTLES DRAWN AEROBIC AND ANAEROBIC Blood Culture adequate volume  ? Culture   Final  ?  NO GROWTH 1 DAY ?Performed at [facility], [place] [address] [place], [place] [address] ?  ? Report Status PENDING  Incomplete  ?Culture, blood (Routine  X 2) w Reflex to ID Panel     Status: None (Preliminary result)  ? Collection Time: 09/09/21  5:25 AM  ? Specimen: BLOOD  ?Result Value Ref Range Status  ? Specimen Description BLOOD SITE NOT SPECIFIED  Final  ? Special Requests   Final  ?  BOTTLES DRAWN AEROBIC AND ANAEROBIC Blood Culture adequate volume  ? Culture   Final  ?  NO GROWTH 1 DAY ?Performed at [facility], [place] [address], [place], [place] [address] ?  ? Report Status PENDING  Incomplete  ? ?  ? ? ? ? ? ?Radiology Studies: ?No results found. ? ? ?Scheduled Meds: ?  apixaban  5 mg Oral BID  ? feeding supplement  237 mL Oral BID BM  ? fluconazole  400 mg Oral Daily  ? folic acid  1 mg Oral Daily  ? insulin aspart  0-5 Units Subcutaneous QHS  ? insulin aspart  0-9 Units Subcutaneous TID WC  ? melatonin  6 mg Oral QHS  ? oxybutynin  5 mg Oral Daily  ? predniSONE  10 mg Oral Q breakfast  ? rosuvastatin  20 mg Oral Daily  ? tacrolimus  1 mg Oral BID  ? thiamine  100 mg Oral Daily  ? ?Continuous Infusions: ? cefTRIAXone (ROCEPHIN)  IV Stopped (09/09/21 1839)  ? ? ? LOS: 1 day  ? ? ? ?Bonnielee Haff, MD ?[facility] ? ? ?09/10/2021, 10:53 AM  ?  ?

## 2021-09-10 NOTE — Evaluation (Signed)
Physical Therapy Evaluation ?Patient Details ?Name: [name] [name] ?MRN: [id number] ?DOB: [birth year] ?Today's Date: 09/10/2021 ? ?History of Present Illness ? 71 y/o male presented to ED on 09/08/21 for L leg warmth, swelling, and pain. Found to have LLE cellulitis. PMH: renal transplant on immunosuppression, hx of DVT and PE on Eliquis, HTN, peripheral neuropathy  ?Clinical Impression ? Patient admitted with the above findings. Patient presents with weakness, impaired balance, and decreased activity tolerance. Patient at supervision level for mobility with use of RW due to pain in L LE. Encouraged continued mobility with nursing staff to assist with pain management as he reports improved pain at end of session. Patient will benefit from skilled PT services during acute stay to address listed deficits. No PT follow up recommended at this time.    ?   ? ?Recommendations for follow up therapy are one component of a multi-disciplinary discharge planning process, led by the attending physician.  Recommendations may be updated based on patient status, additional functional criteria and insurance authorization. ? ?Follow Up Recommendations No PT follow up ? ?  ?Assistance Recommended at Discharge Intermittent Supervision/Assistance  ?Patient can return home with the following ?   ? ?  ?Equipment Recommendations None recommended by PT  ?Recommendations for Other Services ?    ?  ?Functional Status Assessment Patient has had a recent decline in their functional status and demonstrates the ability to make significant improvements in function in a reasonable and predictable amount of time.  ? ?  ?Precautions / Restrictions Precautions ?Precautions: Fall ?Restrictions ?Weight Bearing Restrictions: No  ? ?  ? ?Mobility ? Bed Mobility ?Overal bed mobility: Modified Independent ?  ?  ?  ?  ?  ?  ?  ?  ? ?Transfers ?Overall transfer level: Needs assistance ?Equipment used: Rolling Brunette Lavalle (2 wheels) ?Transfers: Sit to/from  Stand ?Sit to Stand: Supervision ?  ?  ?  ?  ?  ?  ?  ? ?Ambulation/Gait ?Ambulation/Gait assistance: Supervision ?Gait Distance (Feet): 250 Feet ?Assistive device: Rolling Ector Laurel (2 wheels) ?Gait Pattern/deviations: Decreased stance time - left, Decreased stride length, Step-through pattern ?Gait velocity: decreased ?  ?  ?General Gait Details: supervision for safety. Decreased stance time on L due to pain. Patient stating L LE feeling better at end of session ? ?Stairs ?Stairs: Yes ?Stairs assistance: Supervision ?Stair Management: Two rails, Step to pattern, Forwards ?Number of Stairs: 2 ?General stair comments: instructed on up with good and down with bad with good follow through. ? ?Wheelchair Mobility ?  ? ?Modified Rankin (Stroke Patients Only) ?  ? ?  ? ?Balance Overall balance assessment: Needs assistance ?Sitting-balance support: Feet supported ?Sitting balance-Leahy Scale: Normal ?  ?  ?Standing balance support: Bilateral upper extremity supported, Reliant on assistive device for balance ?Standing balance-Leahy Scale: Poor ?Standing balance comment: reliant on Rw due to pain ?  ?  ?  ?  ?  ?  ?  ?  ?  ?  ?  ?   ? ? ? ?Pertinent Vitals/Pain Pain Assessment ?Pain Assessment: 0-10 ?Pain Score: 5  ?Pain Location: L thigh ?Pain Descriptors / Indicators: Sore ?Pain Intervention(s): Monitored during session  ? ? ?Home Living Family/patient expects to be discharged to:: Private residence ?Living Arrangements: Spouse/significant other;Children ?Available Help at Discharge: Family;Available 24 hours/day ?Type of Home: House ?Home Access: Stairs to enter ?Entrance Stairs-Rails: Right;Left ?Entrance Stairs-Number of Steps: 2 ?  ?Home Layout: One level ?Home Equipment: Conservation officer, nature (2 wheels);Rollator (4 wheels);Cane -  single point ?   ?  ?Prior Function Prior Level of Function : Independent/Modified Independent ?  ?  ?  ?  ?  ?  ?Mobility Comments: uses rollator or RW in community ?  ?  ? ? ?Hand Dominance  ?  Dominant Hand: Right ? ?  ?Extremity/Trunk Assessment  ? Upper Extremity Assessment ?Upper Extremity Assessment: Overall WFL for tasks assessed ?  ? ?Lower Extremity Assessment ?Lower Extremity Assessment: Generalized weakness (L LE weakness due to pain) ?  ? ?Cervical / Trunk Assessment ?Cervical / Trunk Assessment: Normal  ?Communication  ? Communication: No difficulties  ?Cognition Arousal/Alertness: Awake/alert ?Behavior During Therapy: [facility] for tasks assessed/performed ?Overall Cognitive Status: Within Functional Limits for tasks assessed ?  ?  ?  ?  ?  ?  ?  ?  ?  ?  ?  ?  ?  ?  ?  ?  ?  ?  ?  ? ?  ?General Comments   ? ?  ?Exercises    ? ?Assessment/Plan  ?  ?PT Assessment Patient needs continued PT services  ?PT Problem List Decreased strength;Decreased activity tolerance;Decreased balance;Decreased mobility;Pain ? ?   ?  ?PT Treatment Interventions DME instruction;Gait training;Functional mobility training;Therapeutic activities;Balance training;Stair training;Therapeutic exercise;Patient/family education   ? ?PT Goals (Current goals can be found in the Care Plan section)  ?Acute Rehab PT Goals ?Patient Stated Goal: to go home tomorrow ?PT Goal Formulation: With patient ?Time For Goal Achievement: 09/24/21 ?Potential to Achieve Goals: Good ? ?  ?Frequency Min 3X/week ?  ? ? ?Co-evaluation   ?  ?  ?  ?  ? ? ?  ?AM-PAC PT "6 Clicks" Mobility  ?Outcome Measure Help needed turning from your back to your side while in a flat bed without using bedrails?: None ?Help needed moving from lying on your back to sitting on the side of a flat bed without using bedrails?: None ?Help needed moving to and from a bed to a chair (including a wheelchair)?: A Little ?Help needed standing up from a chair using your arms (e.g., wheelchair or bedside chair)?: A Little ?Help needed to walk in hospital room?: A Little ?Help needed climbing 3-5 steps with a railing? : A Little ?6 Click Score: 20 ? ?  ?End of Session Equipment Utilized  During Treatment: Gait belt ?Activity Tolerance: Patient tolerated treatment well ?Patient left: in chair;with call bell/phone within reach;with chair alarm set ?Nurse Communication: Mobility status ?PT Visit Diagnosis: Unsteadiness on feet (R26.81);Muscle weakness (generalized) (M62.81) ?  ? ?Time: 4628-6381 ?PT Time Calculation (min) (ACUTE ONLY): 31 min ? ? ?Charges:   PT Evaluation ?$PT Eval Low Complexity: 1 Low ?PT Treatments ?$Gait Training: 8-22 mins ?  ?   ? ? ?Cassundra Mckeever A. Gilford Rile, PT, DPT ?Acute Rehabilitation Services ?Pager [phone] ?Office [phone] ? ? ?Cathi Hazan A Jon Lall ?09/10/2021, 12:47 PM ? ?

## 2021-09-10 NOTE — Consult Note (Signed)
[facility] Nurse Consult Note: ?Reason for Consult: RLE, admitted for LLE cellulitis  ?Wound type: intact serous filed blister; history of DVT ?Pressure Injury POA: NA ?Measurement: see nursing flow sheets ?Wound bed: intact serous filled blister ?Drainage (amount, consistency, odor) none ?Periwound: intact  ?Dressing procedure/placement/frequency: ?Add single layer of xeroform per skin care order set, top with dry dressing. Change every other day.  ? ?Discussed POC with bedside nurse.  ?Re consult if needed, will not follow at this time. ?Thanks ? Ilah Boule [facility] MSN, RN,CWOCN, CNS, CWON-AP [phone])  ? ? ? ?  ?

## 2021-09-10 NOTE — Progress Notes (Signed)
New Admission Note:  ? ?Arrival Method: via stretcher from ED ?Mental Orientation: alert and oriented x4 ?Telemetry: 5M15, CCMD notified ?Assessment: completed ?Skin: dry and flaky,  blister right Lower Leg ?IV: RAC infusing with LR 50cc/hr ?Pain: 0/10 ?Tubes: None ?Safety Measures: Safety Fall Prevention Plan has been discussed  ?Admission: completed ?[facility] [place] Orientation: Patient has been oriented to the room, unit and staff.   ?Family: none at bedside ? ?Orders to be reviewed and implemented. Will continue to monitor the patient. Call light has been placed within reach and bed alarm has been activated.  ? ?

## 2021-09-11 LAB — CBC
HCT: 36 % — ABNORMAL LOW (ref 39.0–52.0)
Hemoglobin: 11.3 g/dL — ABNORMAL LOW (ref 13.0–17.0)
MCH: 28.8 pg (ref 26.0–34.0)
MCHC: 31.4 g/dL (ref 30.0–36.0)
MCV: 91.8 fL (ref 80.0–100.0)
Platelets: 160 10*3/uL (ref 150–400)
RBC: 3.92 MIL/uL — ABNORMAL LOW (ref 4.22–5.81)
RDW: 13.3 % (ref 11.5–15.5)
WBC: 9.9 10*3/uL (ref 4.0–10.5)
nRBC: 0 % (ref 0.0–0.2)

## 2021-09-11 LAB — BASIC METABOLIC PANEL
Anion gap: 11 (ref 5–15)
Anion gap: 8 (ref 5–15)
BUN: 53 mg/dL — ABNORMAL HIGH (ref 8–23)
BUN: 48 mg/dL — ABNORMAL HIGH (ref 8–23)
CO2: 17 mmol/L — ABNORMAL LOW (ref 22–32)
CO2: 22 mmol/L (ref 22–32)
Calcium: 9.2 mg/dL (ref 8.9–10.3)
Calcium: 9.4 mg/dL (ref 8.9–10.3)
Chloride: 105 mmol/L (ref 98–111)
Chloride: 103 mmol/L (ref 98–111)
Creatinine, Ser: 2.88 mg/dL — ABNORMAL HIGH (ref 0.61–1.24)
Creatinine, Ser: 2.58 mg/dL — ABNORMAL HIGH (ref 0.61–1.24)
GFR, Estimated: 22 mL/min — ABNORMAL LOW (ref >60)
GFR, Estimated: 26 mL/min — ABNORMAL LOW (ref >60)
Glucose, Bld: 226 mg/dL — ABNORMAL HIGH (ref 70–99)
Glucose, Bld: 205 mg/dL — ABNORMAL HIGH (ref 70–99)
Potassium: 6 mmol/L — ABNORMAL HIGH (ref 3.5–5.1)
Potassium: 4.8 mmol/L (ref 3.5–5.1)
Sodium: 133 mmol/L — ABNORMAL LOW (ref 135–145)
Sodium: 133 mmol/L — ABNORMAL LOW (ref 135–145)

## 2021-09-11 LAB — GLUCOSE, CAPILLARY
Glucose-Capillary: 242 mg/dL — ABNORMAL HIGH (ref 70–99)
Glucose-Capillary: 273 mg/dL — ABNORMAL HIGH (ref 70–99)

## 2021-09-11 MED ORDER — CEPHALEXIN 250 MG PO CAPS: 250.0000 mg | ORAL_CAPSULE | Freq: Three times a day (TID) | ORAL | 0 refills | Status: AC

## 2021-09-11 MED ORDER — OXYCODONE HCL 5 MG PO TABS: 5.0000 mg | ORAL_TABLET | Freq: Four times a day (QID) | ORAL | 0 refills | Status: DC | PRN

## 2021-09-11 MED ORDER — DOXYCYCLINE HYCLATE 100 MG PO TABS: 100.0000 mg | ORAL_TABLET | Freq: Two times a day (BID) | ORAL | 0 refills | Status: AC

## 2021-09-11 MED ORDER — CEPHALEXIN 250 MG PO CAPS
250.0000 mg | ORAL_CAPSULE | Freq: Three times a day (TID) | ORAL | Status: DC
  Administered 2021-09-11: 250 mg via ORAL
  Filled 2021-09-11 (×2): qty 1

## 2021-09-11 MED ORDER — DOXYCYCLINE HYCLATE 100 MG PO TABS
100.0000 mg | ORAL_TABLET | Freq: Two times a day (BID) | ORAL | Status: DC
  Administered 2021-09-11: 100 mg via ORAL
  Filled 2021-09-11: qty 1

## 2021-09-11 NOTE — Progress Notes (Addendum)
Inpatient Diabetes Program Recommendations ? ?AACE/ADA: New Consensus Statement on Inpatient Glycemic Control (2015) ? ?Target Ranges:  Prepandial:   less than 140 mg/dL ?     Peak postprandial:   less than 180 mg/dL (1-2 hours) ?     Critically ill patients:  140 - 180 mg/dL  ? ?Lab Results  ?Component Value Date  ? GLUCAP 242 (H) 09/11/2021  ? HGBA1C 9.3 (H) 09/10/2021  ? ? ?Review of Glycemic Control ? Latest Reference Range & Units 09/10/21 20:54 09/11/21 07:31  ?Glucose-Capillary 70 - 99 mg/dL 219 (H) 242 (H)  ? ?Diabetes history: DM 2 ?Outpatient Diabetes medications:  ?Novolog 5 units tid with meals ?Lantus 6 units q HS ?Current orders for Inpatient glycemic control:  ?Novolog sensitive tid with meals and HS ?Semglee 6 units q HS ?Prednisone 10 mg q AM ?Inpatient Diabetes Program Recommendations:   ? ?Consider adding Novolog meal coverage 3 units tid with meals (hold if patient eats less than 50% or NPO).  ? ?Thanks,  ?Adah Perl, RN, BC-ADM ?Inpatient Diabetes Coordinator ?Pager [phone]  (8a-5p) ? ? ?

## 2021-09-11 NOTE — Discharge Summary (Signed)
?[facility] ? ?Physician Discharge Summary  ? ?Patient ID: ?[name] [name] ?MRN: [id number] ?DOB/AGE: [birth year] 71 y.o. ? ?Admit date: 09/08/2021 ?Discharge date: 09/11/2021   ? ?PCP: [name] [name], [name] Halsted, MD ? ?DISCHARGE DIAGNOSES:  ?Lower extremity cellulitis ?Chronic kidney disease stage IV ?Status post renal transplant ?Hyperlipidemia ?History of pulmonary embolism ?Diabetes mellitus type 2 with renal complications ?Essential hypertension ?Anemia of chronic kidney disease ? ?RECOMMENDATIONS FOR OUTPATIENT FOLLOW UP: ?Follow-up with PCP in 1 week ? ? ?Home Health: None ?Equipment/Devices: None ? ?CODE STATUS: Full code ? ?DISCHARGE CONDITION: fair ? ?Diet recommendation: As before ? ?INITIAL HISTORY: ?[name] [name] is a 71 y.o. male with medical history significant for renal transplant on immunosuppression therapy, PE on Eliquis, HLD, failure to thrive in an adult who presented to [facility] ED from home due to left leg pain up to his left thigh.  Associated with edema, warmth and erythema with concern for cellulitis.  He noticed the pain and the erythema on 09/07/21.  His symptoms gradually worsened.  No subjective fevers or chills.  Due to concern for cellulitis in an immunocompromised state, EDP started empiric IV antibiotics and requested admission for further IV antibiotic treatment given he is immunocompromised. ?  ? ? ?HOSPITAL COURSE:  ? ?Left lower extremity cellulitis ?Patient was started on ceftriaxone.  WBC has improved.  He is remains afebrile.  Cultures negative.  Symptoms have improved.  Able to ambulate.  Transitioned to Keflex and doxycycline at discharge.    ?  ?CKD 4/post renal transplantation ?Appears to be at his baseline creatinine of 2.6 with GFR of 25 ?Avoid nephrotoxic agents, dehydration and hypotension. ?Follows at [facility]. ?Noted to be on prednisone, Prograf, fluconazole. ?Apparently not on atovaquone and acyclovir anymore. ?  ?Failure to thrive in adult ?Physical  debility ?Likely secondary to cellulitis on his chronic comorbidities.  Mobilized by PT and OT.  No needs identified. ?  ?Hyperlipidemia ?Continue Crestor ?  ?History of pulmonary embolism ?Continue Eliquis  ?  ?Diabetes mellitus type 2 with renal complications chronic kidney disease stage IV ?Continue home medication ?  ?Essential hypertension ?Continue home medications ?  ?Anemia of chronic kidney disease stage 4 ?Hemoglobin stable.  No evidence of overt bleeding. ?  ? ?Patient is stable.  Okay for discharge home today. ? ? ?PERTINENT LABS: ? ?The results of significant diagnostics from this hospitalization (including imaging, microbiology, ancillary and laboratory) are listed below for reference.   ? ?Microbiology: ?Recent Results (from the past 240 hour(s))  ?MRSA Next Gen by PCR, Nasal     Status: None  ? Collection Time: 09/09/21  4:46 AM  ? Specimen: Nasal Mucosa; Nasal Swab  ?Result Value Ref Range Status  ? MRSA by PCR Next Gen NOT DETECTED NOT DETECTED Final  ?  Comment: (NOTE) ?The GeneXpert MRSA Assay (FDA approved for NASAL specimens only), ?is one component of a comprehensive MRSA colonization surveillance ?program. It is not intended to diagnose MRSA infection nor to guide ?or monitor treatment for MRSA infections. ?Test performance is not FDA approved in patients less than 2 years ?old. ?Performed at [facility], [place] [address], [place], [place] ?[address] ?  ?Culture, blood (Routine X 2) w Reflex to ID Panel     Status: None (Preliminary result)  ? Collection Time: 09/09/21  5:15 AM  ? Specimen: BLOOD  ?Result Value Ref Range Status  ? Specimen Description BLOOD SITE NOT SPECIFIED  Final  ? Special Requests   Final  ?  BOTTLES DRAWN AEROBIC AND ANAEROBIC Blood Culture adequate volume  ? Culture   Final  ?  NO GROWTH 3 DAYS ?Performed at [facility], [place] [address] [place], [place] [address] ?  ? Report Status PENDING  Incomplete  ?Culture, blood (Routine X 2) w Reflex to ID Panel      Status: None (Preliminary result)  ? Collection Time: 09/09/21  5:25 AM  ? Specimen: BLOOD  ?Result Value Ref Range Status  ? Specimen Description BLOOD SITE NOT SPECIFIED  Final  ? Special Requests   Final  ?  BOTTLES DRAWN AEROBIC AND ANAEROBIC Blood Culture adequate volume  ? Culture   Final  ?  NO GROWTH 3 DAYS ?Performed at [facility], [place] [address], [place], [place] [address] ?  ? Report Status PENDING  Incomplete  ?  ? ?Labs: ? ?COVID-19 Labs ? ? ?Lab Results  ?Component Value Date  ? [place] NEGATIVE 09/25/2020  ? [place] NEGATIVE 07/07/2020  ? [place] NEGATIVE 07/06/2020  ? [place] NEGATIVE 01/16/2020  ? ? ? ? ?Basic Metabolic Panel: ?Recent Labs  ?Lab 09/08/21 ?2027 09/09/21 ?6945 09/10/21 ?0388 09/11/21 ?8280 09/11/21 ?0349  ?NA 136 132* 135 133* 133*  ?K 4.4 4.3 4.9 6.0* 4.8  ?CL 103 101 105 105 103  ?CO2 25 22 21* 17* 22  ?GLUCOSE 95 211* 118* 226* 205*  ?BUN 52* 55* 49* 53* 48*  ?CREATININE 2.67* 2.69* 2.45* 2.88* 2.58*  ?CALCIUM 9.5 8.8* 9.4 9.2 9.4  ?MG  --  1.8  --   --   --   ?PHOS  --  3.6  --   --   --   ? ?Liver Function Tests: ?Recent Labs  ?Lab 09/08/21 ?2027 09/09/21 ?1791  ?AST 14* 15  ?ALT 15 13  ?ALKPHOS 50 44  ?BILITOT 0.5 0.5  ?PROT 6.6 5.6*  ?ALBUMIN 3.7 3.2*  ? ? ?CBC: ?Recent Labs  ?Lab 09/08/21 ?2027 09/09/21 ?5056 09/10/21 ?9794 09/11/21 ?0259  ?WBC 10.7* 10.9* 8.3 9.9  ?NEUTROABS 9.4* 9.5*  --   --   ?HGB 12.6* 11.5* 10.6* 11.3*  ?HCT 39.6 37.0* 33.0* 36.0*  ?MCV 93.4 94.1 92.2 91.8  ?PLT 192 160 143* 160  ? ? ? ?CBG: ?Recent Labs  ?Lab 09/10/21 ?1116 09/10/21 ?1712 09/10/21 ?2054 09/11/21 ?0731 09/11/21 ?1141  ?GLUCAP 200* 404* 219* 242* 273*  ? ? ? ?IMAGING STUDIES ?No results found. ? ?DISCHARGE EXAMINATION: ?Vitals:  ? 09/10/21 0303 09/10/21 0919 09/10/21 2000 09/11/21 0932  ?BP: 136/60 140/68 (!) 142/67 122/63  ?Pulse: 76 78 77 75  ?Resp: '18 18 18 17  '$ ?Temp: 98.4 ?F (36.9 ?C) 98 ?F (36.7 ?C) 98.5 ?F (36.9 ?C) 98.2 ?F (36.8 ?C)  ?TempSrc: Oral Oral Oral    ?SpO2: 100% 100% 96% 100%  ?Weight:      ?Height:      ? ?General appearance: Awake alert.  In no distress ?Resp: Clear to auscultation bilaterally.  Normal effort ?Cardio: S1-S2 is normal regular.  No S3-S4.  No rubs murmurs or bruit ?GI: Abdomen is soft.  Nontender nondistended.  Bowel sounds are present normal.  No masses organomegaly ?Extremities: Improved warmth tenderness swelling of the left lower extremity ? ? ?DISPOSITION: Home ? ?Discharge Instructions   ? ? Call MD for:  difficulty breathing, headache or visual disturbances   Complete by: As directed ?  ? Call MD for:  extreme fatigue   Complete by: As directed ?  ? Call MD for:  persistant dizziness or light-headedness   Complete by: As directed ?  ? Call MD for:  persistant nausea and vomiting   Complete by: As directed ?  ? Call MD for:  severe uncontrolled pain   Complete by: As directed ?  ? Call MD for:  temperature >100.4   Complete by: As directed ?  ? Diet Carb Modified   Complete by: As directed ?  ? Discharge instructions   Complete by: As directed ?  ? Please take your medications as prescribed.  Please be sure to follow-up with your primary care provider in 1 week.  Seek attention if you develop fever chills worsening pain in your leg. ? ?You were cared for by a hospitalist during your hospital stay. If you have any questions about your discharge medications or the care you received while you were in the hospital after you are discharged, you can call the unit and asked to speak with the hospitalist on call if the hospitalist that took care of you is not available. Once you are discharged, your primary care physician will handle any further medical issues. Please note that NO REFILLS for any discharge medications will be authorized once you are discharged, as it is imperative that you return to your primary care physician (or establish a relationship with a primary care physician if you do not have one) for your aftercare needs so that they  can reassess your need for medications and monitor your lab values. If you do not have a primary care physician, you can call [phone] for a physician referral.  ? Discharge wound care:   Complete by:

## 2021-09-11 NOTE — TOC Transition Note (Signed)
Transition of Care (TOC) - CM/SW Discharge Note ? ? ?Patient Details  ?Name: [name] [name] ?MRN: [id number] ?Date of Birth: [birth year] ? ?Transition of Care (TOC) CM/SW Contact:  ?Tom-Johnson, Renea Ee, RN ?Phone Number: ?09/11/2021, 12:10 PM ? ? ?Clinical Narrative:    ? ?Patient is scheduled for discharge today. No PT/OT recommendations noted. Denies any TOC needs. Family to transport at discharge. No further TOC needs noted. ? ?Final next level of care: Home/Self Care ?Barriers to Discharge: Barriers Resolved ? ? ?Patient Goals and CMS Choice ?Patient states their goals for this hospitalization and ongoing recovery are:: To return home ?CMS Medicare.gov Compare Post Acute Care list provided to:: Patient ?Choice offered to / list presented to : NA ? ?Discharge Placement ?  ?           ?  ?Patient to be transferred to facility by: Family ?  ?  ? ?Discharge Plan and Services ?  ?Discharge Planning Services: CM Consult ?Post Acute Care Choice: NA          ?DME Arranged: N/A ?DME Agency: NA ?  ?  ?  ?HH Arranged: NA ?[place] Agency: NA ?  ?  ?  ? ?Social Determinants of Health (SDOH) Interventions ?  ? ? ?Readmission Risk Interventions ? ?  09/27/2020  ? 12:51 PM 09/27/2020  ? 11:14 AM  ?Readmission Risk Prevention Plan  ?Transportation Screening  Complete  ?PCP or Specialist appointment within 3-5 days of discharge Complete   ?[place] or Home Care Consult  Complete  ?SW Recovery Care/Counseling Consult  Complete  ?Palliative Care Screening  Not Applicable  ?[place]  Not Applicable  ? ? ? ? ? ?

## 2021-09-11 NOTE — Progress Notes (Signed)
Occupational Therapy Treatment ?Patient Details ?Name: [name] [name] ?MRN: [id number] ?DOB: Jun 15, 1950 ?Today's Date: 09/11/2021 ? ? ?History of present illness 71 y/o male presented to ED on 09/08/21 for L leg warmth, swelling, and pain. Found to have LLE cellulitis. 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  ?OT comments ? Patient received in bed and eager to participate with OT. Patient ambulated to bathroom with min guard and performed toilet transfer.  Patient was unable to make it to toilet before having a BM and required bathing and dressing from toilet before exiting bathroom. Patient able to reach feet without adaptive equipment for LB dressing and ambulated to recliner with min guard.  Patient will continue to be followed by Acute OT while in this setting.   ? ?Recommendations for follow up therapy are one component of a multi-disciplinary discharge planning process, led by the attending physician.  Recommendations may be updated based on patient status, additional functional criteria and insurance authorization. ?   ?Follow Up Recommendations ? No OT follow up  ?  ?Assistance Recommended at Discharge Frequent or constant Supervision/Assistance  ?Patient can return home with the following ? A little help with walking and/or transfers;A little help with bathing/dressing/bathroom;Assistance with cooking/housework;Assist for transportation;Help with stairs or ramp for entrance ?  ?Equipment Recommendations ? Other (comment) (reacher)  ?  ?Recommendations for Other Services   ? ?  ?Precautions / Restrictions Precautions ?Precautions: Fall ?Precaution Comments: fistula RUE ?Restrictions ?Weight Bearing Restrictions: No  ? ? ?  ? ?Mobility Bed Mobility ?Overal bed mobility: Modified Independent ?  ?  ?  ?  ?  ?  ?General bed mobility comments: able to get to EOB without assistance ?  ? ?Transfers ?Overall transfer level: Needs assistance ?Equipment  used: Rolling walker (2 wheels) ?Transfers: Sit to/from Stand ?Sit to Stand: Supervision ?  ?  ?  ?  ?  ?General transfer comment: min guard for safety with turning ?  ?  ?Balance Overall balance assessment: Needs assistance ?Sitting-balance support: Feet supported ?Sitting balance-Leahy Scale: Normal ?  ?  ?Standing balance support: Single extremity supported, Bilateral upper extremity supported, During functional activity ?Standing balance-Leahy Scale: Poor ?Standing balance comment: able to pull up brief while standing ?  ?  ?  ?  ?  ?  ?  ?  ?  ?  ?  ?   ? ?ADL either performed or assessed with clinical judgement  ? ?ADL Overall ADL's : Needs assistance/impaired ?  ?  ?Grooming: Dance movement psychotherapist;Wash/dry hands;Set up;Sitting ?Grooming Details (indicate cue type and reason): on toilet ?Upper Body Bathing: Set up;Sitting ?Upper Body Bathing Details (indicate cue type and reason): on toilet ?Lower Body Bathing: Minimal assistance;Sit to/from stand;Sitting/lateral leans ?Lower Body Bathing Details (indicate cue type and reason): seated/standing from toilet ?Upper Body Dressing : Set up;Sitting ?  ?Lower Body Dressing: Supervision/safety;Sit to/from stand ?Lower Body Dressing Details (indicate cue type and reason): able to donn pull up briefs seated and pull up while standing ?Toilet Transfer: Min guard;Rolling walker (2 wheels);Ambulation ?Toilet Transfer Details (indicate cue type and reason): ambulated to toilet with RW and min guard assist ?Toileting- Clothing Manipulation and Hygiene: Supervision/safety;Sitting/lateral lean ?Toileting - Clothing Manipulation Details (indicate cue type and reason): increased assistance to complete due to BM on toilet seat ?  ?  ?  ?General ADL Comments: patient performed bathing and dressing from toilet due to after having a BM patient missed toilet and dirty  self and clothing ?  ? ?Extremity/Trunk Assessment   ?  ?  ?  ?  ?  ? ?Vision   ?  ?  ?Perception   ?  ?Praxis   ?   ? ?Cognition Arousal/Alertness: Awake/alert ?Behavior During Therapy: [facility] for tasks assessed/performed ?Overall Cognitive Status: Within Functional Limits for tasks assessed ?  ?  ?  ?  ?  ?  ?  ?  ?  ?  ?  ?  ?  ?  ?  ?  ?General Comments: pleasant and alert ?  ?  ?   ?Exercises   ? ?  ?Shoulder Instructions   ? ? ?  ?General Comments    ? ? ?Pertinent Vitals/ Pain       Pain Assessment ?Pain Assessment: Faces ?Faces Pain Scale: Hurts a little bit ?Pain Location: L thigh ?Pain Descriptors / Indicators: Sore ?Pain Intervention(s): Monitored during session ? ?Home Living   ?  ?  ?  ?  ?  ?  ?  ?  ?  ?  ?  ?  ?  ?  ?  ?  ?  ?  ? ?  ?Prior Functioning/Environment    ?  ?  ?  ?   ? ?Frequency ? Min 2X/week  ? ? ? ? ?  ?Progress Toward Goals ? ?OT Goals(current goals can now be found in the care plan section) ? Progress towards OT goals: Progressing toward goals ? ?Acute Rehab OT Goals ?Patient Stated Goal: go home ?OT Goal Formulation: With patient ?Time For Goal Achievement: 09/24/21 ?Potential to Achieve Goals: Good ?ADL Goals ?Pt Will Perform Lower Body Dressing: with modified independence;sit to/from stand;sitting/lateral leans;with adaptive equipment ?Additional ADL Goal #1: Patient will perform 10 min functional activity or exercise activity as evidence of improving activity tolerance  ?Plan Discharge plan remains appropriate   ? ?Co-evaluation ? ? ?   ?  ?  ?  ?  ? ?  ?AM-PAC OT "6 Clicks" Daily Activity     ?Outcome Measure ? ? Help from another person eating meals?: None ?Help from another person taking care of personal grooming?: A Little ?Help from another person toileting, which includes using toliet, bedpan, or urinal?: A Little ?Help from another person bathing (including washing, rinsing, drying)?: A Little ?Help from another person to put on and taking off regular upper body clothing?: A Little ?Help from another person to put on and taking off regular lower body clothing?: A Little ?6 Click Score:  19 ? ?  ?End of Session Equipment Utilized During Treatment: Gait belt;Rolling walker (2 wheels) ? ?OT Visit Diagnosis: Unsteadiness on feet (R26.81);Pain ?  ?Activity Tolerance Patient tolerated treatment well ?  ?Patient Left in chair;with call bell/phone within reach;with chair alarm set ?  ?Nurse Communication Mobility status ?  ? ?   ? ?Time: 6644-0347 ?OT Time Calculation (min): 44 min ? ?Charges: OT General Charges ?$OT Visit: 1 Visit ?OT Treatments ?$Self Care/Home Management : 38-52 mins ? ?Lodema Hong, OTA ?Acute Rehabilitation Services  ?Pager [phone] ?Office [phone] ? ? ?[place] ?09/11/2021, 11:00 AM ?

## 2021-09-11 NOTE — Progress Notes (Signed)
[name] [name] to be discharged Home per MD order. Discussed prescriptions and follow up appointments with the patient. Prescriptions given to patient; medication list explained in detail. Patient verbalized understanding. ? ?Skin clean, dry and intact without evidence of skin break down, no evidence of skin tears noted. IV catheter discontinued intact. Site without signs and symptoms of complications. Dressing and pressure applied. Pt denies pain at the site currently. No complaints noted. ? ?Patient free of lines, drains, and wounds.  ? ?An After Visit Summary (AVS) was printed and given to the patient. ?Patient escorted via wheelchair, and discharged home via private auto. ? ?Amaryllis Dyke, RN  ?

## 2021-09-11 NOTE — Care Management Important Message (Signed)
Important Message ? ?Patient Details  ?Name: [name] [name] ?MRN: [id number] ?Date of Birth: 01-Dec-1950 ? ? ?Medicare Important Message Given:  Yes ? ? ? ? ?Rayonna Heldman ?09/11/2021, 3:26 PM ?

## 2021-09-12 DIAGNOSIS — E1165 Type 2 diabetes mellitus with hyperglycemia: Secondary | ICD-10-CM | POA: Diagnosis not present

## 2021-09-14 LAB — CULTURE, BLOOD (ROUTINE X 2)
Culture: NO GROWTH
Culture: NO GROWTH
Special Requests: ADEQUATE
Special Requests: ADEQUATE

## 2021-10-06 ENCOUNTER — Telehealth: Payer: Self-pay | Admitting: *Deleted

## 2021-10-06 DIAGNOSIS — Z794 Long term (current) use of insulin: Secondary | ICD-10-CM | POA: Diagnosis not present

## 2021-10-06 DIAGNOSIS — E1165 Type 2 diabetes mellitus with hyperglycemia: Secondary | ICD-10-CM | POA: Diagnosis not present

## 2021-10-06 NOTE — Telephone Encounter (Signed)
Agustin Cree, PA [facility], is calling because the patient has cellulitis in his left leg.  It is red and swollen.  Patient was given doxycycline and keflex at the hospital but did not take them.  Junie Panning advised the patient to begin his medication. Office visit scheduled for 10/06/21.

## 2021-10-07 ENCOUNTER — Ambulatory Visit (INDEPENDENT_AMBULATORY_CARE_PROVIDER_SITE_OTHER): Payer: Medicare Other | Admitting: Internal Medicine

## 2021-10-07 ENCOUNTER — Encounter: Payer: Self-pay | Admitting: Internal Medicine

## 2021-10-07 VITALS — BP 120/62 | HR 76 | Temp 97.5°F | Ht 69.0 in | Wt 137.8 lb

## 2021-10-07 DIAGNOSIS — L03116 Cellulitis of left lower limb: Secondary | ICD-10-CM | POA: Diagnosis not present

## 2021-10-07 NOTE — Progress Notes (Signed)
Established Patient Office Visit     CC/Reason for Visit: Follow-up cellulitis  HPI: [name] [name] is a 71 y.o. male who is coming in today for the above mentioned reasons.  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  He was admitted end of April with a left lower extremity cellulitis.  It appears he never filled his antibiotics upon discharge.  Yesterday he had a follow-up with his endocrinologist who noted this and made him follow-up with me today.  In the interim, his daughter has filled his cephalexin prescription and he has started taking.  He feels well and has no concerns.  He is overdue for annual wellness visit and physical exam.  Past Medical/Surgical History: Past Medical History:  Diagnosis Date   Anemia    as a younger man   Arthritis    hands   Cataract    Chronic kidney disease    "starting to bother me now" (07/28/2012)   Diabetes mellitus     type II   Diarrhea, functional    ED (erectile dysfunction)    Hypertension    Neuropathy    Peripheral neuropathy     Past Surgical History:  Procedure Laterality Date   APPENDECTOMY  1950's   AV FISTULA PLACEMENT Right 05/14/2016   Procedure: ARTERIOVENOUS (AV) FISTULA CREATION- RIGHT ARM;  Surgeon: Serafina Mitchell, MD;  Location: [facility];  Service: Vascular;  Laterality: Right;   COLONOSCOPY     KIDNEY TRANSPLANT Right    PROSTATE SURGERY     UPPER GASTROINTESTINAL ENDOSCOPY      Social History:  reports that he quit smoking about 35 years ago. His smoking use included cigarettes. He has a 1.20 pack-year smoking history. He has never used smokeless tobacco. He reports that he does not currently use alcohol after a past usage of about 1.0 standard drink per week. He reports that he does not use drugs.  Allergies: Allergies  Allergen Reactions   Other Rash    Oxyquinoline-white Pet-lanolin   Bag Balm  [Albolene] Rash   Cold Cream Rash     Family History:  Family History  Problem Relation Age of Onset   Colon polyps Mother    Stroke Mother    Diabetes Father    Diabetes Cousin    Prostate cancer Brother    Other Daughter        kidney problems   Esophageal cancer Maternal Aunt    Colon cancer Neg Hx    Stomach cancer Neg Hx    Rectal cancer Neg Hx      Current Outpatient Medications:    amLODipine (NORVASC) 2.5 MG tablet, Take 2.5 mg by mouth daily., Disp: , Rfl:    apixaban (ELIQUIS) 5 MG TABS tablet, Take 5 mg by mouth 2 (two) times daily., Disp: , Rfl:    BAQSIMI ONE PACK 3 MG/DOSE POWD, Place 1 spray into the nose daily as needed for low blood sugar., Disp: , Rfl:    carvedilol (COREG) 6.25 MG tablet, Take 6.25 mg by mouth 2 (two) times daily., Disp: , Rfl:    famotidine (PEPCID) 20 MG tablet, Take 20 mg by mouth 2 (two) times daily., Disp: , Rfl:    fluconazole (DIFLUCAN) 200 MG tablet, Take 400 mg by mouth daily., Disp: , Rfl:    folic acid (FOLVITE) 1 MG tablet, Take 1 mg by mouth daily., Disp: , Rfl:    furosemide (LASIX) 40 MG  tablet, Take 40 mg by mouth daily., Disp: , Rfl:    insulin aspart (NOVOLOG) 100 UNIT/ML injection, Inject 0-15 Units into the skin 4 (four) times daily - after meals and at bedtime. (Patient taking differently: Inject 5 Units into the skin 3 (three) times daily. Per sliding scale), Disp: 10 mL, Rfl: 0   insulin glargine (LANTUS SOLOSTAR) 100 UNIT/ML Solostar Pen, Inject 10 Units into the skin at bedtime., Disp: , Rfl:    Melatonin 10 MG TABS, Take 10 mg by mouth at bedtime., Disp: , Rfl:    oxyCODONE (OXY IR/ROXICODONE) 5 MG immediate release tablet, Take 1 tablet (5 mg total) by mouth every 6 (six) hours as needed for severe pain., Disp: 15 tablet, Rfl: 0   predniSONE (DELTASONE) 5 MG tablet, Take 10 mg by mouth daily with breakfast., Disp: , Rfl:    rosuvastatin (CRESTOR) 20 MG tablet, TAKE 1 TABLET BY MOUTH EVERY DAY (Patient taking differently: Take 20 mg by mouth daily.), Disp:  90 tablet, Rfl: 1   tacrolimus (PROGRAF) 1 MG capsule, Take 1 mg by mouth 2 (two) times daily., Disp: , Rfl:    thiamine 100 MG tablet, Take 100 mg by mouth daily., Disp: , Rfl:   Review of Systems:  Constitutional: Denies fever, chills, diaphoresis, appetite change and fatigue.  HEENT: Denies photophobia, eye pain, redness, hearing loss, ear pain, congestion, sore throat, rhinorrhea, sneezing, mouth sores, trouble swallowing, neck pain, neck stiffness and tinnitus.   Respiratory: Denies SOB, DOE, cough, chest tightness,  and wheezing.   Cardiovascular: Denies chest pain, palpitations and leg swelling.  Gastrointestinal: Denies nausea, vomiting, abdominal pain, diarrhea, constipation, blood in stool and abdominal distention.  Genitourinary: Denies dysuria, urgency, frequency, hematuria, flank pain and difficulty urinating.  Endocrine: Denies: hot or cold intolerance, sweats, changes in hair or nails, polyuria, polydipsia. Musculoskeletal: Denies myalgias, back pain, joint swelling, arthralgias and gait problem.  Skin: Denies pallor, rash and wound.  Neurological: Denies dizziness, seizures, syncope, weakness, light-headedness, numbness and headaches.  Hematological: Denies adenopathy. Easy bruising, personal or family bleeding history  Psychiatric/Behavioral: Denies suicidal ideation, mood changes, confusion, nervousness, sleep disturbance and agitation    Physical Exam: Vitals:   10/07/21 1603  BP: 120/62  Pulse: 76  Temp: (!) 97.5 F (36.4 C)  TempSrc: Oral  SpO2: 99%  Weight: 137 lb 12.8 oz (62.5 kg)  Height: '5\' 9"'$  (1.753 m)    Body mass index is 20.35 kg/m.   Constitutional: NAD, calm, comfortable Eyes: PERRL, lids and conjunctivae normal ENMT: Mucous membranes are moist.  Skin: Minimal edema and induration left lower extremity as compared to right. Psychiatric: Normal judgment and insight. Alert and oriented x 3. Normal mood.    Impression and Plan:  Cellulitis of  left lower extremity -He has already resume cephalexin prescription.  He will follow-up with me soon for his annual physical.   Time spent:21 minutes reviewing chart, interviewing and examining patient and formulating plan of care.    Lelon Frohlich, MD [facility] at [facility]

## 2021-10-13 DIAGNOSIS — E1165 Type 2 diabetes mellitus with hyperglycemia: Secondary | ICD-10-CM | POA: Diagnosis not present

## 2021-10-16 DIAGNOSIS — B451 Cerebral cryptococcosis: Secondary | ICD-10-CM | POA: Diagnosis not present

## 2021-10-16 DIAGNOSIS — Z940 Kidney transplant status: Secondary | ICD-10-CM | POA: Diagnosis not present

## 2021-10-16 DIAGNOSIS — Z794 Long term (current) use of insulin: Secondary | ICD-10-CM | POA: Diagnosis not present

## 2021-10-16 DIAGNOSIS — I82432 Acute embolism and thrombosis of left popliteal vein: Secondary | ICD-10-CM | POA: Diagnosis not present

## 2021-10-16 DIAGNOSIS — R5381 Other malaise: Secondary | ICD-10-CM | POA: Diagnosis not present

## 2021-10-16 DIAGNOSIS — D849 Immunodeficiency, unspecified: Secondary | ICD-10-CM | POA: Diagnosis not present

## 2021-10-16 DIAGNOSIS — E1121 Type 2 diabetes mellitus with diabetic nephropathy: Secondary | ICD-10-CM | POA: Diagnosis not present

## 2021-10-16 LAB — BASIC METABOLIC PANEL
Creatinine: 3 — AB (ref 0.6–1.3)
Potassium: 4.5 mEq/L (ref 3.5–5.1)

## 2021-10-16 LAB — CBC AND DIFFERENTIAL
Hemoglobin: 11.8 — AB (ref 13.5–17.5)
WBC: 7

## 2021-10-23 DIAGNOSIS — T380X5A Adverse effect of glucocorticoids and synthetic analogues, initial encounter: Secondary | ICD-10-CM | POA: Diagnosis not present

## 2021-10-23 DIAGNOSIS — Z940 Kidney transplant status: Secondary | ICD-10-CM | POA: Diagnosis not present

## 2021-10-23 DIAGNOSIS — Z794 Long term (current) use of insulin: Secondary | ICD-10-CM | POA: Diagnosis not present

## 2021-10-23 DIAGNOSIS — E1165 Type 2 diabetes mellitus with hyperglycemia: Secondary | ICD-10-CM | POA: Diagnosis not present

## 2021-10-28 DIAGNOSIS — D849 Immunodeficiency, unspecified: Secondary | ICD-10-CM | POA: Diagnosis not present

## 2021-10-28 DIAGNOSIS — Z940 Kidney transplant status: Secondary | ICD-10-CM | POA: Diagnosis not present

## 2021-11-04 DIAGNOSIS — D849 Immunodeficiency, unspecified: Secondary | ICD-10-CM | POA: Diagnosis not present

## 2021-11-04 DIAGNOSIS — Z940 Kidney transplant status: Secondary | ICD-10-CM | POA: Diagnosis not present

## 2021-11-12 ENCOUNTER — Encounter: Payer: Self-pay | Admitting: Internal Medicine

## 2021-11-13 DIAGNOSIS — E1165 Type 2 diabetes mellitus with hyperglycemia: Secondary | ICD-10-CM | POA: Diagnosis not present

## 2021-11-27 LAB — HEMOGLOBIN A1C: Hemoglobin A1C: 8.7

## 2021-12-01 ENCOUNTER — Ambulatory Visit: Payer: Medicare Other | Admitting: Internal Medicine

## 2021-12-03 ENCOUNTER — Ambulatory Visit (INDEPENDENT_AMBULATORY_CARE_PROVIDER_SITE_OTHER): Payer: Medicare Other | Admitting: Internal Medicine

## 2021-12-03 ENCOUNTER — Encounter: Payer: Self-pay | Admitting: Internal Medicine

## 2021-12-03 VITALS — BP 120/58 | HR 70 | Temp 97.6°F | Wt 144.3 lb

## 2021-12-03 DIAGNOSIS — Z940 Kidney transplant status: Secondary | ICD-10-CM

## 2021-12-03 DIAGNOSIS — E1140 Type 2 diabetes mellitus with diabetic neuropathy, unspecified: Secondary | ICD-10-CM

## 2021-12-03 DIAGNOSIS — I2694 Multiple subsegmental pulmonary emboli without acute cor pulmonale: Secondary | ICD-10-CM

## 2021-12-03 LAB — MICROALBUMIN / CREATININE URINE RATIO
Creatinine,U: 206.8 mg/dL
Microalb Creat Ratio: 6.6 mg/g (ref 0.0–30.0)
Microalb, Ur: 13.5 mg/dL — ABNORMAL HIGH (ref 0.0–1.9)

## 2021-12-03 NOTE — Progress Notes (Signed)
Established Patient Office Visit     CC/Reason for Visit: Follow-up chronic conditions  HPI: [name] [name] is a 71 y.o. male who is coming in today for the above mentioned reasons. Past Medical History is significant for: Hypertension, hyperlipidemia, insulin-dependent diabetes.  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  He has been following with his transplant team at [facility], in fact all of his medical care is at [facility] with the exception of me.  He has been feeling really well and has no acute concerns today.  He recently had a nurse visit with [place] [facility] and he was told that he needed an annual wellness visit, pneumonia vaccine and his A1c checked.  This is in fact all updated.  He had his annual wellness visit in January 2023, he has had both PCV13 and PPSV23.  He also follows routinely with his endocrinologist out of [facility] in regards to his diabetes.   Past Medical/Surgical History: Past Medical History:  Diagnosis Date   Anemia    as a younger man   Arthritis    hands   Cataract    Chronic kidney disease    "starting to bother me now" (07/28/2012)   Diabetes mellitus     type II   Diarrhea, functional    ED (erectile dysfunction)    Hypertension    Neuropathy    Peripheral neuropathy     Past Surgical History:  Procedure Laterality Date   APPENDECTOMY  1950's   AV FISTULA PLACEMENT Right 05/14/2016   Procedure: ARTERIOVENOUS (AV) FISTULA CREATION- RIGHT ARM;  Surgeon: Serafina Mitchell, MD;  Location: [facility];  Service: Vascular;  Laterality: Right;   COLONOSCOPY     KIDNEY TRANSPLANT Right    PROSTATE SURGERY     UPPER GASTROINTESTINAL ENDOSCOPY      Social History:  reports that he quit smoking about 35 years ago. His smoking use included cigarettes. He has a 1.20 pack-year smoking history. He has never used smokeless tobacco. He reports that he does not  currently use alcohol after a past usage of about 1.0 standard drink of alcohol per week. He reports that he does not use drugs.  Allergies: Allergies  Allergen Reactions   Other Rash    Oxyquinoline-white Pet-lanolin   Bag Balm  [Albolene] Rash   Cold Cream Rash    Family History:  Family History  Problem Relation Age of Onset   Colon polyps Mother    Stroke Mother    Diabetes Father    Diabetes Cousin    Prostate cancer Brother    Other Daughter        kidney problems   Esophageal cancer Maternal Aunt    Colon cancer Neg Hx    Stomach cancer Neg Hx    Rectal cancer Neg Hx      Current Outpatient Medications:    amLODipine (NORVASC) 2.5 MG tablet, Take 2.5 mg by mouth daily., Disp: , Rfl:    apixaban (ELIQUIS) 5 MG TABS tablet, Take 5 mg by mouth 2 (two) times daily., Disp: , Rfl:    BAQSIMI ONE PACK 3 MG/DOSE POWD, Place 1 spray into the nose daily as needed for low blood sugar., Disp: , Rfl:    carvedilol (COREG) 6.25 MG tablet, Take 6.25 mg by mouth 2 (two) times daily., Disp: , Rfl:    famotidine (PEPCID) 20 MG tablet, Take 20 mg by mouth  2 (two) times daily., Disp: , Rfl:    folic acid (FOLVITE) 1 MG tablet, Take 1 mg by mouth daily., Disp: , Rfl:    furosemide (LASIX) 40 MG tablet, Take 40 mg by mouth daily., Disp: , Rfl:    insulin aspart (NOVOLOG) 100 UNIT/ML injection, Inject 0-15 Units into the skin 4 (four) times daily - after meals and at bedtime. (Patient taking differently: Inject 5 Units into the skin 3 (three) times daily. Per sliding scale), Disp: 10 mL, Rfl: 0   insulin glargine (LANTUS SOLOSTAR) 100 UNIT/ML Solostar Pen, Inject 10 Units into the skin at bedtime., Disp: , Rfl:    Melatonin 10 MG TABS, Take 10 mg by mouth at bedtime., Disp: , Rfl:    predniSONE (DELTASONE) 5 MG tablet, Take 10 mg by mouth daily with breakfast., Disp: , Rfl:    rosuvastatin (CRESTOR) 20 MG tablet, TAKE 1 TABLET BY MOUTH EVERY DAY (Patient taking differently: Take 20 mg by  mouth daily.), Disp: 90 tablet, Rfl: 1   tacrolimus (PROGRAF) 1 MG capsule, Take 1 mg by mouth 2 (two) times daily., Disp: , Rfl:    fluconazole (DIFLUCAN) 200 MG tablet, Take 400 mg by mouth daily., Disp: , Rfl:    thiamine 100 MG tablet, Take 100 mg by mouth daily., Disp: , Rfl:   Review of Systems:  Constitutional: Denies fever, chills, diaphoresis, appetite change and fatigue.  HEENT: Denies photophobia, eye pain, redness, hearing loss, ear pain, congestion, sore throat, rhinorrhea, sneezing, mouth sores, trouble swallowing, neck pain, neck stiffness and tinnitus.   Respiratory: Denies SOB, DOE, cough, chest tightness,  and wheezing.   Cardiovascular: Denies chest pain, palpitations and leg swelling.  Gastrointestinal: Denies nausea, vomiting, abdominal pain, diarrhea, constipation, blood in stool and abdominal distention.  Genitourinary: Denies dysuria, urgency, frequency, hematuria, flank pain and difficulty urinating.  Endocrine: Denies: hot or cold intolerance, sweats, changes in hair or nails, polyuria, polydipsia. Musculoskeletal: Denies myalgias, back pain, joint swelling, arthralgias and gait problem.  Skin: Denies pallor, rash and wound.  Neurological: Denies dizziness, seizures, syncope, weakness, light-headedness, numbness and headaches.  Hematological: Denies adenopathy. Easy bruising, personal or family bleeding history  Psychiatric/Behavioral: Denies suicidal ideation, mood changes, confusion, nervousness, sleep disturbance and agitation    Physical Exam: Vitals:   12/03/21 1031  BP: (!) 120/58  Pulse: 70  Temp: 97.6 F (36.4 C)  TempSrc: Oral  SpO2: 98%  Weight: 144 lb 4.8 oz (65.5 kg)    Body mass index is 21.31 kg/m.   Constitutional: NAD, calm, comfortable Eyes: PERRL, lids and conjunctivae normal ENMT: Mucous membranes are moist.  Respiratory: clear to auscultation bilaterally, no wheezing, no crackles. Normal respiratory effort. No accessory muscle use.   Cardiovascular: Regular rate and rhythm, no murmurs / rubs / gallops. No extremity edema. Neurologic: Grossly intact and nonfocal Psychiatric: Normal judgment and insight. Alert and oriented x 3. Normal mood.    Impression and Plan:  Type 2 diabetes mellitus with diabetic neuropathy, without long-term current use of insulin ([place])  - Plan: Microalbumin/Creatinine Ratio, Urine -Most recent A1c was 8.7 in late June, followed by [facility] endocrinology.  Kidney transplanted -Followed by the [facility] transplant center.  Multiple subsegmental pulmonary emboli without acute cor pulmonale ([name]) -On chronic Eliquis.    Time spent:30 minutes reviewing chart, interviewing and examining patient and formulating plan of care.    Lelon Frohlich, MD [facility] at [facility]

## 2021-12-08 DIAGNOSIS — I878 Other specified disorders of veins: Secondary | ICD-10-CM | POA: Diagnosis not present

## 2021-12-08 DIAGNOSIS — I70213 Atherosclerosis of native arteries of extremities with intermittent claudication, bilateral legs: Secondary | ICD-10-CM | POA: Diagnosis not present

## 2021-12-10 DIAGNOSIS — Z794 Long term (current) use of insulin: Secondary | ICD-10-CM | POA: Diagnosis not present

## 2021-12-10 DIAGNOSIS — T380X5A Adverse effect of glucocorticoids and synthetic analogues, initial encounter: Secondary | ICD-10-CM | POA: Diagnosis not present

## 2021-12-10 DIAGNOSIS — Z87891 Personal history of nicotine dependence: Secondary | ICD-10-CM | POA: Diagnosis not present

## 2021-12-10 DIAGNOSIS — E1165 Type 2 diabetes mellitus with hyperglycemia: Secondary | ICD-10-CM | POA: Diagnosis not present

## 2021-12-15 DIAGNOSIS — E1165 Type 2 diabetes mellitus with hyperglycemia: Secondary | ICD-10-CM | POA: Diagnosis not present

## 2022-01-02 ENCOUNTER — Ambulatory Visit (INDEPENDENT_AMBULATORY_CARE_PROVIDER_SITE_OTHER): Payer: Medicare Other | Admitting: Podiatry

## 2022-01-02 DIAGNOSIS — Z992 Dependence on renal dialysis: Secondary | ICD-10-CM

## 2022-01-02 DIAGNOSIS — E1142 Type 2 diabetes mellitus with diabetic polyneuropathy: Secondary | ICD-10-CM

## 2022-01-02 DIAGNOSIS — L97421 Non-pressure chronic ulcer of left heel and midfoot limited to breakdown of skin: Secondary | ICD-10-CM

## 2022-01-02 DIAGNOSIS — D689 Coagulation defect, unspecified: Secondary | ICD-10-CM

## 2022-01-02 DIAGNOSIS — B351 Tinea unguium: Secondary | ICD-10-CM

## 2022-01-02 DIAGNOSIS — M79675 Pain in left toe(s): Secondary | ICD-10-CM | POA: Diagnosis not present

## 2022-01-02 DIAGNOSIS — I739 Peripheral vascular disease, unspecified: Secondary | ICD-10-CM | POA: Diagnosis not present

## 2022-01-02 DIAGNOSIS — N185 Chronic kidney disease, stage 5: Secondary | ICD-10-CM | POA: Diagnosis not present

## 2022-01-02 DIAGNOSIS — M79674 Pain in right toe(s): Secondary | ICD-10-CM | POA: Diagnosis not present

## 2022-01-02 DIAGNOSIS — N186 End stage renal disease: Secondary | ICD-10-CM | POA: Diagnosis not present

## 2022-01-03 DIAGNOSIS — L97409 Non-pressure chronic ulcer of unspecified heel and midfoot with unspecified severity: Secondary | ICD-10-CM | POA: Insufficient documentation

## 2022-01-03 NOTE — Progress Notes (Signed)
This patient returns to my office for at risk foot care.  This patient requires this care by a professional since this patient will be at risk due to having ESRD , PVD , coagulation defect.and diabetes. Patient has not been seen in over 7  months.  This patient is unable to cut nails himself since the patient cannot reach his nails.These nails are painful walking and wearing shoes.  He also says he has an open wound which developed and inside left heel  two weeks ago.  He has been bandaging his open wound and wearing a bandage.  He says thos wound has been getting less painful.  This patient presents for at risk foot care today.   General Appearance  Alert, conversant and in no acute stress.  Vascular  Dorsalis pedis and posterior tibial  pulses are  weakly palpable  bilaterally.  Capillary return is within normal limits  bilaterally. Cold feet   bilaterally.  Neurologic  Senn-Weinstein monofilament wire test within normal limits  bilaterally. Muscle power within normal limits bilaterally.  Nails Thick disfigured discolored nails with subungual debris  from hallux to fifth toes bilaterally. No evidence of bacterial infection or drainage bilaterally.  Orthopedic  No limitations of motion  feet .  No crepitus or effusions noted.  No bony pathology or digital deformities noted.  Plantar flexed met heads 2-4  B/L.   Skin  normotropic skin noted bilaterally.  No signs of infections.  Open wound left heel.  No infection or drainage noted.  Ulcer is only skin deep.   Onychomycosis  Pain in right toes  Pain in left toes  Ulcer left heel.  Consent was obtained for treatment procedures.   Mechanical debridement of nails 1-5  bilaterally performed with a nail nipper.  Filed with dremel without incident.  Debride necrotic tissue and bandaged with neosporin/DSD.  Consulted with Dr.  Posey Pronto who gave him home care instructions and is to return to see Dr.  Posey Pronto in two weeks. Call if the wound worsens he is to call  the office.Marland Kitchen  He says he is being followed at [facility] for his vascular studies.  He says he was seen one months ago.   Return office visit   3 months  for nail care.                 Told patient to return for periodic foot care and evaluation due to potential at risk complications.   Gardiner Barefoot DPM

## 2022-01-07 ENCOUNTER — Encounter (HOSPITAL_COMMUNITY): Payer: Self-pay | Admitting: Emergency Medicine

## 2022-01-07 ENCOUNTER — Emergency Department (HOSPITAL_COMMUNITY): Payer: Medicare Other

## 2022-01-07 ENCOUNTER — Emergency Department (HOSPITAL_COMMUNITY)
Admission: EM | Admit: 2022-01-07 | Discharge: 2022-01-07 | Disposition: A | Discharge status: Home / Self Care | Payer: Medicare Other | Attending: Emergency Medicine | Admitting: Emergency Medicine

## 2022-01-07 ENCOUNTER — Other Ambulatory Visit: Payer: Self-pay

## 2022-01-07 DIAGNOSIS — W19XXXA Unspecified fall, initial encounter: Secondary | ICD-10-CM | POA: Diagnosis not present

## 2022-01-07 DIAGNOSIS — M7732 Calcaneal spur, left foot: Secondary | ICD-10-CM | POA: Diagnosis not present

## 2022-01-07 DIAGNOSIS — R42 Dizziness and giddiness: Secondary | ICD-10-CM | POA: Insufficient documentation

## 2022-01-07 DIAGNOSIS — Z7901 Long term (current) use of anticoagulants: Secondary | ICD-10-CM | POA: Diagnosis not present

## 2022-01-07 DIAGNOSIS — S99922A Unspecified injury of left foot, initial encounter: Secondary | ICD-10-CM | POA: Diagnosis not present

## 2022-01-07 DIAGNOSIS — M4802 Spinal stenosis, cervical region: Secondary | ICD-10-CM | POA: Diagnosis not present

## 2022-01-07 DIAGNOSIS — M4312 Spondylolisthesis, cervical region: Secondary | ICD-10-CM | POA: Diagnosis not present

## 2022-01-07 DIAGNOSIS — M19072 Primary osteoarthritis, left ankle and foot: Secondary | ICD-10-CM | POA: Diagnosis not present

## 2022-01-07 DIAGNOSIS — M47812 Spondylosis without myelopathy or radiculopathy, cervical region: Secondary | ICD-10-CM | POA: Diagnosis not present

## 2022-01-07 DIAGNOSIS — Z79899 Other long term (current) drug therapy: Secondary | ICD-10-CM | POA: Insufficient documentation

## 2022-01-07 DIAGNOSIS — S0990XA Unspecified injury of head, initial encounter: Secondary | ICD-10-CM | POA: Diagnosis not present

## 2022-01-07 DIAGNOSIS — R55 Syncope and collapse: Secondary | ICD-10-CM | POA: Diagnosis not present

## 2022-01-07 DIAGNOSIS — Z794 Long term (current) use of insulin: Secondary | ICD-10-CM | POA: Insufficient documentation

## 2022-01-07 LAB — BASIC METABOLIC PANEL
Anion gap: 7 (ref 5–15)
BUN: 40 mg/dL — ABNORMAL HIGH (ref 8–23)
CO2: 25 mmol/L (ref 22–32)
Calcium: 9.6 mg/dL (ref 8.9–10.3)
Chloride: 106 mmol/L (ref 98–111)
Creatinine, Ser: 3.06 mg/dL — ABNORMAL HIGH (ref 0.61–1.24)
GFR, Estimated: 21 mL/min — ABNORMAL LOW (ref >60)
Glucose, Bld: 100 mg/dL — ABNORMAL HIGH (ref 70–99)
Potassium: 4.3 mmol/L (ref 3.5–5.1)
Sodium: 138 mmol/L (ref 135–145)

## 2022-01-07 LAB — CBC
HCT: 35.7 % — ABNORMAL LOW (ref 39.0–52.0)
Hemoglobin: 11.3 g/dL — ABNORMAL LOW (ref 13.0–17.0)
MCH: 28.6 pg (ref 26.0–34.0)
MCHC: 31.7 g/dL (ref 30.0–36.0)
MCV: 90.4 fL (ref 80.0–100.0)
Platelets: 204 10*3/uL (ref 150–400)
RBC: 3.95 MIL/uL — ABNORMAL LOW (ref 4.22–5.81)
RDW: 13.2 % (ref 11.5–15.5)
WBC: 5.1 10*3/uL (ref 4.0–10.5)
nRBC: 0 % (ref 0.0–0.2)

## 2022-01-07 LAB — CBG MONITORING, ED: Glucose-Capillary: 95 mg/dL (ref 70–99)

## 2022-01-07 NOTE — ED Provider Notes (Signed)
[facility] EMERGENCY DEPARTMENT Provider Note   CSN: [id number] Arrival date & time: 01/07/22  1145     History  Chief Complaint  Patient presents with   Lytle Michaels    [name] [name] is a 71 y.o. male.  71 year old male with prior medical history as detailed below presents for evaluation.  Patient reports that he had a fall on Saturday morning.  Patient reports that he was in his bathroom and after he urinated he felt somewhat lightheaded and then lost his balance and fell to the floor.  He is not sure whether he struck his head.  He denies associated palpitations or chest pain.  He reports intermittent lightheadedness with rapid changes in position.  He reports intermittent lightheadedness with urination and/or bowel movements.  Patient is on Eliquis.  Patient is status post renal transplant followed by [facility].  Patient also has history of cryptococcal meningitis.  Patient without alteration of mental status.  Patient without reported fever.  Patient is ambulating at his baseline per his daughter who is present at bedside.  Patient is alert and comfortable and making jokes.Marland Kitchen  Upon my evaluation he and his family members are all having soup with sandwiches.  He does not appear to be in any distress.    The history is provided by the patient and medical records.  Fall This is a new problem. The current episode started more than 2 days ago. The problem occurs rarely. The problem has not changed since onset.Pertinent negatives include no chest pain, no abdominal pain, no headaches and no shortness of breath.       Home Medications Prior to Admission medications   Medication Sig Start Date End Date Taking? Authorizing Provider  amLODipine (NORVASC) 2.5 MG tablet Take 2.5 mg by mouth daily. 08/16/21   [provider]  apixaban (ELIQUIS) 5 MG TABS tablet Take 5 mg by mouth 2 (two) times daily.    [provider]  BAQSIMI ONE PACK 3 MG/DOSE POWD Place 1  spray into the nose daily as needed for low blood sugar. 05/13/21   [provider]  carvedilol (COREG) 6.25 MG tablet Take 6.25 mg by mouth 2 (two) times daily. 09/11/20   [provider]  famotidine (PEPCID) 20 MG tablet Take 20 mg by mouth 2 (two) times daily. 09/12/20   [provider]  fluconazole (DIFLUCAN) 200 MG tablet Take 400 mg by mouth daily. 09/23/20   [provider]  folic acid (FOLVITE) 1 MG tablet Take 1 mg by mouth daily. 09/12/20   [provider]  furosemide (LASIX) 40 MG tablet Take 40 mg by mouth daily. 07/19/21   [provider]  insulin aspart (NOVOLOG) 100 UNIT/ML injection Inject 0-15 Units into the skin 4 (four) times daily - after meals and at bedtime. Patient taking differently: Inject 5 Units into the skin 3 (three) times daily. Per sliding scale 07/07/20   [name], MD  insulin glargine (LANTUS SOLOSTAR) 100 UNIT/ML Solostar Pen Inject 10 Units into the skin at bedtime.    [provider]  Melatonin 10 MG TABS Take 10 mg by mouth at bedtime.    [provider]  predniSONE (DELTASONE) 5 MG tablet Take 10 mg by mouth daily with breakfast.    [provider]  rosuvastatin (CRESTOR) 20 MG tablet TAKE 1 TABLET BY MOUTH EVERY DAY Patient taking differently: Take 20 mg by mouth daily. 08/18/21   [name], [name], MD  tacrolimus (PROGRAF)  1 MG capsule Take 1 mg by mouth 2 (two) times daily. 10/31/19   [provider]  thiamine 100 MG tablet Take 100 mg by mouth daily. 09/11/20   [provider]      Allergies    Other, Bag balm  [albolene], and Cold cream    Review of Systems   Review of Systems  Respiratory:  Negative for shortness of breath.   Cardiovascular:  Negative for chest pain.  Gastrointestinal:  Negative for abdominal pain.  Neurological:  Negative for headaches.  All other systems reviewed and are negative.   Physical Exam Updated Vital Signs BP (!)  152/67   Pulse 70   Temp 97.9 F (36.6 C) (Oral)   Resp 19   SpO2 100%  Physical Exam Vitals and nursing note reviewed.  Constitutional:      General: He is not in acute distress.    Appearance: Normal appearance. He is well-developed.  HENT:     Head: Normocephalic and atraumatic.  Eyes:     Conjunctiva/sclera: Conjunctivae normal.     Pupils: Pupils are equal, round, and reactive to light.  Cardiovascular:     Rate and Rhythm: Normal rate and regular rhythm.     Heart sounds: Normal heart sounds.  Pulmonary:     Effort: Pulmonary effort is normal. No respiratory distress.     Breath sounds: Normal breath sounds.  Abdominal:     General: There is no distension.     Palpations: Abdomen is soft.     Tenderness: There is no abdominal tenderness.  Musculoskeletal:        General: No deformity. Normal range of motion.     Cervical back: Normal range of motion and neck supple.  Skin:    General: Skin is warm and dry.  Neurological:     General: No focal deficit present.     Mental Status: He is alert and oriented to person, place, and time. Mental status is at baseline.     Cranial Nerves: No cranial nerve deficit.     Sensory: No sensory deficit.     Motor: No weakness.     Coordination: Coordination normal.     ED Results / Procedures / Treatments   Labs (all labs ordered are listed, but only abnormal results are displayed) Labs Reviewed  BASIC METABOLIC PANEL - Abnormal; Notable for the following components:      Result Value   Glucose, Bld 100 (*)    BUN 40 (*)    Creatinine, Ser 3.06 (*)    GFR, Estimated 21 (*)    All other components within normal limits  CBC - Abnormal; Notable for the following components:   RBC 3.95 (*)    Hemoglobin 11.3 (*)    HCT 35.7 (*)    All other components within normal limits  CBG MONITORING, ED    EKG EKG Interpretation  Date/Time:  Wednesday January 07 2022 12:35:27 EDT Ventricular Rate:  71 PR Interval:  138 QRS  Duration: 82 QT Interval:  426 QTC Calculation: 462 R Axis:   77 Text Interpretation: Sinus rhythm with Premature atrial complexes Otherwise normal ECG When compared with ECG of 25-Sep-2020 12:55, PREVIOUS ECG IS PRESENT Confirmed by Dene Gentry [phone]) on 01/07/2022 4:32:28 PM  Radiology CT Cervical Spine Wo Contrast  Result Date: 01/07/2022 CLINICAL DATA:  Neck trauma (Age >= 65y); history of fall EXAM: CT CERVICAL SPINE WITHOUT CONTRAST TECHNIQUE: Multidetector CT imaging of the cervical spine was performed without  intravenous contrast. Multiplanar CT image reconstructions were also generated. RADIATION DOSE REDUCTION: This exam was performed according to the departmental dose-optimization program which includes automated exposure control, adjustment of the mA and/or kV according to patient size and/or use of iterative reconstruction technique. COMPARISON:  None Available. FINDINGS: Alignment: Absence of the normal lordotic curvature. Mild anterolisthesis at C4-C5. Minimal retrolisthesis at C5-C6 and mild 4 mm retrolisthesis at C6-C7. Skull base and vertebrae: No acute fracture. No primary bone lesion or focal pathologic process. Moderate cervical spondylosis with prominent anterior endplate osteophytes. Soft tissues and spinal canal: No prevertebral fluid or swelling. No visible canal hematoma. Disc levels: Degenerative narrowing of the disc space at C5-C6 and C6-C7. Retrolisthesis, posterior osteophyte ridging and facet hypertrophic changes cause mild narrowing of the left neural foramen at C6-C7. Upper chest: Negative. Other: None. IMPRESSION: 1.  No fracture or dislocation. 2. Moderate cervical spondylosis. Mild anterolisthesis at C4-C5, minimal retrolisthesis at C5-C6 and mild 4 mm retrolisthesis at C6-C7. Electronically Signed   By: Frazier Richards M.D.   On: 01/07/2022 13:50   CT HEAD WO CONTRAST  Result Date: 01/07/2022 CLINICAL DATA:  Fall, head trauma. EXAM: CT HEAD WITHOUT CONTRAST  TECHNIQUE: Contiguous axial images were obtained from the base of the skull through the vertex without intravenous contrast. RADIATION DOSE REDUCTION: This exam was performed according to the departmental dose-optimization program which includes automated exposure control, adjustment of the mA and/or kV according to patient size and/or use of iterative reconstruction technique. COMPARISON:  CT examination dated July 05, 2020 FINDINGS: Brain: No evidence of acute infarction, hemorrhage, hydrocephalus, extra-axial collection or mass lesion/mass effect. Scattered areas of low-attenuation of the periventricular white matter presumed chronic microvascular ischemic changes. Vascular: No hyperdense vessel or unexpected calcification. Skull: Normal. Negative for fracture or focal lesion. Sinuses/Orbits: No acute finding. Other: None. IMPRESSION: No acute intracranial abnormality. Electronically Signed   By: Keane Police D.O.   On: 01/07/2022 13:43   DG Chest 1 View  Result Date: 01/07/2022 CLINICAL DATA:  Syncope EXAM: CHEST  1 VIEW COMPARISON:  05/23/2021 FINDINGS: The heart size and mediastinal contours are within normal limits. Both lungs are clear. The visualized skeletal structures are unremarkable. IMPRESSION: No active disease. Electronically Signed   By: Elmer Picker M.D.   On: 01/07/2022 13:01   DG Foot Complete Left  Result Date: 01/07/2022 CLINICAL DATA:  Trauma, pain EXAM: LEFT FOOT - COMPLETE 3+ VIEW COMPARISON:  None Available. FINDINGS: No fracture or dislocation is seen. Degenerative changes are noted in first metatarsophalangeal joint. Small plantar spur is seen in calcaneus. Bony spurs are noted in the dorsal aspect of the intertarsal joints. There is linear coarse calcification at the attachment of Achilles tendon to the calcaneus, possibly calcific tendinosis. Extensive arterial calcifications are seen in soft tissues. IMPRESSION: No fracture or dislocation is seen. Degenerative  changes are noted in multiple joints as described in the body of the report. Small plantar spur is seen in calcaneus. Extensive arterial calcifications in the soft tissues suggest atherosclerosis. Electronically Signed   By: Elmer Picker M.D.   On: 01/07/2022 13:01    Procedures Procedures    Medications Ordered in ED Medications - No data to display  ED Course/ Medical Decision Making/ A&P                           Medical Decision Making Amount and/or Complexity of Data Reviewed Labs: ordered. Radiology: ordered.    Medical  Screen Complete  This patient presented to the ED with complaint of fall.  This complaint involves an extensive number of treatment options. The initial differential diagnosis includes, but is not limited to, he related to fall, metabolic abnormality  This presentation is: Acute, Self-Limited, Previously Undiagnosed, Uncertain Prognosis, Complicated, Systemic Symptoms, and Threat to Life/Bodily Function  Patient presents after reported near syncope and mechanical fall that occurred 3 days prior.  Patient without overt obvious evidence of traumatic injury on exam.  Screening labs and imaging obtained are without significant acute abnormality.  Patient is comfortable at time of evaluation.  Both he and his family are reassured by ED evaluation and results of same.  Patient does understand need for close outpatient follow-up.  Patient does understand need to seek evaluation urgently if he does have a recurrent head injury since he takes Eliquis. Strict return precautions given and understood.   Additional history obtained:  Additional history obtained from [facility] External records from outside sources obtained and reviewed including prior ED visits and prior Inpatient records.    Lab Tests:  I ordered and personally interpreted labs.  The pertinent results include:  cbc bmp   Imaging Studies ordered:  I ordered imaging studies including  CT head, CT cervical spine, plain films of chest and left foot I independently visualized and interpreted obtained imaging which showed NAD I agree with the radiologist interpretation.   Cardiac Monitoring:  The patient was maintained on a cardiac monitor.  I personally viewed and interpreted the cardiac monitor which showed an underlying rhythm of: nsr   Problem List / ED Course:  Fall   Reevaluation:  After the interventions noted above, I reevaluated the patient and found that they have: improved   Disposition:  After consideration of the diagnostic results and the patients response to treatment, I feel that the patent would benefit from close outpatient follow-up.          Final Clinical Impression(s) / ED Diagnoses Final diagnoses:  Fall, initial encounter    Rx / DC Orders ED Discharge Orders     None         Valarie Merino, MD 01/07/22 [phone]

## 2022-01-07 NOTE — ED Triage Notes (Signed)
Patient brought in by daughter for evaluation of a fall that occurred Saturday. Has laceration on right elbow, complains of intermittent headaches since falling. Patient is alert, oriented, ambulatory, and in no apparent distress at this time. Patient is on eliquis and was referred to ED by kidney transplant coordinator.

## 2022-01-07 NOTE — ED Provider Triage Note (Signed)
Emergency Medicine Provider Triage Evaluation Note  [name] [name] , a 71 y.o. male  was evaluated in triage.  Pt complains of syncopal episode that occurred on Saturday.  Patient states he was using the bathroom and then woke up on the ground.  Daughter at bedside.  Patient injured right elbow and left foot.  Daughter notes that patient has been having numerous syncopal episodes recently.  Patient has a history of a kidney transplant.  He also has a history of meningitis.  He states he is having a headache which feels similar to when he had meningitis.  No fever or chills.  Daughter notes that patient is not compliant with all of his medications.  Denies chest pain or shortness of breath.  Patient is currently on Eliquis.  Daughter notes patient has been unsteady with his gait and has been using a cane since his fall on Saturday.  Review of Systems  Positive: headache Negative: fever  Physical Exam  BP 122/60 (BP Location: Left Arm)   Pulse 70   Temp (!) 97.3 F (36.3 C) (Oral)   Resp 16   SpO2 100%  Gen:   Awake, no distress   Resp:  Normal effort  MSK:   Moves extremities without difficulty  Other:  AAOx4. Normal speech  Medical Decision Making  Medically screening exam initiated at 12:34 PM.  Appropriate orders placed.  [name] [name] was informed that the remainder of the evaluation will be completed by another provider, this initial triage assessment does not replace that evaluation, and the importance of remaining in the ED until their evaluation is complete.  Syncope Labs, EKG, CT head/cervical spine   Suzy Bouchard, PA-C 01/07/22 1236

## 2022-01-07 NOTE — Discharge Instructions (Signed)
Return for any problem.  ?

## 2022-01-13 ENCOUNTER — Emergency Department (HOSPITAL_COMMUNITY)
Admission: EM | Admit: 2022-01-13 | Discharge: 2022-01-13 | Disposition: A | Discharge status: Home / Self Care | Payer: Medicare Other | Attending: Emergency Medicine | Admitting: Emergency Medicine

## 2022-01-13 ENCOUNTER — Other Ambulatory Visit: Payer: Self-pay

## 2022-01-13 ENCOUNTER — Emergency Department (HOSPITAL_COMMUNITY): Payer: Medicare Other

## 2022-01-13 DIAGNOSIS — W01198A Fall on same level from slipping, tripping and stumbling with subsequent striking against other object, initial encounter: Secondary | ICD-10-CM | POA: Diagnosis not present

## 2022-01-13 DIAGNOSIS — R5381 Other malaise: Secondary | ICD-10-CM | POA: Diagnosis not present

## 2022-01-13 DIAGNOSIS — R55 Syncope and collapse: Secondary | ICD-10-CM | POA: Diagnosis not present

## 2022-01-13 DIAGNOSIS — Z7901 Long term (current) use of anticoagulants: Secondary | ICD-10-CM | POA: Diagnosis not present

## 2022-01-13 DIAGNOSIS — E860 Dehydration: Secondary | ICD-10-CM | POA: Diagnosis not present

## 2022-01-13 DIAGNOSIS — M4312 Spondylolisthesis, cervical region: Secondary | ICD-10-CM | POA: Diagnosis not present

## 2022-01-13 DIAGNOSIS — M542 Cervicalgia: Secondary | ICD-10-CM | POA: Diagnosis not present

## 2022-01-13 DIAGNOSIS — S0031XA Abrasion of nose, initial encounter: Secondary | ICD-10-CM | POA: Diagnosis not present

## 2022-01-13 DIAGNOSIS — R531 Weakness: Secondary | ICD-10-CM | POA: Diagnosis not present

## 2022-01-13 DIAGNOSIS — R519 Headache, unspecified: Secondary | ICD-10-CM | POA: Insufficient documentation

## 2022-01-13 DIAGNOSIS — S0992XA Unspecified injury of nose, initial encounter: Secondary | ICD-10-CM | POA: Diagnosis present

## 2022-01-13 LAB — URINALYSIS, ROUTINE W REFLEX MICROSCOPIC
Bacteria, UA: NONE SEEN
Bilirubin Urine: NEGATIVE
Glucose, UA: NEGATIVE mg/dL
Ketones, ur: NEGATIVE mg/dL
Nitrite: NEGATIVE
Protein, ur: NEGATIVE mg/dL
Specific Gravity, Urine: 1.006 (ref 1.005–1.030)
pH: 5 (ref 5.0–8.0)

## 2022-01-13 LAB — CBC WITH DIFFERENTIAL/PLATELET
Abs Immature Granulocytes: 0.06 10*3/uL (ref 0.00–0.07)
Basophils Absolute: 0.1 10*3/uL (ref 0.0–0.1)
Basophils Relative: 1 %
Eosinophils Absolute: 0.2 10*3/uL (ref 0.0–0.5)
Eosinophils Relative: 2 %
HCT: 35.9 % — ABNORMAL LOW (ref 39.0–52.0)
Hemoglobin: 11.4 g/dL — ABNORMAL LOW (ref 13.0–17.0)
Immature Granulocytes: 1 %
Lymphocytes Relative: 12 %
Lymphs Abs: 0.8 10*3/uL (ref 0.7–4.0)
MCH: 29 pg (ref 26.0–34.0)
MCHC: 31.8 g/dL (ref 30.0–36.0)
MCV: 91.3 fL (ref 80.0–100.0)
Monocytes Absolute: 0.5 10*3/uL (ref 0.1–1.0)
Monocytes Relative: 8 %
Neutro Abs: 5.2 10*3/uL (ref 1.7–7.7)
Neutrophils Relative %: 76 %
Platelets: 202 10*3/uL (ref 150–400)
RBC: 3.93 MIL/uL — ABNORMAL LOW (ref 4.22–5.81)
RDW: 13.1 % (ref 11.5–15.5)
WBC: 6.8 10*3/uL (ref 4.0–10.5)
nRBC: 0 % (ref 0.0–0.2)

## 2022-01-13 LAB — COMPREHENSIVE METABOLIC PANEL
ALT: 11 U/L (ref 0–44)
AST: 17 U/L (ref 15–41)
Albumin: 3.7 g/dL (ref 3.5–5.0)
Alkaline Phosphatase: 54 U/L (ref 38–126)
Anion gap: 7 (ref 5–15)
BUN: 53 mg/dL — ABNORMAL HIGH (ref 8–23)
CO2: 25 mmol/L (ref 22–32)
Calcium: 10.1 mg/dL (ref 8.9–10.3)
Chloride: 106 mmol/L (ref 98–111)
Creatinine, Ser: 3.3 mg/dL — ABNORMAL HIGH (ref 0.61–1.24)
GFR, Estimated: 19 mL/min — ABNORMAL LOW (ref >60)
Glucose, Bld: 162 mg/dL — ABNORMAL HIGH (ref 70–99)
Potassium: 4.9 mmol/L (ref 3.5–5.1)
Sodium: 138 mmol/L (ref 135–145)
Total Bilirubin: 0.5 mg/dL (ref 0.3–1.2)
Total Protein: 6.8 g/dL (ref 6.5–8.1)

## 2022-01-13 MED ORDER — SODIUM CHLORIDE 0.9 % IV BOLUS
500.0000 mL | Freq: Once | INTRAVENOUS | Status: AC
  Administered 2022-01-13: 500 mL via INTRAVENOUS

## 2022-01-13 NOTE — Discharge Instructions (Signed)
Your work-up today was overall reassuring but your kidney function was slightly more increased as we discussed.  I suspect you are dehydrated.  Please increase your hydration.  We had a shared decision-making conversation offering admission for the recurrent near syncopal episodes and occasional syncope but you would prefer to follow-up with your PCP.  Please call them tomorrow and rest and stay hydrated.  If any symptoms change or worsen acutely, please return to the nearest Emergency Department.

## 2022-01-13 NOTE — ED Notes (Signed)
Pt ambulated to the bathroom.  

## 2022-01-13 NOTE — ED Triage Notes (Addendum)
Pt BIB EMS due to a fall. [place] was [place] the bank and his knees got weak, he hit the bridge of his nose on a counter. He did not have LOC or have a syncopal. Neg orthostatics per ems. Pt states he has not eaten or drank today. Pt is diabetic. BS 157. Axox4. Vss. Pt is on eliquis.

## 2022-01-13 NOTE — ED Provider Notes (Signed)
[facility] EMERGENCY DEPARTMENT Provider Note   CSN: [id number] Arrival date & time: 01/13/22  1716     History  Chief Complaint  Patient presents with   [name] [name]    [name] [name] is a 71 y.o. male.  The history is provided by the patient and medical records. No language interpreter was used.  Fall This is a recurrent problem. The current episode started 1 to 2 hours ago. The problem occurs rarely. The problem has been resolved. Associated symptoms include headaches (mild). Pertinent negatives include no chest pain, no abdominal pain and no shortness of breath. Nothing aggravates the symptoms. Nothing relieves the symptoms. He has tried nothing for the symptoms. The treatment provided no relief.       Home Medications Prior to Admission medications   Medication Sig Start Date End Date Taking? Authorizing Provider  amLODipine (NORVASC) 2.5 MG tablet Take 2.5 mg by mouth daily. 08/16/21   [provider]  apixaban (ELIQUIS) 5 MG TABS tablet Take 5 mg by mouth 2 (two) times daily.    [provider]  BAQSIMI ONE PACK 3 MG/DOSE POWD Place 1 spray into the nose daily as needed for low blood sugar. 05/13/21   [provider]  carvedilol (COREG) 6.25 MG tablet Take 6.25 mg by mouth 2 (two) times daily. 09/11/20   [provider]  famotidine (PEPCID) 20 MG tablet Take 20 mg by mouth 2 (two) times daily. 09/12/20   [provider]  fluconazole (DIFLUCAN) 200 MG tablet Take 400 mg by mouth daily. 09/23/20   [provider]  folic acid (FOLVITE) 1 MG tablet Take 1 mg by mouth daily. 09/12/20   [provider]  furosemide (LASIX) 40 MG tablet Take 40 mg by mouth daily. 07/19/21   [provider]  insulin aspart (NOVOLOG) 100 UNIT/ML injection Inject 0-15 Units into the skin 4 (four) times daily - after meals and at bedtime. Patient taking differently: Inject 5 Units into the skin 3 (three) times daily. Per  sliding scale 07/07/20   [name], MD  insulin glargine (LANTUS SOLOSTAR) 100 UNIT/ML Solostar Pen Inject 10 Units into the skin at bedtime.    [provider]  Melatonin 10 MG TABS Take 10 mg by mouth at bedtime.    [provider]  predniSONE (DELTASONE) 5 MG tablet Take 10 mg by mouth daily with breakfast.    [provider]  rosuvastatin (CRESTOR) 20 MG tablet TAKE 1 TABLET BY MOUTH EVERY DAY Patient taking differently: Take 20 mg by mouth daily. 08/18/21   [name] [name], [name] Halsted, MD  tacrolimus (PROGRAF) 1 MG capsule Take 1 mg by mouth 2 (two) times daily. 10/31/19   [provider]  thiamine 100 MG tablet Take 100 mg by mouth daily. 09/11/20   [provider]      Allergies    Other, Bag balm  [albolene], and Cold cream    Review of Systems   Review of Systems  Constitutional:  Negative for chills, fatigue and fever.  HENT:  Negative for congestion and nosebleeds.   Eyes:  Negative for visual disturbance.  Respiratory:  Negative for shortness of breath.   Cardiovascular:  Negative for chest pain and palpitations.  Gastrointestinal:  Negative for abdominal pain, constipation, diarrhea, nausea and vomiting.  Genitourinary:  Negative for dysuria and flank pain.  Musculoskeletal:  Negative for back pain, neck pain and neck stiffness.  Skin:  Negative for rash and wound.  Neurological:  Positive for syncope (recetly but near syncoep topday), light-headedness and headaches (mild). Negative for dizziness, weakness and numbness.  Psychiatric/Behavioral:  Negative for agitation and confusion.   All other systems reviewed and are negative.   Physical Exam Updated Vital Signs BP 136/64 (BP Location: Left Arm)   Pulse 63   Temp 97.8 F (36.6 C) (Oral)   Resp 17   Ht '5\' 9"'$  (1.753 m)   Wt 64 kg   SpO2 100%   BMI 20.82 kg/m  Physical Exam Vitals and nursing note reviewed.  Constitutional:      General: He is not in acute  distress.    Appearance: He is well-developed. He is not ill-appearing, toxic-appearing or diaphoretic.  HENT:     Head:      Nose: No congestion or rhinorrhea.     Mouth/Throat:     Pharynx: No oropharyngeal exudate or posterior oropharyngeal erythema.  Eyes:     Extraocular Movements: Extraocular movements intact.     Conjunctiva/sclera: Conjunctivae normal.     Pupils: Pupils are equal, round, and reactive to light.  Neck:     Vascular: No carotid bruit.  Cardiovascular:     Rate and Rhythm: Normal rate and regular rhythm.     Heart sounds: No murmur heard. Pulmonary:     Effort: Pulmonary effort is normal. No respiratory distress.     Breath sounds: Normal breath sounds. No wheezing, rhonchi or rales.  Chest:     Chest wall: No tenderness.  Abdominal:     General: Abdomen is flat.     Palpations: Abdomen is soft.     Tenderness: There is no abdominal tenderness. There is no right CVA tenderness, left CVA tenderness, guarding or rebound.  Musculoskeletal:        General: Tenderness present. No swelling.     Cervical back: Neck supple. No tenderness.     Right lower leg: No edema.     Left lower leg: No edema.  Skin:    General: Skin is warm and dry.     Capillary Refill: Capillary refill takes less than 2 seconds.     Coloration: Skin is not pale.     Findings: No erythema or rash.  Neurological:     General: No focal deficit present.     Mental Status: He is alert.     Sensory: No sensory deficit.     Motor: No weakness.  Psychiatric:        Mood and Affect: Mood normal.     ED Results / Procedures / Treatments   Labs (all labs ordered are listed, but only abnormal results are displayed) Labs Reviewed  CBC WITH DIFFERENTIAL/PLATELET - Abnormal; Notable for the following components:      Result Value   RBC 3.93 (*)    Hemoglobin 11.4 (*)    HCT 35.9 (*)    All other components within normal limits  COMPREHENSIVE METABOLIC PANEL - Abnormal; Notable for the  following components:   Glucose, Bld 162 (*)    BUN 53 (*)    Creatinine, Ser 3.30 (*)    GFR, Estimated 19 (*)    All other components within normal limits  URINALYSIS, ROUTINE W REFLEX MICROSCOPIC - Abnormal; Notable for the following components:   Color, Urine COLORLESS (*)    Hgb urine dipstick SMALL (*)    Leukocytes,Ua TRACE (*)    All other components within normal limits    EKG EKG Interpretation  Date/Time:  Tuesday January 13 2022 17:29:56 EDT Ventricular Rate:  63 PR Interval:  140 QRS Duration: 84 QT Interval:  396 QTC Calculation: 405 R Axis:   68 Text Interpretation: Normal sinus rhythm Normal ECG When compared with ECG of 07-Jan-2022 12:35, PREVIOUS ECG IS PRESENT when compared to prior,  similar appearance. No STEMI Confirmed by Antony Blackbird [phone]) on 01/13/2022 5:45:20 PM  Radiology DG Chest 2 View  Result Date: 01/13/2022 CLINICAL DATA:  Near syncope. EXAM: CHEST - 2 VIEW COMPARISON:  January 07, 2022 FINDINGS: The heart size and mediastinal contours are within normal limits. Both lungs are clear. Multilevel degenerative changes seen throughout the thoracic spine. IMPRESSION: No active cardiopulmonary disease. Electronically Signed   By: Virgina Norfolk M.D.   On: 01/13/2022 21:00   CT HEAD WO CONTRAST (5MM)  Result Date: 01/13/2022 CLINICAL DATA:  Recent syncopal episode with headaches and neck pain, initial encounter EXAM: CT HEAD WITHOUT CONTRAST CT CERVICAL SPINE WITHOUT CONTRAST TECHNIQUE: Multidetector CT imaging of the head and cervical spine was performed following the standard protocol without intravenous contrast. Multiplanar CT image reconstructions of the cervical spine were also generated. RADIATION DOSE REDUCTION: This exam was performed according to the departmental dose-optimization program which includes automated exposure control, adjustment of the mA and/or kV according to patient size and/or use of iterative reconstruction technique. COMPARISON:   None Available. FINDINGS: CT HEAD FINDINGS Brain: No evidence of acute infarction, hemorrhage, hydrocephalus, extra-axial collection or mass lesion/mass effect. Chronic white matter ischemic changes are noted similar to that seen on the prior exam. Vascular: No hyperdense vessel or unexpected calcification. Skull: Normal. Negative for fracture or focal lesion. Sinuses/Orbits: No acute finding. Other: None. CT CERVICAL SPINE FINDINGS Alignment: Mild loss of normal cervical lordosis is noted. Mild degenerative anterolisthesis of C4 on C5 is seen. Skull base and vertebrae: 7 cervical segments are well visualized. Vertebral body height is well maintained. The odontoid is within limits. Facet hypertrophic changes and osteophytic changes are noted throughout cervical spine. No acute fracture or acute facet abnormality is noted. Mild anterolisthesis of C4 on C5 of a degenerative nature is noted. Similar degenerative changes are seen with retrolisthesis of C6 on C7. Soft tissues and spinal canal: Surrounding soft tissue structures are within normal limits. Upper chest: Visualized lung apices are unremarkable. Other: None IMPRESSION: CT of the head: Chronic white matter ischemic changes without acute abnormality. CT of cervical spine: Degenerative change as described without acute abnormality. Electronically Signed   By: Inez Catalina M.D.   On: 01/13/2022 19:09   CT Cervical Spine Wo Contrast  Result Date: 01/13/2022 CLINICAL DATA:  Recent syncopal episode with headaches and neck pain, initial encounter EXAM: CT HEAD WITHOUT CONTRAST CT CERVICAL SPINE WITHOUT CONTRAST TECHNIQUE: Multidetector CT imaging of the head and cervical spine was performed following the standard protocol without intravenous contrast. Multiplanar CT image reconstructions of the cervical spine were also generated. RADIATION DOSE REDUCTION: This exam was performed according to the departmental dose-optimization program which includes automated  exposure control, adjustment of the mA and/or kV according to patient size and/or use of iterative reconstruction technique. COMPARISON:  None Available. FINDINGS: CT HEAD FINDINGS Brain: No evidence of acute infarction, hemorrhage, hydrocephalus, extra-axial collection or mass lesion/mass effect. Chronic white matter ischemic changes are noted similar to that seen on the prior exam. Vascular: No hyperdense vessel or unexpected calcification. Skull: Normal. Negative for fracture or focal lesion. Sinuses/Orbits: No acute finding. Other: None. CT CERVICAL SPINE FINDINGS Alignment: Mild loss of  normal cervical lordosis is noted. Mild degenerative anterolisthesis of C4 on C5 is seen. Skull base and vertebrae: 7 cervical segments are well visualized. Vertebral body height is well maintained. The odontoid is within limits. Facet hypertrophic changes and osteophytic changes are noted throughout cervical spine. No acute fracture or acute facet abnormality is noted. Mild anterolisthesis of C4 on C5 of a degenerative nature is noted. Similar degenerative changes are seen with retrolisthesis of C6 on C7. Soft tissues and spinal canal: Surrounding soft tissue structures are within normal limits. Upper chest: Visualized lung apices are unremarkable. Other: None IMPRESSION: CT of the head: Chronic white matter ischemic changes without acute abnormality. CT of cervical spine: Degenerative change as described without acute abnormality. Electronically Signed   By: Inez Catalina M.D.   On: 01/13/2022 19:09    Procedures Procedures    Medications Ordered in ED Medications  sodium chloride 0.9 % bolus 500 mL (0 mLs Intravenous Stopped 01/13/22 2019)    ED Course/ Medical Decision Making/ A&P                           Medical Decision Making Amount and/or Complexity of Data Reviewed Labs: ordered. Radiology: ordered.    CARNIE BRUEMMER is a 71 y.o. 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  According to patient, he was standing at the bank today and suddenly felt very lightheaded and started having some jerking in extremities.  He reports he fell to his knees and then hit his nose on the counter.  He reports he did not lose consciousness but scraped up his nose.  He said that he was feeling better after it.  He reports that over the last few weeks he has had many syncopal episodes that happen intermittently.  He reports he sometimes has vision that gets dark or gets light but he does report he has passed out several times.  He reports he passed out the other day after urination.  He otherwise denies any chest pain, palpitations, shortness of breath.  Denies any cough, congestion.  Denies any nausea, vomiting, constipation, or diarrhea.  Denies any persistent neurologic complaints and otherwise is feeling well now.  He has not missed any medications.  Denies any other complaints or medication changes.  On exam, lungs clear and chest nontender.  Abdomen nontender.  Patient has abrasion to the bridge of his nose but no active bleeding seen.  No nasal septal hematoma seen on nasal exam and he has mild tenderness on his forehead.  No crepitance.  Pupils are symmetric and reactive, extract movements.  Intact sensation, strength, and pulses in extremities.  Normal finger-nose-finger testing.  Symmetric smile.  Clear speech.  Patient otherwise resting comfortably.  Back and neck were nontender.  He reports occasional neck discomfort but nothing persistent now.  Given his blood thinner use and hitting his head with some headache and neck discomfort, we will get CT of the head and neck.  Due to his recurrent lightheadedness and near syncopal and syncopal episodes, we will get work-up to look for  electrolyte abnormality, occult infection, or some cardiac abnormality.  Anticipate reassessment after work-up to determine disposition.  We will give some fluids for the lightheadedness episode today.  CT imaging showed no acute fractures or significant trauma to the  head or neck.  Labs did show creatinine has slightly worsened.  We discussed that given his kidney transplant this is of concern but he also thinks he could be dehydrated.  He felt much better after some fluids and does not want to have further work-up at this time.  He finally did urinate but did not want to wait for the result and wants to follow-up with his PCP.  He understands to call his transplant and PCP team tomorrow given the recurrence and near syncopal episodes and his worsened kidney function.  Reassessment of his nose did not show any injury that would need laceration repair or glue at this time.  Small abrasion that is well bandaged now.  He thinks tetanus is up-to-date at this time.  Patient will be discharged home after reassuring monitoring for nearly 5 hours and no further symptoms.         Final Clinical Impression(s) / ED Diagnoses Final diagnoses:  Dehydration  Near syncope  Abrasion of nose, initial encounter    Rx / DC Orders ED Discharge Orders     None      Clinical Impression: 1. Dehydration   2. Near syncope   3. Abrasion of nose, initial encounter     Disposition: Discharge  Condition: Good  I have discussed the results, Dx and Tx plan with the pt(& family if present). He/she/they expressed understanding and agree(s) with the plan. Discharge instructions discussed at great length. Strict return precautions discussed and pt &/or family have verbalized understanding of the instructions. No further questions at time of discharge.    Discharge Medication List as of 01/13/2022  9:52 PM      Follow Up: [name] [name], [name] Halsted, MD [place]  [address] [phone]     [facility] EMERGENCY DEPARTMENT [address] [id number] mc [place]       Tyeler Goedken, Gwenyth Allegra, MD 01/14/22 [phone]

## 2022-01-15 DIAGNOSIS — E1165 Type 2 diabetes mellitus with hyperglycemia: Secondary | ICD-10-CM | POA: Diagnosis not present

## 2022-01-16 ENCOUNTER — Ambulatory Visit (INDEPENDENT_AMBULATORY_CARE_PROVIDER_SITE_OTHER): Payer: Medicare Other | Admitting: Internal Medicine

## 2022-01-16 ENCOUNTER — Encounter: Payer: Self-pay | Admitting: Internal Medicine

## 2022-01-16 VITALS — BP 126/73 | HR 76 | Temp 97.6°F | Wt 142.5 lb

## 2022-01-16 DIAGNOSIS — R55 Syncope and collapse: Secondary | ICD-10-CM

## 2022-01-16 DIAGNOSIS — Z09 Encounter for follow-up examination after completed treatment for conditions other than malignant neoplasm: Secondary | ICD-10-CM

## 2022-01-16 DIAGNOSIS — N179 Acute kidney failure, unspecified: Secondary | ICD-10-CM | POA: Diagnosis not present

## 2022-01-16 LAB — COMPREHENSIVE METABOLIC PANEL
ALT: 12 U/L (ref 0–53)
AST: 13 U/L (ref 0–37)
Albumin: 4.1 g/dL (ref 3.5–5.2)
Alkaline Phosphatase: 69 U/L (ref 39–117)
BUN: 53 mg/dL — ABNORMAL HIGH (ref 6–23)
CO2: 28 mEq/L (ref 19–32)
Calcium: 9.8 mg/dL (ref 8.4–10.5)
Chloride: 102 mEq/L (ref 96–112)
Creatinine, Ser: 3.16 mg/dL — ABNORMAL HIGH (ref 0.40–1.50)
GFR: 18.83 mL/min — ABNORMAL LOW (ref >60.00)
Glucose, Bld: 191 mg/dL — ABNORMAL HIGH (ref 70–99)
Potassium: 4.3 mEq/L (ref 3.5–5.1)
Sodium: 139 mEq/L (ref 135–145)
Total Bilirubin: 0.3 mg/dL (ref 0.2–1.2)
Total Protein: 7.5 g/dL (ref 6.0–8.3)

## 2022-01-16 NOTE — Progress Notes (Signed)
Established Patient Office Visit     CC/Reason for Visit: ED follow-up, syncope  HPI: [name] [name] is a 71 y.o. male who is coming in today for the above mentioned reasons. Past Medical History is significant for: Hypertension, hyperlipidemia, insulin-dependent diabetes.  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  He has had 2 emergency department visits.  On August 23 this was after a syncopal episode in his bathroom at home.  He had just finished urinating when he lost consciousness and fell backwards.  He again had another near syncopal episode on 8/29 for which he visited the ED again.  This time he was at the bank had just finished making a deposit when all of a sudden he felt very lightheaded and felt his vision going black.  His knees became weak but he was able to hold onto the counter.  Somebody gave him a chair to sit down on, he slowly recovered and did not have complete syncope during this incident.  During this ED visit he did have some acute kidney injury with a creatinine of 3.3 which is slightly above his baseline of high twos to low threes.  He had a CT scan of the head that was negative for bleeding.  He notes an additional 2 or 3 episodes at home while sitting on his front porch where his vision would go black and would last only a few seconds before returning to normal.  He did not have syncope with these events.  He denies any headache, chest pain, shortness of breath, focal neurologic deficits, jerking of extremities, loss of bowel or bladder continence, abdominal pain.   Past Medical/Surgical History: Past Medical History:  Diagnosis Date   Anemia    as a younger man   Arthritis    hands   Cataract    Chronic kidney disease    "starting to bother me now" (07/28/2012)   Diabetes mellitus     type II   Diarrhea, functional    ED (erectile dysfunction)     Hypertension    Neuropathy    Peripheral neuropathy     Past Surgical History:  Procedure Laterality Date   APPENDECTOMY  1950's   AV FISTULA PLACEMENT Right 05/14/2016   Procedure: ARTERIOVENOUS (AV) FISTULA CREATION- RIGHT ARM;  Surgeon: Serafina Mitchell, MD;  Location: [facility];  Service: Vascular;  Laterality: Right;   COLONOSCOPY     KIDNEY TRANSPLANT Right    PROSTATE SURGERY     UPPER GASTROINTESTINAL ENDOSCOPY      Social History:  reports that he quit smoking about 35 years ago. His smoking use included cigarettes. He has a 1.20 pack-year smoking history. He has never used smokeless tobacco. He reports that he does not currently use alcohol after a past usage of about 1.0 standard drink of alcohol per week. He reports that he does not use drugs.  Allergies: Allergies  Allergen Reactions   Other Rash    Oxyquinoline-white Pet-lanolin   Bag Balm  [Albolene] Rash   Cold Cream Rash    Family History:  Family History  Problem Relation Age of Onset   Colon polyps Mother    Stroke Mother    Diabetes Father    Diabetes Cousin    Prostate cancer Brother    Other Daughter        kidney problems   Esophageal cancer Maternal Aunt  Colon cancer Neg Hx    Stomach cancer Neg Hx    Rectal cancer Neg Hx      Current Outpatient Medications:    amLODipine (NORVASC) 2.5 MG tablet, Take 2.5 mg by mouth daily., Disp: , Rfl:    apixaban (ELIQUIS) 5 MG TABS tablet, Take 5 mg by mouth 2 (two) times daily., Disp: , Rfl:    BAQSIMI ONE PACK 3 MG/DOSE POWD, Place 1 spray into the nose daily as needed for low blood sugar., Disp: , Rfl:    carvedilol (COREG) 6.25 MG tablet, Take 6.25 mg by mouth 2 (two) times daily., Disp: , Rfl:    famotidine (PEPCID) 20 MG tablet, Take 20 mg by mouth 2 (two) times daily., Disp: , Rfl:    fluconazole (DIFLUCAN) 200 MG tablet, Take 400 mg by mouth daily., Disp: , Rfl:    folic acid (FOLVITE) 1 MG tablet, Take 1 mg by mouth daily., Disp: , Rfl:     furosemide (LASIX) 40 MG tablet, Take 40 mg by mouth daily., Disp: , Rfl:    insulin aspart (NOVOLOG) 100 UNIT/ML injection, Inject 0-15 Units into the skin 4 (four) times daily - after meals and at bedtime. (Patient taking differently: Inject 5 Units into the skin 3 (three) times daily. Per sliding scale), Disp: 10 mL, Rfl: 0   insulin glargine (LANTUS SOLOSTAR) 100 UNIT/ML Solostar Pen, Inject 10 Units into the skin at bedtime., Disp: , Rfl:    Melatonin 10 MG TABS, Take 10 mg by mouth at bedtime., Disp: , Rfl:    predniSONE (DELTASONE) 5 MG tablet, Take 10 mg by mouth daily with breakfast., Disp: , Rfl:    rosuvastatin (CRESTOR) 20 MG tablet, TAKE 1 TABLET BY MOUTH EVERY DAY (Patient taking differently: Take 20 mg by mouth daily.), Disp: 90 tablet, Rfl: 1   tacrolimus (PROGRAF) 1 MG capsule, Take 1 mg by mouth 2 (two) times daily., Disp: , Rfl:    thiamine 100 MG tablet, Take 100 mg by mouth daily., Disp: , Rfl:   Review of Systems:  Constitutional: Denies fever, chills, diaphoresis, appetite change and fatigue.  HEENT: Denies photophobia, eye pain, redness, hearing loss, ear pain, congestion, sore throat, rhinorrhea, sneezing, mouth sores, trouble swallowing, neck pain, neck stiffness and tinnitus.   Respiratory: Denies SOB, DOE, cough, chest tightness,  and wheezing.   Cardiovascular: Denies chest pain, palpitations and leg swelling.  Gastrointestinal: Denies nausea, vomiting, abdominal pain, diarrhea, constipation, blood in stool and abdominal distention.  Genitourinary: Denies dysuria, urgency, frequency, hematuria, flank pain and difficulty urinating.  Endocrine: Denies: hot or cold intolerance, sweats, changes in hair or nails, polyuria, polydipsia. Musculoskeletal: Denies myalgias, back pain, joint swelling, arthralgias and gait problem.  Skin: Denies pallor, rash and wound.  Neurological: Denies  seizures,  numbness and headaches.  Hematological: Denies adenopathy. Easy bruising,  personal or family bleeding history  Psychiatric/Behavioral: Denies suicidal ideation, mood changes, confusion, nervousness, sleep disturbance and agitation    Physical Exam: Vitals:   01/16/22 0805 01/16/22 0809  BP: (!) 140/70 126/73  Pulse: 76   Temp: 97.6 F (36.4 C)   TempSrc: Oral   SpO2: 99%   Weight: 142 lb 8 oz (64.6 kg)     Body mass index is 21.04 kg/m.   Constitutional: NAD, calm, comfortable Eyes: PERRL, lids and conjunctivae normal ENMT: Mucous membranes are moist. Respiratory: clear to auscultation bilaterally, no wheezing, no crackles. Normal respiratory effort. No accessory muscle use.  Cardiovascular: Regular rate and rhythm, no  murmurs / rubs / gallops. No extremity edema.  No carotid bruits.  Psychiatric: Normal judgment and insight. Alert and oriented x 3. Normal mood.    Impression and Plan:  Hospital discharge follow-up  Syncope, unspecified syncope type - Plan: ECHOCARDIOGRAM COMPLETE, VAS US CAROTID  Acute renal failure, unspecified acute renal failure type ([place]) - Plan: Comprehensive metabolic panel  -Unclear etiology, with acute kidney injury I wonder about dehydration, he states he has been drinking less than usual.  However he is not hypotensive in office today or during any of his ED visits. -Because he has had several incidences of what sound like amaurosis fugax, I will work this up as a potential TIA as well and will order carotid Dopplers and an echocardiogram. -He has not had any fever, chest pain, headaches, shortness of breath, abdominal pain that would make me think of any cardiac or infectious etiology. -He has an appointment to see his eye doctor later this month. -Have advised him to contact his transplant team at [facility] as well.  Time spent:32 minutes reviewing chart, interviewing and examining patient and formulating plan of care.     Lelon Frohlich, MD [facility]

## 2022-01-23 ENCOUNTER — Telehealth (HOSPITAL_BASED_OUTPATIENT_CLINIC_OR_DEPARTMENT_OTHER): Payer: Self-pay | Admitting: *Deleted

## 2022-01-23 NOTE — Telephone Encounter (Signed)
Called to discuss scheduling the Carotid doppler and Echocardiogram ordered by Dr. Marjean Donna answer and mail box is full

## 2022-02-11 ENCOUNTER — Other Ambulatory Visit: Payer: Self-pay | Admitting: Internal Medicine

## 2022-02-15 DIAGNOSIS — E1165 Type 2 diabetes mellitus with hyperglycemia: Secondary | ICD-10-CM | POA: Diagnosis not present

## 2022-02-17 ENCOUNTER — Other Ambulatory Visit: Payer: Self-pay | Admitting: Internal Medicine

## 2022-02-17 ENCOUNTER — Ambulatory Visit (INDEPENDENT_AMBULATORY_CARE_PROVIDER_SITE_OTHER): Payer: Medicare Other

## 2022-02-17 ENCOUNTER — Encounter: Payer: Self-pay | Admitting: Internal Medicine

## 2022-02-17 DIAGNOSIS — R9430 Abnormal result of cardiovascular function study, unspecified: Secondary | ICD-10-CM

## 2022-02-17 DIAGNOSIS — I6523 Occlusion and stenosis of bilateral carotid arteries: Secondary | ICD-10-CM | POA: Insufficient documentation

## 2022-02-17 DIAGNOSIS — R55 Syncope and collapse: Secondary | ICD-10-CM

## 2022-02-17 LAB — ECHOCARDIOGRAM COMPLETE
Area-P 1/2: 4.49 cm2
S' Lateral: 2.92 cm

## 2022-02-24 DIAGNOSIS — H25813 Combined forms of age-related cataract, bilateral: Secondary | ICD-10-CM | POA: Diagnosis not present

## 2022-02-24 DIAGNOSIS — E119 Type 2 diabetes mellitus without complications: Secondary | ICD-10-CM | POA: Diagnosis not present

## 2022-03-04 DIAGNOSIS — D84821 Immunodeficiency due to drugs: Secondary | ICD-10-CM | POA: Diagnosis not present

## 2022-03-04 DIAGNOSIS — Z4822 Encounter for aftercare following kidney transplant: Secondary | ICD-10-CM | POA: Diagnosis not present

## 2022-03-04 DIAGNOSIS — Z23 Encounter for immunization: Secondary | ICD-10-CM | POA: Diagnosis not present

## 2022-03-04 DIAGNOSIS — B451 Cerebral cryptococcosis: Secondary | ICD-10-CM | POA: Diagnosis not present

## 2022-03-04 DIAGNOSIS — Z940 Kidney transplant status: Secondary | ICD-10-CM | POA: Diagnosis not present

## 2022-03-04 DIAGNOSIS — I10 Essential (primary) hypertension: Secondary | ICD-10-CM | POA: Diagnosis not present

## 2022-03-04 DIAGNOSIS — E1121 Type 2 diabetes mellitus with diabetic nephropathy: Secondary | ICD-10-CM | POA: Diagnosis not present

## 2022-03-04 DIAGNOSIS — Z794 Long term (current) use of insulin: Secondary | ICD-10-CM | POA: Diagnosis not present

## 2022-03-04 DIAGNOSIS — I82432 Acute embolism and thrombosis of left popliteal vein: Secondary | ICD-10-CM | POA: Diagnosis not present

## 2022-03-04 DIAGNOSIS — D849 Immunodeficiency, unspecified: Secondary | ICD-10-CM | POA: Diagnosis not present

## 2022-03-16 DIAGNOSIS — Z794 Long term (current) use of insulin: Secondary | ICD-10-CM | POA: Diagnosis not present

## 2022-03-16 DIAGNOSIS — R918 Other nonspecific abnormal finding of lung field: Secondary | ICD-10-CM | POA: Diagnosis not present

## 2022-03-16 DIAGNOSIS — E1165 Type 2 diabetes mellitus with hyperglycemia: Secondary | ICD-10-CM | POA: Diagnosis not present

## 2022-03-16 DIAGNOSIS — Z87891 Personal history of nicotine dependence: Secondary | ICD-10-CM | POA: Diagnosis not present

## 2022-03-16 DIAGNOSIS — E1129 Type 2 diabetes mellitus with other diabetic kidney complication: Secondary | ICD-10-CM | POA: Diagnosis not present

## 2022-03-16 DIAGNOSIS — T8619 Other complication of kidney transplant: Secondary | ICD-10-CM | POA: Diagnosis not present

## 2022-03-16 DIAGNOSIS — B457 Disseminated cryptococcosis: Secondary | ICD-10-CM | POA: Diagnosis not present

## 2022-03-16 LAB — HEMOGLOBIN A1C: Hemoglobin A1C: 9

## 2022-03-17 ENCOUNTER — Encounter: Payer: Self-pay | Admitting: *Deleted

## 2022-03-17 ENCOUNTER — Ambulatory Visit: Payer: Medicare Other | Attending: Cardiology | Admitting: Cardiology

## 2022-03-17 ENCOUNTER — Encounter: Payer: Self-pay | Admitting: Cardiology

## 2022-03-17 VITALS — BP 124/60 | HR 76 | Ht 69.5 in | Wt 138.0 lb

## 2022-03-17 DIAGNOSIS — I2694 Multiple subsegmental pulmonary emboli without acute cor pulmonale: Secondary | ICD-10-CM | POA: Diagnosis not present

## 2022-03-17 DIAGNOSIS — I2584 Coronary atherosclerosis due to calcified coronary lesion: Secondary | ICD-10-CM

## 2022-03-17 DIAGNOSIS — I2510 Atherosclerotic heart disease of native coronary artery without angina pectoris: Secondary | ICD-10-CM

## 2022-03-17 DIAGNOSIS — R55 Syncope and collapse: Secondary | ICD-10-CM | POA: Diagnosis not present

## 2022-03-17 DIAGNOSIS — R931 Abnormal findings on diagnostic imaging of heart and coronary circulation: Secondary | ICD-10-CM

## 2022-03-17 NOTE — Progress Notes (Signed)
Cardiology Office Note:    Date:  03/17/2022   ID:  [name] [name], DOB 1950/12/25, MRN [id number]  PCP:  [name], [name], MD   [facility] Providers Cardiologist:  None     Referring MD: [name], Estel*   History of Present Illness:    [name] [name] is a 71 y.o. male here for the evaluation of wall motion abnormality of inferior LV wall at the request of Dr. [name].  He had an Echo 02/17/2022 revealing inferior basal hypokinesis, LVEF 50 to 55%. The left ventricle demonstrates regional wall motion abnormalities. There is moderate left ventricular hypertrophy. Left ventricular diastolic parameters were normal. No evidence of mitral stenosis. Moderate mitral annular calcification. There is moderate calcification of the aortic valve. There is moderate thickening of the aortic valve. No evidence of aortic stenosis. He was referred to cardiology for further evaluation of his abnormal echocardiogram.  He also had carotid dopplers 02/2022 which showed low grade bilateral carotid artery stenosis. There was no indication for treatment.  He was last seen by Dr. [name] for post-hospital follow up on 01/16/2022. They reviewed his ED visits. Per Dr. [name], on August 23 he presented to the ED after a syncopal episode in his bathroom at home.  He had just finished urinating when he lost consciousness and fell backwards.  He again had another near syncopal episode on 8/29 and visited the ED again.  This time he was at the bank, had just finished making a deposit when all of a sudden he felt very lightheaded and felt his vision going black. His knees became weak but he was able to hold onto the counter. Somebody gave him a chair to sit down on, he slowly recovered and did not have complete syncope during this incident.  During this ED visit he did have some acute kidney injury with a creatinine of 3.3 which is slightly above his baseline of high twos to  low threes.  He had a CT scan of the head that was negative for bleeding. At his follow-up, he reported 2 or 3 more episodes at home while sitting on his front porch where his vision would go black for a few seconds before normalizing. He did not have syncope with those events. It was thought his incidents sounded like amaurosis fugax, so work-up was pursued as a potential TIA. Carotid dopplers and echocardiogram were ordered; results noted above.  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   Today: He is accompanied by his wife and grandchild. Today he is feeling alright. We reviewed the results of his echocardiogram at length. He denies issues with chest pain.  Of note, he endorses multiple blood clots in the past. He has had 2 PE, and 1 LLE thrombus. Prior to his kidney transplant he was on home dialysis. He once developed a thrombus in the dialysis tube which had to be replaced.   He continues to follow with [facility] regarding his kidney transplant. He reports that his testing at [facility] yesterday went well.  He denies any palpitations, shortness of breath, or peripheral edema. No lightheadedness, headaches, syncope, orthopnea, or PND.   Past Medical History:  Diagnosis Date   Anemia    as a younger man   Arthritis    hands   Cataract    Chronic kidney disease    "starting to bother me now" (07/28/2012)   Diabetes mellitus  type II   Diarrhea, functional    ED (erectile dysfunction)    Hypertension    Neuropathy    Peripheral neuropathy     Past Surgical History:  Procedure Laterality Date   APPENDECTOMY  1950's   AV FISTULA PLACEMENT Right 05/14/2016   Procedure: ARTERIOVENOUS (AV) FISTULA CREATION- RIGHT ARM;  Surgeon: Serafina Mitchell, MD;  Location: [facility] OR;  Service: Vascular;  Laterality: Right;   COLONOSCOPY     KIDNEY TRANSPLANT Right    PROSTATE SURGERY     UPPER  GASTROINTESTINAL ENDOSCOPY      Current Medications: Current Meds  Medication Sig   amLODipine (NORVASC) 2.5 MG tablet Take 2.5 mg by mouth daily.   apixaban (ELIQUIS) 5 MG TABS tablet Take 5 mg by mouth 2 (two) times daily.   BAQSIMI ONE PACK 3 MG/DOSE POWD Place 1 spray into the nose daily as needed for low blood sugar.   carvedilol (COREG) 6.25 MG tablet Take 6.25 mg by mouth 2 (two) times daily.   famotidine (PEPCID) 20 MG tablet Take 20 mg by mouth 2 (two) times daily.   folic acid (FOLVITE) 1 MG tablet Take 1 mg by mouth daily.   furosemide (LASIX) 40 MG tablet Take 40 mg by mouth daily.   insulin aspart (NOVOLOG) 100 UNIT/ML injection Inject 0-15 Units into the skin 4 (four) times daily - after meals and at bedtime. (Patient taking differently: Inject 6 Units into the skin 3 (three) times daily. Per sliding scale)   insulin glargine (LANTUS SOLOSTAR) 100 UNIT/ML Solostar Pen Inject 5 Units into the skin at bedtime.   Melatonin 10 MG TABS Take 10 mg by mouth at bedtime.   predniSONE (DELTASONE) 5 MG tablet Take 10 mg by mouth daily with breakfast.   rosuvastatin (CRESTOR) 20 MG tablet TAKE 1 TABLET BY MOUTH EVERY DAY   tacrolimus (PROGRAF) 1 MG capsule Take 1 mg by mouth 2 (two) times daily.   thiamine 100 MG tablet Take 100 mg by mouth daily.     Allergies:   Other, Bag balm  [albolene], and Cold cream   Social History   Socioeconomic History   Marital status: Married    Spouse name: Not on file   Number of children: 3   Years of education: Not on file   Highest education level: Associate degree: academic program  Occupational History    Employer: [employer]    Comment: [employer]  Tobacco Use   Smoking status: Former    Packs/day: 0.12    Years: 10.00    Total pack years: 1.20    Types: Cigarettes    Quit date: 04/27/1986    Years since quitting: 35.9   Smokeless tobacco: Never   Tobacco comments:    07/28/2012 "quit smoking cigarettes 20-30 yr  ago"  Vaping Use   Vaping Use: Never used  Substance and Sexual Activity   Alcohol use: Not Currently    Alcohol/week: 1.0 standard drink of alcohol    Types: 1 Cans of beer per week    Comment: occ   Drug use: No   Sexual activity: Not Currently    Partners: Male  Other Topics Concern   Not on file  Social History Narrative   Not on file   Social Determinants of Health   Financial Resource Strain: Not on file  Food Insecurity: Not on file  Transportation Needs: Not on file  Physical Activity: Not on file  Stress: Not on file  Social Connections: Not on file     Family History: The patient's family history includes Colon polyps in his mother; Diabetes in his cousin and father; Esophageal cancer in his maternal aunt; Other in his daughter; Prostate cancer in his brother; Stroke in his mother. There is no history of Colon cancer, Stomach cancer, or Rectal cancer.  ROS:   Please see the history of present illness.    All other systems reviewed and are negative.  EKGs/Labs/Other Studies Reviewed:    The following studies were reviewed today:  CT Chest  03/16/2022  ([facility]): HEART/VESSELS:  Normal caliber aorta with moderate calcifications.   Normal heart size. Mild aortic valvular calcifications. Mitral annular  calcification. No pericardial effusion or thickening. Mild coronary artery  calcifications.   MEDIASTINUM/LYMPH NODES:   No suspicious lymph nodes.   VISIBLE ABDOMEN:  Mild renal atrophy.   BONES/SOFT TISSUES:  No suspicious osseous lesions. Mild degenerative changes about the lower  thoracic spine.   IMPRESSION:  1.  Interval decrease of previously biopsied left lower lobe solid  pulmonary nodule measuring 1.6 x 1.9 cm (previously 2.6 x 3.1 cm on  07/07/2020)  2.  Scattered groundglass opacities in the left upper and left lower lobes,  new from 07/15/2020, likely infectious in the acute setting. These findings  can be reevaluated on follow-up imaging.    Bilateral Carotid Doppler  02/17/2022: Summary:  Right Carotid: Velocities in the right ICA are consistent with a 1-39%  stenosis.   Left Carotid: Velocities in the left ICA are consistent with a 1-39%  stenosis.   Vertebrals:  Bilateral vertebral arteries demonstrate antegrade flow.  Subclavians: Normal flow hemodynamics were seen in bilateral subclavian               arteries.   Echo  02/17/2022:  1. Inferior basal hypokinesis . Left ventricular ejection fraction, by  estimation, is 50 to 55%. The left ventricle has low normal function. The  left ventricle demonstrates regional wall motion abnormalities (see  scoring diagram/findings for  description). There is moderate left ventricular hypertrophy. Left  ventricular diastolic parameters were normal.   2. Right ventricular systolic function is normal. The right ventricular  size is normal.   3. The mitral valve is abnormal. Trivial mitral valve regurgitation. No  evidence of mitral stenosis. Moderate mitral annular calcification.   4. The aortic valve is tricuspid. There is moderate calcification of the  aortic valve. There is moderate thickening of the aortic valve. Aortic  valve regurgitation is not visualized. Aortic valve  sclerosis/calcification is present, without any evidence  of aortic stenosis.   5. The inferior vena cava is normal in size with greater than 50%  respiratory variability, suggesting right atrial pressure of 3 mmHg.   MRA Head 09/26/2020: IMPRESSION: No anterior circulation abnormality seen.   Examination begins at the mid cerebellar level. We can see flow in PICA branches on both sides, but the PICA origins are not included. This examination could be repeated if desired. Distal vertebral arteries appear normal. Basilar artery is normal. Anterior inferior cerebellar arteries, superior cerebellar arteries and posterior cerebral arteries are normal.  CTA Chest  09/25/2020: IMPRESSION: Findings  consistent with lower lobe pulmonary emboli worse on the right than the left without evidence of right heart strain.   2.5 cm soft tissue mass lesion in the posterior costophrenic angle on the left new from a prior CT examination obtained in August of 2021. Neoplasm deserves consideration and further workup is recommended.  Aortic Atherosclerosis (ICD10-I70.0).  Echo Stress Test  03/01/2017: Impressions:  - Normal stress echo    No evidence of ischemia    EKG:  EKG is personally reviewed and interpreted. 03/17/2022:  EKG was not ordered.   Recent Labs: 09/09/2021: Magnesium 1.8 01/13/2022: Hemoglobin 11.4; Platelets 202 01/16/2022: ALT 12; BUN 53; Creatinine, Ser 3.16; Potassium 4.3; Sodium 139   Recent Lipid Panel    Component Value Date/Time   CHOL 165 06/03/2021 1118   TRIG 206.0 (H) 06/03/2021 1118   HDL 48.40 06/03/2021 1118   CHOLHDL 3 06/03/2021 1118   VLDL 41.2 (H) 06/03/2021 1118   LDLCALC UNABLE TO CALCULATE IF TRIGLYCERIDE OVER 400 mg/dL 09/27/2020 0112   LDLDIRECT 68.0 06/03/2021 1118     Risk Assessment/Calculations:          Physical Exam:    VS:  BP 124/60   Pulse 76   Ht 5' 9.5" (1.765 m)   Wt 138 lb (62.6 kg)   SpO2 98%   BMI 20.09 kg/m     Wt Readings from Last 3 Encounters:  03/17/22 138 lb (62.6 kg)  01/16/22 142 lb 8 oz (64.6 kg)  01/13/22 141 lb (64 kg)     GEN: Well nourished, well developed in no acute distress HEENT: Normal NECK: No JVD; No carotid bruits LYMPHATICS: No lymphadenopathy CARDIAC: RRR, no murmurs, rubs, gallops RESPIRATORY:  Clear to auscultation without rales, wheezing or rhonchi  ABDOMEN: Soft, non-tender, non-distended MUSCULOSKELETAL:  No edema; No deformity  SKIN: Warm and dry NEUROLOGIC:  Alert and oriented x 3 PSYCHIATRIC:  Normal affect   ASSESSMENT:    1. Syncope and collapse   2. Coronary artery calcification   3. Abnormal echocardiogram   4. Multiple subsegmental pulmonary emboli without acute  cor pulmonale ([name])    PLAN:    In order of problems listed above:  Abnormal echocardiogram with inferior basal wall motion abnormality low normal ejection fraction 50 to 55% - Coronary artery calcification noted on CT scan - We will go ahead and check a pharmacologic stress test to ensure that he does not have any evidence of high risk ischemia present. -Prior episode of syncope  History of PE - Currently on Eliquis 5 mg twice a day.  No changes in medication management.  He is followed at [facility] as well.  Renal transplant 10/09/2019  - Antirejection medications noted.  Tacrolimus.  Recent good report at [facility].  Used to have peritoneal dialysis. -In review of transplant notes from [place] from recent office visit 03/04/2022-post-transplant course that was complicated by delayed graft function requiring dialysis with renal recovery, Klebsiella urosepsis, prolonged admission for disseminated cryptococcal infection treated with Diflucan, followed with Txp ID, right inferior cerebral stroke and bibasilar PE discharged on apixaban with then finding of DVT of his left popliteal vein in 10/2020 which prompted an additional admission which was complicated by AKI and hyperglycemia. In addition, he has had an admission in 05/2021 due to hypoglycemia and generalized weakness, worsening kidney function with serum creatinine in mid 2s-3s  Shared Decision Making/Informed Consent The risks [chest pain, shortness of breath, cardiac arrhythmias, dizziness, blood pressure fluctuations, myocardial infarction, stroke/transient ischemic attack, nausea, vomiting, allergic reaction, radiation exposure, metallic taste sensation and life-threatening complications (estimated to be 1 in 10,000)], benefits (risk stratification, diagnosing coronary artery disease, treatment guidance) and alternatives of a nuclear stress test were discussed in detail with [name] [name] and he agrees to proceed.   Follow-up: We will follow-up with  results of  testing  Medication Adjustments/Labs and Tests Ordered: Current medicines are reviewed at length with the patient today.  Concerns regarding medicines are outlined above.   Orders Placed This Encounter  Procedures   MYOCARDIAL PERFUSION IMAGING   No orders of the defined types were placed in this encounter.  Patient Instructions  Medication Instructions:  The current medical regimen is effective;  continue present plan and medications.  *If you need a refill on your cardiac medications before your next appointment, please call your pharmacy*  Testing/Procedures: Your physician has requested that you have a lexiscan myoview. For further information please visit [email or web address]. Please follow instruction sheet, as given.  Follow-Up: At [facility], you and your health needs are our priority.  As part of our continuing mission to provide you with exceptional heart care, we have created designated Provider Care Teams.  These Care Teams include your primary Cardiologist (physician) and Advanced Practice Providers (APPs -  Physician Assistants and Nurse Practitioners) who all work together to provide you with the care you need, when you need it.  We recommend signing up for the patient portal called "MyChart".  Sign up information is provided on this After Visit Summary.  MyChart is used to connect with patients for Virtual Visits (Telemedicine).  Patients are able to view lab/test results, encounter notes, upcoming appointments, etc.  Non-urgent messages can be sent to your provider as well.   To learn more about what you can do with MyChart, go to [email or web address].    Your next appointment:   Follow up will be based on the results of the above testing.   Important Information About Sugar         I,Mathew Stumpf,acting as a scribe for Candee Furbish, MD.,have documented all relevant documentation on the behalf of Candee Furbish, MD,as directed by  Candee Furbish, MD while in the presence of Candee Furbish, MD.  I, Candee Furbish, MD, have reviewed all documentation for this visit. The documentation on 03/17/22 for the exam, diagnosis, procedures, and orders are all accurate and complete.   Signed, Candee Furbish, MD  03/17/2022 10:41 AM    [facility]

## 2022-03-17 NOTE — Patient Instructions (Signed)
Medication Instructions:  The current medical regimen is effective;  continue present plan and medications.  *If you need a refill on your cardiac medications before your next appointment, please call your pharmacy*  Testing/Procedures: Your physician has requested that you have a lexiscan myoview. For further information please visit [email or web address]. Please follow instruction sheet, as given.  Follow-Up: At [facility], you and your health needs are our priority.  As part of our continuing mission to provide you with exceptional heart care, we have created designated Provider Care Teams.  These Care Teams include your primary Cardiologist (physician) and Advanced Practice Providers (APPs -  Physician Assistants and Nurse Practitioners) who all work together to provide you with the care you need, when you need it.  We recommend signing up for the patient portal called "MyChart".  Sign up information is provided on this After Visit Summary.  MyChart is used to connect with patients for Virtual Visits (Telemedicine).  Patients are able to view lab/test results, encounter notes, upcoming appointments, etc.  Non-urgent messages can be sent to your provider as well.   To learn more about what you can do with MyChart, go to [email or web address].    Your next appointment:   Follow up will be based on the results of the above testing.   Important Information About Sugar

## 2022-03-19 ENCOUNTER — Telehealth (HOSPITAL_COMMUNITY): Payer: Self-pay

## 2022-03-19 NOTE — Telephone Encounter (Signed)
Detailed instructions left on the patient's answering machine. Asked to call back with any questions. S.Bryant Lipps EMTP 

## 2022-03-24 ENCOUNTER — Encounter (HOSPITAL_COMMUNITY): Payer: Medicare Other

## 2022-03-30 ENCOUNTER — Ambulatory Visit (HOSPITAL_COMMUNITY): Payer: Medicare Other | Attending: Cardiology

## 2022-03-30 DIAGNOSIS — R55 Syncope and collapse: Secondary | ICD-10-CM | POA: Insufficient documentation

## 2022-03-30 DIAGNOSIS — I2510 Atherosclerotic heart disease of native coronary artery without angina pectoris: Secondary | ICD-10-CM | POA: Insufficient documentation

## 2022-03-30 DIAGNOSIS — R931 Abnormal findings on diagnostic imaging of heart and coronary circulation: Secondary | ICD-10-CM | POA: Diagnosis not present

## 2022-03-30 DIAGNOSIS — I2584 Coronary atherosclerosis due to calcified coronary lesion: Secondary | ICD-10-CM | POA: Diagnosis not present

## 2022-03-30 LAB — MYOCARDIAL PERFUSION IMAGING
Estimated workload: 4.9
Exercise duration (min): 3 min
Exercise duration (sec): 19 s
LV dias vol: 90 mL (ref 62–150)
LV sys vol: 46 mL
MPHR: 147 {beats}/min
Nuc Stress EF: 48 %
Peak HR: 94 {beats}/min
Percent HR: 64 %
RPE: 19
Rest HR: 69 {beats}/min
Rest Nuclear Isotope Dose: 10.3 mCi
SDS: 0
SRS: 0
SSS: 0
ST Depression (mm): 0 mm
Stress Nuclear Isotope Dose: 32.7 mCi
TID: 0.91

## 2022-03-30 MED ORDER — TECHNETIUM TC 99M TETROFOSMIN IV KIT
32.7000 | PACK | Freq: Once | INTRAVENOUS | Status: AC | PRN
  Administered 2022-03-30: 32.7 via INTRAVENOUS

## 2022-03-30 MED ORDER — REGADENOSON 0.4 MG/5ML IV SOLN
0.4000 mg | Freq: Once | INTRAVENOUS | Status: AC
  Administered 2022-03-30: 0.4 mg via INTRAVENOUS

## 2022-03-30 MED ORDER — REGADENOSON 0.4 MG/5ML IV SOLN: 0.4000 mg | Freq: Once | INTRAVENOUS | Status: DC

## 2022-03-30 MED ORDER — TECHNETIUM TC 99M TETROFOSMIN IV KIT
10.3000 | PACK | Freq: Once | INTRAVENOUS | Status: AC | PRN
  Administered 2022-03-30: 10.3 via INTRAVENOUS

## 2022-04-01 ENCOUNTER — Telehealth: Payer: Self-pay | Admitting: Cardiology

## 2022-04-01 NOTE — Telephone Encounter (Signed)
Returned call to patient to discuss stress test results.  Per Dr. Marlou Porch: Overall reassuring low risk stress test. Continue with medical management Candee Furbish, MD  Patient verbalized understanding and expressed appreciation for call.

## 2022-04-01 NOTE — Telephone Encounter (Signed)
Patient is returning call to discuss stress test results. 

## 2022-04-08 ENCOUNTER — Ambulatory Visit: Payer: Medicare Other | Admitting: Podiatry

## 2022-04-08 ENCOUNTER — Encounter: Payer: Self-pay | Admitting: Podiatry

## 2022-04-08 DIAGNOSIS — Z992 Dependence on renal dialysis: Secondary | ICD-10-CM

## 2022-04-08 DIAGNOSIS — M79675 Pain in left toe(s): Secondary | ICD-10-CM | POA: Diagnosis not present

## 2022-04-08 DIAGNOSIS — B351 Tinea unguium: Secondary | ICD-10-CM | POA: Diagnosis not present

## 2022-04-08 DIAGNOSIS — M79674 Pain in right toe(s): Secondary | ICD-10-CM

## 2022-04-08 DIAGNOSIS — E1142 Type 2 diabetes mellitus with diabetic polyneuropathy: Secondary | ICD-10-CM

## 2022-04-08 DIAGNOSIS — D689 Coagulation defect, unspecified: Secondary | ICD-10-CM

## 2022-04-08 DIAGNOSIS — I739 Peripheral vascular disease, unspecified: Secondary | ICD-10-CM

## 2022-04-08 DIAGNOSIS — N185 Chronic kidney disease, stage 5: Secondary | ICD-10-CM

## 2022-04-08 DIAGNOSIS — N186 End stage renal disease: Secondary | ICD-10-CM | POA: Diagnosis not present

## 2022-04-08 NOTE — Progress Notes (Signed)
This patient returns to my office for at risk foot care.  This patient requires this care by a professional since this patient will be at risk due to having ESRD and diabetes. Patient has not been seen in over 6 months.  This patient is unable to cut nails himself since the patient cannot reach his nails.These nails are painful walking and wearing shoes.  This patient presents for at risk foot care today.   General Appearance  Alert, conversant and in no acute stress.  Vascular  Dorsalis pedis and posterior tibial  pulses are  weakly palpable  bilaterally.  Capillary return is within normal limits  bilaterally. Cold feet   bilaterally.  Neurologic  Senn-Weinstein monofilament wire test within normal limits  bilaterally. Muscle power within normal limits bilaterally.  Nails Thick disfigured discolored nails with subungual debris  from hallux to fifth toes bilaterally. No evidence of bacterial infection or drainage bilaterally.  Orthopedic  No limitations of motion  feet .  No crepitus or effusions noted.  No bony pathology or digital deformities noted.  Plantar flexed met heads 2-4  B/L.   Skin  normotropic skin noted bilaterally.  No signs of infections or ulcers noted.     Onychomycosis  Pain in right toes  Pain in left toes    Consent was obtained for treatment procedures.   Mechanical debridement of nails 1-5  bilaterally performed with a nail nipper.  Filed with dremel without incident. Ordered vascular studies for this patient.   Return office visit   3 months                   Told patient to return for periodic foot care and evaluation due to potential at risk complications.   Gardiner Barefoot DPM

## 2022-04-17 DIAGNOSIS — Z86711 Personal history of pulmonary embolism: Secondary | ICD-10-CM | POA: Diagnosis not present

## 2022-04-17 DIAGNOSIS — Z86718 Personal history of other venous thrombosis and embolism: Secondary | ICD-10-CM | POA: Diagnosis not present

## 2022-04-17 DIAGNOSIS — D84821 Immunodeficiency due to drugs: Secondary | ICD-10-CM | POA: Diagnosis not present

## 2022-04-17 DIAGNOSIS — Z7901 Long term (current) use of anticoagulants: Secondary | ICD-10-CM | POA: Diagnosis not present

## 2022-04-17 DIAGNOSIS — Z940 Kidney transplant status: Secondary | ICD-10-CM | POA: Diagnosis not present

## 2022-04-27 ENCOUNTER — Other Ambulatory Visit: Payer: Self-pay | Admitting: Internal Medicine

## 2022-04-28 ENCOUNTER — Telehealth (HOSPITAL_COMMUNITY): Payer: Self-pay

## 2022-04-28 NOTE — Telephone Encounter (Signed)
Attempted to reach patient for scheduling vascular ultrasound, unable to contact, order to be canceled in 7 days if not contacted by patient for scheduling.  04/28/22 unable to leave message /dd 04/21/22 unable to leave message /dd 04/13/22 lvm /dd

## 2022-05-04 DIAGNOSIS — Z940 Kidney transplant status: Secondary | ICD-10-CM | POA: Diagnosis not present

## 2022-05-04 DIAGNOSIS — D849 Immunodeficiency, unspecified: Secondary | ICD-10-CM | POA: Diagnosis not present

## 2022-05-19 DIAGNOSIS — E1165 Type 2 diabetes mellitus with hyperglycemia: Secondary | ICD-10-CM | POA: Diagnosis not present

## 2022-05-19 DIAGNOSIS — D849 Immunodeficiency, unspecified: Secondary | ICD-10-CM | POA: Diagnosis not present

## 2022-05-19 DIAGNOSIS — Z940 Kidney transplant status: Secondary | ICD-10-CM | POA: Diagnosis not present

## 2022-05-20 DIAGNOSIS — I87009 Postthrombotic syndrome without complications of unspecified extremity: Secondary | ICD-10-CM | POA: Diagnosis not present

## 2022-05-20 DIAGNOSIS — I739 Peripheral vascular disease, unspecified: Secondary | ICD-10-CM | POA: Diagnosis not present

## 2022-05-20 DIAGNOSIS — I70213 Atherosclerosis of native arteries of extremities with intermittent claudication, bilateral legs: Secondary | ICD-10-CM | POA: Diagnosis not present

## 2022-05-20 DIAGNOSIS — Z7901 Long term (current) use of anticoagulants: Secondary | ICD-10-CM | POA: Diagnosis not present

## 2022-05-28 DIAGNOSIS — Z940 Kidney transplant status: Secondary | ICD-10-CM | POA: Diagnosis not present

## 2022-05-28 DIAGNOSIS — D849 Immunodeficiency, unspecified: Secondary | ICD-10-CM | POA: Diagnosis not present

## 2022-06-04 ENCOUNTER — Encounter: Payer: Medicare Other | Admitting: Internal Medicine

## 2022-06-04 ENCOUNTER — Encounter: Payer: Self-pay | Admitting: Internal Medicine

## 2022-06-09 ENCOUNTER — Ambulatory Visit: Payer: Medicare Other | Admitting: Internal Medicine

## 2022-07-07 DIAGNOSIS — I10 Essential (primary) hypertension: Secondary | ICD-10-CM | POA: Diagnosis not present

## 2022-07-07 DIAGNOSIS — Z79899 Other long term (current) drug therapy: Secondary | ICD-10-CM | POA: Diagnosis not present

## 2022-07-07 DIAGNOSIS — H269 Unspecified cataract: Secondary | ICD-10-CM | POA: Diagnosis not present

## 2022-07-07 DIAGNOSIS — Z940 Kidney transplant status: Secondary | ICD-10-CM | POA: Diagnosis not present

## 2022-07-07 DIAGNOSIS — Z86718 Personal history of other venous thrombosis and embolism: Secondary | ICD-10-CM | POA: Diagnosis not present

## 2022-07-07 DIAGNOSIS — Z23 Encounter for immunization: Secondary | ICD-10-CM | POA: Diagnosis not present

## 2022-07-07 DIAGNOSIS — E1136 Type 2 diabetes mellitus with diabetic cataract: Secondary | ICD-10-CM | POA: Diagnosis not present

## 2022-07-07 DIAGNOSIS — H40013 Open angle with borderline findings, low risk, bilateral: Secondary | ICD-10-CM | POA: Diagnosis not present

## 2022-07-07 DIAGNOSIS — Z4822 Encounter for aftercare following kidney transplant: Secondary | ICD-10-CM | POA: Diagnosis not present

## 2022-07-07 DIAGNOSIS — M542 Cervicalgia: Secondary | ICD-10-CM | POA: Diagnosis not present

## 2022-07-07 DIAGNOSIS — R2240 Localized swelling, mass and lump, unspecified lower limb: Secondary | ICD-10-CM | POA: Diagnosis not present

## 2022-07-07 DIAGNOSIS — D84821 Immunodeficiency due to drugs: Secondary | ICD-10-CM | POA: Diagnosis not present

## 2022-07-07 DIAGNOSIS — Z87891 Personal history of nicotine dependence: Secondary | ICD-10-CM | POA: Diagnosis not present

## 2022-07-14 ENCOUNTER — Ambulatory Visit: Payer: Medicare Other

## 2022-07-14 ENCOUNTER — Ambulatory Visit (INDEPENDENT_AMBULATORY_CARE_PROVIDER_SITE_OTHER): Payer: Medicare Other | Admitting: Podiatry

## 2022-07-14 DIAGNOSIS — L97512 Non-pressure chronic ulcer of other part of right foot with fat layer exposed: Secondary | ICD-10-CM

## 2022-07-14 DIAGNOSIS — I999 Unspecified disorder of circulatory system: Secondary | ICD-10-CM

## 2022-07-14 DIAGNOSIS — E1142 Type 2 diabetes mellitus with diabetic polyneuropathy: Secondary | ICD-10-CM | POA: Diagnosis not present

## 2022-07-14 MED ORDER — DOXYCYCLINE HYCLATE 100 MG PO TABS: 100.0000 mg | ORAL_TABLET | Freq: Two times a day (BID) | ORAL | 0 refills | Status: AC

## 2022-07-14 NOTE — Progress Notes (Signed)
Subjective:  Patient ID: [name] [name], male    DOB: December 09, 1950,  MRN: [id number]  Chief Complaint  Patient presents with   Routine Foot Care     72 y.o. male presents with the above complaint.  Patient presents with right great toe ulceration.  Patient is again unaware is progressive gotten worse.  He is known to Dr. Prudence Davidson for routine footcare.  He wanted me to get evaluated.  He is a diabetic.  There are some circulation issues he has history of getting circulation test done at [facility].  Unable unable to see the records.   Review of Systems: Negative except as noted in the HPI. Denies N/V/F/Ch.  Past Medical History:  Diagnosis Date   Anemia    as a younger man   Arthritis    hands   Cataract    Chronic kidney disease    "starting to bother me now" (07/28/2012)   Diabetes mellitus     type II   Diarrhea, functional    ED (erectile dysfunction)    Hypertension    Neuropathy    Peripheral neuropathy     Current Outpatient Medications:    doxycycline (VIBRA-TABS) 100 MG tablet, Take 1 tablet (100 mg total) by mouth 2 (two) times daily., Disp: 60 tablet, Rfl: 0   amLODipine (NORVASC) 2.5 MG tablet, Take 2.5 mg by mouth daily., Disp: , Rfl:    apixaban (ELIQUIS) 5 MG TABS tablet, Take 5 mg by mouth 2 (two) times daily., Disp: , Rfl:    BAQSIMI ONE PACK 3 MG/DOSE POWD, Place 1 spray into the nose daily as needed for low blood sugar., Disp: , Rfl:    carvedilol (COREG) 6.25 MG tablet, Take 6.25 mg by mouth 2 (two) times daily., Disp: , Rfl:    famotidine (PEPCID) 20 MG tablet, Take 20 mg by mouth 2 (two) times daily., Disp: , Rfl:    folic acid (FOLVITE) 1 MG tablet, Take 1 mg by mouth daily., Disp: , Rfl:    furosemide (LASIX) 40 MG tablet, Take 40 mg by mouth daily., Disp: , Rfl:    insulin aspart (NOVOLOG) 100 UNIT/ML injection, Inject 0-15 Units into the skin 4 (four) times daily - after meals and at bedtime. (Patient taking differently: Inject 6 Units into the skin 3  (three) times daily. Per sliding scale), Disp: 10 mL, Rfl: 0   insulin glargine (LANTUS SOLOSTAR) 100 UNIT/ML Solostar Pen, Inject 5 Units into the skin at bedtime., Disp: , Rfl:    Melatonin 10 MG TABS, Take 10 mg by mouth at bedtime., Disp: , Rfl:    predniSONE (DELTASONE) 5 MG tablet, Take 10 mg by mouth daily with breakfast., Disp: , Rfl:    rosuvastatin (CRESTOR) 20 MG tablet, TAKE 1 TABLET BY MOUTH EVERY DAY, Disp: 90 tablet, Rfl: 0   tacrolimus (PROGRAF) 1 MG capsule, Take 1 mg by mouth 2 (two) times daily., Disp: , Rfl:    thiamine 100 MG tablet, Take 100 mg by mouth daily., Disp: , Rfl:   Social History   Tobacco Use  Smoking Status Former   Packs/day: 0.12   Years: 10.00   Total pack years: 1.20   Types: Cigarettes   Quit date: 04/27/1986   Years since quitting: 36.2  Smokeless Tobacco Never  Tobacco Comments   07/28/2012 "quit smoking cigarettes 20-30 yr ago"    Allergies  Allergen Reactions   Other Rash    Oxyquinoline-white Pet-lanolin   Bag Balm  [Albolene] Rash  Cold Cream Rash   Objective:  There were no vitals filed for this visit. There is no height or weight on file to calculate BMI. Constitutional Well developed. Well nourished.  Vascular Dorsalis pedis pulses nonpalpable bilaterally. Posterior tibial pulses nonpalpable bilaterally. Capillary refill diminished to all digits.  No cyanosis or clubbing noted. Pedal hair growth normal.  Neurologic Normal speech. Oriented to person, place, and time. Epicritic sensation to light touch grossly present bilaterally.  Dermatologic Nails well groomed and normal in appearance. No open wounds. No skin lesions.  Orthopedic: Right hallux distal tip ulceration with fat layer exposed does not probe down to bone.  No purulent drainage noted.  No erythema noted.   Radiographs: 3 views of skeletally mature the right foot: No gross osteomyelitic changes noted.  Slight cortical thinning may be appreciated on the  x-ray. Assessment:   1. Vascular abnormality   2. Diabetic polyneuropathy associated with type 2 diabetes mellitus ([place])   3. Chronic toe ulcer, right, with fat layer exposed ([place])    Plan:  Patient was evaluated and treated and all questions answered.  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 -Minimal debridement was carried out as patient has underlying vascular flow issues due to darkening of the skin in the setting of 1 palpable pulses -ABIs PVRs were ordered -Betadine wet-to-dry dressing changes daily -Doxycycline for 30 days for skin and soft tissue prophylaxis  No follow-ups on file.   Right great toe ulceration with fat layer exposed doxycycline ABIs PVRs Betadine wet-to-dry  Patient has seen a vascular doctor in [place] by the name of Judith Blonder.

## 2022-07-16 DIAGNOSIS — Z940 Kidney transplant status: Secondary | ICD-10-CM | POA: Diagnosis not present

## 2022-07-16 DIAGNOSIS — D849 Immunodeficiency, unspecified: Secondary | ICD-10-CM | POA: Diagnosis not present

## 2022-07-17 DIAGNOSIS — E1122 Type 2 diabetes mellitus with diabetic chronic kidney disease: Secondary | ICD-10-CM | POA: Diagnosis not present

## 2022-07-17 DIAGNOSIS — E1140 Type 2 diabetes mellitus with diabetic neuropathy, unspecified: Secondary | ICD-10-CM | POA: Diagnosis not present

## 2022-07-17 DIAGNOSIS — Z87891 Personal history of nicotine dependence: Secondary | ICD-10-CM | POA: Diagnosis not present

## 2022-07-17 DIAGNOSIS — Z7901 Long term (current) use of anticoagulants: Secondary | ICD-10-CM | POA: Diagnosis not present

## 2022-07-17 DIAGNOSIS — I120 Hypertensive chronic kidney disease with stage 5 chronic kidney disease or end stage renal disease: Secondary | ICD-10-CM | POA: Diagnosis not present

## 2022-07-17 DIAGNOSIS — H25812 Combined forms of age-related cataract, left eye: Secondary | ICD-10-CM | POA: Diagnosis not present

## 2022-07-17 DIAGNOSIS — Z794 Long term (current) use of insulin: Secondary | ICD-10-CM | POA: Diagnosis not present

## 2022-07-17 DIAGNOSIS — E1136 Type 2 diabetes mellitus with diabetic cataract: Secondary | ICD-10-CM | POA: Diagnosis not present

## 2022-07-17 DIAGNOSIS — H2589 Other age-related cataract: Secondary | ICD-10-CM | POA: Diagnosis not present

## 2022-07-17 DIAGNOSIS — Z940 Kidney transplant status: Secondary | ICD-10-CM | POA: Diagnosis not present

## 2022-07-17 DIAGNOSIS — N186 End stage renal disease: Secondary | ICD-10-CM | POA: Diagnosis not present

## 2022-07-28 ENCOUNTER — Ambulatory Visit: Payer: Medicare Other | Admitting: Podiatry

## 2022-07-29 ENCOUNTER — Other Ambulatory Visit: Payer: Self-pay | Admitting: Internal Medicine

## 2022-07-29 DIAGNOSIS — D849 Immunodeficiency, unspecified: Secondary | ICD-10-CM | POA: Diagnosis not present

## 2022-07-29 DIAGNOSIS — Z940 Kidney transplant status: Secondary | ICD-10-CM | POA: Diagnosis not present

## 2022-07-31 ENCOUNTER — Telehealth (HOSPITAL_COMMUNITY): Payer: Self-pay

## 2022-08-04 ENCOUNTER — Ambulatory Visit (INDEPENDENT_AMBULATORY_CARE_PROVIDER_SITE_OTHER): Payer: Medicare Other | Admitting: Podiatry

## 2022-08-04 ENCOUNTER — Encounter: Payer: Self-pay | Admitting: Podiatry

## 2022-08-04 DIAGNOSIS — I999 Unspecified disorder of circulatory system: Secondary | ICD-10-CM

## 2022-08-04 DIAGNOSIS — E1142 Type 2 diabetes mellitus with diabetic polyneuropathy: Secondary | ICD-10-CM

## 2022-08-04 DIAGNOSIS — L97512 Non-pressure chronic ulcer of other part of right foot with fat layer exposed: Secondary | ICD-10-CM | POA: Diagnosis not present

## 2022-08-04 NOTE — Progress Notes (Signed)
Subjective:  Patient ID: [name] [name], male    DOB: [birth year],  MRN: [id number]  Chief Complaint  Patient presents with   Wound Check    72 y.o. male presents with the above complaint.  Patient presents with right great toe ulceration.  Patient states that he is doing okay.  He looks a lot better feels a lot better.  He wanted discuss next treatment options he did not get a phone call from vascular he may have missed   Review of Systems: Negative except as noted in the HPI. Denies N/V/F/Ch.  Past Medical History:  Diagnosis Date   Anemia    as a younger man   Arthritis    hands   Cataract    Chronic kidney disease    "starting to bother me now" (07/28/2012)   Diabetes mellitus     type II   Diarrhea, functional    ED (erectile dysfunction)    Hypertension    Neuropathy    Peripheral neuropathy     Current Outpatient Medications:    Alcohol Swabs (PHARMACIST CHOICE ALCOHOL) PADS, SMARTSIG:1 Each Topical 4 Times Daily, Disp: , Rfl:    aspirin 81 MG chewable tablet, Chew by mouth., Disp: , Rfl:    calcitRIOL (ROCALTROL) 0.5 MCG capsule, Take by mouth., Disp: , Rfl:    COMFORT EZ PEN NEEDLES 32G X 4 MM MISC, , Disp: , Rfl:    Heparin Sod, Pork, Lock Flush (HEPARIN FLUSH) 10 UNIT/ML SOLN injection, , Disp: , Rfl:    HUMALOG KWIKPEN 100 UNIT/ML KwikPen, Inject into the skin., Disp: , Rfl:    ketorolac (ACULAR) 0.5 % ophthalmic solution, Apply to eye., Disp: , Rfl:    Methoxy PEG-Epoetin Beta (MIRCERA) 50 MCG/0.3ML SOSY, Inject into the skin., Disp: , Rfl:    ofloxacin (OCUFLOX) 0.3 % ophthalmic solution, Place into the left eye., Disp: , Rfl:    prednisoLONE acetate (PRED FORTE) 1 % ophthalmic suspension, Place into the left eye., Disp: , Rfl:    sevelamer carbonate (RENVELA) 800 MG tablet, , Disp: , Rfl:    telmisartan (MICARDIS) 20 MG tablet, Take by mouth., Disp: , Rfl:    amLODipine (NORVASC) 2.5 MG tablet, Take 2.5 mg by mouth daily., Disp: , Rfl:    apixaban  (ELIQUIS) 5 MG TABS tablet, Take 5 mg by mouth 2 (two) times daily., Disp: , Rfl:    BAQSIMI ONE PACK 3 MG/DOSE POWD, Place 1 spray into the nose daily as needed for low blood sugar., Disp: , Rfl:    carvedilol (COREG) 6.25 MG tablet, Take 6.25 mg by mouth 2 (two) times daily., Disp: , Rfl:    doxycycline (VIBRA-TABS) 100 MG tablet, Take 1 tablet (100 mg total) by mouth 2 (two) times daily., Disp: 60 tablet, Rfl: 0   enoxaparin (LOVENOX) 60 MG/0.6ML injection, Inject into the skin., Disp: , Rfl:    famotidine (PEPCID) 20 MG tablet, Take 20 mg by mouth 2 (two) times daily., Disp: , Rfl:    folic acid (FOLVITE) 1 MG tablet, Take 1 mg by mouth daily., Disp: , Rfl:    furosemide (LASIX) 40 MG tablet, Take 40 mg by mouth daily., Disp: , Rfl:    insulin aspart (NOVOLOG) 100 UNIT/ML injection, Inject 0-15 Units into the skin 4 (four) times daily - after meals and at bedtime. (Patient taking differently: Inject 6 Units into the skin 3 (three) times daily. Per sliding scale), Disp: 10 mL, Rfl: 0   insulin glargine (LANTUS SOLOSTAR)  100 UNIT/ML Solostar Pen, Inject 5 Units into the skin at bedtime., Disp: , Rfl:    Melatonin 10 MG TABS, Take 10 mg by mouth at bedtime., Disp: , Rfl:    predniSONE (DELTASONE) 5 MG tablet, Take 10 mg by mouth daily with breakfast., Disp: , Rfl:    rosuvastatin (CRESTOR) 20 MG tablet, TAKE 1 TABLET BY MOUTH EVERY DAY, Disp: 30 tablet, Rfl: 0   tacrolimus (PROGRAF) 1 MG capsule, Take 1 mg by mouth 2 (two) times daily., Disp: , Rfl:    thiamine 100 MG tablet, Take 100 mg by mouth daily., Disp: , Rfl:   Social History   Tobacco Use  Smoking Status Former   Packs/day: 0.12   Years: 10.00   Additional pack years: 0.00   Total pack years: 1.20   Types: Cigarettes   Quit date: 04/27/1986   Years since quitting: 36.2  Smokeless Tobacco Never  Tobacco Comments   07/28/2012 "quit smoking cigarettes 20-30 yr ago"    Allergies  Allergen Reactions   Other Rash     Oxyquinoline-white Pet-lanolin   Aquamed Rash   Bag Balm  [Albolene] Rash   Cold Cream Rash   Objective:  There were no vitals filed for this visit. There is no height or weight on file to calculate BMI. Constitutional Well developed. Well nourished.  Vascular Dorsalis pedis pulses nonpalpable bilaterally. Posterior tibial pulses nonpalpable bilaterally. Capillary refill diminished to all digits.  No cyanosis or clubbing noted. Pedal hair growth normal.  Neurologic Normal speech. Oriented to person, place, and time. Epicritic sensation to light touch grossly present bilaterally.  Dermatologic Nails well groomed and normal in appearance. No open wounds. No skin lesions.  Orthopedic: Right hallux distal tip ulceration with fat layer exposed does not probe down to bone.  No purulent drainage noted.  No erythema noted.   Radiographs: 3 views of skeletally mature the right foot: No gross osteomyelitic changes noted.  Slight cortical thinning may be appreciated on the x-ray. Assessment:   1. Vascular abnormality    Plan:  Patient was evaluated and treated and all questions answered.  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 -Minimal debridement was carried out as patient has underlying vascular flow issues due to darkening of the skin in the setting of 1 palpable pulses -We ordered ABIs PVRs.  He may have missed the phone call -Continue Betadine wet-to-dry dressing changes daily -Continue doxycycline for 30 days for skin and soft tissue prophylaxis -Patient has seen a vascular doctor in [place] by the name of Judith Blonder.

## 2022-08-05 ENCOUNTER — Encounter: Payer: Self-pay | Admitting: Internal Medicine

## 2022-08-05 ENCOUNTER — Ambulatory Visit (INDEPENDENT_AMBULATORY_CARE_PROVIDER_SITE_OTHER): Payer: Medicare Other | Admitting: Internal Medicine

## 2022-08-05 VITALS — BP 124/70 | HR 75 | Temp 97.6°F | Ht 69.0 in | Wt 157.7 lb

## 2022-08-05 DIAGNOSIS — E1140 Type 2 diabetes mellitus with diabetic neuropathy, unspecified: Secondary | ICD-10-CM | POA: Diagnosis not present

## 2022-08-05 DIAGNOSIS — I152 Hypertension secondary to endocrine disorders: Secondary | ICD-10-CM | POA: Diagnosis not present

## 2022-08-05 DIAGNOSIS — E1159 Type 2 diabetes mellitus with other circulatory complications: Secondary | ICD-10-CM

## 2022-08-05 DIAGNOSIS — Z125 Encounter for screening for malignant neoplasm of prostate: Secondary | ICD-10-CM

## 2022-08-05 DIAGNOSIS — Z940 Kidney transplant status: Secondary | ICD-10-CM

## 2022-08-05 DIAGNOSIS — Z0000 Encounter for general adult medical examination without abnormal findings: Secondary | ICD-10-CM

## 2022-08-05 DIAGNOSIS — E1169 Type 2 diabetes mellitus with other specified complication: Secondary | ICD-10-CM | POA: Diagnosis not present

## 2022-08-05 DIAGNOSIS — E785 Hyperlipidemia, unspecified: Secondary | ICD-10-CM | POA: Diagnosis not present

## 2022-08-05 LAB — COMPREHENSIVE METABOLIC PANEL
ALT: 11 U/L (ref 0–53)
AST: 12 U/L (ref 0–37)
Albumin: 4.2 g/dL (ref 3.5–5.2)
Alkaline Phosphatase: 83 U/L (ref 39–117)
BUN: 47 mg/dL — ABNORMAL HIGH (ref 6–23)
CO2: 25 mEq/L (ref 19–32)
Calcium: 9.7 mg/dL (ref 8.4–10.5)
Chloride: 107 mEq/L (ref 96–112)
Creatinine, Ser: 2.7 mg/dL — ABNORMAL HIGH (ref 0.40–1.50)
GFR: 22.66 mL/min — ABNORMAL LOW (ref >60.00)
Glucose, Bld: 163 mg/dL — ABNORMAL HIGH (ref 70–99)
Potassium: 4.9 mEq/L (ref 3.5–5.1)
Sodium: 140 mEq/L (ref 135–145)
Total Bilirubin: 0.3 mg/dL (ref 0.2–1.2)
Total Protein: 7.3 g/dL (ref 6.0–8.3)

## 2022-08-05 LAB — LIPID PANEL
Cholesterol: 156 mg/dL (ref 0–200)
HDL: 62.9 mg/dL (ref >39.00)
LDL Cholesterol: 62 mg/dL (ref 0–99)
NonHDL: 93.55
Total CHOL/HDL Ratio: 2
Triglycerides: 158 mg/dL — ABNORMAL HIGH (ref 0.0–149.0)
VLDL: 31.6 mg/dL (ref 0.0–40.0)

## 2022-08-05 LAB — CBC WITH DIFFERENTIAL/PLATELET
Basophils Absolute: 0 10*3/uL (ref 0.0–0.1)
Basophils Relative: 0.8 % (ref 0.0–3.0)
Eosinophils Absolute: 0.2 10*3/uL (ref 0.0–0.7)
Eosinophils Relative: 3.4 % (ref 0.0–5.0)
HCT: 35.3 % — ABNORMAL LOW (ref 39.0–52.0)
Hemoglobin: 11.4 g/dL — ABNORMAL LOW (ref 13.0–17.0)
Lymphocytes Relative: 10.7 % — ABNORMAL LOW (ref 12.0–46.0)
Lymphs Abs: 0.6 10*3/uL — ABNORMAL LOW (ref 0.7–4.0)
MCHC: 32.4 g/dL (ref 30.0–36.0)
MCV: 86.2 fl (ref 78.0–100.0)
Monocytes Absolute: 0.7 10*3/uL (ref 0.1–1.0)
Monocytes Relative: 11.7 % (ref 3.0–12.0)
Neutro Abs: 4.4 10*3/uL (ref 1.4–7.7)
Neutrophils Relative %: 73.4 % (ref 43.0–77.0)
Platelets: 244 10*3/uL (ref 150.0–400.0)
RBC: 4.09 Mil/uL — ABNORMAL LOW (ref 4.22–5.81)
RDW: 14.4 % (ref 11.5–15.5)
WBC: 6 10*3/uL (ref 4.0–10.5)

## 2022-08-05 LAB — VITAMIN B12: Vitamin B-12: 334 pg/mL (ref 211–911)

## 2022-08-05 LAB — VITAMIN D 25 HYDROXY (VIT D DEFICIENCY, FRACTURES): VITD: 10.85 ng/mL — ABNORMAL LOW (ref 30.00–100.00)

## 2022-08-05 LAB — HEMOGLOBIN A1C: Hgb A1c MFr Bld: 8.3 % — ABNORMAL HIGH (ref 4.6–6.5)

## 2022-08-05 LAB — PSA: PSA: 0 ng/mL — ABNORMAL LOW (ref 0.10–4.00)

## 2022-08-05 LAB — TSH: TSH: 2.75 u[IU]/mL (ref 0.35–5.50)

## 2022-08-05 NOTE — Progress Notes (Signed)
Established Patient Office Visit     CC/Reason for Visit: Annual preventive exam and subsequent Medicare wellness visit  HPI: [name] [name] is a 72 y.o. male who is coming in today for the above mentioned reasons. Past Medical History is significant for: Hypertension, hyperlipidemia, insulin-dependent diabetes.  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  Since I last saw him he has had staged cataract surgery will be going for second eye next month.  He has routine eye care, has never seen a dentist.  He is overdue for COVID, flu, RSV, shingles vaccines.  Had a colonoscopy in 2021.   Past Medical/Surgical History: Past Medical History:  Diagnosis Date   Anemia    as a younger man   Arthritis    hands   Cataract    Chronic kidney disease    "starting to bother me now" (07/28/2012)   Diabetes mellitus     type II   Diarrhea, functional    ED (erectile dysfunction)    Hypertension    Neuropathy    Peripheral neuropathy     Past Surgical History:  Procedure Laterality Date   APPENDECTOMY  1950's   AV FISTULA PLACEMENT Right 05/14/2016   Procedure: ARTERIOVENOUS (AV) FISTULA CREATION- RIGHT ARM;  Surgeon: Serafina Mitchell, MD;  Location: [facility];  Service: Vascular;  Laterality: Right;   COLONOSCOPY     KIDNEY TRANSPLANT Right    PROSTATE SURGERY     UPPER GASTROINTESTINAL ENDOSCOPY      Social History:  reports that he quit smoking about 36 years ago. His smoking use included cigarettes. He has a 1.20 pack-year smoking history. He has never used smokeless tobacco. He reports that he does not currently use alcohol after a past usage of about 1.0 standard drink of alcohol per week. He reports that he does not use drugs.  Allergies: Allergies  Allergen Reactions   Other Rash    Oxyquinoline-white Pet-lanolin   Aquamed Rash   Bag Balm  [Albolene] Rash   Cold Cream  Rash    Family History:  Family History  Problem Relation Age of Onset   Colon polyps Mother    Stroke Mother    Diabetes Father    Diabetes Cousin    Prostate cancer Brother    Other Daughter        kidney problems   Esophageal cancer Maternal Aunt    Colon cancer Neg Hx    Stomach cancer Neg Hx    Rectal cancer Neg Hx      Current Outpatient Medications:    Alcohol Swabs (PHARMACIST CHOICE ALCOHOL) PADS, SMARTSIG:1 Each Topical 4 Times Daily, Disp: , Rfl:    amLODipine (NORVASC) 2.5 MG tablet, Take 2.5 mg by mouth daily., Disp: , Rfl:    apixaban (ELIQUIS) 5 MG TABS tablet, Take 5 mg by mouth 2 (two) times daily., Disp: , Rfl:    aspirin 81 MG chewable tablet, Chew by mouth., Disp: , Rfl:    BAQSIMI ONE PACK 3 MG/DOSE POWD, Place 1 spray into the nose daily as needed for low blood sugar., Disp: , Rfl:    calcitRIOL (ROCALTROL) 0.5 MCG capsule, Take by mouth., Disp: , Rfl:    carvedilol (COREG) 6.25 MG tablet, Take 6.25 mg by mouth 2 (two) times daily., Disp: , Rfl:    COMFORT EZ PEN NEEDLES 32G X 4 MM MISC, , Disp: , Rfl:  doxycycline (VIBRA-TABS) 100 MG tablet, Take 1 tablet (100 mg total) by mouth 2 (two) times daily., Disp: 60 tablet, Rfl: 0   enoxaparin (LOVENOX) 60 MG/0.6ML injection, Inject into the skin., Disp: , Rfl:    famotidine (PEPCID) 20 MG tablet, Take 20 mg by mouth 2 (two) times daily., Disp: , Rfl:    folic acid (FOLVITE) 1 MG tablet, Take 1 mg by mouth daily., Disp: , Rfl:    furosemide (LASIX) 40 MG tablet, Take 40 mg by mouth daily., Disp: , Rfl:    Heparin Sod, Pork, Lock Flush (HEPARIN FLUSH) 10 UNIT/ML SOLN injection, , Disp: , Rfl:    HUMALOG KWIKPEN 100 UNIT/ML KwikPen, Inject into the skin., Disp: , Rfl:    insulin aspart (NOVOLOG) 100 UNIT/ML injection, Inject 0-15 Units into the skin 4 (four) times daily - after meals and at bedtime. (Patient taking differently: Inject 6 Units into the skin 3 (three) times daily. Per sliding scale), Disp: 10 mL,  Rfl: 0   insulin glargine (LANTUS SOLOSTAR) 100 UNIT/ML Solostar Pen, Inject 5 Units into the skin at bedtime., Disp: , Rfl:    ketorolac (ACULAR) 0.5 % ophthalmic solution, Apply to eye., Disp: , Rfl:    Melatonin 10 MG TABS, Take 10 mg by mouth at bedtime., Disp: , Rfl:    Methoxy PEG-Epoetin Beta (MIRCERA) 50 MCG/0.3ML SOSY, Inject into the skin., Disp: , Rfl:    ofloxacin (OCUFLOX) 0.3 % ophthalmic solution, Place into the left eye., Disp: , Rfl:    prednisoLONE acetate (PRED FORTE) 1 % ophthalmic suspension, Place into the left eye., Disp: , Rfl:    predniSONE (DELTASONE) 5 MG tablet, Take 10 mg by mouth daily with breakfast., Disp: , Rfl:    rosuvastatin (CRESTOR) 20 MG tablet, TAKE 1 TABLET BY MOUTH EVERY DAY, Disp: 30 tablet, Rfl: 0   sevelamer carbonate (RENVELA) 800 MG tablet, , Disp: , Rfl:    tacrolimus (PROGRAF) 1 MG capsule, Take 1 mg by mouth 2 (two) times daily., Disp: , Rfl:    telmisartan (MICARDIS) 20 MG tablet, Take by mouth., Disp: , Rfl:    thiamine 100 MG tablet, Take 100 mg by mouth daily., Disp: , Rfl:   Review of Systems:  Negative unless indicated in HPI.   Physical Exam: Vitals:   08/05/22 0815  BP: 124/70  Pulse: 75  Temp: 97.6 F (36.4 C)  TempSrc: Oral  SpO2: 99%  Weight: 157 lb 11.2 oz (71.5 kg)  Height: 5\' 9"  (1.753 m)    Body mass index is 23.29 kg/m.   Physical Exam Vitals reviewed.  Constitutional:      General: He is not in acute distress.    Appearance: Normal appearance. He is not ill-appearing, toxic-appearing or diaphoretic.  HENT:     Head: Normocephalic.     Right Ear: Tympanic membrane, ear canal and external ear normal. There is no impacted cerumen.     Left Ear: Tympanic membrane, ear canal and external ear normal. There is no impacted cerumen.     Nose: Nose normal.     Mouth/Throat:     Mouth: Mucous membranes are moist.     Pharynx: Oropharynx is clear. No oropharyngeal exudate or posterior oropharyngeal erythema.  Eyes:      General: No scleral icterus.       Right eye: No discharge.        Left eye: No discharge.     Conjunctiva/sclera: Conjunctivae normal.     Pupils: Pupils are  equal, round, and reactive to light.  Neck:     Vascular: No carotid bruit.  Cardiovascular:     Rate and Rhythm: Normal rate and regular rhythm.     Pulses: Normal pulses.     Heart sounds: Normal heart sounds.  Pulmonary:     Effort: Pulmonary effort is normal. No respiratory distress.     Breath sounds: Normal breath sounds.  Abdominal:     General: Abdomen is flat. Bowel sounds are normal.     Palpations: Abdomen is soft.  Musculoskeletal:        General: Normal range of motion.     Cervical back: Normal range of motion.  Skin:    General: Skin is warm and dry.  Neurological:     General: No focal deficit present.     Mental Status: He is alert and oriented to person, place, and time. Mental status is at baseline.  Psychiatric:        Mood and Affect: Mood normal.        Behavior: Behavior normal.        Thought Content: Thought content normal.        Judgment: Judgment normal.      Subsequent Medicare wellness visit   1. Risk factors, based on past  M,S,F - Cardiac Risk Factors include: advanced age (>74men, >51 women);hypertension;diabetes mellitus;male gender   2.  Physical activities: Dietary issues and exercise activities discussed:      3.  Depression/mood:  [place] Office Visit from 01/16/2022 in [place] at [facility] Total Score 2        4.  ADL's:    08/05/2022    8:03 AM 09/09/2021   11:24 PM  In your present state of health, do you have any difficulty performing the following activities:  Hearing? 0 0  Vision? 1 1  Comment reading   Difficulty concentrating or making decisions? 1 0  Walking or climbing stairs? 1 1  Dressing or bathing? 0 0  Doing errands, shopping? 0 0  Preparing Food and eating ? N   Using the Toilet? N   In the past six months,  have you accidently leaked urine? Y   Do you have problems with loss of bowel control? Y   Managing your Medications? Y   Managing your Finances? Y   Housekeeping or managing your Housekeeping? N      5.  Fall risk:     01/07/2022   12:03 PM 01/13/2022    5:27 PM 01/13/2022   10:12 PM 01/16/2022    8:54 AM 08/05/2022    8:08 AM  Fall Risk  Falls in the past year?    1 0  Was there an injury with Fall?    1 0  Fall Risk Category Calculator    3 0  Fall Risk Category (Retired)    High   (RETIRED) Patient Fall Risk Level Moderate fall risk Moderate fall risk High fall risk High fall risk   Patient at Risk for Falls Due to    Impaired balance/gait   Fall risk Follow up    Falls evaluation completed Falls evaluation completed     6.  Home safety: No problems identified   7.  Height weight, and visual acuity: height and weight as above, vision/hearing: Vision Screening   Right eye Left eye Both eyes  Without correction 20/80 20/20 20/20   With correction        8.  Counseling: Counseling given: Not Answered Tobacco comments: 07/28/2012 "quit smoking cigarettes 20-30 yr ago"    29. Lab orders based on risk factors: Laboratory update will be reviewed   10. Cognitive assessment:        08/05/2022    8:09 AM  6CIT Screen  What Year? 0 points  What month? 0 points  What time? 0 points  Count back from 20 0 points  Months in reverse 0 points  Repeat phrase 0 points  Total Score 0 points     11. Screening: Patient provided with a written and personalized 5-10 year screening schedule in the AVS. Health Maintenance  Topic Date Due   Eye exam for diabetics  08/05/2022*   COVID-19 Vaccine (5 - 2023-24 season) 08/21/2022*   Hepatitis C Screening: USPSTF Recommendation to screen - Ages 18-79 yo.  11/05/2022*   Zoster (Shingles) Vaccine (1 of 2) 05/21/2023*   Hemoglobin A1C  09/15/2022   Complete foot exam   01/03/2023   Medicare Annual Wellness Visit  08/05/2023   Colon Cancer  Screening  08/08/2024   DTaP/Tdap/Td vaccine (3 - Td or Tdap) 05/28/2025   Pneumonia Vaccine  Completed   Flu Shot  Completed   HPV Vaccine  Aged Out  *Topic was postponed. The date shown is not the original due date.    12. Provider List Update: Patient Care Team    Relationship Specialty Notifications Start End  [name], [name], MD PCP - General Internal Medicine  04/19/19   Estanislado Emms, MD Consulting Physician Nephrology  06/29/16      13. Advance Directives: Does Patient Have a Medical Advance Directive?: No Would patient like information on creating a medical advance directive?: No - Patient declined  14. Opioids: Patient is not on any opioid prescriptions and has no risk factors for a substance use disorder.   15.   Goals      drive         I have personally reviewed and noted the following in the patient's chart:   Medical and social history Use of alcohol, tobacco or illicit drugs  Current medications and supplements Functional ability and status Nutritional status Physical activity Advanced directives List of other physicians Hospitalizations, surgeries, and ER visits in previous 12 months Vitals Screenings to include cognitive, depression, and falls Referrals and appointments  In addition, I have reviewed and discussed with patient certain preventive protocols, quality metrics, and best practice recommendations. A written personalized care plan for preventive services as well as general preventive health recommendations were provided to patient.  Impression and Plan:  Type 2 diabetes mellitus with diabetic neuropathy, without long-term current use of insulin ([place]) - Plan: CBC with Differential/Platelet, Comprehensive metabolic panel, Hemoglobin A1c  Encounter for preventive health examination - Plan: PSA  Hypertension associated with diabetes ([place])  Hyperlipidemia associated with type 2 diabetes mellitus ([place]) - Plan: Lipid panel  Kidney  transplanted - Plan: TSH, Vitamin B12, Vitamin D, 25-hydroxy, Hep C Antibody  -Recommend routine eye and dental care. -Healthy lifestyle discussed in detail. -Labs to be updated today. -Prostate cancer screening: PSA today Health Maintenance  Topic Date Due   Eye exam for diabetics  08/05/2022*   COVID-19 Vaccine (5 - 2023-24 season) 08/21/2022*   Hepatitis C Screening: USPSTF Recommendation to screen - Ages 18-79 yo.  11/05/2022*   Zoster (Shingles) Vaccine (1 of 2) 05/21/2023*   Hemoglobin A1C  09/15/2022   Complete foot exam   01/03/2023   Medicare Annual  Wellness Visit  08/05/2023   Colon Cancer Screening  08/08/2024   DTaP/Tdap/Td vaccine (3 - Td or Tdap) 05/28/2025   Pneumonia Vaccine  Completed   Flu Shot  Completed   HPV Vaccine  Aged Out  *Topic was postponed. The date shown is not the original due date.         Lelon Frohlich, MD [facility]

## 2022-08-05 NOTE — Patient Instructions (Signed)
-  Nice seeing you today!!  -Lab work today; will notify you once results are available.  -Remember to update flu, COVID, RSV and shingles vaccines.  -Schedule follow up in 6 months or sooner as needed.

## 2022-08-06 ENCOUNTER — Encounter: Payer: Self-pay | Admitting: Internal Medicine

## 2022-08-06 ENCOUNTER — Other Ambulatory Visit: Payer: Self-pay | Admitting: Internal Medicine

## 2022-08-06 DIAGNOSIS — E559 Vitamin D deficiency, unspecified: Secondary | ICD-10-CM | POA: Insufficient documentation

## 2022-08-06 LAB — HEPATITIS C ANTIBODY: Hepatitis C Ab: NONREACTIVE

## 2022-08-06 MED ORDER — VITAMIN D (ERGOCALCIFEROL) 1.25 MG (50000 UNIT) PO CAPS: 50000.0000 [IU] | ORAL_CAPSULE | ORAL | 0 refills | Status: AC

## 2022-08-07 DIAGNOSIS — N390 Urinary tract infection, site not specified: Secondary | ICD-10-CM | POA: Diagnosis not present

## 2022-08-07 DIAGNOSIS — Z940 Kidney transplant status: Secondary | ICD-10-CM | POA: Diagnosis not present

## 2022-08-07 DIAGNOSIS — Z48298 Encounter for aftercare following other organ transplant: Secondary | ICD-10-CM | POA: Diagnosis not present

## 2022-08-07 DIAGNOSIS — D849 Immunodeficiency, unspecified: Secondary | ICD-10-CM | POA: Diagnosis not present

## 2022-08-10 ENCOUNTER — Other Ambulatory Visit: Payer: Self-pay | Admitting: Internal Medicine

## 2022-08-10 DIAGNOSIS — Z940 Kidney transplant status: Secondary | ICD-10-CM

## 2022-08-10 DIAGNOSIS — E1165 Type 2 diabetes mellitus with hyperglycemia: Secondary | ICD-10-CM | POA: Diagnosis not present

## 2022-08-10 DIAGNOSIS — Z794 Long term (current) use of insulin: Secondary | ICD-10-CM | POA: Diagnosis not present

## 2022-08-10 DIAGNOSIS — E1140 Type 2 diabetes mellitus with diabetic neuropathy, unspecified: Secondary | ICD-10-CM

## 2022-08-10 DIAGNOSIS — E1169 Type 2 diabetes mellitus with other specified complication: Secondary | ICD-10-CM

## 2022-08-10 DIAGNOSIS — Z79899 Other long term (current) drug therapy: Secondary | ICD-10-CM | POA: Diagnosis not present

## 2022-08-10 DIAGNOSIS — I152 Hypertension secondary to endocrine disorders: Secondary | ICD-10-CM

## 2022-08-10 DIAGNOSIS — E559 Vitamin D deficiency, unspecified: Secondary | ICD-10-CM

## 2022-08-21 DIAGNOSIS — Z79899 Other long term (current) drug therapy: Secondary | ICD-10-CM | POA: Diagnosis not present

## 2022-08-21 DIAGNOSIS — I120 Hypertensive chronic kidney disease with stage 5 chronic kidney disease or end stage renal disease: Secondary | ICD-10-CM | POA: Diagnosis not present

## 2022-08-21 DIAGNOSIS — E1122 Type 2 diabetes mellitus with diabetic chronic kidney disease: Secondary | ICD-10-CM | POA: Diagnosis not present

## 2022-08-21 DIAGNOSIS — D631 Anemia in chronic kidney disease: Secondary | ICD-10-CM | POA: Diagnosis not present

## 2022-08-21 DIAGNOSIS — Z794 Long term (current) use of insulin: Secondary | ICD-10-CM | POA: Diagnosis not present

## 2022-08-21 DIAGNOSIS — Z7901 Long term (current) use of anticoagulants: Secondary | ICD-10-CM | POA: Diagnosis not present

## 2022-08-21 DIAGNOSIS — H25811 Combined forms of age-related cataract, right eye: Secondary | ICD-10-CM | POA: Diagnosis not present

## 2022-08-21 DIAGNOSIS — Z86718 Personal history of other venous thrombosis and embolism: Secondary | ICD-10-CM | POA: Diagnosis not present

## 2022-08-21 DIAGNOSIS — Z87891 Personal history of nicotine dependence: Secondary | ICD-10-CM | POA: Diagnosis not present

## 2022-08-21 DIAGNOSIS — Z79621 Long term (current) use of calcineurin inhibitor: Secondary | ICD-10-CM | POA: Diagnosis not present

## 2022-08-21 DIAGNOSIS — Z940 Kidney transplant status: Secondary | ICD-10-CM | POA: Diagnosis not present

## 2022-08-21 DIAGNOSIS — N186 End stage renal disease: Secondary | ICD-10-CM | POA: Diagnosis not present

## 2022-08-21 DIAGNOSIS — Z7952 Long term (current) use of systemic steroids: Secondary | ICD-10-CM | POA: Diagnosis not present

## 2022-08-21 DIAGNOSIS — E1136 Type 2 diabetes mellitus with diabetic cataract: Secondary | ICD-10-CM | POA: Diagnosis not present

## 2022-08-27 ENCOUNTER — Other Ambulatory Visit: Payer: Self-pay | Admitting: Internal Medicine

## 2022-09-01 ENCOUNTER — Ambulatory Visit (INDEPENDENT_AMBULATORY_CARE_PROVIDER_SITE_OTHER): Payer: Medicare Other | Admitting: Podiatry

## 2022-09-01 DIAGNOSIS — L97512 Non-pressure chronic ulcer of other part of right foot with fat layer exposed: Secondary | ICD-10-CM | POA: Diagnosis not present

## 2022-09-01 DIAGNOSIS — E1142 Type 2 diabetes mellitus with diabetic polyneuropathy: Secondary | ICD-10-CM | POA: Diagnosis not present

## 2022-09-01 NOTE — Progress Notes (Unsigned)
Subjective:  Patient ID: [name] [name], male    DOB: September 14, 1950,  MRN: [id number]  Chief Complaint  Patient presents with   Toe Pain    Right hallux     72 y.o. male presents with the above complaint.  Patient presents with right great toe ulceration.  Patient states that he is doing okay.  He states he looks a lot better.  Denies any other acute complaints has been doing Betadine wet-to-dry dressing   Review of Systems: Negative except as noted in the HPI. Denies N/V/F/Ch.  Past Medical History:  Diagnosis Date   Anemia    as a younger man   Arthritis    hands   Cataract    Chronic kidney disease    "starting to bother me now" (07/28/2012)   Diabetes mellitus     type II   Diarrhea, functional    ED (erectile dysfunction)    Hypertension    Neuropathy    Peripheral neuropathy     Current Outpatient Medications:    Alcohol Swabs (PHARMACIST CHOICE ALCOHOL) PADS, SMARTSIG:1 Each Topical 4 Times Daily, Disp: , Rfl:    amLODipine (NORVASC) 2.5 MG tablet, Take 2.5 mg by mouth daily., Disp: , Rfl:    apixaban (ELIQUIS) 5 MG TABS tablet, Take 5 mg by mouth 2 (two) times daily., Disp: , Rfl:    aspirin 81 MG chewable tablet, Chew by mouth., Disp: , Rfl:    BAQSIMI ONE PACK 3 MG/DOSE POWD, Place 1 spray into the nose daily as needed for low blood sugar., Disp: , Rfl:    calcitRIOL (ROCALTROL) 0.5 MCG capsule, Take by mouth., Disp: , Rfl:    carvedilol (COREG) 6.25 MG tablet, Take 6.25 mg by mouth 2 (two) times daily., Disp: , Rfl:    COMFORT EZ PEN NEEDLES 32G X 4 MM MISC, , Disp: , Rfl:    enoxaparin (LOVENOX) 60 MG/0.6ML injection, Inject into the skin., Disp: , Rfl:    famotidine (PEPCID) 20 MG tablet, Take 20 mg by mouth 2 (two) times daily., Disp: , Rfl:    folic acid (FOLVITE) 1 MG tablet, Take 1 mg by mouth daily., Disp: , Rfl:    furosemide (LASIX) 40 MG tablet, Take 40 mg by mouth daily., Disp: , Rfl:    Heparin Sod, Pork, Lock Flush (HEPARIN FLUSH) 10 UNIT/ML SOLN  injection, , Disp: , Rfl:    HUMALOG KWIKPEN 100 UNIT/ML KwikPen, Inject into the skin., Disp: , Rfl:    insulin aspart (NOVOLOG) 100 UNIT/ML injection, Inject 0-15 Units into the skin 4 (four) times daily - after meals and at bedtime. (Patient taking differently: Inject 6 Units into the skin 3 (three) times daily. Per sliding scale), Disp: 10 mL, Rfl: 0   insulin glargine (LANTUS SOLOSTAR) 100 UNIT/ML Solostar Pen, Inject 5 Units into the skin at bedtime., Disp: , Rfl:    ketorolac (ACULAR) 0.5 % ophthalmic solution, Apply to eye., Disp: , Rfl:    Melatonin 10 MG TABS, Take 10 mg by mouth at bedtime., Disp: , Rfl:    Methoxy PEG-Epoetin Beta (MIRCERA) 50 MCG/0.3ML SOSY, Inject into the skin., Disp: , Rfl:    ofloxacin (OCUFLOX) 0.3 % ophthalmic solution, Place into the left eye., Disp: , Rfl:    prednisoLONE acetate (PRED FORTE) 1 % ophthalmic suspension, Place into the left eye., Disp: , Rfl:    predniSONE (DELTASONE) 5 MG tablet, Take 10 mg by mouth daily with breakfast., Disp: , Rfl:    rosuvastatin (  CRESTOR) 20 MG tablet, TAKE 1 TABLET BY MOUTH EVERY DAY, Disp: 90 tablet, Rfl: 1   sevelamer carbonate (RENVELA) 800 MG tablet, , Disp: , Rfl:    tacrolimus (PROGRAF) 1 MG capsule, Take 1 mg by mouth 2 (two) times daily., Disp: , Rfl:    telmisartan (MICARDIS) 20 MG tablet, Take by mouth., Disp: , Rfl:    thiamine 100 MG tablet, Take 100 mg by mouth daily., Disp: , Rfl:    Vitamin D, Ergocalciferol, (DRISDOL) 1.25 MG (50000 UNIT) CAPS capsule, Take 1 capsule (50,000 Units total) by mouth every 7 (seven) days for 12 doses., Disp: 12 capsule, Rfl: 0  Social History   Tobacco Use  Smoking Status Former   Packs/day: 0.12   Years: 10.00   Additional pack years: 0.00   Total pack years: 1.20   Types: Cigarettes   Quit date: 04/27/1986   Years since quitting: 36.3  Smokeless Tobacco Never  Tobacco Comments   07/28/2012 "quit smoking cigarettes 20-30 yr ago"    Allergies  Allergen Reactions    Other Rash    Oxyquinoline-white Pet-lanolin   Aquamed Rash   Bag Balm  [Albolene] Rash   Cold Cream Rash   Objective:  There were no vitals filed for this visit. There is no height or weight on file to calculate BMI. Constitutional Well developed. Well nourished.  Vascular Dorsalis pedis pulses nonpalpable bilaterally. Posterior tibial pulses nonpalpable bilaterally. Capillary refill diminished to all digits.  No cyanosis or clubbing noted. Pedal hair growth normal.  Neurologic Normal speech. Oriented to person, place, and time. Epicritic sensation to light touch grossly present bilaterally.  Dermatologic Nails well groomed and normal in appearance. No open wounds. No skin lesions.  Orthopedic: No further right hallux distal tip ulceration noted.  Skin has completely reepithelialized.  No purulent drainage noted.  No erythema noted.   Radiographs: 3 views of skeletally mature the right foot: No gross osteomyelitic changes noted.  Slight cortical thinning may be appreciated on the x-ray. Assessment:   No diagnosis found.  Plan:  Patient was evaluated and treated and all questions answered.  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 -Clinically completely reepithelialized.  No concern of ulceration noted at this time.  He was unable to get ABIs PVRs as patient does not get phone calls. -I encouraged him to follow-up with his vascular doctors

## 2022-09-17 ENCOUNTER — Telehealth: Payer: Self-pay

## 2022-09-17 NOTE — Patient Outreach (Signed)
  Care Coordination   09/17/2022 Name: [name] [name] MRN: [id number] DOB: June 22, 1950   Care Coordination Outreach Attempts:  An unsuccessful telephone outreach was attempted today to offer the patient information about available care coordination services.  Follow Up Plan:  Additional outreach attempts will be made to offer the patient care coordination information and services.   Encounter Outcome:  No Answer   Care Coordination Interventions:  No, not indicated    Bevelyn Ngo, BSW, CDP Social Worker, Certified Dementia Practitioner [facility] Care Management  Care Coordination [phone]

## 2022-09-21 ENCOUNTER — Telehealth: Payer: Self-pay

## 2022-09-21 NOTE — Progress Notes (Signed)
Patient ID: MARICO GENERAL, male   DOB: 1950-10-09, 72 y.o.   MRN: [id number]  Care Management & Coordination Services Pharmacy Team  Reason for Encounter:Chart prep for initial encounter with Milas Kocher  Clinical Pharmacist on 09/25/22 at 8:30 in office.    Spoke with Tyleen Daughter on 09/21/2022   Have you seen any other providers since your last visit? Daughter reports none other than Eye and Transplant providers  Any changes in your medications or health? Daughter reports no  Any side effects from any medications?Daughter reports he has not complained nor has she noticed any  Do you have an symptoms or problems not managed by your medications? Daughter reports none  Any concerns about your health right now? Daughter reports he seems to be doing pretty well right now  Has your provider asked that you check blood pressure, blood sugar, or follow special diet at home? Daughter reports he does not have a blood pressure cuff and is not checking at home, he is using the [place] but has been without it for several weeks.  Do you have any problems getting your medications? Daughter reports she fills/picks up his medications mainly, she states they are affording and filling them fine no issues with their current pharmacy.  Daughter aware to bring, medications that do not need refrigeration and supplements to appointment   Chart review:  Recent office visits:  08/05/22 Philip Aspen, Limmie Patricia, MD - Patient presented for Type 2 diabetes mellitus with diabetic neuropathy without long term current use of insulin and other concerns. No medication changes.  Recent consult visits:  09/01/22 Candelaria Stagers, DPM (Podiatry) - Patient presented for Diabetic polyneuropathy associated with type 2 diabetes mellitus and other concerns. No medication changes.   08/27/22 Barnet Glasgow (Ophthalmology) - Patient presented for Post operative state. No other visit details available.   08/21/22  Barnet Glasgow (Ophthalmology) - Patient presented for Extracapsular cataract phaco removal with insertion of intraocular lens prosthesis and other concerns. No other visit details available.   08/10/22 Vania Rea (Endo)  - Patient presented for Type 2 diabetes mellitus with hyperglycemia with long term current use of insulin. No medication changes.   08/04/22 Candelaria Stagers, DPM - Patient presented for Vascular abnormality and other concerns. No medication changes.   07/17/22 Paulla Dolly, MD  (Gen Surgery) - Patient presented for Nuclear sclerotic cataract of both eyes and other concerns. Prescribed Drops for both eyes.   07/14/22 Candelaria Stagers, DPM - Patient presented for Vascular abnormality and other concerns. Prescribed Doxycycline Hyclate.   07/07/22 Sanoff, Ronald Pippins and Rennis Harding [facility]) - Patient presented for kidney replaced by transplant and other concerns. No other visit details available.   07/07/22 Paulla Dolly, MD - Patient presented for Cataract of both eyes unspecified cataract type and other concerns. No other visit details available.   06/09/22 Barthel Osa Craver - Patient presented for Malignant neoplasm of prostate and other concerns. No other visit details available.   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 No other visit details available.   04/17/22 Arsenio Loader, MD  ([facility]) - Patient presented for Kidney txp clinic follow up. No medication changes.   04/17/22 Hodulik, Birdena Jubilee and Ortel, Huston Foley - (Hematology) - Patient presented for History of pulmonary embolus and other concerns. No other visit details available.  04/08/22 Helane Gunther, DPM (Podiatry) - Patient presented for Pain due to onychomycosis of toenails of both feet and other concerns. No medication  changes.   03/30/22 Tonny Bollman - Claims encounter for syncope and collapse and other concerns. No other visit details available.    Hospital visits:  None in previous 6 months  Fill History : PHARMACIST CHOICE ALCOHOL PRED PADS   PADS 05/19/2022 75   AMLODIPINE BESYLATE 2.5 MG TAB 08/10/2022 90   ELIQUIS  5 MG TABS 06/15/2022 90   Calcitriol Oral Capsule 0.5 MCG 11/15/2019 90   CARVEDILOL 6.25 MG TABLET 07/29/2022 90   FREESTYLE LIBRE 2 READER 10/30/2020 30   FREESTYLE LIBRE 2 SENSOR 08/03/2021 28   DEXCOM G6 TRANSMITTER 06/07/2020 1   ENOXAPARIN 60 MG/0.6 ML SYR 08/05/2022 30   VITAMIN D2 1.25MG (50,000 UNIT) 08/06/2022 84   FAMOTIDINE 20 MG TABLET 07/29/2022 90   FOLIC ACID 1 MG TABLET 08/31/2022 90   FUROSEMIDE 40 MG TABLET 07/03/2022 90   LANTUS SOLOSTAR 100 UNIT/ML 08/03/2022 90   HUMALOG 100 UNIT/ML KWIKPEN 08/03/2022 90   CLEVER CHOICE COMFORT EZ PEN NEEDLE S 32GX4MM 32G X 4 MM MISC 05/19/2022 75   KETOROLAC 0.5% OPHTH SOLUTION 08/13/2022 21   MELATONIN 3 MG FAST DISSOLVE 12/26/2020 30   OFLOXACIN 0.3% EYE DROPS 08/13/2022 18   PREDNISOLONE AC 1% EYE DROP 08/13/2022 14   PREDNISONE 5 MG TABLET 08/31/2022 30   ROSUVASTATIN CALCIUM 20 MG TAB 08/27/2022 90   TACROLIMUS 1MG  CAP 08/27/2022 90     Star Rating Drugs:  Rosuvastatin 20 mg  - Last filled 08/27/22 90 DS at [facility] Telmisartan 20 mg - NO fill hx avail   Care Gaps: Eye Exam - Overdue COVID Booster - Overdue Zoster Vaccine - Postponed AWV - 08/05/22    Pamala Duffel CMA Clinical Pharmacist Assistant [phone]

## 2022-09-24 ENCOUNTER — Telehealth: Payer: Self-pay

## 2022-09-24 NOTE — Progress Notes (Unsigned)
Care Management & Coordination Services Pharmacy Note  09/24/2022 Name:  [name] [name] MRN:  [id number] DOB:  Jun 04, 1950  Summary: ***  Recommendations/Changes made from today's visit: ***  Follow up plan: ***   Subjective: [name] [name] is an 72 y.o. year old male who is a primary patient of Philip Aspen, Limmie Patricia, MD.  The care coordination team was consulted for assistance with disease management and care coordination needs.    {CCMTELEPHONEFACETOFACE:21091510} for initial visit.  Recent office visits: 08/05/22 Philip Aspen, Limmie Patricia, MD - Patient presented for Type 2 diabetes mellitus with diabetic neuropathy without long term current use of insulin and other concerns. No medication changes.   Recent consult visits: 09/01/22 Candelaria Stagers, DPM (Podiatry) - Patient presented for Diabetic polyneuropathy associated with type 2 diabetes mellitus and other concerns. No medication changes.    08/27/22 Barnet Glasgow (Ophthalmology) - Patient presented for Post operative state. No other visit details available.    08/21/22  Barnet Glasgow (Ophthalmology) - Patient presented for Extracapsular cataract phaco removal with insertion of intraocular lens prosthesis and other concerns. No other visit details available.    08/10/22 Vania Rea (Endo)  - Patient presented for Type 2 diabetes mellitus with hyperglycemia with long term current use of insulin. No medication changes.    08/04/22 Candelaria Stagers, DPM - Patient presented for Vascular abnormality and other concerns. No medication changes.    07/17/22 Paulla Dolly, MD  (Gen Surgery) - Patient presented for Nuclear sclerotic cataract of both eyes and other concerns. Prescribed Drops for both eyes.    07/14/22 Candelaria Stagers, DPM - Patient presented for Vascular abnormality and other concerns. Prescribed Doxycycline Hyclate.    07/07/22 Sanoff, Ronald Pippins and Rennis Harding [facility]) - Patient  presented for kidney replaced by transplant and other concerns. No other visit details available.    07/07/22 Paulla Dolly, MD - Patient presented for Cataract of both eyes unspecified cataract type and other concerns. No other visit details available.    06/09/22 Barthel Osa Craver - Patient presented for Malignant neoplasm of prostate and other concerns. No other visit details available.    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 No other visit details available.    04/17/22 Arsenio Loader, MD  ([facility]) - Patient presented for Kidney txp clinic follow up. No medication changes.    04/17/22 Hodulik, Birdena Jubilee and Ortel, Huston Foley - (Hematology) - Patient presented for History of pulmonary embolus and other concerns. No other visit details available.    04/08/22 Helane Gunther, DPM (Podiatry) - Patient presented for Pain due to onychomycosis of toenails of both feet and other concerns. No medication changes.    03/30/22 Tonny Bollman - Claims encounter for syncope and collapse and other concerns. No other visit details available.   Hospital visits: None in previous 6 months   Objective:  Lab Results  Component Value Date   CREATININE 2.70 (H) 08/05/2022   BUN 47 (H) 08/05/2022   GFR 22.66 (L) 08/05/2022   GFRNONAA 19 (L) 01/13/2022   GFRAA 41 (L) 01/16/2020   NA 140 08/05/2022   K 4.9 08/05/2022   CALCIUM 9.7 08/05/2022   CO2 25 08/05/2022   GLUCOSE 163 (H) 08/05/2022    Lab Results  Component Value Date/Time   HGBA1C 8.3 (H) 08/05/2022 08:53 AM   HGBA1C  9.0 03/16/2022 12:00 AM   HGBA1C 8.7 11/27/2021 12:00 AM   GFR 22.66 (L) 08/05/2022 08:53 AM   GFR 18.83 (L) 01/16/2022 08:33 AM   MICROALBUR 13.5 (H) 12/03/2021 12:52 PM   MICROALBUR 243.9 (H) 09/24/2015 11:27 AM    Last diabetic Eye exam:   Lab Results  Component Value Date/Time   HMDIABEYEEXA Retinopathy (A) 06/19/2015 12:00 AM    Last diabetic Foot exam:  Lab Results  Component Value Date/Time   HMDIABFOOTEX yes 06/14/2009 12:00 AM     Lab Results  Component Value Date   CHOL 156 08/05/2022   HDL 62.90 08/05/2022   LDLCALC 62 08/05/2022   LDLDIRECT 68.0 06/03/2021   TRIG 158.0 (H) 08/05/2022   CHOLHDL 2 08/05/2022       Latest Ref Rng & Units 08/05/2022    8:53 AM 01/16/2022    8:33 AM 01/13/2022    6:23 PM  Hepatic Function  Total Protein 6.0 - 8.3 g/dL 7.3  7.5  6.8   Albumin 3.5 - 5.2 g/dL 4.2  4.1  3.7   AST 0 - 37 U/L 12  13  17    ALT 0 - 53 U/L 11  12  11    Alk Phosphatase 39 - 117 U/L 83  69  54   Total Bilirubin 0.2 - 1.2 mg/dL 0.3  0.3  0.5     Lab Results  Component Value Date/Time   TSH 2.75 08/05/2022 08:53 AM   TSH 2.061 09/25/2020 10:30 PM   TSH 0.385 07/06/2020 04:59 AM   TSH 2.22 05/22/2015 08:20 AM   FREET4 0.75 09/26/2020 01:01 AM       Latest Ref Rng & Units 08/05/2022    8:53 AM 01/13/2022    6:23 PM 01/07/2022   12:03 PM  CBC  WBC 4.0 - 10.5 K/uL 6.0  6.8  5.1   Hemoglobin 13.0 - 17.0 g/dL 16.1  09.6  04.5   Hematocrit 39.0 - 52.0 % 35.3  35.9  35.7   Platelets 150.0 - 400.0 K/uL 244.0  202  204     Lab Results  Component Value Date/Time   VD25OH 10.85 (L) 08/05/2022 08:53 AM   VD25OH 6.78 (L) 06/11/2019 02:39 AM   VITAMINB12 334 08/05/2022 08:53 AM   VITAMINB12 1,206 (H) 09/26/2020 01:01 AM    Clinical ASCVD: Yes  The ASCVD Risk score (Arnett DK, et al., 2019) failed to calculate for the following reasons:   The patient has a prior MI or stroke diagnosis       08/05/2022    9:16 AM 01/16/2022    8:54 AM 12/03/2021   10:46 AM  Depression screen PHQ 2/9  Decreased Interest 0 0 0  Down, Depressed, Hopeless 0 0 0  PHQ - 2 Score 0 0 0  Altered sleeping 0 1 1  Tired, decreased energy 0 0 1  Change in appetite 0 1 0  Feeling bad or failure about yourself  0 0 0   Trouble concentrating 0 0 0  Moving slowly or fidgety/restless 0 0 0  Suicidal thoughts 0 0 0  PHQ-9 Score 0 2 2  Difficult doing work/chores  Not difficult at all Not difficult at all     Social History   Tobacco Use  Smoking Status Former   Packs/day: 0.12   Years: 10.00   Additional pack years: 0.00   Total pack years: 1.20   Types: Cigarettes   Quit date: 04/27/1986   Years since quitting: 30.4  Smokeless Tobacco Never  Tobacco Comments   07/28/2012 "quit smoking cigarettes 20-30 yr ago"   BP Readings from Last 3 Encounters:  08/05/22 124/70  03/17/22 124/60  01/16/22 126/73   Pulse Readings from Last 3 Encounters:  08/05/22 75  03/17/22 76  01/16/22 76   Wt Readings from Last 3 Encounters:  08/05/22 157 lb 11.2 oz (71.5 kg)  03/30/22 138 lb (62.6 kg)  03/17/22 138 lb (62.6 kg)   BMI Readings from Last 3 Encounters:  08/05/22 23.29 kg/m  03/30/22 20.38 kg/m  03/17/22 20.09 kg/m    Allergies  Allergen Reactions   Other Rash    Oxyquinoline-white Pet-lanolin   Aquamed Rash   Bag Balm  [Albolene] Rash   Cold Cream Rash    Medications Reviewed Today     Reviewed by Philip Aspen, Limmie Patricia, MD (Physician) on 08/05/22 at 0840  Med List Status: <None>   Medication Order Taking? Sig Documenting Provider Last Dose Status Informant  Alcohol Swabs (PHARMACIST CHOICE ALCOHOL) PADS [id number] Yes SMARTSIG:1 Each Topical 4 Times Daily [provider] Taking Active   amLODipine (NORVASC) 2.5 MG tablet [id number] Yes Take 2.5 mg by mouth daily. [provider] Taking Active Child  apixaban (ELIQUIS) 5 MG TABS tablet [id number] Yes Take 5 mg by mouth 2 (two) times daily. [provider] Taking Active Child  aspirin 81 MG chewable tablet [id number] Yes Chew by mouth. [provider] Taking Active   BAQSIMI ONE PACK 3 MG/DOSE POWD [id number] Yes Place 1 spray into the nose daily as needed for low blood sugar. [provider]  Taking Active Child  calcitRIOL (ROCALTROL) 0.5 MCG capsule [id number] Yes Take by mouth. [provider] Taking Active   carvedilol (COREG) 6.25 MG tablet [id number] Yes Take 6.25 mg by mouth 2 (two) times daily. [provider] Taking Active Child  COMFORT EZ PEN NEEDLES 32G X 4 MM MISC [id number] Yes  [provider] Taking Active   doxycycline (VIBRA-TABS) 100 MG tablet [id number] Yes Take 1 tablet (100 mg total) by mouth 2 (two) times daily. Candelaria Stagers, DPM Taking Active   enoxaparin (LOVENOX) 60 MG/0.6ML injection [id number] Yes Inject into the skin. [provider] Taking Active   famotidine (PEPCID) 20 MG tablet [id number] Yes Take 20 mg by mouth 2 (two) times daily. [provider] Taking Active Child  folic acid (FOLVITE) 1 MG tablet [id number] Yes Take 1 mg by mouth daily. [provider] Taking Active Child  furosemide (LASIX) 40 MG tablet [id number] Yes Take 40 mg by mouth daily. [provider] Taking Active Child  Heparin Sod, Pork, Lock Flush (HEPARIN FLUSH) 10 UNIT/ML SOLN injection [id number] Yes  [provider] Taking Active   HUMALOG KWIKPEN 100 UNIT/ML KwikPen [id number] Yes Inject into the skin. [provider] Taking Active   insulin aspart (NOVOLOG) 100 UNIT/ML injection [id number] Yes Inject 0-15 Units into the skin 4 (four) times daily - after meals and at bedtime.  Patient taking differently: Inject 6 Units into the skin 3 (three) times daily. Per sliding scale   Drema Dallas, MD Taking Active Child  insulin glargine (LANTUS SOLOSTAR) 100 UNIT/ML Solostar Pen [id number] Yes Inject 5 Units into the skin at bedtime. [provider] Taking Active Child           Med Note Maple Hudson, TIERICA T   Tue Mar 17, 2022 10:19 AM)    ketorolac (ACULAR) 0.5 % ophthalmic solution [id number] Yes Apply to eye. [provider] Taking Active   Melatonin 10 MG TABS [id number] Yes Take 10 mg by mouth at  bedtime. [provider] Taking Active Child  Methoxy PEG-Epoetin Beta (MIRCERA) 50 MCG/0.3ML SOSY [id number] Yes Inject into the skin. [provider] Taking Active   ofloxacin (OCUFLOX) 0.3 % ophthalmic solution [id number] Yes Place into the left eye. [provider] Taking Active   prednisoLONE acetate (PRED FORTE) 1 % ophthalmic suspension [id number] Yes Place into the left eye. [provider] Taking Active   predniSONE (DELTASONE) 5 MG tablet [id number] Yes Take 10 mg by mouth daily with breakfast. [provider] Taking Active Child  rosuvastatin (CRESTOR) 20 MG tablet [id number] Yes TAKE 1 TABLET BY MOUTH EVERY DAY Philip Aspen, Limmie Patricia, MD Taking Active   sevelamer carbonate (RENVELA) 800 MG tablet [id number] Yes  [provider] Taking Active   tacrolimus (PROGRAF) 1 MG capsule [id number] Yes Take 1 mg by mouth 2 (two) times daily. [provider] Taking Active Child  telmisartan (MICARDIS) 20 MG tablet [id number] Yes Take by mouth. [provider] Taking Active   thiamine 100 MG tablet [id number] Yes Take 100 mg by mouth daily. [provider] Taking Active Child            SDOH:  (Social Determinants of Health) assessments and interventions performed: Yes SDOH Interventions    Flowsheet Row Office Visit from 08/05/2022 in [facility] [place] Office Visit from 01/16/2022 in [facility] [place] Office Visit from 12/03/2021 in [facility] [place]  SDOH Interventions     Food Insecurity Interventions Intervention Not Indicated -- --  Housing Interventions Intervention Not Indicated -- --  Depression Interventions/Treatment  -- PHQ2-9 Score <4 Follow-up Not Indicated PHQ2-9 Score <4 Follow-up Not Indicated       Medication Assistance: {MEDASSISTANCEINFO:25044}  Medication Access: Within the past 30 days, how often has patient missed  a dose of medication? *** Is a pillbox or other method used to improve adherence? {YES/NO:21197} Factors that may affect medication adherence? {CHL DESC; BARRIERS:21522} Are meds synced by current pharmacy? {YES/NO:21197} Are meds delivered by current pharmacy? {YES/NO:21197} Does patient experience delays in picking up medications due to transportation concerns? {YES/NO:21197}  [facility] Services Reviewed: Is patient disadvantaged to use [facility]?: Yes  Current Rx insurance plan: UHC/BCBS Name and location of Current pharmacy:  [facility] - [place], [place] - [address] [place] [address] Phone: [phone] Fax: [phone]  [id number] - [place], [place] - [address] AT [facility] [address] [place] [address] Phone: [phone] Fax: [phone]  [facility] [address] [place] [address] Phone: [phone] Fax: [phone]  [facility] services reviewed with patient today?: No  Patient requests to transfer care to [facility]?: No  Reason patient declined to change pharmacies: Disadvantaged due to insurance/mail order  Compliance/Adherence/Medication fill history: Care Gaps: Eye Exam - Overdue COVID Booster - Overdue Zoster Vaccine - Postponed AWV - 08/05/22    Star-Rating Drugs: Rosuvastatin 20 mg  - [facility] Telmisartan 20 [facility] - NO fill hx avail   Assessment/Plan Hypertension (BP goal <130/80) -{US controlled/uncontrolled:25276} -Current treatment: Amlodipine 2.5mg  1 tab qd Carvedilol 6.25mg  BID Lasix 40mg  1 qd Telmisartan 20mg  1 qd -Medications previously tried: Atenolol, HCTZ, Losartan, Metoprolol, Torsemide -Current home readings: *** -Current dietary habits: *** -Current exercise habits: *** -{  ACTIONS;DENIES/REPORTS:21021675::"Denies"} hypotensive/hypertensive symptoms -Educated on  {CCM BP Counseling:25124} -Counseled to monitor BP at home ***, document, and provide log at future appointments -{CCMPHARMDINTERVENTION:25122}  Hyperlipidemia: (LDL goal < 70) -Controlled -Current treatment: Rosuvastatin 20mg  1 qd -Medications previously tried: Pravastatin  -Current dietary patterns: *** -Current exercise habits: *** -Educated on {CCM HLD Counseling:25126} -{CCMPHARMDINTERVENTION:25122}  Diabetes (A1c goal <7%) -Uncontrolled -Current medications: Lantus 11 units into the skin at bedtime Insulin aspart 3 units TID AC -Medications previously tried: Glipizide, Metformin, Januvia -Current home glucose readings fasting glucose: *** post prandial glucose: *** -{ACTIONS;DENIES/REPORTS:21021675::"Denies"} hypoglycemic/hyperglycemic symptoms -Current meal patterns:  breakfast: ***  lunch: ***  dinner: *** snacks: *** drinks: *** -Current exercise: *** -Educated on {CCM DM COUNSELING:25123} -Counseled to check feet daily and get yearly eye exams -{CCMPHARMDINTERVENTION:25122}  CAD/Hx of DVT/PE (Goal: Slow progression of atherosclerosis (plaques / blockages) throughout your body to reduce risk of heart attack and strokes) {US controlled/uncontrolled:25276:::1} Current Medication Therapy: Aspirin 81mg  1 qd Eliquis 5mg  BID Medications Previously Tried: None Chest Pain in last 3 months: {YES/NO} Any signs of bleed? Counseled on:  Secondary Parathyroidism (Goal: Ca, Phos WNL) -Not assessed today -Current treatment  Renvela 800mg  Calcitriol 0.27mcg  Kidney Transplant (Goal: Prevent rejection of donated kidney) -Not assessed today -Current treatment  Tacrolimus 1mg  1 BID  Vit D Def (Goal: Vit D WNL) -Not assessed today -Current treatment  Vit D 50,000 units once weekly  Malnutrition  -Not assessed today -Current treatment  Thiamine 100mg  1 qd Folic Acid 1mg  1 qd  Coagulation Disorder  -Not assessed today -Current treatment   Mircera        Sherrill Raring Clinical Pharmacist [phone]

## 2022-09-24 NOTE — Progress Notes (Signed)
Patient ID: [name] [name], male   DOB: [birth year], 72 y.o.   MRN: [id number]  Care Management & Coordination Services Pharmacy Team  Reason for Encounter: Appointment Reminder  Contacted patient to confirm in office appointment with Milas Kocher, PharmD on 09/25/22 at 8:30. Unsuccessful outreach. Left voicemail for patient to return call.      Star Rating Drugs:  Rosuvastatin 20 mg  - [facility] Telmisartan 20 mg - NO fill hx avail     Care Gaps: Eye Exam - Overdue COVID Booster - Overdue Zoster Vaccine - Postponed AWV - 08/05/22         Pamala Duffel CMA Clinical Pharmacist Assistant [phone]

## 2022-09-25 DIAGNOSIS — E1165 Type 2 diabetes mellitus with hyperglycemia: Secondary | ICD-10-CM | POA: Diagnosis not present

## 2022-09-29 NOTE — Progress Notes (Signed)
Care Management & Coordination Services Pharmacy Note  10/02/2022 Name:  [name] [name] MRN:  [id number] DOB:  [birth year]  Summary: BP at goal <130/80 A1C not at goal <7 but reports occasional lows in 50s (no log today, can't recall specifics)  Recommendations/Changes made from today's visit: -Counseled to check BP once weekly and keep a log -Counseled to setup libreview account and provided instructions so office can access sugars to more adequately assess BG control -Counseled on a low-carb diet (limiting breads such as cornbread and not drinking fluids with sugar)  Follow up plan: DM call in 1 month BP/DM call in 3 months Pharmacist visit in October   Subjective: [name] [name] is an 72 y.o. year old male who is a primary patient of Philip Aspen, Limmie Patricia, MD.  The care coordination team was consulted for assistance with disease management and care coordination needs.    Engaged with patient face to face for initial visit. Presents with wife in office, and daughter on telephone who helps manage medications. Brings all of his medications (rx and OTC) to appt except his refrigerated insulin products. Is happily retired and helps take care of his 4 grandkids who live locally. 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  Recent office visits: 08/05/22 Philip Aspen, Limmie Patricia, MD - Patient presented for Type 2 diabetes mellitus with diabetic neuropathy without long term current use of insulin and other concerns. No medication changes.   Recent consult visits: 09/01/22 Candelaria Stagers, DPM (Podiatry) - Patient presented for Diabetic polyneuropathy associated with type 2 diabetes mellitus and other concerns. No medication changes.    08/27/22 Barnet Glasgow (Ophthalmology) - Patient presented for Post operative state. No other visit details available.    08/21/22  Barnet Glasgow (Ophthalmology) - Patient presented for Extracapsular cataract phaco  removal with insertion of intraocular lens prosthesis and other concerns. No other visit details available.    08/10/22 Vania Rea (Endo)  - Patient presented for Type 2 diabetes mellitus with hyperglycemia with long term current use of insulin. No medication changes.    08/04/22 Candelaria Stagers, DPM - Patient presented for Vascular abnormality and other concerns. No medication changes.    07/17/22 Paulla Dolly, MD  (Gen Surgery) - Patient presented for Nuclear sclerotic cataract of both eyes and other concerns. Prescribed Drops for both eyes.    07/14/22 Candelaria Stagers, DPM - Patient presented for Vascular abnormality and other concerns. Prescribed Doxycycline Hyclate.    07/07/22 Sanoff, Ronald Pippins and Rennis Harding [facility]) - Patient presented for kidney replaced by transplant and other concerns. No other visit details available.    07/07/22 Paulla Dolly, MD - Patient presented for Cataract of both eyes unspecified cataract type and other concerns. No other visit details available.    06/09/22 Barthel Osa Craver - Patient presented for Malignant neoplasm of prostate and other concerns. No other visit details available.    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 No other visit details available.    04/17/22 Arsenio Loader, MD  ([facility]) - Patient presented for Kidney txp clinic follow up. No medication changes.    04/17/22 Hodulik, Birdena Jubilee and Ortel, Huston Foley - (Hematology) - Patient presented for History of pulmonary embolus and other concerns. No other visit details available.    04/08/22 Helane Gunther, DPM (Podiatry) -  Patient presented for Pain due to onychomycosis of toenails of both feet and other concerns. No medication changes.    03/30/22 Tonny Bollman - Claims encounter for  syncope and collapse and other concerns. No other visit details available.   Hospital visits: None in previous 6 months   Objective:  Lab Results  Component Value Date   CREATININE 2.70 (H) 08/05/2022   BUN 47 (H) 08/05/2022   GFR 22.66 (L) 08/05/2022   GFRNONAA 19 (L) 01/13/2022   GFRAA 41 (L) 01/16/2020   NA 140 08/05/2022   K 4.9 08/05/2022   CALCIUM 9.7 08/05/2022   CO2 25 08/05/2022   GLUCOSE 163 (H) 08/05/2022    Lab Results  Component Value Date/Time   HGBA1C 8.3 (H) 08/05/2022 08:53 AM   HGBA1C 9.0 03/16/2022 12:00 AM   HGBA1C 8.7 11/27/2021 12:00 AM   GFR 22.66 (L) 08/05/2022 08:53 AM   GFR 18.83 (L) 01/16/2022 08:33 AM   MICROALBUR 13.5 (H) 12/03/2021 12:52 PM   MICROALBUR 243.9 (H) 09/24/2015 11:27 AM    Last diabetic Eye exam:  Lab Results  Component Value Date/Time   HMDIABEYEEXA Retinopathy (A) 06/19/2015 12:00 AM    Last diabetic Foot exam:  Lab Results  Component Value Date/Time   HMDIABFOOTEX yes 06/14/2009 12:00 AM     Lab Results  Component Value Date   CHOL 156 08/05/2022   HDL 62.90 08/05/2022   LDLCALC 62 08/05/2022   LDLDIRECT 68.0 06/03/2021   TRIG 158.0 (H) 08/05/2022   CHOLHDL 2 08/05/2022       Latest Ref Rng & Units 08/05/2022    8:53 AM 01/16/2022    8:33 AM 01/13/2022    6:23 PM  Hepatic Function  Total Protein 6.0 - 8.3 g/dL 7.3  7.5  6.8   Albumin 3.5 - 5.2 g/dL 4.2  4.1  3.7   AST 0 - 37 U/L 12  13  17    ALT 0 - 53 U/L 11  12  11    Alk Phosphatase 39 - 117 U/L 83  69  54   Total Bilirubin 0.2 - 1.2 mg/dL 0.3  0.3  0.5     Lab Results  Component Value Date/Time   TSH 2.75 08/05/2022 08:53 AM   TSH 2.061 09/25/2020 10:30 PM   TSH 0.385 07/06/2020 04:59 AM   TSH 2.22 05/22/2015 08:20 AM   FREET4 0.75 09/26/2020 01:01 AM       Latest Ref Rng & Units 08/05/2022    8:53 AM 01/13/2022    6:23 PM 01/07/2022   12:03 PM  CBC  WBC 4.0 - 10.5 K/uL 6.0  6.8  5.1   Hemoglobin 13.0 - 17.0 g/dL 47.8  29.5  62.1   Hematocrit  39.0 - 52.0 % 35.3  35.9  35.7   Platelets 150.0 - 400.0 K/uL 244.0  202  204     Lab Results  Component Value Date/Time   VD25OH 10.85 (L) 08/05/2022 08:53 AM   VD25OH 6.78 (L) 06/11/2019 02:39 AM   VITAMINB12 334 08/05/2022 08:53 AM   VITAMINB12 1,206 (H) 09/26/2020 01:01 AM    Clinical ASCVD: Yes  The ASCVD Risk score (Arnett DK, et al., 2019) failed to calculate for the following reasons:   The patient has a prior MI or stroke diagnosis       08/05/2022    9:16 AM 01/16/2022    8:54 AM 12/03/2021   10:46 AM  Depression screen PHQ 2/9  Decreased Interest 0 0 0  Down, Depressed, Hopeless 0 0 0  PHQ - 2 Score 0 0 0  Altered sleeping 0 1 1  Tired, decreased energy 0 0 1  Change in appetite 0 1 0  Feeling bad or failure about yourself  0 0 0  Trouble concentrating 0 0 0  Moving slowly or fidgety/restless 0 0 0  Suicidal thoughts 0 0 0  PHQ-9 Score 0 2 2  Difficult doing work/chores  Not difficult at all Not difficult at all     Social History   Tobacco Use  Smoking Status Former   Packs/day: 0.12   Years: 10.00   Additional pack years: 0.00   Total pack years: 1.20   Types: Cigarettes   Quit date: 04/27/1986   Years since quitting: 36.4  Smokeless Tobacco Never  Tobacco Comments   07/28/2012 "quit smoking cigarettes 20-30 yr ago"   BP Readings from Last 3 Encounters:  08/05/22 124/70  03/17/22 124/60  01/16/22 126/73   Pulse Readings from Last 3 Encounters:  08/05/22 75  03/17/22 76  01/16/22 76   Wt Readings from Last 3 Encounters:  08/05/22 157 lb 11.2 oz (71.5 kg)  03/30/22 138 lb (62.6 kg)  03/17/22 138 lb (62.6 kg)   BMI Readings from Last 3 Encounters:  08/05/22 23.29 kg/m  03/30/22 20.38 kg/m  03/17/22 20.09 kg/m    Allergies  Allergen Reactions   Other Rash    Oxyquinoline-white Pet-lanolin   Aquamed Rash   Bag Balm  [Albolene] Rash   Cold Cream Rash    Medications Reviewed Today     Reviewed by Sherrill Raring, RPH  (Pharmacist) on 10/02/22 at 0935  Med List Status: <None>   Medication Order Taking? Sig Documenting Provider Last Dose Status Informant  Alcohol Swabs (PHARMACIST CHOICE ALCOHOL) PADS [id number]  SMARTSIG:1 Each Topical 4 Times Daily [provider]  Active   amLODipine (NORVASC) 2.5 MG tablet [id number] Yes Take 2.5 mg by mouth daily. [provider] Taking Active Child  apixaban (ELIQUIS) 5 MG TABS tablet [id number] Yes Take 5 mg by mouth 2 (two) times daily. [provider] Taking Active Child  Patient not taking:  Discontinued 10/02/22 0933 (Patient Preference)   BAQSIMI ONE PACK 3 MG/DOSE POWD [id number]  Place 1 spray into the nose daily as needed for low blood sugar. [provider]  Active Child  Patient not taking:  Discontinued 10/02/22 0933 (Patient Preference)   carvedilol (COREG) 6.25 MG tablet [id number] Yes Take 6.25 mg by mouth 2 (two) times daily. [provider] Taking Active Child  COMFORT EZ PEN NEEDLES 32G X 4 MM MISC [id number]   [provider]  Active     Discontinued 10/02/22 [phone] (Patient Preference)   famotidine (PEPCID) 20 MG tablet [id number] Yes Take 20 mg by mouth 2 (two) times daily. [provider] Taking Active Child  folic acid (FOLVITE) 1 MG tablet [id number] Yes Take 1 mg by mouth daily. [provider] Taking Active Child  furosemide (LASIX) 40 MG tablet [id number] Yes Take 40 mg by mouth daily. [provider] Taking Active Child    Patient not taking:  Discontinued 10/02/22 0934 (Completed Course)   Patient not taking:  Discontinued 10/02/22 0934 (Change in therapy)   insulin aspart (NOVOLOG) 100 UNIT/ML injection [id number] Yes Inject 0-15 Units into the skin 4 (four) times daily - after meals and at bedtime.  Patient taking differently: Inject 6 Units into the skin 3 (three) times daily. Per sliding scale  Drema Dallas, MD Taking Active Child  insulin glargine (LANTUS SOLOSTAR)  100 UNIT/ML Solostar Pen [id number] Yes Inject 5 Units into the skin at bedtime. [provider] Taking Active Child           Med Note Maple Hudson, TIERICA T   Tue Mar 17, 2022 10:19 AM)    Melatonin 10 MG TABS [id number] Yes Take 10 mg by mouth at bedtime. [provider] Taking Active Child    Discontinued 10/02/22 [phone] (Completed Course)   predniSONE (DELTASONE) 5 MG tablet [id number] Yes Take 10 mg by mouth daily with breakfast. [provider] Taking Active Child  rosuvastatin (CRESTOR) 20 MG tablet [id number] Yes TAKE 1 TABLET BY MOUTH EVERY DAY Philip Aspen, Limmie Patricia, MD Taking Active   sevelamer carbonate (RENVELA) 800 MG tablet [id number] No   Patient not taking: Reported on 10/02/2022   [provider] Not Taking Active   Discontinued 10/02/22 0934 (Patient Preference) Patient not taking:  Discontinued 10/02/22 0934 (Patient Preference)   thiamine 100 MG tablet [id number] Yes Take 100 mg by mouth daily. [provider] Taking Active Child  Vitamin D, Ergocalciferol, (DRISDOL) 1.25 MG (50000 UNIT) CAPS capsule [id number] Yes Take 1 capsule (50,000 Units total) by mouth every 7 (seven) days for 12 doses. Philip Aspen, Limmie Patricia, MD Taking Active             SDOH:  (Social Determinants of Health) assessments and interventions performed: Yes SDOH Interventions    Flowsheet Row Care Coordination from 10/02/2022 in [facility] Office Visit from 08/05/2022 in [facility] [place] Office Visit from 01/16/2022 in [facility] [place] Office Visit from 12/03/2021 in [facility] [place]  SDOH Interventions      Food Insecurity Interventions Intervention Not Indicated Intervention Not Indicated -- --  Housing Interventions Intervention Not Indicated Intervention Not Indicated -- --  Depression Interventions/Treatment  -- -- ZDG3-8 Score <4 Follow-up Not Indicated PHQ2-9  Score <4 Follow-up Not Indicated       Medication Assistance: None required.  Patient affirms current coverage meets needs.  Medication Access: Within the past 30 days, how often has patient missed a dose of medication? None Is a pillbox or other method used to improve adherence? Yes  Factors that may affect medication adherence?  Unable to handle own meds, relies on daughter who still works full time Are meds synced by current pharmacy? No  Are meds delivered by current pharmacy? No  Does patient experience delays in picking up medications due to transportation concerns? No   [facility] Reviewed: Is patient disadvantaged to use [facility]?: Yes  Current Rx insurance plan: UHC/BCBS Name and location of Current pharmacy:  [facility] - [place], [place] - [address] [place] [address] Phone: [phone] Fax: [phone]  [id number] - [place], [place] - [address] AT [facility] [address] [place] [address] Phone: [phone] Fax: [phone]  [facility] [address] [place] [address] Phone: [phone] Fax: [phone]  [facility] services reviewed with patient today?: No  Patient requests to transfer care to [facility]?: No  Reason patient declined to change pharmacies: Disadvantaged due to insurance/mail order  Compliance/Adherence/Medication fill history: Care Gaps: Eye Exam - Overdue COVID Booster - Overdue Zoster Vaccine - Postponed AWV - 08/05/22    [facility]: Rosuvastatin 20 mg  - [facility]  [facility] Telmisartan 20 mg - NO fill hx avail   Assessment/Plan Hypertension (BP goal <130/80) -Controlled -Current treatment: Amlodipine 2.5mg  1 tab qd Appropriate, Effective, Safe, Accessible Carvedilol 6.25mg  BID Appropriate, Effective, Safe, Accessible Lasix 40mg  1 qd Appropriate,  Effective, Safe, Accessible Telmisartan 20mg  1 qd ---does not have and has not been taking for years -Medications previously tried: Atenolol, HCTZ, Losartan, Metoprolol, Torsemide -Current home readings: has a machine at home but has not been checking -Current dietary habits: see DM section -Current exercise habits: did not discuss -Denies hypotensive/hypertensive symptoms -Educated on BP goals and benefits of medications for prevention of heart attack, stroke and kidney damage; Daily salt intake goal < 2300 mg; Importance of home blood pressure monitoring; Proper BP monitoring technique; -Counseled to monitor BP at home weekly, document, and provide log at future appointments -Counseled on diet and exercise extensively Recommended to continue current medication  Hyperlipidemia: (LDL goal < 70) -Controlled -Current treatment: Rosuvastatin 20mg  1 qd Appropriate, Effective, Safe, Accessible -Medications previously tried: Pravastatin  -Current dietary patterns: see DM section -Educated on Cholesterol goals;  Benefits of statin for ASCVD risk reduction; Importance of limiting foods high in cholesterol; -Counseled on diet and exercise extensively Recommended to continue current medication  Diabetes (A1c goal <7%) -Uncontrolled -Current medications: Lantus 11 units into the skin at bedtime Appropriate, Effective, Safe, Accessible Insulin aspart 3 units TID AC with SS Appropriate, Query Effective -Medications previously tried: Glipizide, Metformin, Januvia -Current home glucose readings fasting glucose: 55 this morning, corrected to 106 with a piece of candy post prandial glucose: cannot recall, does not have sensor reader with him and does not have a log -Denies hypoglycemic/hyperglycemic symptoms -Current meal patterns:  Brunch: eggs, sausage, grits dinner: baked meat and veggies, likes cornbread snacks: wife reports he sneaks the grandkids snacks such as oatmeal pies drinks:  water, zero sugar soda, "Kool-aid" like drink that wife tries to limit -Current exercise: not discussed -Educated on A1c and blood sugar goals; Complications of diabetes including kidney damage, retinal damage, and cardiovascular disease; Proper insulin injection technique; Prevention and management of hypoglycemic episodes; Continuous glucose monitoring; Carbohydrate counting and/or plate method -Counseled to check feet daily and get yearly eye exams -Counseled on diet and exercise extensively Recommended to continue current medication Recommended signing up for libreview account with daughter's assistance so that office can have access to sugar readings, provided instructions  CAD/Hx of DVT/PE (Goal: Slow progression of atherosclerosis (plaques / blockages) throughout your body to reduce risk of heart attack and strokes) -Not assessed today Current Medication Therapy: Eliquis 5mg  BID Appropriate, Effective, Safe, Accessibl   Kidney Transplant (Goal: Prevent rejection of donated kidney) -Not assessed today -Current treatment  Tacrolimus 1mg  1 BID Appropriate, Effective, Safe, Accessible  Vit D Def (Goal: Vit D WNL) -Not assessed today -Current treatment  Vit D 50,000 units once weekly Appropriate, Query effective Has not completed course to have updated labs  Malnutrition  -Not assessed today -Current treatment  Thiamine 100mg  1 qd Appropriate, Effective, Safe, Accessible Folic Acid 1mg  1 qd Appropriate, Effective, Safe, Accessible   Sherrill Raring Clinical Pharmacist [phone]

## 2022-10-02 ENCOUNTER — Ambulatory Visit: Payer: Medicare Other

## 2022-10-20 ENCOUNTER — Other Ambulatory Visit: Payer: Self-pay | Admitting: Internal Medicine

## 2022-10-20 DIAGNOSIS — E559 Vitamin D deficiency, unspecified: Secondary | ICD-10-CM

## 2022-10-23 DIAGNOSIS — N186 End stage renal disease: Secondary | ICD-10-CM | POA: Diagnosis not present

## 2022-10-23 DIAGNOSIS — I120 Hypertensive chronic kidney disease with stage 5 chronic kidney disease or end stage renal disease: Secondary | ICD-10-CM | POA: Diagnosis not present

## 2022-10-23 DIAGNOSIS — Z86718 Personal history of other venous thrombosis and embolism: Secondary | ICD-10-CM | POA: Diagnosis not present

## 2022-10-23 DIAGNOSIS — E1165 Type 2 diabetes mellitus with hyperglycemia: Secondary | ICD-10-CM | POA: Diagnosis not present

## 2022-10-23 DIAGNOSIS — Z8661 Personal history of infections of the central nervous system: Secondary | ICD-10-CM | POA: Diagnosis not present

## 2022-10-23 DIAGNOSIS — Z7901 Long term (current) use of anticoagulants: Secondary | ICD-10-CM | POA: Diagnosis not present

## 2022-10-23 DIAGNOSIS — Z8673 Personal history of transient ischemic attack (TIA), and cerebral infarction without residual deficits: Secondary | ICD-10-CM | POA: Diagnosis not present

## 2022-10-23 DIAGNOSIS — Z86711 Personal history of pulmonary embolism: Secondary | ICD-10-CM | POA: Diagnosis not present

## 2022-10-23 DIAGNOSIS — D638 Anemia in other chronic diseases classified elsewhere: Secondary | ICD-10-CM | POA: Diagnosis not present

## 2022-10-23 DIAGNOSIS — E1122 Type 2 diabetes mellitus with diabetic chronic kidney disease: Secondary | ICD-10-CM | POA: Diagnosis not present

## 2022-10-23 DIAGNOSIS — Z87891 Personal history of nicotine dependence: Secondary | ICD-10-CM | POA: Diagnosis not present

## 2022-10-23 DIAGNOSIS — Z940 Kidney transplant status: Secondary | ICD-10-CM | POA: Diagnosis not present

## 2022-10-25 ENCOUNTER — Other Ambulatory Visit: Payer: Self-pay | Admitting: Internal Medicine

## 2022-10-25 DIAGNOSIS — E559 Vitamin D deficiency, unspecified: Secondary | ICD-10-CM

## 2022-11-05 DIAGNOSIS — Z940 Kidney transplant status: Secondary | ICD-10-CM | POA: Diagnosis not present

## 2022-11-05 DIAGNOSIS — Z79899 Other long term (current) drug therapy: Secondary | ICD-10-CM | POA: Diagnosis not present

## 2022-11-05 DIAGNOSIS — I10 Essential (primary) hypertension: Secondary | ICD-10-CM | POA: Diagnosis not present

## 2022-11-05 DIAGNOSIS — Z7952 Long term (current) use of systemic steroids: Secondary | ICD-10-CM | POA: Diagnosis not present

## 2022-11-05 DIAGNOSIS — Z87891 Personal history of nicotine dependence: Secondary | ICD-10-CM | POA: Diagnosis not present

## 2022-11-05 DIAGNOSIS — Z7901 Long term (current) use of anticoagulants: Secondary | ICD-10-CM | POA: Diagnosis not present

## 2022-11-05 DIAGNOSIS — Z86718 Personal history of other venous thrombosis and embolism: Secondary | ICD-10-CM | POA: Diagnosis not present

## 2022-11-05 DIAGNOSIS — Z7960 Long term (current) use of unspecified immunomodulators and immunosuppressants: Secondary | ICD-10-CM | POA: Diagnosis not present

## 2022-11-05 DIAGNOSIS — Z86711 Personal history of pulmonary embolism: Secondary | ICD-10-CM | POA: Diagnosis not present

## 2022-11-05 DIAGNOSIS — E1136 Type 2 diabetes mellitus with diabetic cataract: Secondary | ICD-10-CM | POA: Diagnosis not present

## 2022-11-05 DIAGNOSIS — Z8673 Personal history of transient ischemic attack (TIA), and cerebral infarction without residual deficits: Secondary | ICD-10-CM | POA: Diagnosis not present

## 2022-11-05 DIAGNOSIS — Z4822 Encounter for aftercare following kidney transplant: Secondary | ICD-10-CM | POA: Diagnosis not present

## 2022-11-05 DIAGNOSIS — Z794 Long term (current) use of insulin: Secondary | ICD-10-CM | POA: Diagnosis not present

## 2022-11-05 DIAGNOSIS — B451 Cerebral cryptococcosis: Secondary | ICD-10-CM | POA: Diagnosis not present

## 2022-11-05 DIAGNOSIS — D849 Immunodeficiency, unspecified: Secondary | ICD-10-CM | POA: Diagnosis not present

## 2022-11-05 DIAGNOSIS — D84821 Immunodeficiency due to drugs: Secondary | ICD-10-CM | POA: Diagnosis not present

## 2022-11-05 DIAGNOSIS — E7800 Pure hypercholesterolemia, unspecified: Secondary | ICD-10-CM | POA: Diagnosis not present

## 2022-11-08 DIAGNOSIS — Z940 Kidney transplant status: Secondary | ICD-10-CM | POA: Diagnosis not present

## 2022-11-18 DIAGNOSIS — Z992 Dependence on renal dialysis: Secondary | ICD-10-CM | POA: Diagnosis not present

## 2022-11-18 DIAGNOSIS — I70213 Atherosclerosis of native arteries of extremities with intermittent claudication, bilateral legs: Secondary | ICD-10-CM | POA: Diagnosis not present

## 2022-11-18 DIAGNOSIS — N186 End stage renal disease: Secondary | ICD-10-CM | POA: Diagnosis not present

## 2022-11-18 DIAGNOSIS — I739 Peripheral vascular disease, unspecified: Secondary | ICD-10-CM | POA: Diagnosis not present

## 2022-11-24 DIAGNOSIS — Z87891 Personal history of nicotine dependence: Secondary | ICD-10-CM | POA: Diagnosis not present

## 2022-11-24 DIAGNOSIS — I10 Essential (primary) hypertension: Secondary | ICD-10-CM | POA: Diagnosis not present

## 2022-11-24 DIAGNOSIS — Z8673 Personal history of transient ischemic attack (TIA), and cerebral infarction without residual deficits: Secondary | ICD-10-CM | POA: Diagnosis not present

## 2022-11-24 DIAGNOSIS — E7800 Pure hypercholesterolemia, unspecified: Secondary | ICD-10-CM | POA: Diagnosis not present

## 2022-12-01 ENCOUNTER — Ambulatory Visit: Payer: Medicare Other | Admitting: Internal Medicine

## 2022-12-01 VITALS — BP 120/64 | HR 70 | Temp 98.2°F | Wt 159.1 lb

## 2022-12-01 DIAGNOSIS — R413 Other amnesia: Secondary | ICD-10-CM

## 2022-12-01 NOTE — Progress Notes (Signed)
Established Patient Office Visit     CC/Reason for Visit: Memory deficit  HPI: [name] [name] is a 72 y.o. male who is coming in today for the above mentioned reasons.  His daughter scheduled this visit for him due to concerns about progressive memory loss.  They are concerned because he had cryptococcal meningitis in 2022 with an wonder if this could be related.  He has issues mainly remembering names.   Past Medical/Surgical History: Past Medical History:  Diagnosis Date   Anemia    as a younger man   Arthritis    hands   Cataract    Chronic kidney disease    "starting to bother me now" (07/28/2012)   Diabetes mellitus     type II   Diarrhea, functional    ED (erectile dysfunction)    Hypertension    Neuropathy    Peripheral neuropathy     Past Surgical History:  Procedure Laterality Date   APPENDECTOMY  1950's   AV FISTULA PLACEMENT Right 05/14/2016   Procedure: ARTERIOVENOUS (AV) FISTULA CREATION- RIGHT ARM;  Surgeon: Nada Libman, MD;  Location: [facility] OR;  Service: Vascular;  Laterality: Right;   COLONOSCOPY     KIDNEY TRANSPLANT Right    PROSTATE SURGERY     UPPER GASTROINTESTINAL ENDOSCOPY      Social History:  reports that he quit smoking about 36 years ago. His smoking use included cigarettes. He started smoking about 46 years ago. He has a 1.2 pack-year smoking history. He has never used smokeless tobacco. He reports that he does not currently use alcohol after a past usage of about 1.0 standard drink of alcohol per week. He reports that he does not use drugs.  Allergies: Allergies  Allergen Reactions   Other Rash    Oxyquinoline-white Pet-lanolin   Aquamed Rash   Bag Balm  [Albolene] Rash   Cold Cream Rash    Family History:  Family History  Problem Relation Age of Onset   Colon polyps Mother    Stroke Mother    Diabetes Father    Diabetes Cousin    Prostate cancer Brother    Other Daughter        kidney problems   Esophageal cancer  Maternal Aunt    Colon cancer Neg Hx    Stomach cancer Neg Hx    Rectal cancer Neg Hx      Current Outpatient Medications:    Alcohol Swabs (PHARMACIST CHOICE ALCOHOL) PADS, SMARTSIG:1 Each Topical 4 Times Daily, Disp: , Rfl:    amLODipine (NORVASC) 2.5 MG tablet, Take 2.5 mg by mouth daily., Disp: , Rfl:    apixaban (ELIQUIS) 5 MG TABS tablet, Take 5 mg by mouth 2 (two) times daily., Disp: , Rfl:    BAQSIMI ONE PACK 3 MG/DOSE POWD, Place 1 spray into the nose daily as needed for low blood sugar., Disp: , Rfl:    carvedilol (COREG) 6.25 MG tablet, Take 6.25 mg by mouth 2 (two) times daily., Disp: , Rfl:    COMFORT EZ PEN NEEDLES 32G X 4 MM MISC, , Disp: , Rfl:    famotidine (PEPCID) 20 MG tablet, Take 20 mg by mouth 2 (two) times daily., Disp: , Rfl:    folic acid (FOLVITE) 1 MG tablet, Take 1 mg by mouth daily., Disp: , Rfl:    furosemide (LASIX) 40 MG tablet, Take 40 mg by mouth daily., Disp: , Rfl:    insulin aspart (NOVOLOG) 100 UNIT/ML injection,  Inject 0-15 Units into the skin 4 (four) times daily - after meals and at bedtime. (Patient taking differently: Inject 6 Units into the skin 3 (three) times daily. Per sliding scale), Disp: 10 mL, Rfl: 0   insulin glargine (LANTUS SOLOSTAR) 100 UNIT/ML Solostar Pen, Inject 5 Units into the skin at bedtime., Disp: , Rfl:    Melatonin 10 MG TABS, Take 10 mg by mouth at bedtime., Disp: , Rfl:    predniSONE (DELTASONE) 5 MG tablet, Take 10 mg by mouth daily with breakfast., Disp: , Rfl:    rosuvastatin (CRESTOR) 20 MG tablet, TAKE 1 TABLET BY MOUTH EVERY DAY, Disp: 90 tablet, Rfl: 1   sevelamer carbonate (RENVELA) 800 MG tablet, , Disp: , Rfl:    thiamine 100 MG tablet, Take 100 mg by mouth daily., Disp: , Rfl:   Review of Systems:  Negative unless indicated in HPI.   Physical Exam: Vitals:   12/01/22 1408  BP: 120/64  Pulse: 70  Temp: 98.2 F (36.8 C)  TempSrc: Oral  SpO2: 99%  Weight: 159 lb 1.6 oz (72.2 kg)    Body mass index is  23.49 kg/m.   Physical Exam Vitals reviewed.  Constitutional:      Appearance: Normal appearance.  HENT:     Head: Normocephalic and atraumatic.  Eyes:     Conjunctiva/sclera: Conjunctivae normal.     Pupils: Pupils are equal, round, and reactive to light.  Skin:    General: Skin is warm and dry.  Neurological:     General: No focal deficit present.     Mental Status: He is alert and oriented to person, place, and time.  Psychiatric:        Mood and Affect: Mood normal.        Behavior: Behavior normal.        Thought Content: Thought content normal.        Judgment: Judgment normal.      Impression and Plan:  Memory loss -     Ambulatory referral to Neurology      12/01/2022    2:16 PM  MMSE - Mini Mental State Exam  Orientation to time 3  Orientation to Place 5  Registration 3  Attention/ Calculation 5  Recall 3  Language- name 2 objects 2  Language- repeat 1  Language- follow 3 step command 3  Language- read & follow direction 1  Write a sentence 1  Copy design 1  Total score 28    -MMSE 28 out of 30.  Recent TSH, B12 and vitamin D levels within normal limits. -Due to family's concern referral to neurology placed.  Time spent:22 minutes reviewing chart, interviewing and examining patient and formulating plan of care.     Chaya Jan, MD [facility]

## 2022-12-02 DIAGNOSIS — Z8673 Personal history of transient ischemic attack (TIA), and cerebral infarction without residual deficits: Secondary | ICD-10-CM | POA: Diagnosis not present

## 2022-12-02 DIAGNOSIS — I10 Essential (primary) hypertension: Secondary | ICD-10-CM | POA: Diagnosis not present

## 2022-12-16 DIAGNOSIS — I10 Essential (primary) hypertension: Secondary | ICD-10-CM | POA: Diagnosis not present

## 2022-12-16 DIAGNOSIS — Z8673 Personal history of transient ischemic attack (TIA), and cerebral infarction without residual deficits: Secondary | ICD-10-CM | POA: Diagnosis not present

## 2022-12-25 DIAGNOSIS — E1165 Type 2 diabetes mellitus with hyperglycemia: Secondary | ICD-10-CM | POA: Diagnosis not present

## 2023-02-01 DIAGNOSIS — Z8673 Personal history of transient ischemic attack (TIA), and cerebral infarction without residual deficits: Secondary | ICD-10-CM | POA: Diagnosis not present

## 2023-02-01 DIAGNOSIS — I10 Essential (primary) hypertension: Secondary | ICD-10-CM | POA: Diagnosis not present

## 2023-02-02 ENCOUNTER — Encounter: Payer: Self-pay | Admitting: Internal Medicine

## 2023-02-04 DIAGNOSIS — Z8673 Personal history of transient ischemic attack (TIA), and cerebral infarction without residual deficits: Secondary | ICD-10-CM | POA: Diagnosis not present

## 2023-02-04 DIAGNOSIS — Z86718 Personal history of other venous thrombosis and embolism: Secondary | ICD-10-CM | POA: Diagnosis not present

## 2023-02-04 DIAGNOSIS — E7841 Elevated Lipoprotein(a): Secondary | ICD-10-CM | POA: Diagnosis not present

## 2023-02-08 DIAGNOSIS — D849 Immunodeficiency, unspecified: Secondary | ICD-10-CM | POA: Diagnosis not present

## 2023-02-08 DIAGNOSIS — Z940 Kidney transplant status: Secondary | ICD-10-CM | POA: Diagnosis not present

## 2023-02-17 ENCOUNTER — Telehealth: Payer: Self-pay

## 2023-02-17 NOTE — Progress Notes (Signed)
02/17/2023  Patient ID: [name] [name], male   DOB: October 30, 1950, 72 y.o.   MRN: [id number]  Attempted to contact patient for scheduled appointment for medication management. Unable to leave voicemail as it was full.  Sherrill Raring, PharmD Clinical Pharmacist [phone]

## 2023-02-26 ENCOUNTER — Telehealth: Payer: Self-pay

## 2023-02-26 NOTE — Progress Notes (Signed)
02/26/2023  Patient ID: [name] [name], male   DOB: May 20, 1950, 72 y.o.   MRN: [id number]  Attempted to contact patient to schedule a f/u appointment for medication management. Left HIPAA compliant message for patient to return my call at their convenience.   Sherrill Raring, PharmD Clinical Pharmacist [phone]

## 2023-02-27 ENCOUNTER — Other Ambulatory Visit: Payer: Self-pay | Admitting: Internal Medicine

## 2023-03-09 DIAGNOSIS — Z23 Encounter for immunization: Secondary | ICD-10-CM | POA: Diagnosis not present

## 2023-03-09 DIAGNOSIS — Z7985 Long-term (current) use of injectable non-insulin antidiabetic drugs: Secondary | ICD-10-CM | POA: Diagnosis not present

## 2023-03-09 DIAGNOSIS — E1165 Type 2 diabetes mellitus with hyperglycemia: Secondary | ICD-10-CM | POA: Diagnosis not present

## 2023-03-09 DIAGNOSIS — Z794 Long term (current) use of insulin: Secondary | ICD-10-CM | POA: Diagnosis not present

## 2023-03-10 ENCOUNTER — Telehealth: Payer: Self-pay

## 2023-03-10 NOTE — Progress Notes (Signed)
03/10/2023  Patient ID: [name] [name], male   DOB: 14-Oct-1950, 72 y.o.   MRN: [id number]  Attempted to contact patient to schedule a follow up appointment for medication management. Left HIPAA compliant message for patient to return my call at their convenience.    Sherrill Raring, PharmD Clinical Pharmacist [phone]

## 2023-03-11 DIAGNOSIS — I2699 Other pulmonary embolism without acute cor pulmonale: Secondary | ICD-10-CM | POA: Diagnosis not present

## 2023-03-11 DIAGNOSIS — Z8673 Personal history of transient ischemic attack (TIA), and cerebral infarction without residual deficits: Secondary | ICD-10-CM | POA: Diagnosis not present

## 2023-04-01 DIAGNOSIS — I517 Cardiomegaly: Secondary | ICD-10-CM | POA: Diagnosis not present

## 2023-04-01 DIAGNOSIS — Z8673 Personal history of transient ischemic attack (TIA), and cerebral infarction without residual deficits: Secondary | ICD-10-CM | POA: Diagnosis not present

## 2023-04-01 DIAGNOSIS — I2699 Other pulmonary embolism without acute cor pulmonale: Secondary | ICD-10-CM | POA: Diagnosis not present

## 2023-04-01 DIAGNOSIS — I82409 Acute embolism and thrombosis of unspecified deep veins of unspecified lower extremity: Secondary | ICD-10-CM | POA: Diagnosis not present

## 2023-04-09 DIAGNOSIS — I639 Cerebral infarction, unspecified: Secondary | ICD-10-CM | POA: Diagnosis not present

## 2023-04-09 DIAGNOSIS — E1165 Type 2 diabetes mellitus with hyperglycemia: Secondary | ICD-10-CM | POA: Diagnosis not present

## 2023-05-07 DIAGNOSIS — D849 Immunodeficiency, unspecified: Secondary | ICD-10-CM | POA: Diagnosis not present

## 2023-05-07 DIAGNOSIS — Z4822 Encounter for aftercare following kidney transplant: Secondary | ICD-10-CM | POA: Diagnosis not present

## 2023-05-07 DIAGNOSIS — Z23 Encounter for immunization: Secondary | ICD-10-CM | POA: Diagnosis not present

## 2023-05-07 DIAGNOSIS — E139 Other specified diabetes mellitus without complications: Secondary | ICD-10-CM | POA: Diagnosis not present

## 2023-05-07 DIAGNOSIS — I151 Hypertension secondary to other renal disorders: Secondary | ICD-10-CM | POA: Diagnosis not present

## 2023-05-07 DIAGNOSIS — N184 Chronic kidney disease, stage 4 (severe): Secondary | ICD-10-CM | POA: Diagnosis not present

## 2023-05-07 DIAGNOSIS — Z940 Kidney transplant status: Secondary | ICD-10-CM | POA: Diagnosis not present

## 2023-05-07 DIAGNOSIS — N179 Acute kidney failure, unspecified: Secondary | ICD-10-CM | POA: Diagnosis not present

## 2023-05-07 DIAGNOSIS — Z48298 Encounter for aftercare following other organ transplant: Secondary | ICD-10-CM | POA: Diagnosis not present

## 2023-05-13 DIAGNOSIS — Z4509 Encounter for adjustment and management of other cardiac device: Secondary | ICD-10-CM | POA: Diagnosis not present

## 2023-05-21 DIAGNOSIS — Z794 Long term (current) use of insulin: Secondary | ICD-10-CM | POA: Diagnosis not present

## 2023-05-21 DIAGNOSIS — E1165 Type 2 diabetes mellitus with hyperglycemia: Secondary | ICD-10-CM | POA: Diagnosis not present

## 2023-05-21 DIAGNOSIS — Z79899 Other long term (current) drug therapy: Secondary | ICD-10-CM | POA: Diagnosis not present

## 2023-05-25 DIAGNOSIS — I739 Peripheral vascular disease, unspecified: Secondary | ICD-10-CM | POA: Diagnosis not present

## 2023-05-25 DIAGNOSIS — I70213 Atherosclerosis of native arteries of extremities with intermittent claudication, bilateral legs: Secondary | ICD-10-CM | POA: Diagnosis not present

## 2023-06-04 DIAGNOSIS — E1165 Type 2 diabetes mellitus with hyperglycemia: Secondary | ICD-10-CM | POA: Diagnosis not present

## 2023-06-04 DIAGNOSIS — Z79899 Other long term (current) drug therapy: Secondary | ICD-10-CM | POA: Diagnosis not present

## 2023-06-04 DIAGNOSIS — T380X5A Adverse effect of glucocorticoids and synthetic analogues, initial encounter: Secondary | ICD-10-CM | POA: Diagnosis not present

## 2023-06-13 DIAGNOSIS — Z4509 Encounter for adjustment and management of other cardiac device: Secondary | ICD-10-CM | POA: Diagnosis not present

## 2023-07-05 DIAGNOSIS — Z79899 Other long term (current) drug therapy: Secondary | ICD-10-CM | POA: Diagnosis not present

## 2023-07-05 DIAGNOSIS — R739 Hyperglycemia, unspecified: Secondary | ICD-10-CM | POA: Diagnosis not present

## 2023-07-05 DIAGNOSIS — T380X5A Adverse effect of glucocorticoids and synthetic analogues, initial encounter: Secondary | ICD-10-CM | POA: Diagnosis not present

## 2023-07-08 DIAGNOSIS — E1165 Type 2 diabetes mellitus with hyperglycemia: Secondary | ICD-10-CM | POA: Diagnosis not present

## 2023-07-14 DIAGNOSIS — Z4509 Encounter for adjustment and management of other cardiac device: Secondary | ICD-10-CM | POA: Diagnosis not present

## 2023-08-01 ENCOUNTER — Other Ambulatory Visit: Payer: Self-pay | Admitting: Internal Medicine

## 2023-08-04 ENCOUNTER — Encounter: Payer: Self-pay | Admitting: Internal Medicine

## 2023-08-04 ENCOUNTER — Encounter: Admitting: Internal Medicine

## 2023-08-04 DIAGNOSIS — Z7901 Long term (current) use of anticoagulants: Secondary | ICD-10-CM | POA: Diagnosis not present

## 2023-08-04 DIAGNOSIS — Z5181 Encounter for therapeutic drug level monitoring: Secondary | ICD-10-CM | POA: Diagnosis not present

## 2023-08-04 DIAGNOSIS — Z8673 Personal history of transient ischemic attack (TIA), and cerebral infarction without residual deficits: Secondary | ICD-10-CM | POA: Diagnosis not present

## 2023-08-04 DIAGNOSIS — Z86718 Personal history of other venous thrombosis and embolism: Secondary | ICD-10-CM | POA: Diagnosis not present

## 2023-08-04 DIAGNOSIS — I82432 Acute embolism and thrombosis of left popliteal vein: Secondary | ICD-10-CM | POA: Diagnosis not present

## 2023-08-04 DIAGNOSIS — D638 Anemia in other chronic diseases classified elsewhere: Secondary | ICD-10-CM | POA: Diagnosis not present

## 2023-08-04 NOTE — Progress Notes (Signed)
 This encounter was created in error - please disregard.

## 2023-08-13 DIAGNOSIS — N184 Chronic kidney disease, stage 4 (severe): Secondary | ICD-10-CM | POA: Diagnosis not present

## 2023-08-13 DIAGNOSIS — Z87891 Personal history of nicotine dependence: Secondary | ICD-10-CM | POA: Diagnosis not present

## 2023-08-13 DIAGNOSIS — Z940 Kidney transplant status: Secondary | ICD-10-CM | POA: Diagnosis not present

## 2023-08-13 DIAGNOSIS — Z4822 Encounter for aftercare following kidney transplant: Secondary | ICD-10-CM | POA: Diagnosis not present

## 2023-08-13 DIAGNOSIS — D849 Immunodeficiency, unspecified: Secondary | ICD-10-CM | POA: Diagnosis not present

## 2023-08-13 DIAGNOSIS — D84821 Immunodeficiency due to drugs: Secondary | ICD-10-CM | POA: Diagnosis not present

## 2023-08-13 DIAGNOSIS — E139 Other specified diabetes mellitus without complications: Secondary | ICD-10-CM | POA: Diagnosis not present

## 2023-08-13 DIAGNOSIS — I10 Essential (primary) hypertension: Secondary | ICD-10-CM | POA: Diagnosis not present

## 2023-08-14 DIAGNOSIS — Z4509 Encounter for adjustment and management of other cardiac device: Secondary | ICD-10-CM | POA: Diagnosis not present

## 2023-08-16 DIAGNOSIS — Z940 Kidney transplant status: Secondary | ICD-10-CM | POA: Diagnosis not present

## 2023-08-16 DIAGNOSIS — E1121 Type 2 diabetes mellitus with diabetic nephropathy: Secondary | ICD-10-CM | POA: Diagnosis not present

## 2023-08-16 DIAGNOSIS — Z79899 Other long term (current) drug therapy: Secondary | ICD-10-CM | POA: Diagnosis not present

## 2023-08-16 DIAGNOSIS — N390 Urinary tract infection, site not specified: Secondary | ICD-10-CM | POA: Diagnosis not present

## 2023-08-16 DIAGNOSIS — D849 Immunodeficiency, unspecified: Secondary | ICD-10-CM | POA: Diagnosis not present

## 2023-08-16 DIAGNOSIS — E1165 Type 2 diabetes mellitus with hyperglycemia: Secondary | ICD-10-CM | POA: Diagnosis not present

## 2023-08-16 DIAGNOSIS — E1129 Type 2 diabetes mellitus with other diabetic kidney complication: Secondary | ICD-10-CM | POA: Diagnosis not present

## 2023-08-16 DIAGNOSIS — Z794 Long term (current) use of insulin: Secondary | ICD-10-CM | POA: Diagnosis not present

## 2023-08-16 DIAGNOSIS — Z48298 Encounter for aftercare following other organ transplant: Secondary | ICD-10-CM | POA: Diagnosis not present

## 2023-09-02 DIAGNOSIS — Z4509 Encounter for adjustment and management of other cardiac device: Secondary | ICD-10-CM | POA: Diagnosis not present

## 2023-09-02 DIAGNOSIS — Z7901 Long term (current) use of anticoagulants: Secondary | ICD-10-CM | POA: Diagnosis not present

## 2023-09-02 DIAGNOSIS — Z8673 Personal history of transient ischemic attack (TIA), and cerebral infarction without residual deficits: Secondary | ICD-10-CM | POA: Diagnosis not present

## 2023-09-07 DIAGNOSIS — N186 End stage renal disease: Secondary | ICD-10-CM | POA: Diagnosis not present

## 2023-09-07 DIAGNOSIS — E1165 Type 2 diabetes mellitus with hyperglycemia: Secondary | ICD-10-CM | POA: Diagnosis not present

## 2023-09-07 DIAGNOSIS — I120 Hypertensive chronic kidney disease with stage 5 chronic kidney disease or end stage renal disease: Secondary | ICD-10-CM | POA: Diagnosis not present

## 2023-09-07 DIAGNOSIS — I10 Essential (primary) hypertension: Secondary | ICD-10-CM | POA: Diagnosis not present

## 2023-09-07 DIAGNOSIS — E1122 Type 2 diabetes mellitus with diabetic chronic kidney disease: Secondary | ICD-10-CM | POA: Diagnosis not present

## 2023-09-07 DIAGNOSIS — Z992 Dependence on renal dialysis: Secondary | ICD-10-CM | POA: Diagnosis not present

## 2023-09-07 DIAGNOSIS — Z794 Long term (current) use of insulin: Secondary | ICD-10-CM | POA: Diagnosis not present

## 2023-09-08 DIAGNOSIS — Z940 Kidney transplant status: Secondary | ICD-10-CM | POA: Diagnosis not present

## 2023-09-08 DIAGNOSIS — Z48298 Encounter for aftercare following other organ transplant: Secondary | ICD-10-CM | POA: Diagnosis not present

## 2023-09-08 DIAGNOSIS — N390 Urinary tract infection, site not specified: Secondary | ICD-10-CM | POA: Diagnosis not present

## 2023-09-08 DIAGNOSIS — D849 Immunodeficiency, unspecified: Secondary | ICD-10-CM | POA: Diagnosis not present

## 2023-09-13 DIAGNOSIS — Z8673 Personal history of transient ischemic attack (TIA), and cerebral infarction without residual deficits: Secondary | ICD-10-CM | POA: Diagnosis not present

## 2023-09-13 DIAGNOSIS — I2699 Other pulmonary embolism without acute cor pulmonale: Secondary | ICD-10-CM | POA: Diagnosis not present

## 2023-09-14 DIAGNOSIS — Z4509 Encounter for adjustment and management of other cardiac device: Secondary | ICD-10-CM | POA: Diagnosis not present

## 2023-10-05 DIAGNOSIS — H40013 Open angle with borderline findings, low risk, bilateral: Secondary | ICD-10-CM | POA: Diagnosis not present

## 2023-10-13 DIAGNOSIS — E119 Type 2 diabetes mellitus without complications: Secondary | ICD-10-CM | POA: Diagnosis not present

## 2023-10-15 DIAGNOSIS — Z4509 Encounter for adjustment and management of other cardiac device: Secondary | ICD-10-CM | POA: Diagnosis not present

## 2023-10-29 ENCOUNTER — Other Ambulatory Visit: Payer: Self-pay | Admitting: Internal Medicine

## 2023-11-05 DIAGNOSIS — E559 Vitamin D deficiency, unspecified: Secondary | ICD-10-CM | POA: Diagnosis not present

## 2023-11-05 DIAGNOSIS — I151 Hypertension secondary to other renal disorders: Secondary | ICD-10-CM | POA: Diagnosis not present

## 2023-11-05 DIAGNOSIS — E139 Other specified diabetes mellitus without complications: Secondary | ICD-10-CM | POA: Diagnosis not present

## 2023-11-05 DIAGNOSIS — D849 Immunodeficiency, unspecified: Secondary | ICD-10-CM | POA: Diagnosis not present

## 2023-11-05 DIAGNOSIS — E212 Other hyperparathyroidism: Secondary | ICD-10-CM | POA: Diagnosis not present

## 2023-11-05 DIAGNOSIS — N184 Chronic kidney disease, stage 4 (severe): Secondary | ICD-10-CM | POA: Diagnosis not present

## 2023-11-05 DIAGNOSIS — Z48298 Encounter for aftercare following other organ transplant: Secondary | ICD-10-CM | POA: Diagnosis not present

## 2023-11-05 DIAGNOSIS — Z940 Kidney transplant status: Secondary | ICD-10-CM | POA: Diagnosis not present

## 2023-11-12 DIAGNOSIS — H40001 Preglaucoma, unspecified, right eye: Secondary | ICD-10-CM | POA: Diagnosis not present

## 2023-11-12 DIAGNOSIS — H401122 Primary open-angle glaucoma, left eye, moderate stage: Secondary | ICD-10-CM | POA: Diagnosis not present

## 2023-11-15 DIAGNOSIS — Z940 Kidney transplant status: Secondary | ICD-10-CM | POA: Diagnosis not present

## 2023-11-15 DIAGNOSIS — I129 Hypertensive chronic kidney disease with stage 1 through stage 4 chronic kidney disease, or unspecified chronic kidney disease: Secondary | ICD-10-CM | POA: Diagnosis not present

## 2023-11-15 DIAGNOSIS — Z79899 Other long term (current) drug therapy: Secondary | ICD-10-CM | POA: Diagnosis not present

## 2023-11-15 DIAGNOSIS — N184 Chronic kidney disease, stage 4 (severe): Secondary | ICD-10-CM | POA: Diagnosis not present

## 2023-11-15 DIAGNOSIS — D631 Anemia in chronic kidney disease: Secondary | ICD-10-CM | POA: Diagnosis not present

## 2023-11-15 DIAGNOSIS — Z8619 Personal history of other infectious and parasitic diseases: Secondary | ICD-10-CM | POA: Diagnosis not present

## 2023-11-21 DIAGNOSIS — Z4509 Encounter for adjustment and management of other cardiac device: Secondary | ICD-10-CM | POA: Diagnosis not present

## 2023-11-23 DIAGNOSIS — I739 Peripheral vascular disease, unspecified: Secondary | ICD-10-CM | POA: Diagnosis not present

## 2023-12-07 DIAGNOSIS — I129 Hypertensive chronic kidney disease with stage 1 through stage 4 chronic kidney disease, or unspecified chronic kidney disease: Secondary | ICD-10-CM | POA: Diagnosis not present

## 2023-12-07 DIAGNOSIS — Z940 Kidney transplant status: Secondary | ICD-10-CM | POA: Diagnosis not present

## 2023-12-07 DIAGNOSIS — Z79899 Other long term (current) drug therapy: Secondary | ICD-10-CM | POA: Diagnosis not present

## 2023-12-07 DIAGNOSIS — N189 Chronic kidney disease, unspecified: Secondary | ICD-10-CM | POA: Diagnosis not present

## 2023-12-13 ENCOUNTER — Telehealth: Payer: Self-pay | Admitting: *Deleted

## 2023-12-13 NOTE — Telephone Encounter (Signed)
 Patient was identified as falling into the [place] Measure - Diabetes.   Patient was: Has an appointment scheduled with Dario Lamarr Collet, MD 03/14/24.

## 2023-12-20 DIAGNOSIS — H401122 Primary open-angle glaucoma, left eye, moderate stage: Secondary | ICD-10-CM | POA: Diagnosis not present

## 2023-12-26 DIAGNOSIS — Z95818 Presence of other cardiac implants and grafts: Secondary | ICD-10-CM | POA: Diagnosis not present

## 2024-01-03 DIAGNOSIS — Z940 Kidney transplant status: Secondary | ICD-10-CM | POA: Diagnosis not present

## 2024-01-03 DIAGNOSIS — I129 Hypertensive chronic kidney disease with stage 1 through stage 4 chronic kidney disease, or unspecified chronic kidney disease: Secondary | ICD-10-CM | POA: Diagnosis not present

## 2024-01-11 DIAGNOSIS — E1165 Type 2 diabetes mellitus with hyperglycemia: Secondary | ICD-10-CM | POA: Diagnosis not present

## 2024-01-11 DIAGNOSIS — Z794 Long term (current) use of insulin: Secondary | ICD-10-CM | POA: Diagnosis not present

## 2024-01-13 DIAGNOSIS — Z8619 Personal history of other infectious and parasitic diseases: Secondary | ICD-10-CM | POA: Diagnosis not present

## 2024-01-13 DIAGNOSIS — D631 Anemia in chronic kidney disease: Secondary | ICD-10-CM | POA: Diagnosis not present

## 2024-01-13 DIAGNOSIS — I770 Arteriovenous fistula, acquired: Secondary | ICD-10-CM | POA: Diagnosis not present

## 2024-01-13 DIAGNOSIS — Z940 Kidney transplant status: Secondary | ICD-10-CM | POA: Diagnosis not present

## 2024-01-13 DIAGNOSIS — N184 Chronic kidney disease, stage 4 (severe): Secondary | ICD-10-CM | POA: Diagnosis not present

## 2024-01-13 DIAGNOSIS — E1122 Type 2 diabetes mellitus with diabetic chronic kidney disease: Secondary | ICD-10-CM | POA: Diagnosis not present

## 2024-01-13 DIAGNOSIS — Z79899 Other long term (current) drug therapy: Secondary | ICD-10-CM | POA: Diagnosis not present

## 2024-01-13 DIAGNOSIS — I129 Hypertensive chronic kidney disease with stage 1 through stage 4 chronic kidney disease, or unspecified chronic kidney disease: Secondary | ICD-10-CM | POA: Diagnosis not present

## 2024-01-18 ENCOUNTER — Encounter (HOSPITAL_COMMUNITY): Payer: Self-pay

## 2024-01-18 ENCOUNTER — Emergency Department (HOSPITAL_COMMUNITY)

## 2024-01-18 ENCOUNTER — Other Ambulatory Visit: Payer: Self-pay

## 2024-01-18 ENCOUNTER — Inpatient Hospital Stay (HOSPITAL_COMMUNITY)
Admission: EM | Admit: 2024-01-18 | Discharge: 2024-01-23 | DRG: 698 | Disposition: A | Discharge status: Home / Self Care | Attending: Family Medicine | Admitting: Family Medicine

## 2024-01-18 DIAGNOSIS — R55 Syncope and collapse: Secondary | ICD-10-CM | POA: Diagnosis not present

## 2024-01-18 DIAGNOSIS — N179 Acute kidney failure, unspecified: Secondary | ICD-10-CM | POA: Diagnosis not present

## 2024-01-18 DIAGNOSIS — E119 Type 2 diabetes mellitus without complications: Secondary | ICD-10-CM

## 2024-01-18 DIAGNOSIS — I2489 Other forms of acute ischemic heart disease: Secondary | ICD-10-CM | POA: Diagnosis not present

## 2024-01-18 DIAGNOSIS — E8721 Acute metabolic acidosis: Secondary | ICD-10-CM | POA: Diagnosis present

## 2024-01-18 DIAGNOSIS — Z681 Body mass index (BMI) 19 or less, adult: Secondary | ICD-10-CM

## 2024-01-18 DIAGNOSIS — T8619 Other complication of kidney transplant: Principal | ICD-10-CM | POA: Diagnosis present

## 2024-01-18 DIAGNOSIS — E7849 Other hyperlipidemia: Secondary | ICD-10-CM | POA: Diagnosis not present

## 2024-01-18 DIAGNOSIS — W010XXA Fall on same level from slipping, tripping and stumbling without subsequent striking against object, initial encounter: Secondary | ICD-10-CM | POA: Diagnosis present

## 2024-01-18 DIAGNOSIS — I2699 Other pulmonary embolism without acute cor pulmonale: Secondary | ICD-10-CM | POA: Diagnosis present

## 2024-01-18 DIAGNOSIS — I959 Hypotension, unspecified: Secondary | ICD-10-CM | POA: Diagnosis present

## 2024-01-18 DIAGNOSIS — K5900 Constipation, unspecified: Secondary | ICD-10-CM | POA: Diagnosis not present

## 2024-01-18 DIAGNOSIS — I129 Hypertensive chronic kidney disease with stage 1 through stage 4 chronic kidney disease, or unspecified chronic kidney disease: Secondary | ICD-10-CM | POA: Diagnosis present

## 2024-01-18 DIAGNOSIS — I10 Essential (primary) hypertension: Secondary | ICD-10-CM | POA: Diagnosis present

## 2024-01-18 DIAGNOSIS — Z7952 Long term (current) use of systemic steroids: Secondary | ICD-10-CM

## 2024-01-18 DIAGNOSIS — R636 Underweight: Secondary | ICD-10-CM | POA: Diagnosis present

## 2024-01-18 DIAGNOSIS — M50222 Other cervical disc displacement at C5-C6 level: Secondary | ICD-10-CM | POA: Diagnosis not present

## 2024-01-18 DIAGNOSIS — E1165 Type 2 diabetes mellitus with hyperglycemia: Secondary | ICD-10-CM | POA: Diagnosis not present

## 2024-01-18 DIAGNOSIS — Z7901 Long term (current) use of anticoagulants: Secondary | ICD-10-CM | POA: Diagnosis not present

## 2024-01-18 DIAGNOSIS — M4312 Spondylolisthesis, cervical region: Secondary | ICD-10-CM | POA: Diagnosis not present

## 2024-01-18 DIAGNOSIS — R4182 Altered mental status, unspecified: Secondary | ICD-10-CM | POA: Diagnosis present

## 2024-01-18 DIAGNOSIS — N400 Enlarged prostate without lower urinary tract symptoms: Secondary | ICD-10-CM | POA: Diagnosis present

## 2024-01-18 DIAGNOSIS — G928 Other toxic encephalopathy: Secondary | ICD-10-CM | POA: Diagnosis not present

## 2024-01-18 DIAGNOSIS — D849 Immunodeficiency, unspecified: Secondary | ICD-10-CM | POA: Diagnosis present

## 2024-01-18 DIAGNOSIS — R262 Difficulty in walking, not elsewhere classified: Secondary | ICD-10-CM | POA: Diagnosis not present

## 2024-01-18 DIAGNOSIS — Z7960 Long term (current) use of unspecified immunomodulators and immunosuppressants: Secondary | ICD-10-CM

## 2024-01-18 DIAGNOSIS — Z87891 Personal history of nicotine dependence: Secondary | ICD-10-CM

## 2024-01-18 DIAGNOSIS — G9341 Metabolic encephalopathy: Secondary | ICD-10-CM | POA: Diagnosis not present

## 2024-01-18 DIAGNOSIS — Z888 Allergy status to other drugs, medicaments and biological substances status: Secondary | ICD-10-CM

## 2024-01-18 DIAGNOSIS — M47812 Spondylosis without myelopathy or radiculopathy, cervical region: Secondary | ICD-10-CM | POA: Diagnosis not present

## 2024-01-18 DIAGNOSIS — D631 Anemia in chronic kidney disease: Secondary | ICD-10-CM | POA: Diagnosis present

## 2024-01-18 DIAGNOSIS — Z794 Long term (current) use of insulin: Secondary | ICD-10-CM | POA: Diagnosis not present

## 2024-01-18 DIAGNOSIS — Z833 Family history of diabetes mellitus: Secondary | ICD-10-CM | POA: Diagnosis not present

## 2024-01-18 DIAGNOSIS — N184 Chronic kidney disease, stage 4 (severe): Secondary | ICD-10-CM | POA: Diagnosis present

## 2024-01-18 DIAGNOSIS — D84821 Immunodeficiency due to drugs: Secondary | ICD-10-CM | POA: Diagnosis not present

## 2024-01-18 DIAGNOSIS — Z23 Encounter for immunization: Secondary | ICD-10-CM

## 2024-01-18 DIAGNOSIS — R140 Abdominal distension (gaseous): Secondary | ICD-10-CM | POA: Diagnosis not present

## 2024-01-18 DIAGNOSIS — Y830 Surgical operation with transplant of whole organ as the cause of abnormal reaction of the patient, or of later complication, without mention of misadventure at the time of the procedure: Secondary | ICD-10-CM | POA: Diagnosis present

## 2024-01-18 DIAGNOSIS — Z79899 Other long term (current) drug therapy: Secondary | ICD-10-CM

## 2024-01-18 DIAGNOSIS — Z86711 Personal history of pulmonary embolism: Secondary | ICD-10-CM

## 2024-01-18 DIAGNOSIS — N3000 Acute cystitis without hematuria: Secondary | ICD-10-CM | POA: Diagnosis present

## 2024-01-18 DIAGNOSIS — E1122 Type 2 diabetes mellitus with diabetic chronic kidney disease: Secondary | ICD-10-CM | POA: Diagnosis present

## 2024-01-18 DIAGNOSIS — D649 Anemia, unspecified: Secondary | ICD-10-CM | POA: Diagnosis not present

## 2024-01-18 DIAGNOSIS — I6782 Cerebral ischemia: Secondary | ICD-10-CM | POA: Diagnosis not present

## 2024-01-18 DIAGNOSIS — Z940 Kidney transplant status: Secondary | ICD-10-CM

## 2024-01-18 DIAGNOSIS — R7989 Other specified abnormal findings of blood chemistry: Secondary | ICD-10-CM | POA: Diagnosis present

## 2024-01-18 DIAGNOSIS — Z91048 Other nonmedicinal substance allergy status: Secondary | ICD-10-CM

## 2024-01-18 LAB — BASIC METABOLIC PANEL WITH GFR
Anion gap: 16 — ABNORMAL HIGH (ref 5–15)
BUN: 69 mg/dL — ABNORMAL HIGH (ref 8–23)
CO2: 18 mmol/L — ABNORMAL LOW (ref 22–32)
Calcium: 8.6 mg/dL — ABNORMAL LOW (ref 8.9–10.3)
Chloride: 103 mmol/L (ref 98–111)
Creatinine, Ser: 4.39 mg/dL — ABNORMAL HIGH (ref 0.61–1.24)
GFR, Estimated: 13 mL/min — ABNORMAL LOW (ref >60)
Glucose, Bld: 173 mg/dL — ABNORMAL HIGH (ref 70–99)
Potassium: 4.2 mmol/L (ref 3.5–5.1)
Sodium: 137 mmol/L (ref 135–145)

## 2024-01-18 LAB — GLUCOSE, CAPILLARY
Glucose-Capillary: 240 mg/dL — ABNORMAL HIGH (ref 70–99)
Glucose-Capillary: 149 mg/dL — ABNORMAL HIGH (ref 70–99)

## 2024-01-18 LAB — CBC WITH DIFFERENTIAL/PLATELET
Abs Immature Granulocytes: 0.05 K/uL (ref 0.00–0.07)
Basophils Absolute: 0 K/uL (ref 0.0–0.1)
Basophils Relative: 0 %
Eosinophils Absolute: 0.1 K/uL (ref 0.0–0.5)
Eosinophils Relative: 1 %
HCT: 32.7 % — ABNORMAL LOW (ref 39.0–52.0)
Hemoglobin: 10.2 g/dL — ABNORMAL LOW (ref 13.0–17.0)
Immature Granulocytes: 1 %
Lymphocytes Relative: 9 %
Lymphs Abs: 0.9 K/uL (ref 0.7–4.0)
MCH: 27.2 pg (ref 26.0–34.0)
MCHC: 31.2 g/dL (ref 30.0–36.0)
MCV: 87.2 fL (ref 80.0–100.0)
Monocytes Absolute: 1.7 K/uL — ABNORMAL HIGH (ref 0.1–1.0)
Monocytes Relative: 17 %
Neutro Abs: 7.2 K/uL (ref 1.7–7.7)
Neutrophils Relative %: 72 %
Platelets: 166 K/uL (ref 150–400)
RBC: 3.75 MIL/uL — ABNORMAL LOW (ref 4.22–5.81)
RDW: 13.2 % (ref 11.5–15.5)
WBC: 9.9 K/uL (ref 4.0–10.5)
nRBC: 0 % (ref 0.0–0.2)

## 2024-01-18 LAB — CBG MONITORING, ED: Glucose-Capillary: 160 mg/dL — ABNORMAL HIGH (ref 70–99)

## 2024-01-18 LAB — URINALYSIS, ROUTINE W REFLEX MICROSCOPIC
Bilirubin Urine: NEGATIVE
Glucose, UA: NEGATIVE mg/dL
Ketones, ur: NEGATIVE mg/dL
Nitrite: NEGATIVE
Protein, ur: 100 mg/dL — AB
Specific Gravity, Urine: 1.011 (ref 1.005–1.030)
WBC, UA: >50 WBC/hpf (ref 0–5)
pH: 5 (ref 5.0–8.0)

## 2024-01-18 LAB — TROPONIN I (HIGH SENSITIVITY)
Troponin I (High Sensitivity): 29 ng/L — ABNORMAL HIGH (ref <18)
Troponin I (High Sensitivity): 35 ng/L — ABNORMAL HIGH (ref <18)

## 2024-01-18 LAB — HEMOGLOBIN A1C
Hgb A1c MFr Bld: 8.3 % — ABNORMAL HIGH (ref 4.8–5.6)
Mean Plasma Glucose: 191.51 mg/dL

## 2024-01-18 MED ORDER — CARVEDILOL 6.25 MG PO TABS
6.2500 mg | ORAL_TABLET | Freq: Two times a day (BID) | ORAL | Status: DC
  Administered 2024-01-18: 6.25 mg via ORAL
  Filled 2024-01-18: qty 1

## 2024-01-18 MED ORDER — PREDNISONE 10 MG PO TABS
5.0000 mg | ORAL_TABLET | Freq: Every day | ORAL | Status: DC
  Administered 2024-01-18 – 2024-01-23 (×6): 5 mg via ORAL
  Filled 2024-01-18 (×6): qty 1

## 2024-01-18 MED ORDER — AMLODIPINE BESYLATE 5 MG PO TABS
2.5000 mg | ORAL_TABLET | Freq: Every day | ORAL | Status: DC
  Administered 2024-01-18 – 2024-01-23 (×6): 2.5 mg via ORAL
  Filled 2024-01-18 (×6): qty 1

## 2024-01-18 MED ORDER — CALCITRIOL 0.25 MCG PO CAPS
0.2500 ug | ORAL_CAPSULE | ORAL | Status: DC
  Administered 2024-01-19 – 2024-01-21 (×2): 0.25 ug via ORAL
  Filled 2024-01-18 (×2): qty 1

## 2024-01-18 MED ORDER — SEVELAMER CARBONATE 800 MG PO TABS: 800.0000 mg | ORAL_TABLET | Freq: Three times a day (TID) | ORAL | Status: DC

## 2024-01-18 MED ORDER — SODIUM CHLORIDE 0.9 % IV SOLN: INTRAVENOUS | Status: DC

## 2024-01-18 MED ORDER — ROSUVASTATIN CALCIUM 20 MG PO TABS
20.0000 mg | ORAL_TABLET | Freq: Every day | ORAL | Status: DC
  Administered 2024-01-18: 20 mg via ORAL
  Filled 2024-01-18: qty 1

## 2024-01-18 MED ORDER — APIXABAN 5 MG PO TABS
5.0000 mg | ORAL_TABLET | Freq: Two times a day (BID) | ORAL | Status: DC
  Administered 2024-01-18 – 2024-01-23 (×11): 5 mg via ORAL
  Filled 2024-01-18 (×11): qty 1

## 2024-01-18 MED ORDER — PREDNISONE 10 MG PO TABS: 10.0000 mg | ORAL_TABLET | Freq: Every day | ORAL | Status: DC

## 2024-01-18 MED ORDER — INSULIN ASPART 100 UNIT/ML IJ SOLN
0.0000 [IU] | Freq: Three times a day (TID) | INTRAMUSCULAR | Status: DC
  Administered 2024-01-18: 1 [IU] via SUBCUTANEOUS
  Administered 2024-01-18: 3 [IU] via SUBCUTANEOUS
  Administered 2024-01-19: 2 [IU] via SUBCUTANEOUS
  Administered 2024-01-19: 5 [IU] via SUBCUTANEOUS
  Administered 2024-01-19: 2 [IU] via SUBCUTANEOUS
  Administered 2024-01-20: 7 [IU] via SUBCUTANEOUS
  Administered 2024-01-20: 3 [IU] via SUBCUTANEOUS
  Administered 2024-01-21: 2 [IU] via SUBCUTANEOUS
  Administered 2024-01-21: 5 [IU] via SUBCUTANEOUS
  Administered 2024-01-21: 3 [IU] via SUBCUTANEOUS
  Administered 2024-01-22: 2 [IU] via SUBCUTANEOUS
  Administered 2024-01-22: 1 [IU] via SUBCUTANEOUS
  Administered 2024-01-23: 3 [IU] via SUBCUTANEOUS

## 2024-01-18 MED ORDER — FAMOTIDINE 20 MG PO TABS
20.0000 mg | ORAL_TABLET | Freq: Two times a day (BID) | ORAL | Status: DC
  Administered 2024-01-18 (×2): 20 mg via ORAL
  Filled 2024-01-18 (×2): qty 1

## 2024-01-18 MED ORDER — INFLUENZA VAC SPLIT HIGH-DOSE 0.5 ML IM SUSY
0.5000 mL | PREFILLED_SYRINGE | INTRAMUSCULAR | Status: AC
  Administered 2024-01-19: 0.5 mL via INTRAMUSCULAR
  Filled 2024-01-18 (×3): qty 0.5

## 2024-01-18 MED ORDER — TACROLIMUS 1 MG PO CAPS
3.0000 mg | ORAL_CAPSULE | Freq: Two times a day (BID) | ORAL | Status: DC
  Administered 2024-01-18 – 2024-01-23 (×10): 3 mg via ORAL
  Filled 2024-01-18 (×10): qty 3

## 2024-01-18 MED ORDER — FUROSEMIDE 40 MG PO TABS
40.0000 mg | ORAL_TABLET | Freq: Every day | ORAL | Status: DC
  Administered 2024-01-18: 40 mg via ORAL
  Filled 2024-01-18: qty 1

## 2024-01-18 MED ORDER — SODIUM CHLORIDE 0.9 % IV SOLN: Freq: Once | INTRAVENOUS | Status: AC

## 2024-01-18 MED ORDER — CARVEDILOL 12.5 MG PO TABS
12.5000 mg | ORAL_TABLET | Freq: Two times a day (BID) | ORAL | Status: DC
  Administered 2024-01-19 – 2024-01-23 (×9): 12.5 mg via ORAL
  Filled 2024-01-18 (×9): qty 1

## 2024-01-18 MED ORDER — HEPARIN SODIUM (PORCINE) 5000 UNIT/ML IJ SOLN
5000.0000 [IU] | Freq: Three times a day (TID) | INTRAMUSCULAR | Status: DC
  Administered 2024-01-18: 5000 [IU] via SUBCUTANEOUS
  Filled 2024-01-18: qty 1

## 2024-01-18 NOTE — ED Provider Notes (Signed)
 [place] [facility] AT [facility] Provider Note   CSN: [id number] Arrival date & time: 01/18/24  0531     Patient presents with: Altered Mental Status and Fall   [name] [name] is a 73 y.o. male.   73 year old male brought in by EMS from home.  Patient states that he woke up in the middle the night to go to the bathroom.  He states that the bathroom is located across from his bedroom.  He was trying to feel his way in the dark but got turned around and the next thing he remembers is waking up on the floor.  He does not recall tripping or feeling weak or dizzy.  He states that he woke up to his family talk about helping him get up and with the lights were turned on he realized that he had made it further down the hall than expected.  He is on Eliquis .  He denies any injuries to his head.  EMS reports blood pressure of 72/40 on arrival, provided with 600 mL of normal saline with blood pressure improvement to 129/64 on arrival. History of kidney transplant in 2001.  States that his kidney has started to feel although is still working and not currently doing dialysis.       Prior to Admission medications   Medication Sig Start Date End Date Taking? Authorizing Provider  Alcohol Swabs (PHARMACIST CHOICE ALCOHOL) PADS SMARTSIG:1 Each Topical 4 Times Daily 05/19/22   [provider]  amLODipine  (NORVASC ) 2.5 MG tablet Take 2.5 mg by mouth daily. 08/16/21   [provider]  apixaban  (ELIQUIS ) 5 MG TABS tablet Take 5 mg by mouth 2 (two) times daily.    [provider]  BAQSIMI ONE PACK 3 MG/DOSE POWD Place 1 spray into the nose daily as needed for low blood sugar. 05/13/21   [provider]  carvedilol  (COREG ) 6.25 MG tablet Take 6.25 mg by mouth 2 (two) times daily. 09/11/20   [provider]  COMFORT EZ PEN NEEDLES 32G X 4 MM MISC  05/19/22   [provider]  famotidine  (PEPCID ) 20 MG tablet Take 20 mg by mouth 2 (two)  times daily. 09/12/20   [provider]  folic acid  (FOLVITE ) 1 MG tablet Take 1 mg by mouth daily. 09/12/20   [provider]  furosemide  (LASIX ) 40 MG tablet Take 40 mg by mouth daily. 07/19/21   [provider]  insulin  aspart (NOVOLOG ) 100 UNIT/ML injection Inject 0-15 Units into the skin 4 (four) times daily - after meals and at bedtime. Patient taking differently: Inject 6 Units into the skin 3 (three) times daily. Per sliding scale 07/07/20   Milissa Tod PARAS, MD  insulin  glargine (LANTUS  SOLOSTAR) 100 UNIT/ML Solostar Pen Inject 5 Units into the skin at bedtime.    [provider]  Melatonin 10 MG TABS Take 10 mg by mouth at bedtime.    [provider]  predniSONE  (DELTASONE ) 5 MG tablet Take 10 mg by mouth daily with breakfast.    [provider]  rosuvastatin  (CRESTOR ) 20 MG tablet TAKE 1 TABLET BY MOUTH EVERY DAY 11/01/23   Theophilus Andrews, Tully GRADE, MD  sevelamer  carbonate (RENVELA ) 800 MG tablet  10/11/17   [provider]  thiamine  100 MG tablet Take 100 mg by mouth daily. 09/11/20   [provider]    Allergies: Other, Aquamed, Bag balm  [albolene], and Cold cream    Review of Systems Negative except  as per HPI Updated Vital Signs BP 129/64 (BP Location: Left Arm)   Pulse 79   Temp 98 F (36.7 C) (Oral)   Resp 14   Ht 5' 9.5 (1.765 m)   Wt 59 kg   SpO2 100%   BMI 18.92 kg/m   Physical Exam Vitals and nursing note reviewed.  Constitutional:      General: He is not in acute distress.    Appearance: He is well-developed. He is not diaphoretic.  HENT:     Head: Normocephalic and atraumatic.     Nose: Nose normal.     Mouth/Throat:     Mouth: Mucous membranes are moist.  Eyes:     Extraocular Movements: Extraocular movements intact.  Cardiovascular:     Rate and Rhythm: Normal rate and regular rhythm.     Pulses: Normal pulses.     Heart sounds: Normal heart sounds.  Pulmonary:     Effort:  Pulmonary effort is normal.     Breath sounds: Normal breath sounds.  Abdominal:     Palpations: Abdomen is soft.     Tenderness: There is no abdominal tenderness.  Musculoskeletal:     Cervical back: Neck supple.     Right lower leg: No edema.     Left lower leg: No edema.  Skin:    General: Skin is warm and dry.     Findings: No erythema or rash.  Neurological:     Mental Status: He is alert and oriented to person, place, and time.     Cranial Nerves: No cranial nerve deficit.     Motor: No weakness.  Psychiatric:        Behavior: Behavior normal.     (all labs ordered are listed, but only abnormal results are displayed) Labs Reviewed  BASIC METABOLIC PANEL WITH GFR - Abnormal; Notable for the following components:      Result Value   CO2 18 (*)    Glucose, Bld 173 (*)    BUN 69 (*)    Creatinine, Ser 4.39 (*)    Calcium  8.6 (*)    GFR, Estimated 13 (*)    Anion gap 16 (*)    All other components within normal limits  CBC WITH DIFFERENTIAL/PLATELET - Abnormal; Notable for the following components:   RBC 3.75 (*)    Hemoglobin 10.2 (*)    HCT 32.7 (*)    Monocytes Absolute 1.7 (*)    All other components within normal limits  CBG MONITORING, ED - Abnormal; Notable for the following components:   Glucose-Capillary 160 (*)    All other components within normal limits  URINALYSIS, ROUTINE W REFLEX MICROSCOPIC  TROPONIN I (HIGH SENSITIVITY)    EKG: EKG Interpretation Date/Time:  Tuesday January 18 2024 05:39:55 EDT Ventricular Rate:  75 PR Interval:  143 QRS Duration:  77 QT Interval:  387 QTC Calculation: 433 R Axis:   66  Text Interpretation: Sinus arrhythmia Consider left atrial enlargement Confirmed by Theadore Sharper [phone]) on 01/18/2024 6:43:29 AM  Radiology: [facility] Chest Port 1 View Result Date: 01/18/2024 CLINICAL DATA:  Syncope. EXAM: PORTABLE CHEST 1 VIEW COMPARISON:  01/13/2022 FINDINGS: The lungs are clear without focal pneumonia, edema, pneumothorax or  pleural effusion. The cardiopericardial silhouette is within normal limits for size. No acute bony abnormality. Telemetry leads overlie the chest. IMPRESSION: No active disease. Electronically Signed   By: Camellia Candle M.D.   On: 01/18/2024 06:17     Procedures   Medications Ordered in  the ED - No data to display                                  Medical Decision Making Amount and/or Complexity of Data Reviewed Labs: ordered. Radiology: ordered.   This patient presents to the ED for concern of possible syncope vs fall, this involves an extensive number of treatment options, and is a complaint that carries with it a high risk of complications and morbidity.  The differential diagnosis includes but not limited to metabolic/electrolyte, arrhythmia, intracranial hemorrhage, c-spine injury    Co morbidities / Chronic conditions that complicate the patient evaluation  CKD- renal transplant, BPH, HTN, DM, prostate cancer   Additional history obtained:  Additional history obtained from EMR External records from outside source obtained and reviewed including prior labs and imaging on file   Lab Tests:  I Ordered, and personally interpreted labs.  The pertinent results include: CBC with mild anemia, hemoglobin of 10.2.  BMP with worsening creatinine, currently up to 4.39, gap is 16 with bicarb of 18.  Glucose is 173.   Imaging Studies ordered:  I ordered imaging studies including CT head, CT C-spine, chest x-ray I independently visualized and interpreted imaging which showed chest x-ray without acute process, remaining imaging is pending at time of signout to oncoming provider I agree with the radiologist interpretation   Cardiac Monitoring: / EKG:  The patient was maintained on a cardiac monitor.  I personally viewed and interpreted the cardiac monitored which showed an underlying rhythm of: sinus arrhythmia, rate 75, unchanged compared to prior    Problem List / ED Course /  Critical interventions / Medication management  73 yo male brought in by EMS after syncopal event at home, found to be hypotensive with EMS, resolved with fluids on ER arrival. Exam unremarkable. No obvious trauma, is on thinners, CT head and CT ordered as well as labs and cardiac enzymes for syncope. Care signed out at change of shift.  I have reviewed the patients home medicines and have made adjustments as needed   Social Determinants of Health:  Lives at home with family   Test / Admission - Considered:  Disposition pending at time of signout tachycardia provider      Final diagnoses:  Syncope, unspecified syncope type  Hypotensive episode    ED Discharge Orders     None          Beverley Leita DELENA DEVONNA 01/18/24 [id number]    Theadore Ozell HERO, MD 01/18/24 [phone]

## 2024-01-18 NOTE — ED Provider Notes (Signed)
 Accepted handoff at shift change from LM PA-C. Please see prior provider note for more detail.   Briefly: Patient is 73 y.o.              Physical Exam  BP 137/65   Pulse 79   Temp 98 F (36.7 C) (Oral)   Resp 14   Ht 5' 9.5 (1.765 m)   Wt 59 kg   SpO2 100%   BMI 18.92 kg/m   Physical Exam  Procedures  Procedures  ED Course / MDM    Medical Decision Making Amount and/or Complexity of Data Reviewed Labs: ordered. Radiology: ordered.  Risk Prescription drug management. Decision regarding hospitalization.   I received patient care and handoff from overnight team.  Patient is a 73 year old male who had syncopal episode and perhaps some momentary confusion.  Does not sound that he was postictal however low suspicion for seizure, did have elevated troponin, episode of hypotension and given age and concern for cardiac syncope admitted to hospitalist service.  From a trauma standpoint patient is well-appearing, also does have an AKI notably this is a patient with a history of kidney transplant.       Neldon Hamp RAMAN, [place] 01/18/24 1024    Mannie Pac T, DO 01/21/24 SHERRIAN

## 2024-01-18 NOTE — ED Triage Notes (Signed)
 Pt to ED via [facility] from home. Per family pt has been having trouble urinating, pt has hx of kidney transplant. Pt went to urinate, was unable to do so, but was going back to room, does not know what happened but woke up on the floor. Pt has no complaints of pain. Pt says he may take a blood thinner.  Pt A&Ox4. Was slow to respond on EMS arrival, pt able to speak quicker and clearer on arrival.  Pt states he thought he was going to lay down, but then passed out.   18g LAC 72/40, pt received 600mL NS, 152/ Cbg 168

## 2024-01-18 NOTE — H&P (Addendum)
 History and Physical    Patient: [name] [name] FMW:[id number] DOB: Oct 03, 1950 DOA: 01/18/2024 DOS: the patient was seen and examined on 01/18/2024 PCP: Theophilus Andrews, Tully GRADE, MD  Patient coming from: Home  Chief Complaint:  Chief Complaint  Patient presents with   Altered Mental Status   Fall   HPI: [name] [name] is a 73 y.o. male with medical history significant of  renal transplant on immunosuppression therapy, PE on Eliquis , HTN, HLD, DM2 p/w AKI on CKD.  The patient experienced confusion upon getting out of bed to go to the bathroom. The patient reported feeling disoriented, possibly due to low lighting, and mistakenly entered another room. While attempting to find the bed in what the patient believed to be their own room, the patient fell after slipping off a bed and landing on plastic bags. The patient remembered the fall and did not lose consciousness. The patient's wife activated EMS, who noted low blood pressure upon arrival.  In the ED, pt hypotensive and had low BP per EMS as well. Labs notable for BUN/Cr 69/4.39 (baseline Cr 2.7-3), and troponins 29-->35. EDP requested admission for AKI on CKD.  Review of Systems: As mentioned in the history of present illness. All other systems reviewed and are negative. Past Medical History:  Diagnosis Date   Anemia    as a younger man   Arthritis    hands   Cataract    Chronic kidney disease    starting to bother me now (07/28/2012)   Diabetes mellitus     type II   Diarrhea, functional    ED (erectile dysfunction)    Hypertension    Neuropathy    Peripheral neuropathy    Past Surgical History:  Procedure Laterality Date   APPENDECTOMY  1950's   AV FISTULA PLACEMENT Right 05/14/2016   Procedure: ARTERIOVENOUS (AV) FISTULA CREATION- RIGHT ARM;  Surgeon: Gaile LELON New, MD;  Location: [facility] OR;  Service: Vascular;  Laterality: Right;   COLONOSCOPY     KIDNEY TRANSPLANT Right    PROSTATE SURGERY     UPPER  GASTROINTESTINAL ENDOSCOPY     Social History:  reports that he quit smoking about 37 years ago. His smoking use included cigarettes. He started smoking about 47 years ago. He has a 1.2 pack-year smoking history. He has never used smokeless tobacco. He reports that he does not currently use alcohol after a past usage of about 1.0 standard drink of alcohol per week. He reports that he does not use drugs.  Allergies  Allergen Reactions   Other Rash    Oxyquinoline-white Pet-lanolin   Aquamed Rash   Bag Balm  [Albolene] Rash   Cold Cream Rash    Family History  Problem Relation Age of Onset   Colon polyps Mother    Stroke Mother    Diabetes Father    Diabetes Cousin    Prostate cancer Brother    Other Daughter        kidney problems   Esophageal cancer Maternal Aunt    Colon cancer Neg Hx    Stomach cancer Neg Hx    Rectal cancer Neg Hx     Prior to Admission medications   Medication Sig Start Date End Date Taking? Authorizing Provider  Alcohol Swabs (PHARMACIST CHOICE ALCOHOL) PADS SMARTSIG:1 Each Topical 4 Times Daily 05/19/22   [provider]  amLODipine  (NORVASC ) 2.5 MG tablet Take 2.5 mg by mouth daily. 08/16/21   [provider]  apixaban  (ELIQUIS ) 5  MG TABS tablet Take 5 mg by mouth 2 (two) times daily.    [provider]  BAQSIMI ONE PACK 3 MG/DOSE POWD Place 1 spray into the nose daily as needed for low blood sugar. 05/13/21   [provider]  carvedilol  (COREG ) 6.25 MG tablet Take 6.25 mg by mouth 2 (two) times daily. 09/11/20   [provider]  COMFORT EZ PEN NEEDLES 32G X 4 MM MISC  05/19/22   [provider]  famotidine  (PEPCID ) 20 MG tablet Take 20 mg by mouth 2 (two) times daily. 09/12/20   [provider]  folic acid  (FOLVITE ) 1 MG tablet Take 1 mg by mouth daily. 09/12/20   [provider]  furosemide  (LASIX ) 40 MG tablet Take 40 mg by mouth daily. 07/19/21   [provider]  insulin  aspart  (NOVOLOG ) 100 UNIT/ML injection Inject 0-15 Units into the skin 4 (four) times daily - after meals and at bedtime. Patient taking differently: Inject 6 Units into the skin 3 (three) times daily. Per sliding scale 07/07/20   Milissa Tod PARAS, MD  insulin  glargine (LANTUS  SOLOSTAR) 100 UNIT/ML Solostar Pen Inject 5 Units into the skin at bedtime.    [provider]  Melatonin 10 MG TABS Take 10 mg by mouth at bedtime.    [provider]  predniSONE  (DELTASONE ) 5 MG tablet Take 10 mg by mouth daily with breakfast.    [provider]  rosuvastatin  (CRESTOR ) 20 MG tablet TAKE 1 TABLET BY MOUTH EVERY DAY 11/01/23   Theophilus Andrews, Tully GRADE, MD  sevelamer  carbonate (RENVELA ) 800 MG tablet  10/11/17   [provider]  thiamine  100 MG tablet Take 100 mg by mouth daily. 09/11/20   [provider]    Physical Exam: Vitals:   01/18/24 0539 01/18/24 0645 01/18/24 0730 01/18/24 0800  BP: 129/64 137/65 112/64 122/64  Pulse: 79  70 70  Resp: 14 14 17 15   Temp: 98 F (36.7 C)     TempSrc: Oral     SpO2: 100%  100% 100%  Weight:      Height:       General: Alert, oriented x3, resting comfortably in no acute distress Respiratory: Lungs clear to auscultation bilaterally with normal respiratory effort; no w/r/r Cardiovascular: Regular rate and rhythm w/o m/r/g   Data Reviewed:  Lab Results  Component Value Date   WBC 9.9 01/18/2024   HGB 10.2 (L) 01/18/2024   HCT 32.7 (L) 01/18/2024   MCV 87.2 01/18/2024   PLT 166 01/18/2024   Lab Results  Component Value Date   GLUCOSE 173 (H) 01/18/2024   CALCIUM  8.6 (L) 01/18/2024   NA 137 01/18/2024   K 4.2 01/18/2024   CO2 18 (L) 01/18/2024   CL 103 01/18/2024   BUN 69 (H) 01/18/2024   CREATININE 4.39 (H) 01/18/2024   Lab Results  Component Value Date   ALT 11 08/05/2022   AST 12 08/05/2022   ALKPHOS 83 08/05/2022   BILITOT 0.3 08/05/2022   Lab Results  Component Value Date   INR 1.2 01/16/2020    INR 1.1 06/14/2019   Radiology: CT Cervical Spine Wo Contrast Result Date: 01/18/2024 CLINICAL DATA:  Fall, syncope EXAM: CT CERVICAL SPINE WITHOUT CONTRAST TECHNIQUE: Multidetector CT imaging of the cervical spine was performed without intravenous contrast. Multiplanar CT image reconstructions were also generated. RADIATION DOSE REDUCTION: This exam was performed according to the departmental dose-optimization program which includes automated exposure control, adjustment of the mA  and/or kV according to patient size and/or use of iterative reconstruction technique. COMPARISON:  Prior CT scan of the cervical spine 01/07/2022 FINDINGS: Alignment: No focal malalignment. Loss of the normal cervical lordosis is again noted with focal anterolisthesis of C4 on C5 measuring approximately 2 mm and retrolisthesis of C6 on C7 measuring approximately 4 mm. This is unchanged compared to prior. Skull base and vertebrae: No acute fracture. No primary bone lesion or focal pathologic process. Soft tissues and spinal canal: No prevertebral fluid or swelling. No visible canal hematoma. Disc levels: Multilevel cervical spondylosis with degenerative disc changes at C5-C6, C6-C7 and C7-T1. Upper chest: Negative. Other: None. IMPRESSION: 1. No evidence of acute fracture or malalignment. 2. Similar appearance of multilevel cervical spondylosis most significant at C5-C6, C6-C7 and C7-T1. 3. As before, there is focal retrolisthesis of the C5-C6 segment resulting in anterolisthesis of C4 on C5 and retrolisthesis of C6 on C7. This also results in loss of the normal cervical lordosis. Findings are almost certainly chronic and degenerative in nature given stability over time. Electronically Signed   By: Wilkie Lent M.D.   On: 01/18/2024 07:23   CT Head Wo Contrast Result Date: 01/18/2024 CLINICAL DATA:  Status post fall.  Syncope. EXAM: CT HEAD WITHOUT CONTRAST CT CERVICAL SPINE WITHOUT CONTRAST TECHNIQUE: Multidetector CT imaging of  the head and cervical spine was performed following the standard protocol without intravenous contrast. Multiplanar CT image reconstructions of the cervical spine were also generated. RADIATION DOSE REDUCTION: This exam was performed according to the departmental dose-optimization program which includes automated exposure control, adjustment of the mA and/or kV according to patient size and/or use of iterative reconstruction technique. COMPARISON:  01/13/2022 FINDINGS: CT HEAD FINDINGS Brain: There is no evidence for acute hemorrhage, hydrocephalus, mass lesion, or abnormal extra-axial fluid collection. No definite CT evidence for acute infarction. Patchy low attenuation in the deep hemispheric and periventricular white matter is nonspecific, but likely reflects chronic microvascular ischemic demyelination. Vascular: No hyperdense vessel or unexpected calcification. Skull: No evidence for fracture. No worrisome lytic or sclerotic lesion. Sinuses/Orbits: The visualized paranasal sinuses and mastoid air cells are clear. Visualized portions of the globes and intraorbital fat are unremarkable. Other: None. IMPRESSION: 1. No acute intracranial abnormality. 2. Chronic small vessel ischemic disease. Electronically Signed   By: Camellia Candle M.D.   On: 01/18/2024 06:55   DG Chest Port 1 View Result Date: 01/18/2024 CLINICAL DATA:  Syncope. EXAM: PORTABLE CHEST 1 VIEW COMPARISON:  01/13/2022 FINDINGS: The lungs are clear without focal pneumonia, edema, pneumothorax or pleural effusion. The cardiopericardial silhouette is within normal limits for size. No acute bony abnormality. Telemetry leads overlie the chest. IMPRESSION: No active disease. Electronically Signed   By: Camellia Candle M.D.   On: 01/18/2024 06:17    Assessment and Plan: 64M h/o renal transplant on immunosuppression therapy, PE on Eliquis , HTN, HLD, DM2 p/w AKI on CKD.  AKI on CKD -MIVF: NS at 100cc/h for 24h -Strict I&Os and daily weights (standing  preferred) -F/u BMP daily -Renally dose medications for CrCl -Avoid lovenox , NSAIDs, morphine , Fleet's phosphate enema, regular insulin , contrast; no gadolinium for MRI to avoid nephrogenic systemic fibrosis -Consider renal US  and nephrology consult if worsening AKI  S/p renal transplant -PTA tacrolimus  3mg  BID and prednisone  5mg  daily  PE -PTA Eliquis  5mg  BID  HTN -PTA amlodipine  and Coreg   HLD -PTA Crestor   DM2 -SSI TID AC prn   Advance Care Planning:   Code Status: Full Code   Consults:  N/A  Family Communication: Wife  Severity of Illness: The appropriate patient status for this patient is INPATIENT. Inpatient status is judged to be reasonable and necessary in order to provide the required intensity of service to ensure the patient's safety. The patient's presenting symptoms, physical exam findings, and initial radiographic and laboratory data in the context of their chronic comorbidities is felt to place them at high risk for further clinical deterioration. Furthermore, it is not anticipated that the patient will be medically stable for discharge from the hospital within 2 midnights of admission.   * I certify that at the point of admission it is my clinical judgment that the patient will require inpatient hospital care spanning beyond 2 midnights from the point of admission due to high intensity of service, high risk for further deterioration and high frequency of surveillance required.*   ------- I spent 60 minutes reviewing previous notes, at the bedside counseling/discussing the treatment plan, and performing clinical documentation.  Author: Marsha Ada, MD 01/18/2024 8:34 AM  For on call review [email or web address].

## 2024-01-19 DIAGNOSIS — N179 Acute kidney failure, unspecified: Secondary | ICD-10-CM | POA: Diagnosis not present

## 2024-01-19 LAB — CBC
HCT: 29.9 % — ABNORMAL LOW (ref 39.0–52.0)
Hemoglobin: 9.6 g/dL — ABNORMAL LOW (ref 13.0–17.0)
MCH: 27.3 pg (ref 26.0–34.0)
MCHC: 32.1 g/dL (ref 30.0–36.0)
MCV: 84.9 fL (ref 80.0–100.0)
Platelets: 164 K/uL (ref 150–400)
RBC: 3.52 MIL/uL — ABNORMAL LOW (ref 4.22–5.81)
RDW: 13.2 % (ref 11.5–15.5)
WBC: 8.5 K/uL (ref 4.0–10.5)
nRBC: 0 % (ref 0.0–0.2)

## 2024-01-19 LAB — BASIC METABOLIC PANEL WITH GFR
Anion gap: 10 (ref 5–15)
BUN: 65 mg/dL — ABNORMAL HIGH (ref 8–23)
CO2: 19 mmol/L — ABNORMAL LOW (ref 22–32)
Calcium: 8.5 mg/dL — ABNORMAL LOW (ref 8.9–10.3)
Chloride: 106 mmol/L (ref 98–111)
Creatinine, Ser: 4.27 mg/dL — ABNORMAL HIGH (ref 0.61–1.24)
GFR, Estimated: 14 mL/min — ABNORMAL LOW (ref >60)
Glucose, Bld: 301 mg/dL — ABNORMAL HIGH (ref 70–99)
Potassium: 3.9 mmol/L (ref 3.5–5.1)
Sodium: 135 mmol/L (ref 135–145)

## 2024-01-19 LAB — GLUCOSE, CAPILLARY
Glucose-Capillary: 200 mg/dL — ABNORMAL HIGH (ref 70–99)
Glucose-Capillary: 245 mg/dL — ABNORMAL HIGH (ref 70–99)
Glucose-Capillary: 264 mg/dL — ABNORMAL HIGH (ref 70–99)
Glucose-Capillary: 208 mg/dL — ABNORMAL HIGH (ref 70–99)

## 2024-01-19 MED ORDER — SODIUM CHLORIDE 0.9 % IV SOLN: INTRAVENOUS | Status: DC

## 2024-01-19 MED ORDER — ATORVASTATIN CALCIUM 40 MG PO TABS
40.0000 mg | ORAL_TABLET | Freq: Every day | ORAL | Status: DC
  Administered 2024-01-19 – 2024-01-23 (×5): 40 mg via ORAL
  Filled 2024-01-19 (×5): qty 1

## 2024-01-19 MED ORDER — INSULIN GLARGINE 100 UNIT/ML ~~LOC~~ SOLN
5.0000 [IU] | Freq: Every day | SUBCUTANEOUS | Status: DC
  Administered 2024-01-19 – 2024-01-22 (×4): 5 [IU] via SUBCUTANEOUS
  Filled 2024-01-19 (×5): qty 0.05

## 2024-01-19 MED ORDER — FAMOTIDINE 20 MG PO TABS
20.0000 mg | ORAL_TABLET | Freq: Every day | ORAL | Status: DC
  Administered 2024-01-19 – 2024-01-23 (×5): 20 mg via ORAL
  Filled 2024-01-19 (×5): qty 1

## 2024-01-19 NOTE — Inpatient Diabetes Management (Signed)
 Inpatient Diabetes Program Recommendations  AACE/ADA: New Consensus Statement on Inpatient Glycemic Control (2015)  Target Ranges:  Prepandial:   less than 140 mg/dL      Peak postprandial:   less than 180 mg/dL (1-2 hours)      Critically ill patients:  140 - 180 mg/dL   Lab Results  Component Value Date   GLUCAP 200 (H) 01/19/2024   HGBA1C 8.3 (H) 01/18/2024    Review of Glycemic Control  Latest Reference Range & Units 01/18/24 05:37 01/18/24 12:41 01/18/24 16:12 01/19/24 08:39  Glucose-Capillary 70 - 99 mg/dL 839 (H) 759 (H) 850 (H) 200 (H)   Diabetes history: DM  Outpatient Diabetes medications:  Lantus  5 units daily Humalog 0-15 units tid with meals Ozempic 0.5 mg weekly Current orders for Inpatient glycemic control:  Novolog  0-9 units tid with meals   Inpatient Diabetes Program Recommendations:    If appropriate, consider adding Lantus  5 units daily.   Thanks,  Randall Bullocks, RN, BC-ADM Inpatient Diabetes Coordinator Pager [phone]  (8a-5p)

## 2024-01-19 NOTE — Plan of Care (Signed)

## 2024-01-19 NOTE — Progress Notes (Signed)
 PROGRESS NOTE    [name] [name]  FMW:[id number] DOB: Mar 16, 1951 DOA: 01/18/2024 PCP: Theophilus Andrews, Tully GRADE, MD     Brief Narrative:  [name] [name] is a 73 y.o. male with medical history significant of  renal transplant on immunosuppression therapy, PE on Eliquis , HTN, HLD, DM2 p/w AKI on CKD.   The patient experienced confusion upon getting out of bed to go to the bathroom. The patient reported feeling disoriented, possibly due to low lighting, and mistakenly entered another room. While attempting to find the bed in what the patient believed to be their own room, the patient fell after slipping off a bed and landing on plastic bags. The patient remembered the fall and did not lose consciousness. The patient's wife activated EMS, who noted low blood pressure upon arrival.   In the ED, pt hypotensive and had low BP per EMS as well. Labs notable for BUN/Cr 69/4.39 (baseline Cr 2.7-3), and troponins 29-->35. EDP requested admission for AKI on CKD.  New events last 24 hours / Subjective: Patient sitting in bed, eating breakfast.  He has no new complaints today.  Has been urinating a lot.  He specifically denies any dysuria.  Assessment & Plan:   Principal Problem:   AKI (acute kidney injury) ([name]) Active Problems:   Pulmonary embolism ([name])   CKD (chronic kidney disease), stage IV ([name])   Essential hypertension   Other hyperlipidemia   Elevated troponin   Immunosuppressed status ([name])   AMS (altered mental status)   Type 2 diabetes mellitus ([name])    AKI on CKD stage 4 - History of renal transplant - Baseline creatinine 2.7-3.1 - Improved on IV fluid.  He did receive dose of lasix  on 9/2. This has been discontinued   Acute metabolic encephalopathy - Transient, resolved upon presentation to ED.  Now at baseline  Status post renal transplant - Tacrolimus , prednisone   History of PE - Eliquis   Hypertension - Amlodipine , Coreg   Hyperlipidemia - Crestor  --> Lipitor  due to renal function rec by pharmacy   Diabetes mellitus - Hemoglobin A1c 8.3 - Sliding scale insulin   Asymptomatic pyuria - Remains off antibiotics  Demand ischemia - Troponin 29, 35  DVT prophylaxis: apixaban  (ELIQUIS ) tablet 5 mg  Code Status: Full code Family Communication: No family at bedside Disposition Plan: Home Status is: Inpatient Remains inpatient appropriate because: IV fluid today    Antimicrobials:  Anti-infectives (From admission, onward)    None        Objective: Vitals:   01/18/24 2117 01/19/24 0106 01/19/24 0452 01/19/24 0827  BP: 134/66 (!) 146/72 (!) 144/69 (!) 152/84  Pulse: 88 89 73 86  Resp:    18  Temp: 97.9 F (36.6 C) 98.5 F (36.9 C) 97.8 F (36.6 C) 98.9 F (37.2 C)  TempSrc: Oral Oral Oral   SpO2: 100% 100% 100% 99%  Weight:      Height:        Intake/Output Summary (Last 24 hours) at 01/19/2024 1055 Last data filed at 01/19/2024 0323 Gross per 24 hour  Intake 1445 ml  Output --  Net 1445 ml   Filed Weights   01/18/24 0537 01/18/24 1042  Weight: 59 kg 57.4 kg    Examination:  General exam: Appears calm and comfortable  Respiratory system: Clear to auscultation. Respiratory effort normal. No respiratory distress. No conversational dyspnea.  Cardiovascular system: S1 & S2 heard, RRR. No murmurs. No pedal edema. Gastrointestinal system: Abdomen is nondistended, soft and nontender. Normal bowel sounds  heard. Central nervous system: Alert and oriented. No focal neurological deficits. Speech clear.  Extremities: Symmetric in appearance  Skin: No rashes, lesions or ulcers on exposed skin  Psychiatry: Judgement and insight appear normal. Mood & affect appropriate.   Data Reviewed: I have personally reviewed following labs and imaging studies  CBC: Recent Labs  Lab 01/18/24 0542 01/19/24 0231  WBC 9.9 8.5  NEUTROABS 7.2  --   HGB 10.2* 9.6*  HCT 32.7* 29.9*  MCV 87.2 84.9  PLT 166 164   Basic Metabolic Panel: Recent  Labs  Lab 01/18/24 0542 01/19/24 0231  NA 137 135  K 4.2 3.9  CL 103 106  CO2 18* 19*  GLUCOSE 173* 301*  BUN 69* 65*  CREATININE 4.39* 4.27*  CALCIUM  8.6* 8.5*   GFR: Estimated Creatinine Clearance (by C-G formula based on SCr of 4.27 mg/dL (H)) Male: 89.6 mL/min (A) Male: 12.1 mL/min (A) Liver Function Tests: No results for input(s): AST, ALT, ALKPHOS, BILITOT, PROT, ALBUMIN in the last 168 hours. No results for input(s): LIPASE, AMYLASE in the last 168 hours. No results for input(s): AMMONIA in the last 168 hours. Coagulation Profile: No results for input(s): INR, PROTIME in the last 168 hours. Cardiac Enzymes: No results for input(s): CKTOTAL, CKMB, CKMBINDEX, TROPONINI in the last 168 hours. BNP (last 3 results) No results for input(s): PROBNP in the last 8760 hours. HbA1C: Recent Labs    01/18/24 1418  HGBA1C 8.3*   CBG: Recent Labs  Lab 01/18/24 0537 01/18/24 1241 01/18/24 1612 01/19/24 0839  GLUCAP 160* 240* 149* 200*   Lipid Profile: No results for input(s): CHOL, HDL, LDLCALC, TRIG, CHOLHDL, LDLDIRECT in the last 72 hours. Thyroid  Function Tests: No results for input(s): TSH, T4TOTAL, FREET4, T3FREE, THYROIDAB in the last 72 hours. Anemia Panel: No results for input(s): VITAMINB12, FOLATE, FERRITIN, TIBC, IRON, RETICCTPCT in the last 72 hours. Sepsis Labs: No results for input(s): PROCALCITON, LATICACIDVEN in the last 168 hours.  No results found for this or any previous visit (from the past 240 hours).    Radiology Studies: CT Cervical Spine Wo Contrast Result Date: 01/18/2024 CLINICAL DATA:  Fall, syncope EXAM: CT CERVICAL SPINE WITHOUT CONTRAST TECHNIQUE: Multidetector CT imaging of the cervical spine was performed without intravenous contrast. Multiplanar CT image reconstructions were also generated. RADIATION DOSE REDUCTION: This exam was performed according to the  departmental dose-optimization program which includes automated exposure control, adjustment of the mA and/or kV according to patient size and/or use of iterative reconstruction technique. COMPARISON:  Prior CT scan of the cervical spine 01/07/2022 FINDINGS: Alignment: No focal malalignment. Loss of the normal cervical lordosis is again noted with focal anterolisthesis of C4 on C5 measuring approximately 2 mm and retrolisthesis of C6 on C7 measuring approximately 4 mm. This is unchanged compared to prior. Skull base and vertebrae: No acute fracture. No primary bone lesion or focal pathologic process. Soft tissues and spinal canal: No prevertebral fluid or swelling. No visible canal hematoma. Disc levels: Multilevel cervical spondylosis with degenerative disc changes at C5-C6, C6-C7 and C7-T1. Upper chest: Negative. Other: None. IMPRESSION: 1. No evidence of acute fracture or malalignment. 2. Similar appearance of multilevel cervical spondylosis most significant at C5-C6, C6-C7 and C7-T1. 3. As before, there is focal retrolisthesis of the C5-C6 segment resulting in anterolisthesis of C4 on C5 and retrolisthesis of C6 on C7. This also results in loss of the normal cervical lordosis. Findings are almost certainly chronic and degenerative in nature given stability over time.  Electronically Signed   By: Wilkie Lent M.D.   On: 01/18/2024 07:23   CT Head Wo Contrast Result Date: 01/18/2024 CLINICAL DATA:  Status post fall.  Syncope. EXAM: CT HEAD WITHOUT CONTRAST CT CERVICAL SPINE WITHOUT CONTRAST TECHNIQUE: Multidetector CT imaging of the head and cervical spine was performed following the standard protocol without intravenous contrast. Multiplanar CT image reconstructions of the cervical spine were also generated. RADIATION DOSE REDUCTION: This exam was performed according to the departmental dose-optimization program which includes automated exposure control, adjustment of the mA and/or kV according to patient  size and/or use of iterative reconstruction technique. COMPARISON:  01/13/2022 FINDINGS: CT HEAD FINDINGS Brain: There is no evidence for acute hemorrhage, hydrocephalus, mass lesion, or abnormal extra-axial fluid collection. No definite CT evidence for acute infarction. Patchy low attenuation in the deep hemispheric and periventricular white matter is nonspecific, but likely reflects chronic microvascular ischemic demyelination. Vascular: No hyperdense vessel or unexpected calcification. Skull: No evidence for fracture. No worrisome lytic or sclerotic lesion. Sinuses/Orbits: The visualized paranasal sinuses and mastoid air cells are clear. Visualized portions of the globes and intraorbital fat are unremarkable. Other: None. IMPRESSION: 1. No acute intracranial abnormality. 2. Chronic small vessel ischemic disease. Electronically Signed   By: Camellia Candle M.D.   On: 01/18/2024 06:55   DG Chest Port 1 View Result Date: 01/18/2024 CLINICAL DATA:  Syncope. EXAM: PORTABLE CHEST 1 VIEW COMPARISON:  01/13/2022 FINDINGS: The lungs are clear without focal pneumonia, edema, pneumothorax or pleural effusion. The cardiopericardial silhouette is within normal limits for size. No acute bony abnormality. Telemetry leads overlie the chest. IMPRESSION: No active disease. Electronically Signed   By: Camellia Candle M.D.   On: 01/18/2024 06:17      Scheduled Meds:  amLODipine   2.5 mg Oral Daily   apixaban   5 mg Oral BID   atorvastatin   40 mg Oral Daily   calcitRIOL   0.25 mcg Oral Q M,W,F   carvedilol   12.5 mg Oral BID WC   famotidine   20 mg Oral Daily   Influenza vac split trivalent PF  0.5 mL Intramuscular Tomorrow-1000   insulin  aspart  0-9 Units Subcutaneous TID WC   predniSONE   5 mg Oral Q breakfast   tacrolimus   3 mg Oral BID   Continuous Infusions:  sodium chloride  100 mL/hr at 01/19/24 0323     LOS: 1 day   Time spent: 25 minutes   Delon Hoe, DO [facility] 01/19/2024, 10:55 AM    Available via Epic secure chat 7am-7pm After these hours, please refer to coverage provider listed on [email or web address]

## 2024-01-20 ENCOUNTER — Inpatient Hospital Stay (HOSPITAL_COMMUNITY)

## 2024-01-20 DIAGNOSIS — N179 Acute kidney failure, unspecified: Secondary | ICD-10-CM | POA: Diagnosis not present

## 2024-01-20 LAB — URINALYSIS, W/ REFLEX TO CULTURE (INFECTION SUSPECTED)
Bilirubin Urine: NEGATIVE
Glucose, UA: 50 mg/dL — AB
Ketones, ur: NEGATIVE mg/dL
Nitrite: NEGATIVE
Protein, ur: 100 mg/dL — AB
Specific Gravity, Urine: 1.01 (ref 1.005–1.030)
WBC, UA: >50 WBC/hpf (ref 0–5)
pH: 6 (ref 5.0–8.0)

## 2024-01-20 LAB — BASIC METABOLIC PANEL WITH GFR
Anion gap: 10 (ref 5–15)
BUN: 57 mg/dL — ABNORMAL HIGH (ref 8–23)
CO2: 20 mmol/L — ABNORMAL LOW (ref 22–32)
Calcium: 8.8 mg/dL — ABNORMAL LOW (ref 8.9–10.3)
Chloride: 109 mmol/L (ref 98–111)
Creatinine, Ser: 4.01 mg/dL — ABNORMAL HIGH (ref 0.61–1.24)
GFR, Estimated: 15 mL/min — ABNORMAL LOW (ref >60)
Glucose, Bld: 237 mg/dL — ABNORMAL HIGH (ref 70–99)
Potassium: 4.4 mmol/L (ref 3.5–5.1)
Sodium: 139 mmol/L (ref 135–145)

## 2024-01-20 LAB — GLUCOSE, CAPILLARY
Glucose-Capillary: 155 mg/dL — ABNORMAL HIGH (ref 70–99)
Glucose-Capillary: 210 mg/dL — ABNORMAL HIGH (ref 70–99)
Glucose-Capillary: 302 mg/dL — ABNORMAL HIGH (ref 70–99)
Glucose-Capillary: 183 mg/dL — ABNORMAL HIGH (ref 70–99)

## 2024-01-20 MED ORDER — SODIUM CHLORIDE 0.9 % IV SOLN
1.0000 g | INTRAVENOUS | Status: DC
  Administered 2024-01-20 – 2024-01-21 (×2): 1 g via INTRAVENOUS
  Filled 2024-01-20 (×2): qty 10

## 2024-01-20 MED ORDER — POLYETHYLENE GLYCOL 3350 17 G PO PACK
34.0000 g | PACK | Freq: Every day | ORAL | Status: DC
  Administered 2024-01-20 – 2024-01-23 (×4): 34 g via ORAL
  Filled 2024-01-20 (×4): qty 2

## 2024-01-20 NOTE — Progress Notes (Signed)
 Nephrology Consult   Requesting provider: Dr. Kandis Service requesting consult: [facility] Reason for consult: AKI, renal transplant  Assessment/Recommendations:   AKI on CKD4 DDKT 2021 -suspect AKI was secondary to hemodynamic insults in the context of relative hypotension. Baseline eGFR <20, last Cr 3.9 on 8/18 -Cr back towards baseline -checking renal txp u/s -repeat UA. Recommend abx given that he is immunosuppressed -no indication for renal replacement therapy at this time. Hoping to stay away from HD initiation during this admission -Avoid nephrotoxic medications including NSAIDs and iodinated intravenous contrast exposure unless the latter is absolutely indicated.  Preferred narcotic agents for pain control are hydromorphone , fentanyl , and methadone. Morphine  should not be used. Avoid Baclofen and avoid oral sodium phosphate and magnesium  citrate based laxatives / bowel preps. Continue strict Input and Output monitoring. Will monitor the patient closely with you and intervene or adjust therapy as indicated by changes in clinical status/labs  Immunosuppression Management: -home maintenance IS: tacrolimus  3mg  BID-continue; prednisolone 5mg  daily-continue. Not on MMF given history of disseminated crypto High Risk Medical Decision Making For Drug Therapy Requiring Intensive Monitoring For Toxicity I/S levels: FK trough ordered for 9/5. Goal trough 5-7 Will continue intensive monitoring for drug levels and toxicity via regularly scheduled laboratory assays given the narrow therapeutic index of the immunosuppressive drug therapy listed above  HTN -initially hypotensive, resolved. BP stable  Cystitis -recommend abx given that he is immunocompromised, repeat UA  Toxic metabolic encephalopathy -seems to have resolved now, per primary  Anemia due to CKD -Transfuse for Hgb<7 g/dL  Uncontrolled Diabetes Mellitus Type 2  -per primary  Recommendations conveyed to primary service.  Discussed  with daughter at the bedside.  Ephriam Stank [facility] 01/20/2024 11:34 AM _____________________________________________________________________________________  History of Present Illness: [name] [name] is a/an 73 y.o. male with a past medical history of CKD 4/DDKT 2021 (followed by Dr. Marlee outpatient), PE on Eliquis , chronic LLE edema given history of hypertension, DM2, hyperlipidemia who presents to [facility] with altered mental status/disorientation.  Had a fall after slipping off her bed.  Patient does remember the fall and did not lose consciousness.  9 1 was called and he was noted to be hypotensive upon their arrival.  Creatinine 4.39 on presentation.  Abnormal urine sediment.  Patient seen and examined bedside this morning.  He reports being back to his usual state of health.  Biggest complaint is constipation.  Daughter reports that she feels like he is going back to having some disorientation, this is pretty common when he does have a UTI as per daughter. She also reports that he has been on Ozempic and has had GI issues with I.e. constipation. She has noticed that he lost quite a bit of weight on this.  He otherwise denies any fevers, chills, chest pain, shortness of breath, headaches, new shakes/tremors, nausea/vomiting, pain over allograft site, dysuria.   Medications:  Current Facility-Administered Medications  Medication Dose Route Frequency Provider Last Rate Last Admin   0.9 %  sodium chloride  infusion   Intravenous Continuous Rojelio Nest, DO   Stopped at 01/19/24 2155   amLODipine  (NORVASC ) tablet 2.5 mg  2.5 mg Oral Daily Georgina Marsha, MD   2.5 mg at 01/20/24 0915   apixaban  (ELIQUIS ) tablet 5 mg  5 mg Oral BID Georgina Marsha, MD   5 mg at 01/20/24 0915   atorvastatin  (LIPITOR) tablet 40 mg  40 mg Oral Daily Rojelio Nest, DO   40 mg at 01/20/24 0915   calcitRIOL  (ROCALTROL ) capsule  0.25 mcg  0.25 mcg Oral Q M,W,F Georgina Basket, MD   0.25 mcg at 01/19/24 1149    carvedilol  (COREG ) tablet 12.5 mg  12.5 mg Oral BID WC Georgina Basket, MD   12.5 mg at 01/20/24 9085   famotidine  (PEPCID ) tablet 20 mg  20 mg Oral Daily Rojelio Nest, DO   20 mg at 01/20/24 0915   insulin  aspart (novoLOG ) injection 0-9 Units  0-9 Units Subcutaneous TID WC Georgina Basket, MD   5 Units at 01/19/24 1748   insulin  glargine (LANTUS ) injection 5 Units  5 Units Subcutaneous Q2200 Rojelio Nest, DO   5 Units at 01/19/24 2226   polyethylene glycol (MIRALAX  / GLYCOLAX ) packet 34 g  34 g Oral Daily Wouk, Devaughn Sayres, MD       predniSONE  (DELTASONE ) tablet 5 mg  5 mg Oral Q breakfast Georgina Basket, MD   5 mg at 01/20/24 0915   tacrolimus  (PROGRAF ) capsule 3 mg  3 mg Oral BID Georgina Basket, MD   3 mg at 01/20/24 0915     ALLERGIES Other, Aquamed, Bag balm  [albolene], and Cold cream  MEDICAL HISTORY Past Medical History:  Diagnosis Date   Anemia    as a younger man   Arthritis    hands   Cataract    Chronic kidney disease    starting to bother me now (07/28/2012)   Diabetes mellitus     type II   Diarrhea, functional    ED (erectile dysfunction)    Hypertension    Neuropathy    Peripheral neuropathy      SOCIAL HISTORY Social History   Socioeconomic History   Marital status: Married    Spouse name: Not on file   Number of children: 3   Years of education: Not on file   Highest education level: Associate degree: academic program  Occupational History    Employer: [employer]    Comment: [employer]  Tobacco Use   Smoking status: Former    Current packs/day: 0.00    Average packs/day: 0.1 packs/day for 10.0 years (1.2 ttl pk-yrs)    Types: Cigarettes    Start date: 04/27/1976    Quit date: 04/27/1986    Years since quitting: 37.7   Smokeless tobacco: Never   Tobacco comments:    07/28/2012 quit smoking cigarettes 20-30 yr ago  Vaping Use   Vaping status: Never Used  Substance and Sexual Activity   Alcohol use: Not Currently     Alcohol/week: 1.0 standard drink of alcohol    Types: 1 Cans of beer per week    Comment: occ   Drug use: No   Sexual activity: Not Currently    Partners: Male  Other Topics Concern   Not on file  Social History Narrative   Not on file   Social Drivers of Health   Financial Resource Strain: Low Risk  (11/23/2023)   Received from [facility]   Overall Financial Resource Strain (CARDIA)    Difficulty of Paying Living Expenses: Not hard at all  Food Insecurity: No Food Insecurity (01/18/2024)   Hunger Vital Sign    Worried About Running Out of Food in the Last Year: Never true    Ran Out of Food in the Last Year: Never true  Transportation Needs: No Transportation Needs (01/18/2024)   PRAPARE - Administrator, Civil Service (Medical): No    Lack of Transportation (Non-Medical): No  Physical Activity: Sufficiently Active (  08/05/2022)   Exercise Vital Sign    Days of Exercise per Week: 3 days    Minutes of Exercise per Session: 50 min  Stress: No Stress Concern Present (08/05/2022)   [employer] of Occupational Health - Occupational Stress Questionnaire    Feeling of Stress : Not at all  Social Connections: Socially Integrated (01/18/2024)   Social Connection and Isolation Panel    Frequency of Communication with Friends and Family: More than three times a week    Frequency of Social Gatherings with Friends and Family: More than three times a week    Attends Religious Services: More than 4 times per year    Active Member of [employer] or Organizations: No    Attends Engineer, structural: More than 4 times per year    Marital Status: Married  Catering manager Violence: Not At Risk (01/18/2024)   Humiliation, Afraid, Rape, and Kick questionnaire    Fear of Current or Ex-Partner: No    Emotionally Abused: No    Physically Abused: No    Sexually Abused: No     FAMILY HISTORY Family History  Problem Relation Age of Onset   Colon polyps Mother     Stroke Mother    Diabetes Father    Diabetes Cousin    Prostate cancer Brother    Other Daughter        kidney problems   Esophageal cancer Maternal Aunt    Colon cancer Neg Hx    Stomach cancer Neg Hx    Rectal cancer Neg Hx      Review of Systems: 12 systems reviewed Otherwise as per HPI, all other systems reviewed and negative  Physical Exam: Vitals:   01/20/24 0454 01/20/24 0826  BP: 129/78 138/71  Pulse: 79 82  Resp: 18   Temp: 98.3 F (36.8 C) 98.4 F (36.9 C)  SpO2: 100% 100%   No intake/output data recorded.  Intake/Output Summary (Last 24 hours) at 01/20/2024 1134 Last data filed at 01/20/2024 0325 Gross per 24 hour  Intake 817.54 ml  Output 400 ml  Net 417.54 ml   General: well-appearing, no acute distress, sitting up eating breakfast HEENT: anicteric sclera, oropharynx clear without lesions CV: regular rate, normal rhythm, no murmurs, no gallops, no rubs, no peripheral edema Lungs: clear to auscultation bilaterally, normal work of breathing Abd: soft, non-tender, non-distended Skin: no visible lesions or rashes Psych: alert, engaged, appropriate mood and affect Musculoskeletal: no obvious deformities Neuro: normal speech, no gross focal deficits , no tremors  Test Results Reviewed Lab Results  Component Value Date   NA 139 01/20/2024   K 4.4 01/20/2024   CL 109 01/20/2024   CO2 20 (L) 01/20/2024   BUN 57 (H) 01/20/2024   CREATININE 4.01 (H) 01/20/2024   GFR 22.66 (L) 08/05/2022   GLU 112 09/16/2016   CALCIUM  8.8 (L) 01/20/2024   ALBUMIN 4.2 08/05/2022   PHOS 3.6 09/09/2021     I have reviewed all relevant outside healthcare records related to the patient's kidney injury.

## 2024-01-20 NOTE — Plan of Care (Signed)
  Problem: Education: Goal: Knowledge of General Education information will improve Description: Including pain rating scale, medication(s)/side effects and non-pharmacologic comfort measures Outcome: Progressing   Problem: Clinical Measurements: Goal: Will remain free from infection Outcome: Progressing   Problem: Nutrition: Goal: Adequate nutrition will be maintained Outcome: Progressing   Problem: Pain Managment: Goal: General experience of comfort will improve and/or be controlled Outcome: Progressing   Problem: Safety: Goal: Ability to remain free from injury will improve Outcome: Progressing   Problem: Skin Integrity: Goal: Risk for impaired skin integrity will decrease Outcome: Progressing   Problem: Coping: Goal: Ability to adjust to condition or change in health will improve Outcome: Progressing   Problem: Health Behavior/Discharge Planning: Goal: Ability to identify and utilize available resources and services will improve Outcome: Progressing Goal: Ability to manage health-related needs will improve Outcome: Progressing   Problem: Metabolic: Goal: Ability to maintain appropriate glucose levels will improve Outcome: Progressing   Problem: Nutritional: Goal: Maintenance of adequate nutrition will improve Outcome: Progressing Goal: Progress toward achieving an optimal weight will improve Outcome: Progressing

## 2024-01-20 NOTE — Progress Notes (Addendum)
 PROGRESS NOTE    [name] [name]  FMW:[id number] DOB: 09-19-1950 DOA: 01/18/2024 PCP: Theophilus Andrews, Tully GRADE, MD     Brief Narrative:  [name] [name] is a 73 y.o. male with medical history significant of  renal transplant on immunosuppression therapy, PE on Eliquis , HTN, HLD, DM2 p/w AKI on CKD.   The patient experienced confusion upon getting out of bed to go to the bathroom. The patient reported feeling disoriented, possibly due to low lighting, and mistakenly entered another room. While attempting to find the bed in what the patient believed to be their own room, the patient fell after slipping off a bed and landing on plastic bags. The patient remembered the fall and did not lose consciousness. The patient's wife activated EMS, who noted low blood pressure upon arrival.   In the ED, pt hypotensive and had low BP per EMS as well. Labs notable for BUN/Cr 69/4.39 (baseline Cr 2.7-3), and troponins 29-->35. EDP requested admission for AKI on CKD.  New events last 24 hours / Subjective:  Resting in bed. Reports normal appetite but no bm for 2 days. Daughter reports seems to be back to baseline.   Assessment & Plan:   Principal Problem:   AKI (acute kidney injury) ([name]) Active Problems:   Pulmonary embolism ([name])   Benign prostatic hyperplasia without lower urinary tract symptoms   CKD (chronic kidney disease), stage IV ([name])   Essential hypertension   Other hyperlipidemia   Elevated troponin   Immunosuppressed status ([name])   Kidney transplanted   AMS (altered mental status)   Type 2 diabetes mellitus ([name])    AKI on CKD stage 4 - History of renal transplant - Baseline creatinine 2.7-3.1, most recent however was 3.5. here 4s, slow improvement - continue fluids - renal u/s today - start monitoring I/Os, patient reports normal UOP - nephrology consult today  Acute cystitis? No clear uti symptoms but daughter reports prior episodes of aki with uti. Urinalysis from 9/2  does appear infected. Nephrology favors treating - repeat ua with reflext to culture - start ceftriaxone  once ua obtained  Acute metabolic encephalopathy - Transient, resolved upon presentation to ED.  Now at baseline which includes some confusion. CT head neagive  Status post renal transplant - Tacrolimus , prednisone  - nephro consult as above  Constipation - start miralax   Ambulatory dysfunction - PT consult  History of PE - Eliquis   Hypertension Bp controlled - Amlodipine , Coreg   Hyperlipidemia - Crestor  --> Lipitor due to renal function rec by pharmacy   Diabetes mellitus - Hemoglobin A1c 8.3 - euglycemic here - Sliding scale insulin   Demand ischemia - Troponin 29, 35. asymptomatic  DVT prophylaxis: apixaban  (ELIQUIS ) tablet 5 mg  Code Status: Full code Family Communication: daughter at bedside 9/4 Disposition Plan: tbd pending PT consult Status is: Inpatient Remains inpatient appropriate because: IV fluid, nephrology consult    Antimicrobials:  Anti-infectives (From admission, onward)    None        Objective: Vitals:   01/19/24 1956 01/19/24 2324 01/20/24 0454 01/20/24 0826  BP: (!) 144/75 139/76 129/78 138/71  Pulse: 85 86 79 82  Resp: 18 18 18    Temp: 98.6 F (37 C) 97.8 F (36.6 C) 98.3 F (36.8 C) 98.4 F (36.9 C)  TempSrc:      SpO2: 100% 100% 100% 100%  Weight:      Height:        Intake/Output Summary (Last 24 hours) at 01/20/2024 0946 Last data filed at 01/20/2024  9674 Gross per 24 hour  Intake 1627.54 ml  Output 400 ml  Net 1227.54 ml   Filed Weights   01/18/24 0537 01/18/24 1042  Weight: 59 kg 57.4 kg    Examination:  General exam: Appears calm and comfortable  Respiratory system: Clear to auscultation. Respiratory effort normal. No respiratory distress.   Cardiovascular system: S1 & S2 heard, RRR. No murmurs. No pedal edema. Gastrointestinal system: Abdomen is nondistended, soft and nontender.   Central nervous system:  Alert and oriented to self, place. No focal neurological deficits. Speech clear.  Extremities: Symmetric in appearance  Skin: No rashes, lesions or ulcers on exposed skin  Psychiatry: Judgement and insight appear normal. Mood & affect appropriate.   Data Reviewed: I have personally reviewed following labs and imaging studies  CBC: Recent Labs  Lab 01/18/24 0542 01/19/24 0231  WBC 9.9 8.5  NEUTROABS 7.2  --   HGB 10.2* 9.6*  HCT 32.7* 29.9*  MCV 87.2 84.9  PLT 166 164   Basic Metabolic Panel: Recent Labs  Lab 01/18/24 0542 01/19/24 0231 01/20/24 0215  NA 137 135 139  K 4.2 3.9 4.4  CL 103 106 109  CO2 18* 19* 20*  GLUCOSE 173* 301* 237*  BUN 69* 65* 57*  CREATININE 4.39* 4.27* 4.01*  CALCIUM  8.6* 8.5* 8.8*   GFR: Estimated Creatinine Clearance (by C-G formula based on SCr of 4.01 mg/dL (H)) Male: 11 mL/min (A) Male: 12.9 mL/min (A) Liver Function Tests: No results for input(s): AST, ALT, ALKPHOS, BILITOT, PROT, ALBUMIN in the last 168 hours. No results for input(s): LIPASE, AMYLASE in the last 168 hours. No results for input(s): AMMONIA in the last 168 hours. Coagulation Profile: No results for input(s): INR, PROTIME in the last 168 hours. Cardiac Enzymes: No results for input(s): CKTOTAL, CKMB, CKMBINDEX, TROPONINI in the last 168 hours. BNP (last 3 results) No results for input(s): PROBNP in the last 8760 hours. HbA1C: Recent Labs    01/18/24 1418  HGBA1C 8.3*   CBG: Recent Labs  Lab 01/19/24 0839 01/19/24 1227 01/19/24 1644 01/19/24 1956 01/20/24 0827  GLUCAP 200* 245* 264* 208* 155*   Lipid Profile: No results for input(s): CHOL, HDL, LDLCALC, TRIG, CHOLHDL, LDLDIRECT in the last 72 hours. Thyroid  Function Tests: No results for input(s): TSH, T4TOTAL, FREET4, T3FREE, THYROIDAB in the last 72 hours. Anemia Panel: No results for input(s): VITAMINB12, FOLATE, FERRITIN, TIBC, IRON,  RETICCTPCT in the last 72 hours. Sepsis Labs: No results for input(s): PROCALCITON, LATICACIDVEN in the last 168 hours.  No results found for this or any previous visit (from the past 240 hours).    Radiology Studies: No results found.     Scheduled Meds:  amLODipine   2.5 mg Oral Daily   apixaban   5 mg Oral BID   atorvastatin   40 mg Oral Daily   calcitRIOL   0.25 mcg Oral Q M,W,F   carvedilol   12.5 mg Oral BID WC   famotidine   20 mg Oral Daily   insulin  aspart  0-9 Units Subcutaneous TID WC   insulin  glargine  5 Units Subcutaneous Q2200   predniSONE   5 mg Oral Q breakfast   tacrolimus   3 mg Oral BID   Continuous Infusions:  sodium chloride  Stopped (01/19/24 2155)     LOS: 2 days    Devaughn KATHEE Ban, MD [facility] 01/20/2024, 9:46 AM   Available via Epic secure chat 7am-7pm After these hours, please refer to coverage provider listed on [email or web address]

## 2024-01-20 NOTE — Progress Notes (Signed)
 Urine sample sent to lab

## 2024-01-20 NOTE — Evaluation (Signed)
 Physical Therapy Evaluation Patient Details Name: [name] [name] MRN: [id number] DOB: 1951-02-06 Today's Date: 01/20/2024  History of Present Illness  Pt is 73 yo presenting [facility] on [facility] due to confusion and fall. PMH: renal transplant on immunosuppression therapy, PE on eliquis , HTN, HLD, DM II p/w AKI on CKD.  Clinical Impression  Pt is currently presenting as Min A for bed mobility, Sit to stand and CGA to Min A for transfers/gait/stairs. Pt currently was HHA without an AD and would benefit from AD use at home. Pt has RW, rollator and SPC available at home. Due to pt current functional status, home set up and available assistance at home recommending skilled physical therapy services 3x/week in order to address strength, balance and functional mobility to decrease risk for falls, injury and re-hospitalization.           If plan is discharge home, recommend the following: Help with stairs or ramp for entrance;Assistance with cooking/housework;Assist for transportation     Equipment Recommendations None recommended by PT     Functional Status Assessment Patient has had a recent decline in their functional status and demonstrates the ability to make significant improvements in function in a reasonable and predictable amount of time.     Precautions / Restrictions Precautions Precautions: Fall Recall of Precautions/Restrictions: Impaired Restrictions Weight Bearing Restrictions Per Provider Order: No      Mobility  Bed Mobility Overal bed mobility: Needs Assistance Bed Mobility: Supine to Sit, Sit to Supine     Supine to sit: Min assist Sit to supine: Modified independent (Device/Increase time)   General bed mobility comments: Pt grabbing therapists hand to pull up to mid line; required Min A    Transfers Overall transfer level: Needs assistance Equipment used: 1 person hand held assist Transfers: Sit to/from Stand, Bed to chair/wheelchair/BSC Sit to Stand: Min assist    Step pivot transfers: Contact guard assist       General transfer comment: Min A for initial stability in standing and instability at bil knees. CGA for stepping from EOB <> toilet    Ambulation/Gait Ambulation/Gait assistance: Contact guard assist, Min assist Gait Distance (Feet): 400 Feet Assistive device: 1 person hand held assist Gait Pattern/deviations: Step-through pattern, Decreased stance time - left, Decreased step length - right, Wide base of support Gait velocity: decreased Gait velocity interpretation: <1.8 ft/sec, indicate of risk for recurrent falls   General Gait Details: Pt with decreased stance time on the L and what appears to be an antalgic gait pattern; pt asked if he has pain and denies. Slight increase in BOS HHA for stabillity with intermittent Min A for balance.  Stairs Stairs: Yes Stairs assistance: Min assist, Contact guard assist Stair Management: Two rails, Step to pattern, Forwards Number of Stairs: 3 General stair comments: stairs per home set up, CGA for ascending and Min A for descending with assist controling momentum for descending the stairs.    Balance Overall balance assessment: Needs assistance Sitting-balance support: Single extremity supported, Feet supported Sitting balance-Leahy Scale: Good     Standing balance support: Single extremity supported, During functional activity Standing balance-Leahy Scale: Poor Standing balance comment: reliant on external support; will benefit from AD. Pt has SPC, RW and rollator at home which he was not using.         Pertinent Vitals/Pain Pain Assessment Pain Assessment: No/denies pain    Home Living Family/patient expects to be discharged to:: Private residence Living Arrangements: Spouse/significant other;Children (youngest daughter lives with pt  and spouse) Available Help at Discharge: Available 24 hours/day;Family Type of Home: House Home Access: Stairs to enter Entrance Stairs-Rails:  Right;Left Entrance Stairs-Number of Steps: 2   Home Layout: One level Home Equipment: Agricultural consultant (2 wheels);Rollator (4 wheels);Cane - single point;Tub bench      Prior Function Prior Level of Function : Independent/Modified Independent             Mobility Comments: Pt states that he uses the [facility] out in the community and stats he hasn't had to use the cane much. ADLs Comments: Pt reports ind with ADLs, youngest daughter assists with finances and medication     Extremity/Trunk Assessment   Upper Extremity Assessment Upper Extremity Assessment: Defer to OT evaluation    Lower Extremity Assessment Lower Extremity Assessment: Generalized weakness    Cervical / Trunk Assessment Cervical / Trunk Assessment: Normal  Communication   Communication Communication: No apparent difficulties    Cognition Arousal: Alert Behavior During Therapy: WFL for tasks assessed/performed   PT - Cognitive impairments: No apparent impairments, No family/caregiver present to determine baseline     PT - Cognition Comments: seems to have some memory deficits alert and oriented x 3 no significant deficits noted. Following commands: Intact       Cueing Cueing Techniques: Verbal cues     General Comments General comments (skin integrity, edema, etc.): No noted skin issues. Battery on cardiac monitor died during session. Battery replaced and HR 75 bpm after gait/stairs        Assessment/Plan    PT Assessment Patient needs continued PT services  PT Problem List Decreased strength;Decreased activity tolerance;Decreased balance;Decreased mobility;Decreased safety awareness       PT Treatment Interventions DME instruction;Balance training;Gait training;Stair training;Functional mobility training;Therapeutic activities;Therapeutic exercise;Patient/family education    PT Goals (Current goals can be found in the Care Plan section)  Acute Rehab PT Goals Patient Stated Goal: to improve  balance and go home PT Goal Formulation: With patient Time For Goal Achievement: 02/03/24 Potential to Achieve Goals: Good    Frequency Min 2X/week        AM-PAC PT 6 Clicks Mobility  Outcome Measure Help needed turning from your back to your side while in a flat bed without using bedrails?: A Little Help needed moving from lying on your back to sitting on the side of a flat bed without using bedrails?: A Little Help needed moving to and from a bed to a chair (including a wheelchair)?: A Little Help needed standing up from a chair using your arms (e.g., wheelchair or bedside chair)?: A Little Help needed to walk in hospital room?: A Little Help needed climbing 3-5 steps with a railing? : A Little 6 Click Score: 18    End of Session Equipment Utilized During Treatment: Gait belt Activity Tolerance: Patient tolerated treatment well Patient left: in bed;with call bell/phone within reach;with bed alarm set Nurse Communication: Mobility status PT Visit Diagnosis: Unsteadiness on feet (R26.81);Other abnormalities of gait and mobility (R26.89);History of falling (Z91.81);Muscle weakness (generalized) (M62.81)    Time: 8442-8369 PT Time Calculation (min) (ACUTE ONLY): 33 min   Charges:   PT Evaluation $PT Eval Low Complexity: 1 Low PT Treatments $Therapeutic Activity: 8-22 mins PT General Charges $$ ACUTE PT VISIT: 1 Visit        [name] [name], DPT, CLT  Acute Rehabilitation Services Office: [phone] (Secure chat preferred)   [name] [name] 01/20/2024, 5:09 PM

## 2024-01-21 DIAGNOSIS — N179 Acute kidney failure, unspecified: Secondary | ICD-10-CM | POA: Diagnosis not present

## 2024-01-21 LAB — CBC
HCT: 29.3 % — ABNORMAL LOW (ref 39.0–52.0)
Hemoglobin: 9.5 g/dL — ABNORMAL LOW (ref 13.0–17.0)
MCH: 27.1 pg (ref 26.0–34.0)
MCHC: 32.4 g/dL (ref 30.0–36.0)
MCV: 83.7 fL (ref 80.0–100.0)
Platelets: 210 K/uL (ref 150–400)
RBC: 3.5 MIL/uL — ABNORMAL LOW (ref 4.22–5.81)
RDW: 13.2 % (ref 11.5–15.5)
WBC: 8.1 K/uL (ref 4.0–10.5)
nRBC: 0 % (ref 0.0–0.2)

## 2024-01-21 LAB — COMPREHENSIVE METABOLIC PANEL WITH GFR
ALT: 7 U/L (ref 0–44)
AST: 10 U/L — ABNORMAL LOW (ref 15–41)
Albumin: 2.6 g/dL — ABNORMAL LOW (ref 3.5–5.0)
Alkaline Phosphatase: 45 U/L (ref 38–126)
Anion gap: 8 (ref 5–15)
BUN: 53 mg/dL — ABNORMAL HIGH (ref 8–23)
CO2: 20 mmol/L — ABNORMAL LOW (ref 22–32)
Calcium: 9.1 mg/dL (ref 8.9–10.3)
Chloride: 109 mmol/L (ref 98–111)
Creatinine, Ser: 3.79 mg/dL — ABNORMAL HIGH (ref 0.61–1.24)
GFR, Estimated: 16 mL/min — ABNORMAL LOW (ref >60)
Glucose, Bld: 134 mg/dL — ABNORMAL HIGH (ref 70–99)
Potassium: 4.1 mmol/L (ref 3.5–5.1)
Sodium: 137 mmol/L (ref 135–145)
Total Bilirubin: 0.2 mg/dL (ref 0.0–1.2)
Total Protein: 6.2 g/dL — ABNORMAL LOW (ref 6.5–8.1)

## 2024-01-21 LAB — GLUCOSE, CAPILLARY
Glucose-Capillary: 177 mg/dL — ABNORMAL HIGH (ref 70–99)
Glucose-Capillary: 159 mg/dL — ABNORMAL HIGH (ref 70–99)
Glucose-Capillary: 229 mg/dL — ABNORMAL HIGH (ref 70–99)
Glucose-Capillary: 253 mg/dL — ABNORMAL HIGH (ref 70–99)
Glucose-Capillary: 201 mg/dL — ABNORMAL HIGH (ref 70–99)

## 2024-01-21 NOTE — Plan of Care (Signed)

## 2024-01-21 NOTE — Progress Notes (Signed)
 Transition of Care [facility]) - Inpatient Brief Assessment   Patient Details  Name: [name] [name] MRN: [id number] Date of Birth: [birth year]  [facility]) CM/SW Contact:    Rosaline JONELLE Joe, RN Phone Number: 01/21/2024, 10:13 AM   Clinical Narrative: CM met with the patient at the bedside to discuss IP Care management needs to return home with family when stable.  Patient admitted to the hospital with confusion and fall.  Patient states that he lives with his spouse and daughter, [name] at the home.  DME at the home includes RW, Rolator, Single point cane.  Patient was offered Medicare choice regarding home health and patient did not have a preference.  I called [facility] and Arna Sayres, CM accepted for services.  HH order for [facility] PT placed to be co-signed by MD.  Patient plans to return home with family when stable.   Transition of Care Asessment: Insurance and Status: (P) Insurance coverage has been reviewed Patient has primary care physician: (P) Yes Home environment has been reviewed: (P) from home with spouse, daughter, [name] Prior level of function:: (P) RW, self Prior/Current Home Services: (P) Current home services (DMe at the home includes RW, Rolator, Single point cane) Social Drivers of Health Review: (P) SDOH reviewed interventions complete Readmission risk has been reviewed: (P) Yes Transition of care needs: (P) transition of care needs identified, TOC will continue to follow

## 2024-01-21 NOTE — Progress Notes (Signed)
 PROGRESS NOTE  [name] [name]  FMW:[id number] DOB: 10/12/1950 DOA: 01/18/2024 PCP: Theophilus Andrews, Tully GRADE, MD  Consultants  Brief Narrative: [name] [name] is a 73 y.o. male with medical history significant of  renal transplant on immunosuppression therapy, PE on Eliquis , HTN, HLD, DM2 p/w AKI on CKD p/w confusion at home.  The patient reported feeling disoriented, possibly due to low lighting, and mistakenly entered another room. While attempting to find the bed in what the patient believed to be their own room, the patient fell after slipping off a bed and landing on plastic bags. The patient remembered the fall and did not lose consciousness. The patient's wife activated EMS, who noted low blood pressure upon arrival.    In the ED, pt hypotensive and had low BP per EMS as well. Labs notable for BUN/Cr 69/4.39 (baseline Cr 2.7-3), and troponins 29-->35. EDP requested admission for AKI on CKD.   New events last 24 hours / Subjective:  Resting in bed. Reports normal appetite but no bm for 3 days.    Assessment & Plan:  Principal Problem:   AKI (acute kidney injury) ([name]) Active Problems:   Pulmonary embolism ([name])   Benign prostatic hyperplasia without lower urinary tract symptoms   CKD (chronic kidney disease), stage IV ([name])   Essential hypertension   Other hyperlipidemia   Elevated troponin   Immunosuppressed status ([name])   Kidney transplanted   AMS (altered mental status)   Type 2 diabetes mellitus ([name])   AKI on CKD stage 4 - History of renal transplant - Baseline creatinine 2.7-3.1, most recent however was 3.5. here 4s, slow improvement - continue fluids - renal u/s today - start monitoring I/Os, patient reports normal UOP - appreciate nephrology input --> Cr trending back towards baseline.      Acute cystitis: No clear uti symptoms but daughter reports prior episodes of aki with uti. Urinalysis from 9/2 does appear infected. Nephrology favors treating - awaiting  urine culture - on CTX currently.    Acute metabolic encephalopathy - Transient, resolved upon presentation to ED.  Now at baseline which includes some mild confusion. CT head neagive   Status post renal transplant - Tacrolimus , prednisone  - appreciate renal input.     Constipation - on miralax , no BM as of yet, follow.   - can add Senna if still no BM by tomorrow.    Ambulatory dysfunction - PT consult   History of PE - Eliquis    Hypertension Bp controlled - Amlodipine , Coreg    Hyperlipidemia - Crestor  --> Lipitor due to renal function rec by pharmacy    Diabetes mellitus - Hemoglobin A1c 8.3 - euglycemic here - Sliding scale insulin    Demand ischemia - Troponin 29, 35. asymptomatic      DVT prophylaxis:   apixaban  (ELIQUIS ) tablet 5 mg  Code Status:   Code Status: Full Code Level of care: Telemetry Medical Status is: Inpatient   Consults called: nephrology   Subjective: Patient awake, alert, eating breakfast.  No concerns or complaints.   Objective: Vitals:   01/21/24 0015 01/21/24 0421 01/21/24 0650 01/21/24 0749  BP: (!) 153/80 139/81 139/81 (!) 142/73  Pulse: 82 84 84 84  Resp: 16 16  16   Temp: 98.4 F (36.9 C) 98.6 F (37 C)  98.5 F (36.9 C)  TempSrc: Oral Oral  Oral  SpO2: 100% 100%  100%  Weight:      Height:        Intake/Output Summary (Last 24 hours) at  01/21/2024 1123 Last data filed at 01/20/2024 1823 Gross per 24 hour  Intake 100 ml  Output --  Net 100 ml   Filed Weights   01/18/24 0537 01/18/24 1042  Weight: 59 kg 57.4 kg   Body mass index is 18.16 kg/m.  Gen: 73 y.o. male in no apparent distress.  Nontoxic Pulm: Non-labored breathing.  Clear to auscultation bilaterally.  CV: Regular rate and rhythm. No murmur, rub, or gallop. No JVD GI: Abdomen soft, non-tender, non-distended Ext: Warm, no deformities Skin: No rashes, lesions  Neuro: Alert and oriented to person and place.  No obvious focal neurological  deficits. Psych: Calm  Judgement and insight appear normal. Mood & affect appropriate.     I have personally reviewed the following labs and images: CBC: Recent Labs  Lab 01/18/24 0542 01/19/24 0231 01/21/24 0817  WBC 9.9 8.5 8.1  NEUTROABS 7.2  --   --   HGB 10.2* 9.6* 9.5*  HCT 32.7* 29.9* 29.3*  MCV 87.2 84.9 83.7  PLT 166 164 210   BMP &GFR Recent Labs  Lab 01/18/24 0542 01/19/24 0231 01/20/24 0215 01/21/24 0817  NA 137 135 139 137  K 4.2 3.9 4.4 4.1  CL 103 106 109 109  CO2 18* 19* 20* 20*  GLUCOSE 173* 301* 237* 134*  BUN 69* 65* 57* 53*  CREATININE 4.39* 4.27* 4.01* 3.79*  CALCIUM  8.6* 8.5* 8.8* 9.1   Estimated Creatinine Clearance: 13.7 mL/min (A) (by C-G formula based on SCr of 3.79 mg/dL (H)). Liver & Pancreas: Recent Labs  Lab 01/21/24 0817  AST 10*  ALT 7  ALKPHOS 45  BILITOT 0.2  PROT 6.2*  ALBUMIN 2.6*   No results for input(s): LIPASE, AMYLASE in the last 168 hours. No results for input(s): AMMONIA in the last 168 hours. Diabetic: Recent Labs    01/18/24 1418  HGBA1C 8.3*   Recent Labs  Lab 01/20/24 1210 01/20/24 1613 01/20/24 2012 01/21/24 0644 01/21/24 0750  GLUCAP 210* 302* 183* 177* 159*   Cardiac Enzymes: No results for input(s): CKTOTAL, CKMB, CKMBINDEX, TROPONINI in the last 168 hours. No results for input(s): PROBNP in the last 8760 hours. Coagulation Profile: No results for input(s): INR, PROTIME in the last 168 hours. Thyroid  Function Tests: No results for input(s): TSH, T4TOTAL, FREET4, T3FREE, THYROIDAB in the last 72 hours. Lipid Profile: No results for input(s): CHOL, HDL, LDLCALC, TRIG, CHOLHDL, LDLDIRECT in the last 72 hours. Anemia Panel: No results for input(s): VITAMINB12, FOLATE, FERRITIN, TIBC, IRON, RETICCTPCT in the last 72 hours. Urine analysis:    Component Value Date/Time   COLORURINE YELLOW 01/20/2024 1233   APPEARANCEUR CLOUDY (A) 01/20/2024  1233   LABSPEC 1.010 01/20/2024 1233   PHURINE 6.0 01/20/2024 1233   GLUCOSEU 50 (A) 01/20/2024 1233   HGBUR MODERATE (A) 01/20/2024 1233   HGBUR trace-lysed 06/16/2010 0833   BILIRUBINUR NEGATIVE 01/20/2024 1233   BILIRUBINUR n 05/22/2015 0927   KETONESUR NEGATIVE 01/20/2024 1233   PROTEINUR 100 (A) 01/20/2024 1233   UROBILINOGEN 0.2 05/22/2015 0927   UROBILINOGEN 0.2 07/31/2014 2130   NITRITE NEGATIVE 01/20/2024 1233   LEUKOCYTESUR LARGE (A) 01/20/2024 1233   Sepsis Labs: Invalid input(s): PROCALCITONIN, LACTICIDVEN  Microbiology: Recent Results (from the past 240 hours)  Urine Culture     Status: None (Preliminary result)   Collection Time: 01/20/24 12:33 PM   Specimen: Urine, Random  Result Value Ref Range Status   Specimen Description URINE, RANDOM  Final   Special Requests   Final  URINE, CLEAN CATCH Performed at [facility], [address] [place], [place] [address]    Culture PENDING  Incomplete   Report Status PENDING  Incomplete    Radiology Studies: US  Renal Transplant w/Doppler Result Date: 01/20/2024 CLINICAL DATA:  Acute kidney injury EXAM: ULTRASOUND OF RENAL TRANSPLANT WITH RENAL DOPPLER ULTRASOUND TECHNIQUE: Ultrasound examination of the renal transplant was performed with gray-scale, color and duplex doppler evaluation. COMPARISON:  None Available. FINDINGS: Transplant kidney location: RLQ Transplant Kidney: Renal measurements: 12.9 x 5.6 x 5.8 cm = volume: . Normal in size and parenchymal echogenicity. No evidence of mass or hydronephrosis. No peri-transplant fluid collection seen. Color flow in the main renal artery:  Yes Color flow in the main renal vein:  Yes Duplex Doppler Evaluation: Main Renal Artery Velocity: Not recorded Main Renal Artery Resistive Index: Not recorded Venous waveform in main renal vein:  Present Intrarenal resistive index in upper pole:  0.81 (normal 0.6-0.8; equivocal 0.8-0.9; abnormal >= 0.9) Intrarenal resistive index  in lower pole: 0.79 (normal 0.6-0.8; equivocal 0.8-0.9; abnormal >= 0.9) Bladder: Normal for degree of bladder distention. Other findings:  None. IMPRESSION: 1. No hydronephrosis. 2. Resistive indices in the upper and lower pole are normal/equivocal. 3. No peri-transplant fluid collection. Electronically Signed   By: Greig Pique M.D.   On: 01/20/2024 22:06    Scheduled Meds:  amLODipine   2.5 mg Oral Daily   apixaban   5 mg Oral BID   atorvastatin   40 mg Oral Daily   calcitRIOL   0.25 mcg Oral Q M,W,F   carvedilol   12.5 mg Oral BID WC   famotidine   20 mg Oral Daily   insulin  aspart  0-9 Units Subcutaneous TID WC   insulin  glargine  5 Units Subcutaneous Q2200   polyethylene glycol  34 g Oral Daily   predniSONE   5 mg Oral Q breakfast   tacrolimus   3 mg Oral BID   Continuous Infusions:  sodium chloride  Stopped (01/19/24 2155)   cefTRIAXone  (ROCEPHIN )  IV Stopped (01/20/24 1725)     LOS: 3 days   35 minutes with more than 50% spent in reviewing records, counseling patient/family and coordinating care.  Reyes VEAR Gaw, MD [facility] [email or web address] 01/21/2024, 11:23 AM

## 2024-01-21 NOTE — Progress Notes (Addendum)
 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 Baseline eGFR <20, last Cr 3.9 on 8/18. Renal txp us  without any acute findings -Cr yesterday back towards baseline, AM labs pending -continue with supportive care -no indication for renal replacement therapy at this time. Hoping to stay away from HD initiation during this admission -Avoid nephrotoxic medications including NSAIDs and iodinated intravenous contrast exposure unless the latter is absolutely indicated.  Preferred narcotic agents for pain control are hydromorphone , fentanyl , and methadone. Morphine  should not be used. Avoid Baclofen and avoid oral sodium phosphate and magnesium  citrate based laxatives / bowel preps. Continue strict Input and Output monitoring. Will monitor the patient closely with you and intervene or adjust therapy as indicated by changes in clinical status/labs  Immunosuppression Management: -home maintenance IS: tacrolimus  3mg  BID-continue; prednisolone 5mg  daily-continue. 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 Goal trough 5-7 Will continue intensive monitoring for drug levels and toxicity via regularly scheduled laboratory assays given the narrow therapeutic index of the immunosuppressive drug therapy listed above   HTN -initially hypotensive, resolved. BP stable   Cystitis -on rocephin , ucx pending   Toxic metabolic encephalopathy -seems to have resolved now, per primary   Anemia due to CKD -Transfuse for Hgb<7 g/dL, Hgb 9.5 today   Uncontrolled Diabetes Mellitus Type 2  -per primary  Addendum: labs reviewed this afternoon with patient. Discussed with patient's daughter over the phone. Cr improving. No changes from our end at  this time.  Subjective:   Patient seen and examined bedside. No complaints/acute events overnight. No complaints. AM labs pending   Objective:   BP (!) 142/73 (BP Location: Left Arm)   Pulse 84   Temp 98.5 F (36.9 C) (Oral)   Resp 16   Ht 5' 10 (1.778 m)   Wt 57.4 kg   SpO2 100%   BMI 18.16 kg/m   Intake/Output Summary (Last 24 hours) at 01/21/2024 0913 Last data filed at 01/20/2024 1823 Gross per 24 hour  Intake 100 ml  Output --  Net 100 ml   Weight change:   Physical Exam: Gen: NAD CVS: RRR Resp: CTA BL Abd: soft Ext: trace LLE edema (chronic issue), no edema RLE Neuro: awake, alert Dialysis access: RUE AVF +b/t  Imaging: US  Renal Transplant w/Doppler Result Date: 01/20/2024 CLINICAL DATA:  Acute kidney injury EXAM: ULTRASOUND OF RENAL TRANSPLANT WITH RENAL DOPPLER ULTRASOUND TECHNIQUE: Ultrasound examination of the renal transplant was performed with gray-scale, color and duplex doppler evaluation. COMPARISON:  None Available. FINDINGS: Transplant kidney location: RLQ Transplant Kidney: Renal measurements: 12.9 x 5.6 x 5.8 cm = volume: . Normal in size and parenchymal echogenicity. No evidence of mass or hydronephrosis. No peri-transplant fluid collection seen. Color flow in the main renal artery:  Yes Color flow in the main renal vein:  Yes Duplex Doppler Evaluation: Main Renal Artery Velocity: Not recorded Main Renal Artery Resistive Index: Not recorded Venous waveform in main renal vein:  Present Intrarenal resistive index in upper pole:  0.81 (normal 0.6-0.8; equivocal 0.8-0.9; abnormal >= 0.9) Intrarenal resistive index in lower pole: 0.79 (normal 0.6-0.8; equivocal 0.8-0.9; abnormal >= 0.9) Bladder: Normal for degree of bladder distention. Other findings:  None. IMPRESSION: 1. No hydronephrosis. 2. Resistive indices in the upper and lower pole  are normal/equivocal. 3. No peri-transplant fluid collection. Electronically Signed   By: Greig Pique M.D.   On: 01/20/2024  22:06    Labs: BMET Recent Labs  Lab 01/18/24 0542 01/19/24 0231 01/20/24 0215  NA 137 135 139  K 4.2 3.9 4.4  CL 103 106 109  CO2 18* 19* 20*  GLUCOSE 173* 301* 237*  BUN 69* 65* 57*  CREATININE 4.39* 4.27* 4.01*  CALCIUM  8.6* 8.5* 8.8*   CBC Recent Labs  Lab 01/18/24 0542 01/19/24 0231 01/21/24 0817  WBC 9.9 8.5 8.1  NEUTROABS 7.2  --   --   HGB 10.2* 9.6* 9.5*  HCT 32.7* 29.9* 29.3*  MCV 87.2 84.9 83.7  PLT 166 164 210    Medications:     amLODipine   2.5 mg Oral Daily   apixaban   5 mg Oral BID   atorvastatin   40 mg Oral Daily   calcitRIOL   0.25 mcg Oral Q M,W,F   carvedilol   12.5 mg Oral BID WC   famotidine   20 mg Oral Daily   insulin  aspart  0-9 Units Subcutaneous TID WC   insulin  glargine  5 Units Subcutaneous Q2200   polyethylene glycol  34 g Oral Daily   predniSONE   5 mg Oral Q breakfast   tacrolimus   3 mg Oral BID      Ephriam Stank, MD [facility] 01/21/2024, 9:13 AM

## 2024-01-21 NOTE — Care Management Important Message (Signed)
 Important Message  Patient Details  Name: [name] [name] MRN: [id number] Date of Birth: Oct 28, 1950   Important Message Given:  Yes - Medicare IM     Claretta Deed 01/21/2024, 3:04 PM

## 2024-01-21 NOTE — Progress Notes (Signed)
 Mobility Specialist Progress Note:   01/21/24 0930  Mobility  Activity Ambulated with assistance  Level of Assistance Minimal assist, patient does 75% or more  Assistive Device Other (Comment) (HHA)  Distance Ambulated (ft) 300 ft  Activity Response Tolerated well  Mobility Referral Yes  Mobility visit 1 Mobility  Mobility Specialist Start Time (ACUTE ONLY) 0930  Mobility Specialist Stop Time (ACUTE ONLY) 0945  Mobility Specialist Time Calculation (min) (ACUTE ONLY) 15 min   Pt agreeable to mobility session. Required up to minA via HHA to steady d/t multiple LOBs throughout ambulation. Pt with no c/o throughout. Back in bed with all needs met, alarm on.  Therisa Rana Mobility Specialist Please contact via SecureChat or  Rehab office at [phone]

## 2024-01-22 DIAGNOSIS — N179 Acute kidney failure, unspecified: Secondary | ICD-10-CM | POA: Diagnosis not present

## 2024-01-22 LAB — RENAL FUNCTION PANEL
Albumin: 2.6 g/dL — ABNORMAL LOW (ref 3.5–5.0)
Anion gap: 10 (ref 5–15)
BUN: 50 mg/dL — ABNORMAL HIGH (ref 8–23)
CO2: 20 mmol/L — ABNORMAL LOW (ref 22–32)
Calcium: 9.1 mg/dL (ref 8.9–10.3)
Chloride: 107 mmol/L (ref 98–111)
Creatinine, Ser: 3.79 mg/dL — ABNORMAL HIGH (ref 0.61–1.24)
GFR, Estimated: 16 mL/min — ABNORMAL LOW (ref >60)
Glucose, Bld: 232 mg/dL — ABNORMAL HIGH (ref 70–99)
Phosphorus: 2.7 mg/dL (ref 2.5–4.6)
Potassium: 4.7 mmol/L (ref 3.5–5.1)
Sodium: 137 mmol/L (ref 135–145)

## 2024-01-22 LAB — CBC
HCT: 30.5 % — ABNORMAL LOW (ref 39.0–52.0)
Hemoglobin: 9.6 g/dL — ABNORMAL LOW (ref 13.0–17.0)
MCH: 26.7 pg (ref 26.0–34.0)
MCHC: 31.5 g/dL (ref 30.0–36.0)
MCV: 84.7 fL (ref 80.0–100.0)
Platelets: 225 K/uL (ref 150–400)
RBC: 3.6 MIL/uL — ABNORMAL LOW (ref 4.22–5.81)
RDW: 13.2 % (ref 11.5–15.5)
WBC: 8.7 K/uL (ref 4.0–10.5)
nRBC: 0 % (ref 0.0–0.2)

## 2024-01-22 LAB — URINE CULTURE: Culture: >=100000 — AB

## 2024-01-22 LAB — GLUCOSE, CAPILLARY
Glucose-Capillary: 141 mg/dL — ABNORMAL HIGH (ref 70–99)
Glucose-Capillary: 160 mg/dL — ABNORMAL HIGH (ref 70–99)
Glucose-Capillary: 121 mg/dL — ABNORMAL HIGH (ref 70–99)
Glucose-Capillary: 260 mg/dL — ABNORMAL HIGH (ref 70–99)

## 2024-01-22 MED ORDER — CEFADROXIL 500 MG PO CAPS
500.0000 mg | ORAL_CAPSULE | Freq: Every day | ORAL | Status: DC
  Administered 2024-01-22 – 2024-01-23 (×2): 500 mg via ORAL
  Filled 2024-01-22 (×2): qty 1

## 2024-01-22 NOTE — Discharge Instructions (Signed)
 Information on my medicine - ELIQUIS (apixaban)  This medication education was reviewed with me or my healthcare representative as part of my discharge preparation.    Why was Eliquis prescribed for you? Eliquis was prescribed to treat blood clots that may have been found in the veins of your legs (deep vein thrombosis) or in your lungs (pulmonary embolism) and to reduce the risk of them occurring again.  What do You need to know about Eliquis ? Continue Eliquis 5 mg tablet taken TWICE daily.  Eliquis may be taken with or without food.   Try to take the dose about the same time in the morning and in the evening. If you have difficulty swallowing the tablet whole please discuss with your pharmacist how to take the medication safely.  Take Eliquis exactly as prescribed and DO NOT stop taking Eliquis without talking to the doctor who prescribed the medication.  Stopping may increase your risk of developing a new blood clot.  Refill your prescription before you run out.  After discharge, you should have regular check-up appointments with your healthcare provider that is prescribing your Eliquis.    What do you do if you miss a dose? If a dose of ELIQUIS is not taken at the scheduled time, take it as soon as possible on the same day and twice-daily administration should be resumed. The dose should not be doubled to make up for a missed dose.  Important Safety Information A possible side effect of Eliquis is bleeding. You should call your healthcare provider right away if you experience any of the following: Bleeding from an injury or your nose that does not stop. Unusual colored urine (red or dark brown) or unusual colored stools (red or black). Unusual bruising for unknown reasons. A serious fall or if you hit your head (even if there is no bleeding).  Some medicines may interact with Eliquis and might increase your risk of bleeding or clotting while on Eliquis. To help avoid this,  consult your healthcare provider or pharmacist prior to using any new prescription or non-prescription medications, including herbals, vitamins, non-steroidal anti-inflammatory drugs (NSAIDs) and supplements.  This website has more information on Eliquis (apixaban): http://[email or web address].com/eliquis/home

## 2024-01-22 NOTE — Progress Notes (Signed)
 Urine culture came back with klebsiella and proteus that are sens to cefazolin. Ok to optimize therapy to PO cefadroxil  to complete 5d per Dr Elpidio.  Sergio Batch, PharmD, BCIDP, AAHIVP, CPP Infectious Disease Pharmacist 01/22/2024 10:10 AM

## 2024-01-22 NOTE — Progress Notes (Signed)
 Mobility Specialist: Progress Note   01/22/24 1500  Mobility  Activity Ambulated with assistance  Level of Assistance Contact guard assist, steadying assist  Assistive Device Front wheel walker  Distance Ambulated (ft) 500 ft  Activity Response Tolerated well  Mobility Referral Yes  Mobility visit 1 Mobility  Mobility Specialist Start Time (ACUTE ONLY) 1001  Mobility Specialist Stop Time (ACUTE ONLY) 1024  Mobility Specialist Time Calculation (min) (ACUTE ONLY) 23 min    Pt received in bed, pleasant and agreeable to mobility session. MinG throughout. 1x LOB requiring light minA to correct. No complaints. Returned to room without fault. Ambulated to the BR, pericare done ind. Left in bed with all needs met, call bell in reach. Bed alarm on.   Ileana Lute Mobility Specialist Please contact via SecureChat or Rehab office at [phone]

## 2024-01-22 NOTE — Progress Notes (Signed)
 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 Baseline eGFR <20, last Cr 3.9 on 8/18. Renal txp us  without any acute findings -Cr stable and around baseline: 3.79 -continue with supportive care--stopped fluids, encouraged PO hydration -no indication for renal replacement therapy at this time. Hoping to stay away from HD initiation during this admission -Avoid nephrotoxic medications including NSAIDs and iodinated intravenous contrast exposure unless the latter is absolutely indicated.  Preferred narcotic agents for pain control are hydromorphone , fentanyl , and methadone. Morphine  should not be used. Avoid Baclofen and avoid oral sodium phosphate and magnesium  citrate based laxatives / bowel preps. Continue strict Input and Output monitoring. Will monitor the patient closely with you and intervene or adjust therapy as indicated by changes in clinical status/labs  Immunosuppression Management: -home maintenance IS: tacrolimus  3mg  BID-continue; prednisolone 5mg  daily-continue. 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 Goal trough 5-7. Can follow up outpatient Will continue intensive monitoring for drug levels and toxicity via regularly scheduled laboratory assays given the narrow therapeutic index of the immunosuppressive drug therapy listed above   HTN -initially hypotensive, resolved. BP stable   Cystitis -on rocephin , per primary   Toxic metabolic encephalopathy -seems to have resolved now, per primary   Anemia due to CKD -Transfuse for Hgb<7 g/dL, Hgb 9.6 & stable today   Uncontrolled Diabetes Mellitus Type 2  -per primary  Nothing else to add from an inpatient nephrology perspective, okay for discharge from  my end. Will sign off. Will arrange for follow up with Dr. Marlee.  Ephriam Stank, MD [facility]  Subjective:   Patient seen and examined bedside. No complaints/acute events overnight. No complaints. AM labs pending   Objective:   BP 126/74   Pulse 79   Temp 98.8 F (37.1 C) (Oral)   Resp 19   Ht 5' 10 (1.778 m)   Wt 57.4 kg   SpO2 100%   BMI 18.16 kg/m   Intake/Output Summary (Last 24 hours) at 01/22/2024 0911 Last data filed at 01/22/2024 0550 Gross per 24 hour  Intake 629.97 ml  Output 200 ml  Net 429.97 ml   Weight change:   Physical Exam: Gen: NAD CVS: RRR Resp: CTA BL Abd: soft Ext: trace LLE edema (chronic issue), no edema RLE Neuro: awake, alert Dialysis access: RUE AVF +b/t  Imaging: US  Renal Transplant w/Doppler Result Date: 01/20/2024 CLINICAL DATA:  Acute kidney injury EXAM: ULTRASOUND OF RENAL TRANSPLANT WITH RENAL DOPPLER ULTRASOUND TECHNIQUE: Ultrasound examination of the renal transplant was performed with gray-scale, color and duplex doppler evaluation. COMPARISON:  None Available. FINDINGS: Transplant kidney location: RLQ Transplant Kidney: Renal measurements: 12.9 x 5.6 x 5.8 cm = volume: . Normal in size and parenchymal echogenicity. No evidence of mass or hydronephrosis. No peri-transplant fluid collection seen. Color flow in the main renal artery:  Yes Color flow in the main renal vein:  Yes Duplex Doppler Evaluation: Main Renal Artery Velocity: Not recorded Main Renal Artery Resistive Index: Not recorded Venous waveform in main renal vein:  Present Intrarenal resistive index in upper pole:  0.81 (normal 0.6-0.8; equivocal 0.8-0.9; abnormal >= 0.9) Intrarenal resistive index in lower pole: 0.79 (normal 0.6-0.8; equivocal 0.8-0.9; abnormal >= 0.9) Bladder: Normal for degree of bladder distention. Other findings:  None.  IMPRESSION: 1. No hydronephrosis. 2. Resistive indices in the upper and lower pole are normal/equivocal. 3. No  peri-transplant fluid collection. Electronically Signed   By: Greig Pique M.D.   On: 01/20/2024 22:06    Labs: BMET Recent Labs  Lab 01/18/24 0542 01/19/24 0231 01/20/24 0215 01/21/24 0817 01/22/24 0204  NA 137 135 139 137 137  K 4.2 3.9 4.4 4.1 4.7  CL 103 106 109 109 107  CO2 18* 19* 20* 20* 20*  GLUCOSE 173* 301* 237* 134* 232*  BUN 69* 65* 57* 53* 50*  CREATININE 4.39* 4.27* 4.01* 3.79* 3.79*  CALCIUM  8.6* 8.5* 8.8* 9.1 9.1  PHOS  --   --   --   --  2.7   CBC Recent Labs  Lab 01/18/24 0542 01/19/24 0231 01/21/24 0817 01/22/24 0204  WBC 9.9 8.5 8.1 8.7  NEUTROABS 7.2  --   --   --   HGB 10.2* 9.6* 9.5* 9.6*  HCT 32.7* 29.9* 29.3* 30.5*  MCV 87.2 84.9 83.7 84.7  PLT 166 164 210 225    Medications:     amLODipine   2.5 mg Oral Daily   apixaban   5 mg Oral BID   atorvastatin   40 mg Oral Daily   calcitRIOL   0.25 mcg Oral Q M,W,F   carvedilol   12.5 mg Oral BID WC   famotidine   20 mg Oral Daily   insulin  aspart  0-9 Units Subcutaneous TID WC   insulin  glargine  5 Units Subcutaneous Q2200   polyethylene glycol  34 g Oral Daily   predniSONE   5 mg Oral Q breakfast   tacrolimus   3 mg Oral BID      Ephriam Stank, MD [facility] 01/22/2024, 9:11 AM

## 2024-01-22 NOTE — Progress Notes (Signed)
 PROGRESS NOTE  [name] [name]  FMW:[id number] DOB: Apr 20, 1951 DOA: 01/18/2024 PCP: Theophilus Andrews, Tully GRADE, MD  Consultants  Brief Narrative: [name] [name] is a 73 y.o. male with medical history significant of  renal transplant on immunosuppression therapy, PE on Eliquis , HTN, HLD, DM2 p/w AKI on CKD p/w confusion at home.  The patient reported feeling disoriented, possibly due to low lighting, and mistakenly entered another room. While attempting to find the bed in what the patient believed to be their own room, the patient fell after slipping off a bed and landing on plastic bags. The patient remembered the fall and did not lose consciousness. The patient's wife activated EMS, who noted low blood pressure upon arrival.    In the ED, pt hypotensive and had low BP per EMS as well. Labs notable for BUN/Cr 69/4.39 (baseline Cr 2.7-3), and troponins 29-->35. EDP requested admission for AKI on CKD.   New events last 24 hours / Subjective:  Resting in bed. Reports normal appetite but still no bm for 3 days.    Assessment & Plan:  Principal Problem:   AKI (acute kidney injury) ([name]) Active Problems:   Pulmonary embolism ([name])   Benign prostatic hyperplasia without lower urinary tract symptoms   CKD (chronic kidney disease), stage IV ([name])   Essential hypertension   Other hyperlipidemia   Elevated troponin   Immunosuppressed status ([name])   Kidney transplanted   AMS (altered mental status)   Type 2 diabetes mellitus ([name])   AKI on CKD stage 4 - History of renal transplant - Baseline GFR <20 per nephrology, last Cr 3.9 on 8/18.   - appreciate nephrology input --> Cr trending back towards baseline.    - Fluids stopped today.  Will watch overnight on oral hydration to ensure he is continuing to remain hydrated well.   Acute cystitis: No clear uti symptoms but daughter reports prior episodes of aki with uti. Urinalysis from 9/2 does appear infected. Nephrology favors treating - Urine  culture now growing both Proteus and Klebsiella.  Sensitive to cefazolin, starting p.o. cefadroxil  for total 5 days abx.   Acute metabolic encephalopathy - Transient, resolved upon presentation to ED.  Now at baseline which includes some mild confusion. CT head neagive   Status post renal transplant - Tacrolimus , prednisone  - appreciate renal input.     Constipation - on miralax , no BM as of yet, follow.   - can add Senna if still no BM  - If able to have BM and creatinine continues at current range on oral hydration likely be able to discharge tomorrow 9/7.   Ambulatory dysfunction - PT consult   History of PE - Eliquis    Hypertension Bp controlled - Amlodipine , Coreg    Hyperlipidemia - Crestor  --> Lipitor due to renal function rec by pharmacy    Diabetes mellitus - Hemoglobin A1c 8.3 - euglycemic here - Sliding scale insulin    Demand ischemia - Troponin 29, 35. asymptomatic  DVT prophylaxis:   apixaban  (ELIQUIS ) tablet 5 mg  Code Status:   Code Status: Full Code Level of care: Telemetry Medical Status is: Inpatient   Consults called: nephrology   Subjective: Patient awake, alert, eating breakfast.  No concerns or complaints.   Objective: Vitals:   01/22/24 0004 01/22/24 0338 01/22/24 0816 01/22/24 0836  BP: 131/84 (!) 144/96 126/74 126/74  Pulse: (!) 109 (!) 110 79 79  Resp: 16 16 19    Temp: 98.6 F (37 C) 99.1 F (37.3 C) 98.8 F (37.1  C)   TempSrc:  Oral Oral   SpO2: 100% 100% 100%   Weight:      Height:        Intake/Output Summary (Last 24 hours) at 01/22/2024 1148 Last data filed at 01/22/2024 0550 Gross per 24 hour  Intake 629.97 ml  Output 200 ml  Net 429.97 ml   Filed Weights   01/18/24 0537 01/18/24 1042  Weight: 59 kg 57.4 kg   Body mass index is 18.16 kg/m.  Gen: 73 y.o. male in no apparent distress.  Nontoxic Pulm: Non-labored breathing.  Clear to auscultation bilaterally.  CV: Regular rate and rhythm. No murmur, rub, or  gallop. No JVD GI: Abdomen soft, non-tender, non-distended Ext: Warm, no deformities Skin: No rashes, lesions  Neuro: Alert and oriented to person and place.  No obvious focal neurological deficits. Psych: Calm  Judgement and insight appear normal. Mood & affect appropriate.     I have personally reviewed the following labs and images: CBC: Recent Labs  Lab 01/18/24 0542 01/19/24 0231 01/21/24 0817 01/22/24 0204  WBC 9.9 8.5 8.1 8.7  NEUTROABS 7.2  --   --   --   HGB 10.2* 9.6* 9.5* 9.6*  HCT 32.7* 29.9* 29.3* 30.5*  MCV 87.2 84.9 83.7 84.7  PLT 166 164 210 225   BMP &GFR Recent Labs  Lab 01/18/24 0542 01/19/24 0231 01/20/24 0215 01/21/24 0817 01/22/24 0204  NA 137 135 139 137 137  K 4.2 3.9 4.4 4.1 4.7  CL 103 106 109 109 107  CO2 18* 19* 20* 20* 20*  GLUCOSE 173* 301* 237* 134* 232*  BUN 69* 65* 57* 53* 50*  CREATININE 4.39* 4.27* 4.01* 3.79* 3.79*  CALCIUM  8.6* 8.5* 8.8* 9.1 9.1  PHOS  --   --   --   --  2.7   Estimated Creatinine Clearance: 13.7 mL/min (A) (by C-G formula based on SCr of 3.79 mg/dL (H)). Liver & Pancreas: Recent Labs  Lab 01/21/24 0817 01/22/24 0204  AST 10*  --   ALT 7  --   ALKPHOS 45  --   BILITOT 0.2  --   PROT 6.2*  --   ALBUMIN 2.6* 2.6*   No results for input(s): LIPASE, AMYLASE in the last 168 hours. No results for input(s): AMMONIA in the last 168 hours. Diabetic: No results for input(s): HGBA1C in the last 72 hours.  Recent Labs  Lab 01/21/24 0750 01/21/24 1231 01/21/24 1651 01/21/24 2048 01/22/24 0817  GLUCAP 159* 229* 253* 201* 141*   Cardiac Enzymes: No results for input(s): CKTOTAL, CKMB, CKMBINDEX, TROPONINI in the last 168 hours. No results for input(s): PROBNP in the last 8760 hours. Coagulation Profile: No results for input(s): INR, PROTIME in the last 168 hours. Thyroid  Function Tests: No results for input(s): TSH, T4TOTAL, FREET4, T3FREE, THYROIDAB in the last 72  hours. Lipid Profile: No results for input(s): CHOL, HDL, LDLCALC, TRIG, CHOLHDL, LDLDIRECT in the last 72 hours. Anemia Panel: No results for input(s): VITAMINB12, FOLATE, FERRITIN, TIBC, IRON, RETICCTPCT in the last 72 hours. Urine analysis:    Component Value Date/Time   COLORURINE YELLOW 01/20/2024 1233   APPEARANCEUR CLOUDY (A) 01/20/2024 1233   LABSPEC 1.010 01/20/2024 1233   PHURINE 6.0 01/20/2024 1233   GLUCOSEU 50 (A) 01/20/2024 1233   HGBUR MODERATE (A) 01/20/2024 1233   HGBUR trace-lysed 06/16/2010 0833   BILIRUBINUR NEGATIVE 01/20/2024 1233   BILIRUBINUR n 05/22/2015 0927   KETONESUR NEGATIVE 01/20/2024 1233   PROTEINUR 100 (A)  01/20/2024 1233   UROBILINOGEN 0.2 05/22/2015 0927   UROBILINOGEN 0.2 07/31/2014 2130   NITRITE NEGATIVE 01/20/2024 1233   LEUKOCYTESUR LARGE (A) 01/20/2024 1233   Sepsis Labs: Invalid input(s): PROCALCITONIN, LACTICIDVEN  Microbiology: Recent Results (from the past 240 hours)  Urine Culture     Status: Abnormal   Collection Time: 01/20/24 12:33 PM   Specimen: Urine, Random  Result Value Ref Range Status   Specimen Description URINE, RANDOM  Final   Special Requests   Final    URINE, CLEAN CATCH Performed at [facility], [address], [place], [place] [address]    Culture (A)  Final    >=100,000 COLONIES/mL KLEBSIELLA PNEUMONIAE 10,000 COLONIES/mL PROTEUS MIRABILIS    Report Status 01/22/2024 FINAL  Final   Organism ID, Bacteria KLEBSIELLA PNEUMONIAE (A)  Final   Organism ID, Bacteria PROTEUS MIRABILIS (A)  Final      Susceptibility   Klebsiella pneumoniae - MIC*    AMPICILLIN >=32 RESISTANT Resistant     CEFAZOLIN (URINE) Value in next row Sensitive      2 SENSITIVEThis is a modified FDA-approved test that has been validated and its performance characteristics determined by the reporting laboratory.  This laboratory is certified under the Clinical Laboratory Improvement Amendments CLIA as  qualified to perform high complexity clinical laboratory testing.    CEFEPIME  Value in next row Sensitive      2 SENSITIVEThis is a modified FDA-approved test that has been validated and its performance characteristics determined by the reporting laboratory.  This laboratory is certified under the Clinical Laboratory Improvement Amendments CLIA as qualified to perform high complexity clinical laboratory testing.    ERTAPENEM Value in next row Sensitive      2 SENSITIVEThis is a modified FDA-approved test that has been validated and its performance characteristics determined by the reporting laboratory.  This laboratory is certified under the Clinical Laboratory Improvement Amendments CLIA as qualified to perform high complexity clinical laboratory testing.    CEFTRIAXONE  Value in next row Sensitive      2 SENSITIVEThis is a modified FDA-approved test that has been validated and its performance characteristics determined by the reporting laboratory.  This laboratory is certified under the Clinical Laboratory Improvement Amendments CLIA as qualified to perform high complexity clinical laboratory testing.    CIPROFLOXACIN Value in next row Sensitive      2 SENSITIVEThis is a modified FDA-approved test that has been validated and its performance characteristics determined by the reporting laboratory.  This laboratory is certified under the Clinical Laboratory Improvement Amendments CLIA as qualified to perform high complexity clinical laboratory testing.    GENTAMICIN  Value in next row Sensitive      2 SENSITIVEThis is a modified FDA-approved test that has been validated and its performance characteristics determined by the reporting laboratory.  This laboratory is certified under the Clinical Laboratory Improvement Amendments CLIA as qualified to perform high complexity clinical laboratory testing.    NITROFURANTOIN Value in next row Intermediate      2 SENSITIVEThis is a modified FDA-approved test that has  been validated and its performance characteristics determined by the reporting laboratory.  This laboratory is certified under the Clinical Laboratory Improvement Amendments CLIA as qualified to perform high complexity clinical laboratory testing.    TRIMETH/SULFA Value in next row Sensitive      2 SENSITIVEThis is a modified FDA-approved test that has been validated and its performance characteristics determined by the reporting laboratory.  This laboratory is certified under  the Clinical Laboratory Improvement Amendments CLIA as qualified to perform high complexity clinical laboratory testing.    AMPICILLIN/SULBACTAM Value in next row Sensitive      2 SENSITIVEThis is a modified FDA-approved test that has been validated and its performance characteristics determined by the reporting laboratory.  This laboratory is certified under the Clinical Laboratory Improvement Amendments CLIA as qualified to perform high complexity clinical laboratory testing.    PIP/TAZO Value in next row Sensitive ug/mL     <=4 SENSITIVEThis is a modified FDA-approved test that has been validated and its performance characteristics determined by the reporting laboratory.  This laboratory is certified under the Clinical Laboratory Improvement Amendments CLIA as qualified to perform high complexity clinical laboratory testing.    MEROPENEM Value in next row Sensitive      <=4 SENSITIVEThis is a modified FDA-approved test that has been validated and its performance characteristics determined by the reporting laboratory.  This laboratory is certified under the Clinical Laboratory Improvement Amendments CLIA as qualified to perform high complexity clinical laboratory testing.    * >=100,000 COLONIES/mL KLEBSIELLA PNEUMONIAE   Proteus mirabilis - MIC*    AMPICILLIN Value in next row Sensitive      <=4 SENSITIVEThis is a modified FDA-approved test that has been validated and its performance characteristics determined by the reporting  laboratory.  This laboratory is certified under the Clinical Laboratory Improvement Amendments CLIA as qualified to perform high complexity clinical laboratory testing.    CEFAZOLIN (URINE) Value in next row Sensitive      4 SENSITIVEThis is a modified FDA-approved test that has been validated and its performance characteristics determined by the reporting laboratory.  This laboratory is certified under the Clinical Laboratory Improvement Amendments CLIA as qualified to perform high complexity clinical laboratory testing.    CEFEPIME  Value in next row Sensitive      4 SENSITIVEThis is a modified FDA-approved test that has been validated and its performance characteristics determined by the reporting laboratory.  This laboratory is certified under the Clinical Laboratory Improvement Amendments CLIA as qualified to perform high complexity clinical laboratory testing.    ERTAPENEM Value in next row Sensitive      4 SENSITIVEThis is a modified FDA-approved test that has been validated and its performance characteristics determined by the reporting laboratory.  This laboratory is certified under the Clinical Laboratory Improvement Amendments CLIA as qualified to perform high complexity clinical laboratory testing.    CEFTRIAXONE  Value in next row Sensitive      4 SENSITIVEThis is a modified FDA-approved test that has been validated and its performance characteristics determined by the reporting laboratory.  This laboratory is certified under the Clinical Laboratory Improvement Amendments CLIA as qualified to perform high complexity clinical laboratory testing.    CIPROFLOXACIN Value in next row Sensitive      4 SENSITIVEThis is a modified FDA-approved test that has been validated and its performance characteristics determined by the reporting laboratory.  This laboratory is certified under the Clinical Laboratory Improvement Amendments CLIA as qualified to perform high complexity clinical laboratory testing.     GENTAMICIN  Value in next row Sensitive      4 SENSITIVEThis is a modified FDA-approved test that has been validated and its performance characteristics determined by the reporting laboratory.  This laboratory is certified under the Clinical Laboratory Improvement Amendments CLIA as qualified to perform high complexity clinical laboratory testing.    NITROFURANTOIN Value in next row Resistant      4 SENSITIVEThis is a  modified FDA-approved test that has been validated and its performance characteristics determined by the reporting laboratory.  This laboratory is certified under the Clinical Laboratory Improvement Amendments CLIA as qualified to perform high complexity clinical laboratory testing.    TRIMETH/SULFA Value in next row Sensitive      4 SENSITIVEThis is a modified FDA-approved test that has been validated and its performance characteristics determined by the reporting laboratory.  This laboratory is certified under the Clinical Laboratory Improvement Amendments CLIA as qualified to perform high complexity clinical laboratory testing.    AMPICILLIN/SULBACTAM Value in next row Sensitive      4 SENSITIVEThis is a modified FDA-approved test that has been validated and its performance characteristics determined by the reporting laboratory.  This laboratory is certified under the Clinical Laboratory Improvement Amendments CLIA as qualified to perform high complexity clinical laboratory testing.    PIP/TAZO Value in next row Sensitive ug/mL     <=4 SENSITIVEThis is a modified FDA-approved test that has been validated and its performance characteristics determined by the reporting laboratory.  This laboratory is certified under the Clinical Laboratory Improvement Amendments CLIA as qualified to perform high complexity clinical laboratory testing.    MEROPENEM Value in next row Sensitive      <=4 SENSITIVEThis is a modified FDA-approved test that has been validated and its performance characteristics  determined by the reporting laboratory.  This laboratory is certified under the Clinical Laboratory Improvement Amendments CLIA as qualified to perform high complexity clinical laboratory testing.    * 10,000 COLONIES/mL PROTEUS MIRABILIS    Radiology Studies: No results found.   Scheduled Meds:  amLODipine   2.5 mg Oral Daily   apixaban   5 mg Oral BID   atorvastatin   40 mg Oral Daily   calcitRIOL   0.25 mcg Oral Q M,W,F   carvedilol   12.5 mg Oral BID WC   cefadroxil   500 mg Oral Daily   famotidine   20 mg Oral Daily   insulin  aspart  0-9 Units Subcutaneous TID WC   insulin  glargine  5 Units Subcutaneous Q2200   polyethylene glycol  34 g Oral Daily   predniSONE   5 mg Oral Q breakfast   tacrolimus   3 mg Oral BID   Continuous Infusions:     LOS: 4 days   35 minutes with more than 50% spent in reviewing records, counseling patient/family and coordinating care.  Reyes VEAR Gaw, MD [facility] [email or web address] 01/22/2024, 11:48 AM

## 2024-01-22 NOTE — Plan of Care (Signed)

## 2024-01-22 NOTE — Plan of Care (Signed)
  Problem: Clinical Measurements: Goal: Ability to maintain clinical measurements within normal limits will improve Outcome: Progressing   Problem: Clinical Measurements: Goal: Ability to maintain clinical measurements within normal limits will improve Outcome: Progressing Goal: Will remain free from infection Outcome: Progressing Goal: Diagnostic test results will improve Outcome: Progressing Goal: Respiratory complications will improve Outcome: Progressing Goal: Cardiovascular complication will be avoided Outcome: Progressing   

## 2024-01-23 ENCOUNTER — Inpatient Hospital Stay (HOSPITAL_COMMUNITY)

## 2024-01-23 DIAGNOSIS — N179 Acute kidney failure, unspecified: Secondary | ICD-10-CM | POA: Diagnosis not present

## 2024-01-23 LAB — BASIC METABOLIC PANEL WITH GFR
Anion gap: 8 (ref 5–15)
BUN: 47 mg/dL — ABNORMAL HIGH (ref 8–23)
CO2: 20 mmol/L — ABNORMAL LOW (ref 22–32)
Calcium: 9 mg/dL (ref 8.9–10.3)
Chloride: 106 mmol/L (ref 98–111)
Creatinine, Ser: 3.68 mg/dL — ABNORMAL HIGH (ref 0.61–1.24)
GFR, Estimated: 16 mL/min — ABNORMAL LOW (ref >60)
Glucose, Bld: 199 mg/dL — ABNORMAL HIGH (ref 70–99)
Potassium: 4.6 mmol/L (ref 3.5–5.1)
Sodium: 134 mmol/L — ABNORMAL LOW (ref 135–145)

## 2024-01-23 LAB — GLUCOSE, CAPILLARY
Glucose-Capillary: 118 mg/dL — ABNORMAL HIGH (ref 70–99)
Glucose-Capillary: 215 mg/dL — ABNORMAL HIGH (ref 70–99)

## 2024-01-23 LAB — CBC
HCT: 29.7 % — ABNORMAL LOW (ref 39.0–52.0)
Hemoglobin: 9.2 g/dL — ABNORMAL LOW (ref 13.0–17.0)
MCH: 26.5 pg (ref 26.0–34.0)
MCHC: 31 g/dL (ref 30.0–36.0)
MCV: 85.6 fL (ref 80.0–100.0)
Platelets: 225 K/uL (ref 150–400)
RBC: 3.47 MIL/uL — ABNORMAL LOW (ref 4.22–5.81)
RDW: 13.3 % (ref 11.5–15.5)
WBC: 7.6 K/uL (ref 4.0–10.5)
nRBC: 0 % (ref 0.0–0.2)

## 2024-01-23 LAB — TACROLIMUS LEVEL: Tacrolimus (FK506) - LabCorp: 5.2 ng/mL (ref 5.0–20.0)

## 2024-01-23 MED ORDER — BISACODYL 10 MG RE SUPP: 10.0000 mg | Freq: Every day | RECTAL | Status: DC | PRN

## 2024-01-23 MED ORDER — ATORVASTATIN CALCIUM 40 MG PO TABS: 40.0000 mg | ORAL_TABLET | Freq: Every day | ORAL | 0 refills | Status: AC

## 2024-01-23 MED ORDER — SENNOSIDES-DOCUSATE SODIUM 8.6-50 MG PO TABS
2.0000 | ORAL_TABLET | Freq: Once | ORAL | Status: AC
  Administered 2024-01-23: 2 via ORAL
  Filled 2024-01-23: qty 2

## 2024-01-23 MED ORDER — CEFADROXIL 500 MG PO CAPS: 500.0000 mg | ORAL_CAPSULE | Freq: Every day | ORAL | 0 refills | Status: DC

## 2024-01-23 NOTE — Plan of Care (Signed)

## 2024-01-23 NOTE — Progress Notes (Signed)
 Mobility Specialist: Progress Note   01/23/24 1200  Mobility  Activity Ambulated with assistance  Level of Assistance Contact guard assist, steadying assist  Assistive Device None  Distance Ambulated (ft) 650 ft  Activity Response Tolerated well  Mobility Referral Yes  Mobility visit 1 Mobility  Mobility Specialist Start Time (ACUTE ONLY) 0920  Mobility Specialist Stop Time (ACUTE ONLY) 0930  Mobility Specialist Time Calculation (min) (ACUTE ONLY) 10 min    Pt received in bed, agreeable to mobility session. No complaints. Mild unsteadiness that improved with further ambulation. Initally CGA but pt progressed to SV. Returned to room without fault. Left in bed with all needs met, call bell in reach.   Ileana Lute Mobility Specialist Please contact via SecureChat or Rehab office at [phone]

## 2024-01-23 NOTE — Progress Notes (Signed)
 DISCHARGE NOTE HOME PRICE [place] to be discharged Home per MD order. Discussed prescriptions and follow up appointments with the patient. Prescriptions given to patient; medication list explained in detail. Patient verbalized understanding.  Skin clean, dry and intact without evidence of skin break down, no evidence of skin tears noted. IV catheter discontinued intact. Site without signs and symptoms of complications. Dressing and pressure applied. Pt denies pain at the site currently. No complaints noted.  Patient free of lines, drains, and wounds.   An After Visit Summary (AVS) was printed and given to the patient by floor RN Bethany Patient escorted via wheelchair, and discharged home via private auto.  Peyton SHAUNNA Pepper, RN

## 2024-01-23 NOTE — Progress Notes (Addendum)
 PROGRESS NOTE  [name] [name]  FMW:[id number] DOB: 1951-04-22 DOA: 01/18/2024 PCP: Theophilus Andrews, Tully GRADE, MD  Consultants  Brief Narrative: [name] [name] is a 73 y.o. male with medical history significant of  renal transplant on immunosuppression therapy, PE on Eliquis , HTN, HLD, DM2 p/w AKI on CKD p/w confusion at home.  The patient reported feeling disoriented, possibly due to low lighting, and mistakenly entered another room. While attempting to find the bed in what the patient believed to be their own room, the patient fell after slipping off a bed and landing on plastic bags. The patient remembered the fall and did not lose consciousness. The patient's wife activated EMS, who noted low blood pressure upon arrival.    In the ED, pt hypotensive and had low BP per EMS as well. Labs notable for BUN/Cr 69/4.39 (baseline Cr 2.7-3), and troponins 29-->35. EDP requested admission for AKI on CKD.   New events last 24 hours / Subjective:  Resting in bed. Reports normal appetite but still no bm    Assessment & Plan:  Principal Problem:   AKI (acute kidney injury) ([name]) Active Problems:   Pulmonary embolism ([name])   Benign prostatic hyperplasia without lower urinary tract symptoms   CKD (chronic kidney disease), stage IV ([name])   Essential hypertension   Other hyperlipidemia   Elevated troponin   Immunosuppressed status ([name])   Kidney transplanted   AMS (altered mental status)   Type 2 diabetes mellitus ([name])   Constipation - Initially some concern for constipation.  However nursing reported patient was able to have bowel movement both yesterday and day before yesterday. - This was reason he was going to stay in house today because he was able to have bowel movements.  Actually discharge him home today. - Discussed with his daughter by phone  AKI on CKD stage 4 - History of renal transplant - Baseline GFR <20 per nephrology, last outpt Cr 3.9 on 8/18.   - appreciate nephrology  input --> Cr trending back towards baseline.    - Nephrology signed off 9/6.  Creatinine continued to improve today on just oral hydration.   - This was main hospital issue, now resolved.     Acute cystitis: No clear uti symptoms but daughter reports prior episodes of aki with uti. Urinalysis from 9/2 does appear infected. Nephrology favors treating - Urine culture now growing both Proteus and Klebsiella.  Sensitive to cefazolin, starting p.o. cefadroxil  for total 5 days abx. - Stop date 9/8.  Daily b/c of CrCl   Acute metabolic encephalopathy - Transient, resolved upon presentation to ED.  Now at baseline which includes some mild confusion. CT head neagive   Status post renal transplant - Tacrolimus , prednisone  - appreciate renal input.     Ambulatory dysfunction - PT consult-->recommending HHPT at [place]   History of PE - Eliquis    Hypertension Bp controlled - Amlodipine , Coreg    Hyperlipidemia - Crestor  --> Lipitor due to renal function rec by pharmacy    Diabetes mellitus - Hemoglobin A1c 8.3 - CBGs acceptable here.  - Sliding scale insulin    Demand ischemia - Troponin 29, 35. asymptomatic  DVT prophylaxis:  apixaban  (ELIQUIS ) tablet 5 mg  Code Status:   Code Status: Full Code Family communication: Called daughter and discussed plan to discharge home today. Level of care: Telemetry Medical Status is: Inpatient  Consults called: nephrology   Subjective: Patient awake, alert, eating breakfast.  No concerns or complaints  Objective: Vitals:   01/22/24 1934  01/22/24 2358 01/23/24 0345 01/23/24 0808  BP: 136/73 129/73 139/72 127/76  Pulse: 76 75 72 73  Resp: 16 15 15 16   Temp: 97.6 F (36.4 C) 98.1 F (36.7 C) 98.1 F (36.7 C) 97.6 F (36.4 C)  TempSrc: Oral Oral Oral   SpO2: 100% 100% 100% 100%  Weight:   60.4 kg   Height:        Intake/Output Summary (Last 24 hours) at 01/23/2024 1123 Last data filed at 01/23/2024 0300 Gross per 24 hour  Intake 236 ml   Output 875 ml  Net -639 ml   Filed Weights   01/18/24 0537 01/18/24 1042 01/23/24 0345  Weight: 59 kg 57.4 kg 60.4 kg   Body mass index is 19.11 kg/m.  Gen: 73 y.o. male in no apparent distress.  Nontoxic Pulm: Non-labored breathing.  Clear to auscultation bilaterally.  CV: Regular rate and rhythm. No murmur, rub, or gallop. No JVD GI: Abdomen soft, non-tender, non-distended.  Hypoactive bowel sounds.  Ext: Warm, no deformities Skin: No rashes, lesions  Neuro: Alert and oriented to person and place.  No obvious focal neurological deficits. Psych: Calm  Judgement and insight appear normal. Mood & affect appropriate.     I have personally reviewed the following labs and images: CBC: Recent Labs  Lab 01/18/24 0542 01/19/24 0231 01/21/24 0817 01/22/24 0204 01/23/24 0147  WBC 9.9 8.5 8.1 8.7 7.6  NEUTROABS 7.2  --   --   --   --   HGB 10.2* 9.6* 9.5* 9.6* 9.2*  HCT 32.7* 29.9* 29.3* 30.5* 29.7*  MCV 87.2 84.9 83.7 84.7 85.6  PLT 166 164 210 225 225   BMP &GFR Recent Labs  Lab 01/19/24 0231 01/20/24 0215 01/21/24 0817 01/22/24 0204 01/23/24 0147  NA 135 139 137 137 134*  K 3.9 4.4 4.1 4.7 4.6  CL 106 109 109 107 106  CO2 19* 20* 20* 20* 20*  GLUCOSE 301* 237* 134* 232* 199*  BUN 65* 57* 53* 50* 47*  CREATININE 4.27* 4.01* 3.79* 3.79* 3.68*  CALCIUM  8.5* 8.8* 9.1 9.1 9.0  PHOS  --   --   --  2.7  --    Estimated Creatinine Clearance: 14.8 mL/min (A) (by C-G formula based on SCr of 3.68 mg/dL (H)). Liver & Pancreas: Recent Labs  Lab 01/21/24 0817 01/22/24 0204  AST 10*  --   ALT 7  --   ALKPHOS 45  --   BILITOT 0.2  --   PROT 6.2*  --   ALBUMIN 2.6* 2.6*   No results for input(s): LIPASE, AMYLASE in the last 168 hours. No results for input(s): AMMONIA in the last 168 hours. Diabetic: No results for input(s): HGBA1C in the last 72 hours.  Recent Labs  Lab 01/22/24 0817 01/22/24 1231 01/22/24 1620 01/22/24 2111 01/23/24 0810  GLUCAP 141*  160* 121* 260* 118*   Cardiac Enzymes: No results for input(s): CKTOTAL, CKMB, CKMBINDEX, TROPONINI in the last 168 hours. No results for input(s): PROBNP in the last 8760 hours. Coagulation Profile: No results for input(s): INR, PROTIME in the last 168 hours. Thyroid  Function Tests: No results for input(s): TSH, T4TOTAL, FREET4, T3FREE, THYROIDAB in the last 72 hours. Lipid Profile: No results for input(s): CHOL, HDL, LDLCALC, TRIG, CHOLHDL, LDLDIRECT in the last 72 hours. Anemia Panel: No results for input(s): VITAMINB12, FOLATE, FERRITIN, TIBC, IRON, RETICCTPCT in the last 72 hours. Urine analysis:    Component Value Date/Time   COLORURINE YELLOW 01/20/2024 1233  APPEARANCEUR CLOUDY (A) 01/20/2024 1233   LABSPEC 1.010 01/20/2024 1233   PHURINE 6.0 01/20/2024 1233   GLUCOSEU 50 (A) 01/20/2024 1233   HGBUR MODERATE (A) 01/20/2024 1233   HGBUR trace-lysed 06/16/2010 0833   BILIRUBINUR NEGATIVE 01/20/2024 1233   BILIRUBINUR n 05/22/2015 0927   KETONESUR NEGATIVE 01/20/2024 1233   PROTEINUR 100 (A) 01/20/2024 1233   UROBILINOGEN 0.2 05/22/2015 0927   UROBILINOGEN 0.2 07/31/2014 2130   NITRITE NEGATIVE 01/20/2024 1233   LEUKOCYTESUR LARGE (A) 01/20/2024 1233   Sepsis Labs: Invalid input(s): PROCALCITONIN, LACTICIDVEN  Microbiology: Recent Results (from the past 240 hours)  Urine Culture     Status: Abnormal   Collection Time: 01/20/24 12:33 PM   Specimen: Urine, Random  Result Value Ref Range Status   Specimen Description URINE, RANDOM  Final   Special Requests   Final    URINE, CLEAN CATCH Performed at [facility], [address] [place], [place] [address]    Culture (A)  Final    >=100,000 COLONIES/mL KLEBSIELLA PNEUMONIAE 10,000 COLONIES/mL PROTEUS MIRABILIS    Report Status 01/22/2024 FINAL  Final   Organism ID, Bacteria KLEBSIELLA PNEUMONIAE (A)  Final   Organism ID, Bacteria PROTEUS MIRABILIS (A)   Final      Susceptibility   Klebsiella pneumoniae - MIC*    AMPICILLIN >=32 RESISTANT Resistant     CEFAZOLIN (URINE) Value in next row Sensitive      2 SENSITIVEThis is a modified FDA-approved test that has been validated and its performance characteristics determined by the reporting laboratory.  This laboratory is certified under the Clinical Laboratory Improvement Amendments CLIA as qualified to perform high complexity clinical laboratory testing.    CEFEPIME  Value in next row Sensitive      2 SENSITIVEThis is a modified FDA-approved test that has been validated and its performance characteristics determined by the reporting laboratory.  This laboratory is certified under the Clinical Laboratory Improvement Amendments CLIA as qualified to perform high complexity clinical laboratory testing.    ERTAPENEM Value in next row Sensitive      2 SENSITIVEThis is a modified FDA-approved test that has been validated and its performance characteristics determined by the reporting laboratory.  This laboratory is certified under the Clinical Laboratory Improvement Amendments CLIA as qualified to perform high complexity clinical laboratory testing.    CEFTRIAXONE  Value in next row Sensitive      2 SENSITIVEThis is a modified FDA-approved test that has been validated and its performance characteristics determined by the reporting laboratory.  This laboratory is certified under the Clinical Laboratory Improvement Amendments CLIA as qualified to perform high complexity clinical laboratory testing.    CIPROFLOXACIN Value in next row Sensitive      2 SENSITIVEThis is a modified FDA-approved test that has been validated and its performance characteristics determined by the reporting laboratory.  This laboratory is certified under the Clinical Laboratory Improvement Amendments CLIA as qualified to perform high complexity clinical laboratory testing.    GENTAMICIN  Value in next row Sensitive      2 SENSITIVEThis is a  modified FDA-approved test that has been validated and its performance characteristics determined by the reporting laboratory.  This laboratory is certified under the Clinical Laboratory Improvement Amendments CLIA as qualified to perform high complexity clinical laboratory testing.    NITROFURANTOIN Value in next row Intermediate      2 SENSITIVEThis is a modified FDA-approved test that has been validated and its performance characteristics determined by the reporting laboratory.  This laboratory is certified under the Clinical Laboratory Improvement Amendments CLIA as qualified to perform high complexity clinical laboratory testing.    TRIMETH/SULFA Value in next row Sensitive      2 SENSITIVEThis is a modified FDA-approved test that has been validated and its performance characteristics determined by the reporting laboratory.  This laboratory is certified under the Clinical Laboratory Improvement Amendments CLIA as qualified to perform high complexity clinical laboratory testing.    AMPICILLIN/SULBACTAM Value in next row Sensitive      2 SENSITIVEThis is a modified FDA-approved test that has been validated and its performance characteristics determined by the reporting laboratory.  This laboratory is certified under the Clinical Laboratory Improvement Amendments CLIA as qualified to perform high complexity clinical laboratory testing.    PIP/TAZO Value in next row Sensitive ug/mL     <=4 SENSITIVEThis is a modified FDA-approved test that has been validated and its performance characteristics determined by the reporting laboratory.  This laboratory is certified under the Clinical Laboratory Improvement Amendments CLIA as qualified to perform high complexity clinical laboratory testing.    MEROPENEM Value in next row Sensitive      <=4 SENSITIVEThis is a modified FDA-approved test that has been validated and its performance characteristics determined by the reporting laboratory.  This laboratory is  certified under the Clinical Laboratory Improvement Amendments CLIA as qualified to perform high complexity clinical laboratory testing.    * >=100,000 COLONIES/mL KLEBSIELLA PNEUMONIAE   Proteus mirabilis - MIC*    AMPICILLIN Value in next row Sensitive      <=4 SENSITIVEThis is a modified FDA-approved test that has been validated and its performance characteristics determined by the reporting laboratory.  This laboratory is certified under the Clinical Laboratory Improvement Amendments CLIA as qualified to perform high complexity clinical laboratory testing.    CEFAZOLIN (URINE) Value in next row Sensitive      4 SENSITIVEThis is a modified FDA-approved test that has been validated and its performance characteristics determined by the reporting laboratory.  This laboratory is certified under the Clinical Laboratory Improvement Amendments CLIA as qualified to perform high complexity clinical laboratory testing.    CEFEPIME  Value in next row Sensitive      4 SENSITIVEThis is a modified FDA-approved test that has been validated and its performance characteristics determined by the reporting laboratory.  This laboratory is certified under the Clinical Laboratory Improvement Amendments CLIA as qualified to perform high complexity clinical laboratory testing.    ERTAPENEM Value in next row Sensitive      4 SENSITIVEThis is a modified FDA-approved test that has been validated and its performance characteristics determined by the reporting laboratory.  This laboratory is certified under the Clinical Laboratory Improvement Amendments CLIA as qualified to perform high complexity clinical laboratory testing.    CEFTRIAXONE  Value in next row Sensitive      4 SENSITIVEThis is a modified FDA-approved test that has been validated and its performance characteristics determined by the reporting laboratory.  This laboratory is certified under the Clinical Laboratory Improvement Amendments CLIA as qualified to perform  high complexity clinical laboratory testing.    CIPROFLOXACIN Value in next row Sensitive      4 SENSITIVEThis is a modified FDA-approved test that has been validated and its performance characteristics determined by the reporting laboratory.  This laboratory is certified under the Clinical Laboratory Improvement Amendments CLIA as qualified to perform high complexity clinical laboratory testing.    GENTAMICIN  Value in next row Sensitive  4 SENSITIVEThis is a modified FDA-approved test that has been validated and its performance characteristics determined by the reporting laboratory.  This laboratory is certified under the Clinical Laboratory Improvement Amendments CLIA as qualified to perform high complexity clinical laboratory testing.    NITROFURANTOIN Value in next row Resistant      4 SENSITIVEThis is a modified FDA-approved test that has been validated and its performance characteristics determined by the reporting laboratory.  This laboratory is certified under the Clinical Laboratory Improvement Amendments CLIA as qualified to perform high complexity clinical laboratory testing.    TRIMETH/SULFA Value in next row Sensitive      4 SENSITIVEThis is a modified FDA-approved test that has been validated and its performance characteristics determined by the reporting laboratory.  This laboratory is certified under the Clinical Laboratory Improvement Amendments CLIA as qualified to perform high complexity clinical laboratory testing.    AMPICILLIN/SULBACTAM Value in next row Sensitive      4 SENSITIVEThis is a modified FDA-approved test that has been validated and its performance characteristics determined by the reporting laboratory.  This laboratory is certified under the Clinical Laboratory Improvement Amendments CLIA as qualified to perform high complexity clinical laboratory testing.    PIP/TAZO Value in next row Sensitive ug/mL     <=4 SENSITIVEThis is a modified FDA-approved test that has  been validated and its performance characteristics determined by the reporting laboratory.  This laboratory is certified under the Clinical Laboratory Improvement Amendments CLIA as qualified to perform high complexity clinical laboratory testing.    MEROPENEM Value in next row Sensitive      <=4 SENSITIVEThis is a modified FDA-approved test that has been validated and its performance characteristics determined by the reporting laboratory.  This laboratory is certified under the Clinical Laboratory Improvement Amendments CLIA as qualified to perform high complexity clinical laboratory testing.    * 10,000 COLONIES/mL PROTEUS MIRABILIS    Radiology Studies: No results found.   Scheduled Meds:  amLODipine   2.5 mg Oral Daily   apixaban   5 mg Oral BID   atorvastatin   40 mg Oral Daily   calcitRIOL   0.25 mcg Oral Q M,W,F   carvedilol   12.5 mg Oral BID WC   cefadroxil   500 mg Oral Daily   famotidine   20 mg Oral Daily   insulin  aspart  0-9 Units Subcutaneous TID WC   insulin  glargine  5 Units Subcutaneous Q2200   polyethylene glycol  34 g Oral Daily   predniSONE   5 mg Oral Q breakfast   senna-docusate  2 tablet Oral Once   tacrolimus   3 mg Oral BID   Continuous Infusions:     LOS: 5 days   35 minutes with more than 50% spent in reviewing records, counseling patient/family and coordinating care.  Reyes VEAR Gaw, MD [facility] [email or web address] 01/23/2024, 11:23 AM

## 2024-01-23 NOTE — Plan of Care (Signed)
 Problem: Education: Goal: Knowledge of General Education information will improve Description: Including pain rating scale, medication(s)/side effects and non-pharmacologic comfort measures 01/23/2024 1635 by Jacquelyn Heather RAMAN, RN Outcome: Adequate for Discharge 01/23/2024 1101 by Jacquelyn Heather RAMAN, RN Outcome: Progressing   Problem: Health Behavior/Discharge Planning: Goal: Ability to manage health-related needs will improve 01/23/2024 1635 by Jacquelyn Heather RAMAN, RN Outcome: Adequate for Discharge 01/23/2024 1101 by Jacquelyn Heather RAMAN, RN Outcome: Progressing   Problem: Clinical Measurements: Goal: Ability to maintain clinical measurements within normal limits will improve 01/23/2024 1635 by Jacquelyn Heather RAMAN, RN Outcome: Adequate for Discharge 01/23/2024 1101 by Jacquelyn Heather RAMAN, RN Outcome: Progressing Goal: Will remain free from infection 01/23/2024 1635 by Jacquelyn Heather RAMAN, RN Outcome: Adequate for Discharge 01/23/2024 1101 by Jacquelyn Heather RAMAN, RN Outcome: Progressing Goal: Diagnostic test results will improve 01/23/2024 1635 by Jacquelyn Heather RAMAN, RN Outcome: Adequate for Discharge 01/23/2024 1101 by Jacquelyn Heather RAMAN, RN Outcome: Progressing Goal: Respiratory complications will improve 01/23/2024 1635 by Jacquelyn Heather RAMAN, RN Outcome: Adequate for Discharge 01/23/2024 1101 by Jacquelyn Heather RAMAN, RN Outcome: Progressing Goal: Cardiovascular complication will be avoided 01/23/2024 1635 by Jacquelyn Heather RAMAN, RN Outcome: Adequate for Discharge 01/23/2024 1101 by Jacquelyn Heather RAMAN, RN Outcome: Progressing   Problem: Activity: Goal: Risk for activity intolerance will decrease 01/23/2024 1635 by Jacquelyn Heather RAMAN, RN Outcome: Adequate for Discharge 01/23/2024 1101 by Jacquelyn Heather RAMAN, RN Outcome: Progressing   Problem: Nutrition: Goal: Adequate nutrition will be maintained 01/23/2024 1635 by  Jacquelyn Heather RAMAN, RN Outcome: Adequate for Discharge 01/23/2024 1101 by Jacquelyn Heather RAMAN, RN Outcome: Progressing   Problem: Coping: Goal: Level of anxiety will decrease 01/23/2024 1635 by Jacquelyn Heather RAMAN, RN Outcome: Adequate for Discharge 01/23/2024 1101 by Jacquelyn Heather RAMAN, RN Outcome: Progressing   Problem: Elimination: Goal: Will not experience complications related to bowel motility 01/23/2024 1635 by Jacquelyn Heather RAMAN, RN Outcome: Adequate for Discharge 01/23/2024 1101 by Jacquelyn Heather RAMAN, RN Outcome: Progressing Goal: Will not experience complications related to urinary retention 01/23/2024 1635 by Jacquelyn Heather RAMAN, RN Outcome: Adequate for Discharge 01/23/2024 1101 by Jacquelyn Heather RAMAN, RN Outcome: Progressing   Problem: Pain Managment: Goal: General experience of comfort will improve and/or be controlled 01/23/2024 1635 by Jacquelyn Heather RAMAN, RN Outcome: Adequate for Discharge 01/23/2024 1101 by Jacquelyn Heather RAMAN, RN Outcome: Progressing   Problem: Safety: Goal: Ability to remain free from injury will improve 01/23/2024 1635 by Jacquelyn Heather RAMAN, RN Outcome: Adequate for Discharge 01/23/2024 1101 by Jacquelyn Heather RAMAN, RN Outcome: Progressing   Problem: Skin Integrity: Goal: Risk for impaired skin integrity will decrease 01/23/2024 1635 by Jacquelyn Heather RAMAN, RN Outcome: Adequate for Discharge 01/23/2024 1101 by Jacquelyn Heather RAMAN, RN Outcome: Progressing   Problem: Education: Goal: Ability to describe self-care measures that may prevent or decrease complications (Diabetes Survival Skills Education) will improve 01/23/2024 1635 by Jacquelyn Heather RAMAN, RN Outcome: Adequate for Discharge 01/23/2024 1101 by Jacquelyn Heather RAMAN, RN Outcome: Progressing Goal: Individualized Educational Video(s) 01/23/2024 1635 by Jacquelyn Heather RAMAN, RN Outcome: Adequate for Discharge 01/23/2024 1101 by  Jacquelyn Heather RAMAN, RN Outcome: Progressing   Problem: Coping: Goal: Ability to adjust to condition or change in health will improve 01/23/2024 1635 by Jacquelyn Heather RAMAN, RN Outcome: Adequate for Discharge 01/23/2024 1101 by Jacquelyn Heather RAMAN, RN Outcome: Progressing   Problem: Fluid Volume: Goal: Ability to maintain a balanced intake and output will improve 01/23/2024 1635 by Jacquelyn Heather RAMAN, RN Outcome: Adequate for Discharge 01/23/2024  1101 by Jacquelyn Heather RAMAN, RN Outcome: Progressing   Problem: Health Behavior/Discharge Planning: Goal: Ability to identify and utilize available resources and services will improve 01/23/2024 1635 by Jacquelyn Heather RAMAN, RN Outcome: Adequate for Discharge 01/23/2024 1101 by Jacquelyn Heather RAMAN, RN Outcome: Progressing Goal: Ability to manage health-related needs will improve 01/23/2024 1635 by Jacquelyn Heather RAMAN, RN Outcome: Adequate for Discharge 01/23/2024 1101 by Jacquelyn Heather RAMAN, RN Outcome: Progressing   Problem: Metabolic: Goal: Ability to maintain appropriate glucose levels will improve 01/23/2024 1635 by Jacquelyn Heather RAMAN, RN Outcome: Adequate for Discharge 01/23/2024 1101 by Jacquelyn Heather RAMAN, RN Outcome: Progressing   Problem: Nutritional: Goal: Maintenance of adequate nutrition will improve 01/23/2024 1635 by Jacquelyn Heather RAMAN, RN Outcome: Adequate for Discharge 01/23/2024 1101 by Jacquelyn Heather RAMAN, RN Outcome: Progressing Goal: Progress toward achieving an optimal weight will improve 01/23/2024 1635 by Jacquelyn Heather RAMAN, RN Outcome: Adequate for Discharge 01/23/2024 1101 by Jacquelyn Heather RAMAN, RN Outcome: Progressing   Problem: Skin Integrity: Goal: Risk for impaired skin integrity will decrease 01/23/2024 1635 by Jacquelyn Heather RAMAN, RN Outcome: Adequate for Discharge 01/23/2024 1101 by Jacquelyn Heather RAMAN, RN Outcome: Progressing   Problem: Tissue  Perfusion: Goal: Adequacy of tissue perfusion will improve 01/23/2024 1635 by Jacquelyn Heather RAMAN, RN Outcome: Adequate for Discharge 01/23/2024 1101 by Jacquelyn Heather RAMAN, RN Outcome: Progressing

## 2024-01-23 NOTE — Discharge Summary (Signed)
 Physician Discharge Summary   Patient: [name] [name] MRN: [id number] DOB: June 16, 1950  Admit date:     01/18/2024  Discharge date: 01/23/24  Discharge Physician: Reyes VEAR Gaw   PCP: Theophilus Andrews, Tully GRADE, MD   Recommendations at discharge:   Patient discharged home with 2 days worth of cefadroxil  for urinary tract infection. Recommended follow-up with PCP in 1 week and nephrology in 2 weeks.  Nephrology plans to make appointment for patient. He was also switched from Crestor  to Lipitor due to renal function on recommendations by pharmacy.  Discharge Diagnoses: Principal Problem:   AKI (acute kidney injury) ([name]) Active Problems:   Pulmonary embolism ([name])   Benign prostatic hyperplasia without lower urinary tract symptoms   CKD (chronic kidney disease), stage IV ([name])   Essential hypertension   Other hyperlipidemia   Elevated troponin   Immunosuppressed status ([name])   Kidney transplanted   AMS (altered mental status)   Type 2 diabetes mellitus ([name])  Resolved Problems:   * No resolved hospital problems. [facility] Course: [name] [name] is a 73 y.o. male with medical history significant of  renal transplant on immunosuppression therapy, PE on Eliquis , HTN, HLD, DM2 p/w AKI on CKD p/w confusion at home.  The patient reported feeling disoriented, possibly due to low lighting, and mistakenly entered another room. While attempting to find the bed in what the patient believed to be their own room, the patient fell after slipping off a bed and landing on plastic bags. The patient remembered the fall and did not lose consciousness. The patient's wife activated EMS, who noted low blood pressure upon arrival.    In the ED, pt hypotensive and had low BP per EMS as well. Labs notable for BUN/Cr 69/4.39 (baseline Cr 2.7-3), and troponins 29-->35. EDP requested admission for AKI on CKD.  Admitted and started on IV fluids.  Nephrology was consulted.  AKI deemed most likely  secondary to hemodynamic insults in the context of relative hypotension.  Per renal, outpatient GFR<20, last known creatinine outpatient 3.9 on 8/18.  He had a renal transplant ultrasound while in house which was unrevealing.  Patient continued to improve with IV fluids and supportive care.  He was switched to oral hydration on day prior to discharge.  His renal function continued to improve even on oral hydration and because of his continued improvement he was able to be discharged home on 01/23/2024.   Assessment & Plan:  Principal Problem:   AKI (acute kidney injury) ([name]) Active Problems:   Pulmonary embolism ([name])   Benign prostatic hyperplasia without lower urinary tract symptoms   CKD (chronic kidney disease), stage IV ([name])   Essential hypertension   Other hyperlipidemia   Elevated troponin   Immunosuppressed status ([name])   Kidney transplanted   AMS (altered mental status)   Type 2 diabetes mellitus ([name])   AKI on CKD stage 4 - History of renal transplant - Baseline GFR <20 per nephrology, last outpt Cr 3.9 on 8/18.   - appreciate nephrology input --> Cr trending back towards baseline.    - Nephrology signed off 9/6.  Creatinine continued to improve today on just oral hydration.   - This was main hospital issue, now resolved.     Acute cystitis: No clear uti symptoms but daughter reports prior episodes of aki with uti. Urinalysis from 9/2 does appear infected. Nephrology favors treating - Urine culture now growing both Proteus and Klebsiella.  Sensitive to cefazolin, starting p.o. cefadroxil  for  total 5 days abx. - Stop date 9/8.  Daily rather than twice daily b/c of CrCl   Acute metabolic encephalopathy - Transient, resolved upon presentation to ED.  Now at baseline which includes some mild confusion. CT head neagive   Status post renal transplant - Tacrolimus , prednisone  - appreciate renal input.     Ambulatory dysfunction - PT consult-->recommending HHPT at DC    History of PE - Eliquis    Hypertension Bp controlled - Amlodipine , Coreg    Hyperlipidemia - Crestor  --> Lipitor due to renal function rec by pharmacy    Diabetes mellitus - Hemoglobin A1c 8.3 - CBGs acceptable here.  - Sliding scale insulin    Demand ischemia - Troponin 29, 35. asymptomatic       Consultants: Nephrology Procedures performed: None Disposition: Home Diet recommendation:  Discharge Diet Orders (From admission, onward)     Start     Ordered   01/23/24 0000  Diet - low sodium heart healthy        01/23/24 1434           Renal diet DISCHARGE MEDICATION: Allergies as of 01/23/2024       Reactions   Other Rash   Oxyquinoline-white Pet-lanolin   Aquamed Rash   Bag Balm  [albolene] Rash   Cold Cream Rash        Medication List     STOP taking these medications    rosuvastatin  20 MG tablet Commonly known as: CRESTOR    Vitamin D  (Ergocalciferol ) 1.25 MG (50000 UNIT) Caps capsule Commonly known as: DRISDOL        TAKE these medications    amLODipine  2.5 MG tablet Commonly known as: NORVASC  Take 2.5 mg by mouth daily.   apixaban  5 MG Tabs tablet Commonly known as: ELIQUIS  Take 5 mg by mouth 2 (two) times daily.   atorvastatin  40 MG tablet Commonly known as: LIPITOR Take 1 tablet (40 mg total) by mouth daily. Start taking on: January 24, 2024   Baqsimi One Pack 3 MG/DOSE Powd Generic drug: Glucagon Place 1 spray into the nose daily as needed for low blood sugar.   calcitRIOL  0.25 MCG capsule Commonly known as: ROCALTROL  Take 0.25 mcg by mouth every Monday, Wednesday, and Friday.   carvedilol  12.5 MG tablet Commonly known as: COREG  Take 12.5 mg by mouth 2 (two) times daily with a meal.   cefadroxil  500 MG capsule Commonly known as: DURICEF Take 1 capsule (500 mg total) by mouth daily. X 2 more days Start taking on: January 24, 2024   famotidine  20 MG tablet Commonly known as: PEPCID  Take 20 mg by mouth daily.    folic acid  1 MG tablet Commonly known as: FOLVITE  Take 1 mg by mouth daily.   furosemide  40 MG tablet Commonly known as: LASIX  Take 40 mg by mouth daily.   HumaLOG KwikPen 100 UNIT/ML KwikPen Generic drug: insulin  lispro Inject 0-15 Units into the skin 3 (three) times daily as needed (high blood sugar).   Lantus  SoloStar 100 UNIT/ML Solostar Pen Generic drug: insulin  glargine Inject 5 Units into the skin at bedtime.   Melatonin 10 MG Tabs Take 10 mg by mouth at bedtime.   predniSONE  5 MG tablet Commonly known as: DELTASONE  Take 5 mg by mouth daily with breakfast.   Semaglutide(0.25 or 0.5MG /DOS) 2 MG/3ML Sopn Inject 0.5 mg into the skin once a week.   sevelamer  carbonate 800 MG tablet Commonly known as: RENVELA    tacrolimus  1 MG capsule Commonly known as: PROGRAF  Take 3  mg by mouth 2 (two) times daily.   thiamine  100 MG tablet Commonly known as: VITAMIN B1 Take 100 mg by mouth daily.        Follow-up Information     [facility] Follow up.   Specialty: Home Health Services Why: [facility] home health will provide home health services.  They will call you in the next 24-48 hours to set up services. Contact information: [address] [place] [address] [phone]                Discharge Exam: Fredricka Weights   01/18/24 0537 01/18/24 1042 01/23/24 0345  Weight: 59 kg 57.4 kg 60.4 kg   Gen: 73 y.o. male in no apparent distress.  Nontoxic Pulm: Non-labored breathing.  Clear to auscultation bilaterally.  CV: Regular rate and rhythm. No murmur, rub, or gallop. No JVD GI: Abdomen soft, non-tender, non-distended.  Hypoactive bowel sounds.  Ext: Warm, no deformities Skin: No rashes, lesions  Neuro: Alert and oriented to person and place.  No obvious focal neurological deficits. Psych: Calm  Judgement and insight appear normal. Mood & affect appropriate.   Condition at discharge: good  The results of significant  diagnostics from this hospitalization (including imaging, microbiology, ancillary and laboratory) are listed below for reference.   Imaging Studies: DG Abd 1 View Result Date: 01/23/2024 CLINICAL DATA:  Abdominal distension. EXAM: ABDOMEN - 1 VIEW COMPARISON:  None Available. FINDINGS: The bowel gas pattern is normal. No radio-opaque calculi or other significant radiographic abnormality are seen. IMPRESSION: Negative. Electronically Signed   By: Lynwood Landy Raddle M.D.   On: 01/23/2024 13:43   US  Renal Transplant w/Doppler Result Date: 01/20/2024 CLINICAL DATA:  Acute kidney injury EXAM: ULTRASOUND OF RENAL TRANSPLANT WITH RENAL DOPPLER ULTRASOUND TECHNIQUE: Ultrasound examination of the renal transplant was performed with gray-scale, color and duplex doppler evaluation. COMPARISON:  None Available. FINDINGS: Transplant kidney location: RLQ Transplant Kidney: Renal measurements: 12.9 x 5.6 x 5.8 cm = volume: . Normal in size and parenchymal echogenicity. No evidence of mass or hydronephrosis. No peri-transplant fluid collection seen. Color flow in the main renal artery:  Yes Color flow in the main renal vein:  Yes Duplex Doppler Evaluation: Main Renal Artery Velocity: Not recorded Main Renal Artery Resistive Index: Not recorded Venous waveform in main renal vein:  Present Intrarenal resistive index in upper pole:  0.81 (normal 0.6-0.8; equivocal 0.8-0.9; abnormal >= 0.9) Intrarenal resistive index in lower pole: 0.79 (normal 0.6-0.8; equivocal 0.8-0.9; abnormal >= 0.9) Bladder: Normal for degree of bladder distention. Other findings:  None. IMPRESSION: 1. No hydronephrosis. 2. Resistive indices in the upper and lower pole are normal/equivocal. 3. No peri-transplant fluid collection. Electronically Signed   By: Greig Pique M.D.   On: 01/20/2024 22:06   CT Cervical Spine Wo Contrast Result Date: 01/18/2024 CLINICAL DATA:  Fall, syncope EXAM: CT CERVICAL SPINE WITHOUT CONTRAST TECHNIQUE: Multidetector CT  imaging of the cervical spine was performed without intravenous contrast. Multiplanar CT image reconstructions were also generated. RADIATION DOSE REDUCTION: This exam was performed according to the departmental dose-optimization program which includes automated exposure control, adjustment of the mA and/or kV according to patient size and/or use of iterative reconstruction technique. COMPARISON:  Prior CT scan of the cervical spine 01/07/2022 FINDINGS: Alignment: No focal malalignment. Loss of the normal cervical lordosis is again noted with focal anterolisthesis of C4 on C5 measuring approximately 2 mm and retrolisthesis of C6 on C7 measuring approximately 4  mm. This is unchanged compared to prior. Skull base and vertebrae: No acute fracture. No primary bone lesion or focal pathologic process. Soft tissues and spinal canal: No prevertebral fluid or swelling. No visible canal hematoma. Disc levels: Multilevel cervical spondylosis with degenerative disc changes at C5-C6, C6-C7 and C7-T1. Upper chest: Negative. Other: None. IMPRESSION: 1. No evidence of acute fracture or malalignment. 2. Similar appearance of multilevel cervical spondylosis most significant at C5-C6, C6-C7 and C7-T1. 3. As before, there is focal retrolisthesis of the C5-C6 segment resulting in anterolisthesis of C4 on C5 and retrolisthesis of C6 on C7. This also results in loss of the normal cervical lordosis. Findings are almost certainly chronic and degenerative in nature given stability over time. Electronically Signed   By: Wilkie Lent M.D.   On: 01/18/2024 07:23   CT Head Wo Contrast Result Date: 01/18/2024 CLINICAL DATA:  Status post fall.  Syncope. EXAM: CT HEAD WITHOUT CONTRAST CT CERVICAL SPINE WITHOUT CONTRAST TECHNIQUE: Multidetector CT imaging of the head and cervical spine was performed following the standard protocol without intravenous contrast. Multiplanar CT image reconstructions of the cervical spine were also generated.  RADIATION DOSE REDUCTION: This exam was performed according to the departmental dose-optimization program which includes automated exposure control, adjustment of the mA and/or kV according to patient size and/or use of iterative reconstruction technique. COMPARISON:  01/13/2022 FINDINGS: CT HEAD FINDINGS Brain: There is no evidence for acute hemorrhage, hydrocephalus, mass lesion, or abnormal extra-axial fluid collection. No definite CT evidence for acute infarction. Patchy low attenuation in the deep hemispheric and periventricular white matter is nonspecific, but likely reflects chronic microvascular ischemic demyelination. Vascular: No hyperdense vessel or unexpected calcification. Skull: No evidence for fracture. No worrisome lytic or sclerotic lesion. Sinuses/Orbits: The visualized paranasal sinuses and mastoid air cells are clear. Visualized portions of the globes and intraorbital fat are unremarkable. Other: None. IMPRESSION: 1. No acute intracranial abnormality. 2. Chronic small vessel ischemic disease. Electronically Signed   By: Camellia Candle M.D.   On: 01/18/2024 06:55   DG Chest Port 1 View Result Date: 01/18/2024 CLINICAL DATA:  Syncope. EXAM: PORTABLE CHEST 1 VIEW COMPARISON:  01/13/2022 FINDINGS: The lungs are clear without focal pneumonia, edema, pneumothorax or pleural effusion. The cardiopericardial silhouette is within normal limits for size. No acute bony abnormality. Telemetry leads overlie the chest. IMPRESSION: No active disease. Electronically Signed   By: Camellia Candle M.D.   On: 01/18/2024 06:17    Microbiology: Results for orders placed or performed during the hospital encounter of 01/18/24  Urine Culture     Status: Abnormal   Collection Time: 01/20/24 12:33 PM   Specimen: Urine, Random  Result Value Ref Range Status   Specimen Description URINE, RANDOM  Final   Special Requests   Final    URINE, CLEAN CATCH Performed at [facility], [address] [place],  [place] [address]    Culture (A)  Final    >=100,000 COLONIES/mL KLEBSIELLA PNEUMONIAE 10,000 COLONIES/mL PROTEUS MIRABILIS    Report Status 01/22/2024 FINAL  Final   Organism ID, Bacteria KLEBSIELLA PNEUMONIAE (A)  Final   Organism ID, Bacteria PROTEUS MIRABILIS (A)  Final      Susceptibility   Klebsiella pneumoniae - MIC*    AMPICILLIN >=32 RESISTANT Resistant     CEFAZOLIN (URINE) Value in next row Sensitive      2 SENSITIVEThis is a modified FDA-approved test that has been validated and its performance characteristics determined by the reporting laboratory.  This laboratory is certified under the Clinical Laboratory Improvement Amendments CLIA as qualified to perform high complexity clinical laboratory testing.    CEFEPIME  Value in next row Sensitive      2 SENSITIVEThis is a modified FDA-approved test that has been validated and its performance characteristics determined by the reporting laboratory.  This laboratory is certified under the Clinical Laboratory Improvement Amendments CLIA as qualified to perform high complexity clinical laboratory testing.    ERTAPENEM Value in next row Sensitive      2 SENSITIVEThis is a modified FDA-approved test that has been validated and its performance characteristics determined by the reporting laboratory.  This laboratory is certified under the Clinical Laboratory Improvement Amendments CLIA as qualified to perform high complexity clinical laboratory testing.    CEFTRIAXONE  Value in next row Sensitive      2 SENSITIVEThis is a modified FDA-approved test that has been validated and its performance characteristics determined by the reporting laboratory.  This laboratory is certified under the Clinical Laboratory Improvement Amendments CLIA as qualified to perform high complexity clinical laboratory testing.    CIPROFLOXACIN Value in next row Sensitive      2 SENSITIVEThis is a modified FDA-approved test that has been validated and its performance characteristics  determined by the reporting laboratory.  This laboratory is certified under the Clinical Laboratory Improvement Amendments CLIA as qualified to perform high complexity clinical laboratory testing.    GENTAMICIN  Value in next row Sensitive      2 SENSITIVEThis is a modified FDA-approved test that has been validated and its performance characteristics determined by the reporting laboratory.  This laboratory is certified under the Clinical Laboratory Improvement Amendments CLIA as qualified to perform high complexity clinical laboratory testing.    NITROFURANTOIN Value in next row Intermediate      2 SENSITIVEThis is a modified FDA-approved test that has been validated and its performance characteristics determined by the reporting laboratory.  This laboratory is certified under the Clinical Laboratory Improvement Amendments CLIA as qualified to perform high complexity clinical laboratory testing.    TRIMETH/SULFA Value in next row Sensitive      2 SENSITIVEThis is a modified FDA-approved test that has been validated and its performance characteristics determined by the reporting laboratory.  This laboratory is certified under the Clinical Laboratory Improvement Amendments CLIA as qualified to perform high complexity clinical laboratory testing.    AMPICILLIN/SULBACTAM Value in next row Sensitive      2 SENSITIVEThis is a modified FDA-approved test that has been validated and its performance characteristics determined by the reporting laboratory.  This laboratory is certified under the Clinical Laboratory Improvement Amendments CLIA as qualified to perform high complexity clinical laboratory testing.    PIP/TAZO Value in next row Sensitive ug/mL     <=4 SENSITIVEThis is a modified FDA-approved test that has been validated and its performance characteristics determined by the reporting laboratory.  This laboratory is certified under the Clinical Laboratory Improvement Amendments CLIA as qualified to perform  high complexity clinical laboratory testing.    MEROPENEM Value in next row Sensitive      <=4 SENSITIVEThis is a modified FDA-approved test that has been validated and its performance characteristics determined by the reporting laboratory.  This laboratory is certified under the Clinical Laboratory Improvement Amendments CLIA as qualified to perform high complexity clinical laboratory testing.    * >=100,000 COLONIES/mL KLEBSIELLA PNEUMONIAE   Proteus mirabilis - MIC*    AMPICILLIN Value in next row Sensitive      <=  4 SENSITIVEThis is a modified FDA-approved test that has been validated and its performance characteristics determined by the reporting laboratory.  This laboratory is certified under the Clinical Laboratory Improvement Amendments CLIA as qualified to perform high complexity clinical laboratory testing.    CEFAZOLIN (URINE) Value in next row Sensitive      4 SENSITIVEThis is a modified FDA-approved test that has been validated and its performance characteristics determined by the reporting laboratory.  This laboratory is certified under the Clinical Laboratory Improvement Amendments CLIA as qualified to perform high complexity clinical laboratory testing.    CEFEPIME  Value in next row Sensitive      4 SENSITIVEThis is a modified FDA-approved test that has been validated and its performance characteristics determined by the reporting laboratory.  This laboratory is certified under the Clinical Laboratory Improvement Amendments CLIA as qualified to perform high complexity clinical laboratory testing.    ERTAPENEM Value in next row Sensitive      4 SENSITIVEThis is a modified FDA-approved test that has been validated and its performance characteristics determined by the reporting laboratory.  This laboratory is certified under the Clinical Laboratory Improvement Amendments CLIA as qualified to perform high complexity clinical laboratory testing.    CEFTRIAXONE  Value in next row Sensitive       4 SENSITIVEThis is a modified FDA-approved test that has been validated and its performance characteristics determined by the reporting laboratory.  This laboratory is certified under the Clinical Laboratory Improvement Amendments CLIA as qualified to perform high complexity clinical laboratory testing.    CIPROFLOXACIN Value in next row Sensitive      4 SENSITIVEThis is a modified FDA-approved test that has been validated and its performance characteristics determined by the reporting laboratory.  This laboratory is certified under the Clinical Laboratory Improvement Amendments CLIA as qualified to perform high complexity clinical laboratory testing.    GENTAMICIN  Value in next row Sensitive      4 SENSITIVEThis is a modified FDA-approved test that has been validated and its performance characteristics determined by the reporting laboratory.  This laboratory is certified under the Clinical Laboratory Improvement Amendments CLIA as qualified to perform high complexity clinical laboratory testing.    NITROFURANTOIN Value in next row Resistant      4 SENSITIVEThis is a modified FDA-approved test that has been validated and its performance characteristics determined by the reporting laboratory.  This laboratory is certified under the Clinical Laboratory Improvement Amendments CLIA as qualified to perform high complexity clinical laboratory testing.    TRIMETH/SULFA Value in next row Sensitive      4 SENSITIVEThis is a modified FDA-approved test that has been validated and its performance characteristics determined by the reporting laboratory.  This laboratory is certified under the Clinical Laboratory Improvement Amendments CLIA as qualified to perform high complexity clinical laboratory testing.    AMPICILLIN/SULBACTAM Value in next row Sensitive      4 SENSITIVEThis is a modified FDA-approved test that has been validated and its performance characteristics determined by the reporting laboratory.  This  laboratory is certified under the Clinical Laboratory Improvement Amendments CLIA as qualified to perform high complexity clinical laboratory testing.    PIP/TAZO Value in next row Sensitive ug/mL     <=4 SENSITIVEThis is a modified FDA-approved test that has been validated and its performance characteristics determined by the reporting laboratory.  This laboratory is certified under the Clinical Laboratory Improvement Amendments CLIA as qualified to perform high complexity clinical laboratory testing.    MEROPENEM Value in  next row Sensitive      <=4 SENSITIVEThis is a modified FDA-approved test that has been validated and its performance characteristics determined by the reporting laboratory.  This laboratory is certified under the Clinical Laboratory Improvement Amendments CLIA as qualified to perform high complexity clinical laboratory testing.    * 10,000 COLONIES/mL PROTEUS MIRABILIS    Labs: CBC: Recent Labs  Lab 01/18/24 0542 01/19/24 0231 01/21/24 0817 01/22/24 0204 01/23/24 0147  WBC 9.9 8.5 8.1 8.7 7.6  NEUTROABS 7.2  --   --   --   --   HGB 10.2* 9.6* 9.5* 9.6* 9.2*  HCT 32.7* 29.9* 29.3* 30.5* 29.7*  MCV 87.2 84.9 83.7 84.7 85.6  PLT 166 164 210 225 225   Basic Metabolic Panel: Recent Labs  Lab 01/19/24 0231 01/20/24 0215 01/21/24 0817 01/22/24 0204 01/23/24 0147  NA 135 139 137 137 134*  K 3.9 4.4 4.1 4.7 4.6  CL 106 109 109 107 106  CO2 19* 20* 20* 20* 20*  GLUCOSE 301* 237* 134* 232* 199*  BUN 65* 57* 53* 50* 47*  CREATININE 4.27* 4.01* 3.79* 3.79* 3.68*  CALCIUM  8.5* 8.8* 9.1 9.1 9.0  PHOS  --   --   --  2.7  --    Liver Function Tests: Recent Labs  Lab 01/21/24 0817 01/22/24 0204  AST 10*  --   ALT 7  --   ALKPHOS 45  --   BILITOT 0.2  --   PROT 6.2*  --   ALBUMIN 2.6* 2.6*   CBG: Recent Labs  Lab 01/22/24 1231 01/22/24 1620 01/22/24 2111 01/23/24 0810 01/23/24 1153  GLUCAP 160* 121* 260* 118* 215*    Discharge time spent: less than  30 minutes.  Signed: Reyes VEAR Gaw, MD [facility] 01/23/2024

## 2024-01-24 ENCOUNTER — Telehealth: Payer: Self-pay

## 2024-01-24 NOTE — Transitions of Care (Post Inpatient/ED Visit) (Signed)
   01/24/2024  Name: [name] [name] MRN: [id number] DOB: December 28, 1950  Today's TOC FU Call Status: Today's TOC FU Call Status:: Unsuccessful Call (1st Attempt) Unsuccessful Call (1st Attempt) Date: 01/24/24  Attempted to reach the patient regarding the most recent Inpatient/ED visit.  Follow Up Plan: Additional outreach attempts will be made to reach the patient to complete the Transitions of Care (Post Inpatient/ED visit) call.   Alan Ee, RN, BSN, CEN [employer]- Transition of Care Team.  [facility] [phone]

## 2024-01-25 ENCOUNTER — Telehealth: Payer: Self-pay

## 2024-01-25 NOTE — Transitions of Care (Post Inpatient/ED Visit) (Signed)
 01/25/2024  Name: [name] [name] MRN: [id number] DOB: [birth year]  Today's TOC FU Call Status: Today's TOC FU Call Status:: Successful TOC FU Call Completed TOC FU Call Complete Date: 01/25/24 Patient's Name and Date of Birth confirmed.  Transition Care Management Follow-up Telephone Call How have you been since you were released from the hospital?: Same ( [place]) Any questions or concerns?: No  Items Reviewed: Did you receive and understand the discharge instructions provided?: Yes Medications obtained,verified, and reconciled?: Yes (Medications Reviewed) Any new allergies since your discharge?: No Dietary orders reviewed?: Yes Type of Diet Ordered:: low salt heart healthy diet Do you have support at home?: Yes People in Home [RPT]: child(ren), adult, spouse Name of Support/Comfort Primary Source: oldest daughter takes care of medications and is POA.  Tyleen Moody  Medications Reviewed Today: Medications Reviewed Today     Reviewed by Rumalda Alan PENNER, RN (Registered Nurse) on 01/25/24 at 1217  Med List Status: <None>   Medication Order Taking? Sig Documenting Provider Last Dose Status Informant  amLODipine  (NORVASC ) 2.5 MG tablet [id number] Yes Take 2.5 mg by mouth daily. [provider]  Active Child, Pharmacy Records  apixaban  (ELIQUIS ) 5 MG TABS tablet [id number] Yes Take 5 mg by mouth 2 (two) times daily. [provider]  Active Child, Pharmacy Records           Med Note (CRUTHIS, CHLOE C   Tue Jan 18, 2024 12:05 PM) Daughter is unsure of the exact date of last dose.   atorvastatin  (LIPITOR) 40 MG tablet [id number] Yes Take 1 tablet (40 mg total) by mouth daily. Elpidio Reyes DEL, MD  Active   BAQSIMI ONE PACK 3 MG/DOSE POWD [id number]  Place 1 spray into the nose daily as needed for low blood sugar.  Patient not taking: Reported on 01/25/2024   [provider]  Active Child, Pharmacy Records  calcitRIOL  (ROCALTROL ) 0.25 MCG capsule [id number]  Yes Take 0.25 mcg by mouth every Monday, Wednesday, and Friday. [provider]  Active Child, Pharmacy Records  carvedilol  (COREG ) 12.5 MG tablet [id number] Yes Take 12.5 mg by mouth 2 (two) times daily with a meal. [provider]  Active Child, Pharmacy Records  cefadroxil  (DURICEF) 500 MG capsule [id number]  Take 1 capsule (500 mg total) by mouth daily. X 2 more days  Patient not taking: Reported on 01/25/2024   Elpidio Reyes DEL, MD  Active   famotidine  (PEPCID ) 20 MG tablet [id number] Yes Take 20 mg by mouth daily. [provider]  Active Child, Pharmacy Records  folic acid  (FOLVITE ) 1 MG tablet [id number] Yes Take 1 mg by mouth daily. [provider]  Active Child, Pharmacy Records  furosemide  (LASIX ) 40 MG tablet [id number] Yes Take 40 mg by mouth daily. [provider]  Active Child, Pharmacy Records  insulin  glargine (LANTUS  SOLOSTAR) 100 UNIT/ML Solostar Pen [id number] Yes Inject 5 Units into the skin at bedtime. [provider]  Active Child, Pharmacy Records           Med Note (CRUTHIS, CHLOE C   Tue Jan 18, 2024 12:06 PM) Daughter is adamant the pt is still taking this medication. Dispense report does not support this claim.   insulin  lispro (HUMALOG KWIKPEN) 100 UNIT/ML KwikPen [id number] Yes Inject 0-15 Units into the skin 3 (three) times daily as needed (high blood sugar). [provider]  Active Child, Pharmacy Records  Melatonin 10 MG TABS [id number] Yes Take 10 mg by mouth at bedtime. [provider]  Active Child, Pharmacy Records  predniSONE  (DELTASONE ) 5 MG tablet [id number] Yes Take 5 mg by mouth daily with breakfast. [provider]  Active Child, Pharmacy Records  Semaglutide,0.25 or 0.5MG /DOS, 2 MG/3ML SOPN [id number] Yes Inject 0.5 mg into the skin once a week. [provider]  Active Child, Pharmacy Records           Med Note (CRUTHIS, CHLOE C   Tue Jan 18, 2024 12:06 PM) Daughter is adamant the  pt is still taking this medication. Dispense report does not support this claim.   sevelamer  carbonate (RENVELA ) 800 MG tablet [id number] Yes  [provider]  Active Child, Pharmacy Records           Med Note (CRUTHIS, CHLOE C   Tue Jan 18, 2024 12:07 PM) Daughter did not recall this medication. No fill hx found.   tacrolimus  (PROGRAF ) 1 MG capsule [id number] Yes Take 3 mg by mouth 2 (two) times daily. [provider]  Active Child, Pharmacy Records  thiamine  100 MG tablet [id number] Yes Take 100 mg by mouth daily. [provider]  Active Child, Pharmacy Records  Med List Note Lorne, [place], CPhT 01/18/24 1152): Daughter Tyleen handles medications.             Home Care and Equipment/Supplies: Were Home Health Services Ordered?: Yes [facility] Has Agency set up a time to come to your home?: No (patient reports that he does not answer if he does not know number.  Has looked back and he did get a call.) Any new equipment or medical supplies ordered?: No  Functional Questionnaire: Do you need assistance with bathing/showering or dressing?: No Do you need assistance with meal preparation?: Yes Do you need assistance with eating?: No Do you have difficulty maintaining continence: No Do you need assistance with getting out of bed/getting out of a chair/moving?: No Do you have difficulty managing or taking your medications?: Yes (daughter manages medications)  Follow up appointments reviewed: PCP Follow-up appointment confirmed?: No (patient reports that he will call today and schedule.) MD Provider Line Number:[phone] Given: No Specialist Hospital Follow-up appointment confirmed?: No Follow-Up Specialty Provider:: reports several months away but will call today to get an appointment. Reason Specialist Follow-Up Not Confirmed: Patient has Specialist Provider Number and will Call for Appointment Do you need transportation to your  follow-up appointment?: No Do you understand care options if your condition(s) worsen?: Yes-patient verbalized understanding  SDOH Interventions Today    Flowsheet Row Most Recent Value  SDOH Interventions   Food Insecurity Interventions Intervention Not Indicated  Housing Interventions Intervention Not Indicated  Transportation Interventions Intervention Not Indicated  Utilities Interventions Intervention Not Indicated   Patient reports that he is doing well. Lives with wife and daughter.  Reports oldest daughter manages his medications.  Patient  reports that he takes all his medications as prescribed. Confirms that he has completed his antibiotics.  Denies any urinary concerns today. 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 Encouraged patient to call PCP and make an appointment. Confirmed patient has phone number. States he needs to talk to wife about transportation before making an appointment.  Reviewed recommendation of follow up in 1 week. Reviewed importance of follow up with nephrology.  Patient reports that he will call and make an appointment for 2 weeks as suggested by hospital discharge.  Confirmed that patient has gotten a phone call from home health but states that  he does not answer when he does not know number. I provided patient with contact number and he will call home  health today.  Reviewed and offered 30 day TOC program and patient has declined.  Provided my contact information if patient needs to call me back.   Alan Ee, RN, BSN, CEN [employer]- Transition of Care Team.  [facility] [phone]

## 2024-01-26 ENCOUNTER — Telehealth: Payer: Self-pay | Admitting: *Deleted

## 2024-01-26 DIAGNOSIS — M4312 Spondylolisthesis, cervical region: Secondary | ICD-10-CM | POA: Diagnosis not present

## 2024-01-26 DIAGNOSIS — B964 Proteus (mirabilis) (morganii) as the cause of diseases classified elsewhere: Secondary | ICD-10-CM | POA: Diagnosis not present

## 2024-01-26 DIAGNOSIS — G9341 Metabolic encephalopathy: Secondary | ICD-10-CM | POA: Diagnosis not present

## 2024-01-26 DIAGNOSIS — I129 Hypertensive chronic kidney disease with stage 1 through stage 4 chronic kidney disease, or unspecified chronic kidney disease: Secondary | ICD-10-CM | POA: Diagnosis not present

## 2024-01-26 DIAGNOSIS — E1142 Type 2 diabetes mellitus with diabetic polyneuropathy: Secondary | ICD-10-CM | POA: Diagnosis not present

## 2024-01-26 DIAGNOSIS — D631 Anemia in chronic kidney disease: Secondary | ICD-10-CM | POA: Diagnosis not present

## 2024-01-26 DIAGNOSIS — D849 Immunodeficiency, unspecified: Secondary | ICD-10-CM | POA: Diagnosis not present

## 2024-01-26 DIAGNOSIS — H409 Unspecified glaucoma: Secondary | ICD-10-CM | POA: Diagnosis not present

## 2024-01-26 DIAGNOSIS — N184 Chronic kidney disease, stage 4 (severe): Secondary | ICD-10-CM | POA: Diagnosis not present

## 2024-01-26 DIAGNOSIS — K5900 Constipation, unspecified: Secondary | ICD-10-CM | POA: Diagnosis not present

## 2024-01-26 DIAGNOSIS — N3000 Acute cystitis without hematuria: Secondary | ICD-10-CM | POA: Diagnosis not present

## 2024-01-26 DIAGNOSIS — E1122 Type 2 diabetes mellitus with diabetic chronic kidney disease: Secondary | ICD-10-CM | POA: Diagnosis not present

## 2024-01-26 DIAGNOSIS — M47813 Spondylosis without myelopathy or radiculopathy, cervicothoracic region: Secondary | ICD-10-CM | POA: Diagnosis not present

## 2024-01-26 DIAGNOSIS — M722 Plantar fascial fibromatosis: Secondary | ICD-10-CM | POA: Diagnosis not present

## 2024-01-26 DIAGNOSIS — I951 Orthostatic hypotension: Secondary | ICD-10-CM | POA: Diagnosis not present

## 2024-01-26 DIAGNOSIS — D500 Iron deficiency anemia secondary to blood loss (chronic): Secondary | ICD-10-CM | POA: Diagnosis not present

## 2024-01-26 DIAGNOSIS — E11319 Type 2 diabetes mellitus with unspecified diabetic retinopathy without macular edema: Secondary | ICD-10-CM | POA: Diagnosis not present

## 2024-01-26 DIAGNOSIS — J40 Bronchitis, not specified as acute or chronic: Secondary | ICD-10-CM | POA: Diagnosis not present

## 2024-01-26 DIAGNOSIS — E559 Vitamin D deficiency, unspecified: Secondary | ICD-10-CM | POA: Diagnosis not present

## 2024-01-26 DIAGNOSIS — N179 Acute kidney failure, unspecified: Secondary | ICD-10-CM | POA: Diagnosis not present

## 2024-01-26 DIAGNOSIS — E1136 Type 2 diabetes mellitus with diabetic cataract: Secondary | ICD-10-CM | POA: Diagnosis not present

## 2024-01-26 DIAGNOSIS — I2699 Other pulmonary embolism without acute cor pulmonale: Secondary | ICD-10-CM | POA: Diagnosis not present

## 2024-01-26 DIAGNOSIS — E1151 Type 2 diabetes mellitus with diabetic peripheral angiopathy without gangrene: Secondary | ICD-10-CM | POA: Diagnosis not present

## 2024-01-26 DIAGNOSIS — I6523 Occlusion and stenosis of bilateral carotid arteries: Secondary | ICD-10-CM | POA: Diagnosis not present

## 2024-01-26 NOTE — Telephone Encounter (Signed)
 Copied from CRM #[id number]. Topic: Clinical - Home Health Verbal Orders >> Jan 26, 2024 11:52 AM Delon DASEN wrote: Caller/Agency: [facility] Callback Number: [phone] secure line Service Requested: Physical Therapy Frequency: 1x wk 8 weeks for strength, balance, weight and fall prevention Any new concerns about the patient? No

## 2024-01-26 NOTE — Telephone Encounter (Signed)
 Verbal orders given to [facility]

## 2024-01-27 ENCOUNTER — Telehealth: Payer: Self-pay | Admitting: *Deleted

## 2024-01-27 NOTE — Telephone Encounter (Signed)
 Left message on confidential answering machine for [facility] with verbal orders for OT.

## 2024-01-27 NOTE — Telephone Encounter (Signed)
 Copied from CRM #[id number]. Topic: Clinical - Home Health Verbal Orders >> Jan 27, 2024  8:44 AM Burnard DEL wrote: Caller/Agency: [facility] Callback Number: [phone] Service Requested: Occupational Therapy Frequency: Evaluation Any new concerns about the patient? No

## 2024-01-30 DIAGNOSIS — R000 Tachycardia, unspecified: Secondary | ICD-10-CM | POA: Diagnosis not present

## 2024-01-30 DIAGNOSIS — Z95818 Presence of other cardiac implants and grafts: Secondary | ICD-10-CM | POA: Diagnosis not present

## 2024-02-02 ENCOUNTER — Inpatient Hospital Stay: Admitting: Internal Medicine

## 2024-02-02 DIAGNOSIS — H401122 Primary open-angle glaucoma, left eye, moderate stage: Secondary | ICD-10-CM | POA: Diagnosis not present

## 2024-02-04 DIAGNOSIS — D631 Anemia in chronic kidney disease: Secondary | ICD-10-CM | POA: Diagnosis not present

## 2024-02-04 DIAGNOSIS — I951 Orthostatic hypotension: Secondary | ICD-10-CM | POA: Diagnosis not present

## 2024-02-04 DIAGNOSIS — I6523 Occlusion and stenosis of bilateral carotid arteries: Secondary | ICD-10-CM | POA: Diagnosis not present

## 2024-02-04 DIAGNOSIS — E11319 Type 2 diabetes mellitus with unspecified diabetic retinopathy without macular edema: Secondary | ICD-10-CM | POA: Diagnosis not present

## 2024-02-04 DIAGNOSIS — G9341 Metabolic encephalopathy: Secondary | ICD-10-CM | POA: Diagnosis not present

## 2024-02-04 DIAGNOSIS — M722 Plantar fascial fibromatosis: Secondary | ICD-10-CM | POA: Diagnosis not present

## 2024-02-04 DIAGNOSIS — N179 Acute kidney failure, unspecified: Secondary | ICD-10-CM | POA: Diagnosis not present

## 2024-02-04 DIAGNOSIS — E1151 Type 2 diabetes mellitus with diabetic peripheral angiopathy without gangrene: Secondary | ICD-10-CM | POA: Diagnosis not present

## 2024-02-04 DIAGNOSIS — M4312 Spondylolisthesis, cervical region: Secondary | ICD-10-CM | POA: Diagnosis not present

## 2024-02-04 DIAGNOSIS — I129 Hypertensive chronic kidney disease with stage 1 through stage 4 chronic kidney disease, or unspecified chronic kidney disease: Secondary | ICD-10-CM | POA: Diagnosis not present

## 2024-02-04 DIAGNOSIS — H409 Unspecified glaucoma: Secondary | ICD-10-CM | POA: Diagnosis not present

## 2024-02-04 DIAGNOSIS — D638 Anemia in other chronic diseases classified elsewhere: Secondary | ICD-10-CM | POA: Diagnosis not present

## 2024-02-04 DIAGNOSIS — E7841 Elevated Lipoprotein(a): Secondary | ICD-10-CM | POA: Diagnosis not present

## 2024-02-04 DIAGNOSIS — N184 Chronic kidney disease, stage 4 (severe): Secondary | ICD-10-CM | POA: Diagnosis not present

## 2024-02-04 DIAGNOSIS — E1136 Type 2 diabetes mellitus with diabetic cataract: Secondary | ICD-10-CM | POA: Diagnosis not present

## 2024-02-04 DIAGNOSIS — E1122 Type 2 diabetes mellitus with diabetic chronic kidney disease: Secondary | ICD-10-CM | POA: Diagnosis not present

## 2024-02-04 DIAGNOSIS — Z940 Kidney transplant status: Secondary | ICD-10-CM | POA: Diagnosis not present

## 2024-02-04 DIAGNOSIS — J40 Bronchitis, not specified as acute or chronic: Secondary | ICD-10-CM | POA: Diagnosis not present

## 2024-02-04 DIAGNOSIS — Z8673 Personal history of transient ischemic attack (TIA), and cerebral infarction without residual deficits: Secondary | ICD-10-CM | POA: Diagnosis not present

## 2024-02-04 DIAGNOSIS — K5900 Constipation, unspecified: Secondary | ICD-10-CM | POA: Diagnosis not present

## 2024-02-04 DIAGNOSIS — Z86718 Personal history of other venous thrombosis and embolism: Secondary | ICD-10-CM | POA: Diagnosis not present

## 2024-02-04 DIAGNOSIS — B964 Proteus (mirabilis) (morganii) as the cause of diseases classified elsewhere: Secondary | ICD-10-CM | POA: Diagnosis not present

## 2024-02-04 DIAGNOSIS — E1142 Type 2 diabetes mellitus with diabetic polyneuropathy: Secondary | ICD-10-CM | POA: Diagnosis not present

## 2024-02-04 DIAGNOSIS — I2699 Other pulmonary embolism without acute cor pulmonale: Secondary | ICD-10-CM | POA: Diagnosis not present

## 2024-02-04 DIAGNOSIS — E559 Vitamin D deficiency, unspecified: Secondary | ICD-10-CM | POA: Diagnosis not present

## 2024-02-04 DIAGNOSIS — D500 Iron deficiency anemia secondary to blood loss (chronic): Secondary | ICD-10-CM | POA: Diagnosis not present

## 2024-02-04 DIAGNOSIS — N3000 Acute cystitis without hematuria: Secondary | ICD-10-CM | POA: Diagnosis not present

## 2024-02-04 DIAGNOSIS — D849 Immunodeficiency, unspecified: Secondary | ICD-10-CM | POA: Diagnosis not present

## 2024-02-04 DIAGNOSIS — M47813 Spondylosis without myelopathy or radiculopathy, cervicothoracic region: Secondary | ICD-10-CM | POA: Diagnosis not present

## 2024-02-07 DIAGNOSIS — E1122 Type 2 diabetes mellitus with diabetic chronic kidney disease: Secondary | ICD-10-CM | POA: Diagnosis not present

## 2024-02-07 DIAGNOSIS — Z940 Kidney transplant status: Secondary | ICD-10-CM | POA: Diagnosis not present

## 2024-02-29 ENCOUNTER — Telehealth: Payer: Self-pay

## 2024-02-29 NOTE — Telephone Encounter (Signed)
 Copied from CRM [id number]. Topic: Clinical - Home Health Verbal Orders >> Feb 25, 2024 12:18 PM Martinique E wrote: Caller/Agency: [facility] with [facility] Callback Number: [phone] (secure line) Service Requested: Occupational Therapy Frequency: 1x/week for 4 weeks Any new concerns about the patient? No

## 2024-03-05 DIAGNOSIS — Z4509 Encounter for adjustment and management of other cardiac device: Secondary | ICD-10-CM | POA: Diagnosis not present

## 2024-03-06 DIAGNOSIS — Z8619 Personal history of other infectious and parasitic diseases: Secondary | ICD-10-CM | POA: Diagnosis not present

## 2024-03-06 DIAGNOSIS — E1129 Type 2 diabetes mellitus with other diabetic kidney complication: Secondary | ICD-10-CM | POA: Diagnosis not present

## 2024-03-06 DIAGNOSIS — Z940 Kidney transplant status: Secondary | ICD-10-CM | POA: Diagnosis not present

## 2024-03-06 DIAGNOSIS — N184 Chronic kidney disease, stage 4 (severe): Secondary | ICD-10-CM | POA: Diagnosis not present

## 2024-03-06 DIAGNOSIS — Z79899 Other long term (current) drug therapy: Secondary | ICD-10-CM | POA: Diagnosis not present

## 2024-03-06 DIAGNOSIS — Z79621 Long term (current) use of calcineurin inhibitor: Secondary | ICD-10-CM | POA: Diagnosis not present

## 2024-03-06 DIAGNOSIS — D849 Immunodeficiency, unspecified: Secondary | ICD-10-CM | POA: Diagnosis not present

## 2024-03-06 DIAGNOSIS — I10 Essential (primary) hypertension: Secondary | ICD-10-CM | POA: Diagnosis not present

## 2024-03-06 NOTE — Telephone Encounter (Signed)
 Left a message on confidential voicemail for Pavo with [facility] for OT.

## 2024-03-07 ENCOUNTER — Telehealth: Payer: Self-pay

## 2024-03-07 DIAGNOSIS — E1122 Type 2 diabetes mellitus with diabetic chronic kidney disease: Secondary | ICD-10-CM | POA: Diagnosis not present

## 2024-03-07 DIAGNOSIS — Z79899 Other long term (current) drug therapy: Secondary | ICD-10-CM | POA: Diagnosis not present

## 2024-03-07 DIAGNOSIS — Z940 Kidney transplant status: Secondary | ICD-10-CM | POA: Diagnosis not present

## 2024-03-07 DIAGNOSIS — N189 Chronic kidney disease, unspecified: Secondary | ICD-10-CM | POA: Diagnosis not present

## 2024-03-07 DIAGNOSIS — I129 Hypertensive chronic kidney disease with stage 1 through stage 4 chronic kidney disease, or unspecified chronic kidney disease: Secondary | ICD-10-CM | POA: Diagnosis not present

## 2024-03-07 NOTE — Telephone Encounter (Signed)
 Patient was identified as falling into the [place] Measure - Diabetes.   Patient was: Appointment already scheduled for:  03/14/2024 with Dr. Lamarr Mule. Left voicemail for patient to return my call.

## 2024-03-11 ENCOUNTER — Encounter (HOSPITAL_COMMUNITY): Payer: Self-pay

## 2024-03-11 ENCOUNTER — Other Ambulatory Visit: Payer: Self-pay

## 2024-03-11 ENCOUNTER — Inpatient Hospital Stay (HOSPITAL_COMMUNITY)
Admission: EM | Admit: 2024-03-11 | Discharge: 2024-03-15 | DRG: 617 | Disposition: A | Discharge status: Home Health | Attending: Internal Medicine | Admitting: Internal Medicine

## 2024-03-11 DIAGNOSIS — N185 Chronic kidney disease, stage 5: Secondary | ICD-10-CM | POA: Diagnosis present

## 2024-03-11 DIAGNOSIS — Z794 Long term (current) use of insulin: Secondary | ICD-10-CM

## 2024-03-11 DIAGNOSIS — I120 Hypertensive chronic kidney disease with stage 5 chronic kidney disease or end stage renal disease: Secondary | ICD-10-CM | POA: Diagnosis present

## 2024-03-11 DIAGNOSIS — Z7982 Long term (current) use of aspirin: Secondary | ICD-10-CM

## 2024-03-11 DIAGNOSIS — E119 Type 2 diabetes mellitus without complications: Secondary | ICD-10-CM

## 2024-03-11 DIAGNOSIS — E11628 Type 2 diabetes mellitus with other skin complications: Principal | ICD-10-CM | POA: Diagnosis present

## 2024-03-11 DIAGNOSIS — L089 Local infection of the skin and subcutaneous tissue, unspecified: Principal | ICD-10-CM | POA: Diagnosis present

## 2024-03-11 DIAGNOSIS — R262 Difficulty in walking, not elsewhere classified: Secondary | ICD-10-CM | POA: Diagnosis present

## 2024-03-11 DIAGNOSIS — Z833 Family history of diabetes mellitus: Secondary | ICD-10-CM

## 2024-03-11 DIAGNOSIS — Z940 Kidney transplant status: Secondary | ICD-10-CM

## 2024-03-11 DIAGNOSIS — Z888 Allergy status to other drugs, medicaments and biological substances status: Secondary | ICD-10-CM

## 2024-03-11 DIAGNOSIS — Z79621 Long term (current) use of calcineurin inhibitor: Secondary | ICD-10-CM

## 2024-03-11 DIAGNOSIS — E11621 Type 2 diabetes mellitus with foot ulcer: Secondary | ICD-10-CM | POA: Diagnosis not present

## 2024-03-11 DIAGNOSIS — L97519 Non-pressure chronic ulcer of other part of right foot with unspecified severity: Secondary | ICD-10-CM | POA: Diagnosis present

## 2024-03-11 DIAGNOSIS — M869 Osteomyelitis, unspecified: Secondary | ICD-10-CM | POA: Diagnosis present

## 2024-03-11 DIAGNOSIS — E440 Moderate protein-calorie malnutrition: Secondary | ICD-10-CM | POA: Diagnosis present

## 2024-03-11 DIAGNOSIS — Z681 Body mass index (BMI) 19 or less, adult: Secondary | ICD-10-CM

## 2024-03-11 DIAGNOSIS — I70221 Atherosclerosis of native arteries of extremities with rest pain, right leg: Secondary | ICD-10-CM | POA: Diagnosis present

## 2024-03-11 DIAGNOSIS — Z9049 Acquired absence of other specified parts of digestive tract: Secondary | ICD-10-CM

## 2024-03-11 DIAGNOSIS — E785 Hyperlipidemia, unspecified: Secondary | ICD-10-CM | POA: Diagnosis present

## 2024-03-11 DIAGNOSIS — Z7985 Long-term (current) use of injectable non-insulin antidiabetic drugs: Secondary | ICD-10-CM

## 2024-03-11 DIAGNOSIS — Z87891 Personal history of nicotine dependence: Secondary | ICD-10-CM

## 2024-03-11 DIAGNOSIS — Z7952 Long term (current) use of systemic steroids: Secondary | ICD-10-CM

## 2024-03-11 DIAGNOSIS — Z79899 Other long term (current) drug therapy: Secondary | ICD-10-CM

## 2024-03-11 DIAGNOSIS — E1169 Type 2 diabetes mellitus with other specified complication: Secondary | ICD-10-CM | POA: Diagnosis not present

## 2024-03-11 DIAGNOSIS — E1151 Type 2 diabetes mellitus with diabetic peripheral angiopathy without gangrene: Secondary | ICD-10-CM | POA: Diagnosis present

## 2024-03-11 DIAGNOSIS — Z86711 Personal history of pulmonary embolism: Secondary | ICD-10-CM

## 2024-03-11 DIAGNOSIS — Z7901 Long term (current) use of anticoagulants: Secondary | ICD-10-CM

## 2024-03-11 DIAGNOSIS — E1165 Type 2 diabetes mellitus with hyperglycemia: Secondary | ICD-10-CM | POA: Diagnosis not present

## 2024-03-11 DIAGNOSIS — E1122 Type 2 diabetes mellitus with diabetic chronic kidney disease: Secondary | ICD-10-CM | POA: Diagnosis present

## 2024-03-11 LAB — COMPREHENSIVE METABOLIC PANEL WITH GFR
ALT: 8 U/L (ref 0–44)
AST: 13 U/L — ABNORMAL LOW (ref 15–41)
Albumin: 3.5 g/dL (ref 3.5–5.0)
Alkaline Phosphatase: 31 U/L — ABNORMAL LOW (ref 38–126)
Anion gap: 15 (ref 5–15)
BUN: 45 mg/dL — ABNORMAL HIGH (ref 8–23)
CO2: 20 mmol/L — ABNORMAL LOW (ref 22–32)
Calcium: 9.3 mg/dL (ref 8.9–10.3)
Chloride: 104 mmol/L (ref 98–111)
Creatinine, Ser: 3.41 mg/dL — ABNORMAL HIGH (ref 0.61–1.24)
GFR, Estimated: 18 mL/min — ABNORMAL LOW (ref >60)
Glucose, Bld: 84 mg/dL (ref 70–99)
Potassium: 4.8 mmol/L (ref 3.5–5.1)
Sodium: 139 mmol/L (ref 135–145)
Total Bilirubin: 0.9 mg/dL (ref 0.0–1.2)
Total Protein: 6.9 g/dL (ref 6.5–8.1)

## 2024-03-11 LAB — CBC WITH DIFFERENTIAL/PLATELET
Abs Immature Granulocytes: 0.04 K/uL (ref 0.00–0.07)
Basophils Absolute: 0 K/uL (ref 0.0–0.1)
Basophils Relative: 0 %
Eosinophils Absolute: 0 K/uL (ref 0.0–0.5)
Eosinophils Relative: 0 %
HCT: 31.3 % — ABNORMAL LOW (ref 39.0–52.0)
Hemoglobin: 9.6 g/dL — ABNORMAL LOW (ref 13.0–17.0)
Immature Granulocytes: 0 %
Lymphocytes Relative: 6 %
Lymphs Abs: 0.6 K/uL — ABNORMAL LOW (ref 0.7–4.0)
MCH: 27.7 pg (ref 26.0–34.0)
MCHC: 30.7 g/dL (ref 30.0–36.0)
MCV: 90.2 fL (ref 80.0–100.0)
Monocytes Absolute: 0.9 K/uL (ref 0.1–1.0)
Monocytes Relative: 10 %
Neutro Abs: 8.1 K/uL — ABNORMAL HIGH (ref 1.7–7.7)
Neutrophils Relative %: 84 %
Platelets: 188 K/uL (ref 150–400)
RBC: 3.47 MIL/uL — ABNORMAL LOW (ref 4.22–5.81)
RDW: 14.4 % (ref 11.5–15.5)
WBC: 9.7 K/uL (ref 4.0–10.5)
nRBC: 0 % (ref 0.0–0.2)

## 2024-03-11 NOTE — ED Triage Notes (Signed)
 Pt reports wound on R great toe x1 week. H/x diabetes.

## 2024-03-12 ENCOUNTER — Emergency Department (HOSPITAL_COMMUNITY)

## 2024-03-12 DIAGNOSIS — S91101A Unspecified open wound of right great toe without damage to nail, initial encounter: Secondary | ICD-10-CM | POA: Diagnosis not present

## 2024-03-12 DIAGNOSIS — N185 Chronic kidney disease, stage 5: Secondary | ICD-10-CM | POA: Diagnosis present

## 2024-03-12 DIAGNOSIS — I10 Essential (primary) hypertension: Secondary | ICD-10-CM | POA: Diagnosis not present

## 2024-03-12 DIAGNOSIS — E11628 Type 2 diabetes mellitus with other skin complications: Secondary | ICD-10-CM | POA: Diagnosis present

## 2024-03-12 DIAGNOSIS — Z7985 Long-term (current) use of injectable non-insulin antidiabetic drugs: Secondary | ICD-10-CM | POA: Diagnosis not present

## 2024-03-12 DIAGNOSIS — Z940 Kidney transplant status: Secondary | ICD-10-CM | POA: Diagnosis not present

## 2024-03-12 DIAGNOSIS — Z86711 Personal history of pulmonary embolism: Secondary | ICD-10-CM | POA: Diagnosis not present

## 2024-03-12 DIAGNOSIS — E1169 Type 2 diabetes mellitus with other specified complication: Secondary | ICD-10-CM | POA: Diagnosis present

## 2024-03-12 DIAGNOSIS — L97519 Non-pressure chronic ulcer of other part of right foot with unspecified severity: Secondary | ICD-10-CM | POA: Diagnosis present

## 2024-03-12 DIAGNOSIS — Z7952 Long term (current) use of systemic steroids: Secondary | ICD-10-CM | POA: Diagnosis not present

## 2024-03-12 DIAGNOSIS — M7731 Calcaneal spur, right foot: Secondary | ICD-10-CM | POA: Diagnosis not present

## 2024-03-12 DIAGNOSIS — E1165 Type 2 diabetes mellitus with hyperglycemia: Secondary | ICD-10-CM | POA: Diagnosis not present

## 2024-03-12 DIAGNOSIS — L03031 Cellulitis of right toe: Secondary | ICD-10-CM | POA: Diagnosis not present

## 2024-03-12 DIAGNOSIS — E440 Moderate protein-calorie malnutrition: Secondary | ICD-10-CM | POA: Diagnosis present

## 2024-03-12 DIAGNOSIS — I70235 Atherosclerosis of native arteries of right leg with ulceration of other part of foot: Secondary | ICD-10-CM | POA: Diagnosis not present

## 2024-03-12 DIAGNOSIS — Z681 Body mass index (BMI) 19 or less, adult: Secondary | ICD-10-CM | POA: Diagnosis not present

## 2024-03-12 DIAGNOSIS — M869 Osteomyelitis, unspecified: Secondary | ICD-10-CM

## 2024-03-12 DIAGNOSIS — R262 Difficulty in walking, not elsewhere classified: Secondary | ICD-10-CM | POA: Diagnosis present

## 2024-03-12 DIAGNOSIS — Z79621 Long term (current) use of calcineurin inhibitor: Secondary | ICD-10-CM | POA: Diagnosis not present

## 2024-03-12 DIAGNOSIS — Z87891 Personal history of nicotine dependence: Secondary | ICD-10-CM | POA: Diagnosis not present

## 2024-03-12 DIAGNOSIS — E1151 Type 2 diabetes mellitus with diabetic peripheral angiopathy without gangrene: Secondary | ICD-10-CM | POA: Diagnosis not present

## 2024-03-12 DIAGNOSIS — I7092 Chronic total occlusion of artery of the extremities: Secondary | ICD-10-CM | POA: Diagnosis not present

## 2024-03-12 DIAGNOSIS — I70202 Unspecified atherosclerosis of native arteries of extremities, left leg: Secondary | ICD-10-CM | POA: Diagnosis not present

## 2024-03-12 DIAGNOSIS — I120 Hypertensive chronic kidney disease with stage 5 chronic kidney disease or end stage renal disease: Secondary | ICD-10-CM | POA: Diagnosis present

## 2024-03-12 DIAGNOSIS — E1122 Type 2 diabetes mellitus with diabetic chronic kidney disease: Secondary | ICD-10-CM | POA: Diagnosis present

## 2024-03-12 DIAGNOSIS — Z794 Long term (current) use of insulin: Secondary | ICD-10-CM | POA: Diagnosis not present

## 2024-03-12 DIAGNOSIS — L089 Local infection of the skin and subcutaneous tissue, unspecified: Secondary | ICD-10-CM | POA: Diagnosis present

## 2024-03-12 DIAGNOSIS — I70221 Atherosclerosis of native arteries of extremities with rest pain, right leg: Secondary | ICD-10-CM | POA: Diagnosis present

## 2024-03-12 DIAGNOSIS — Z79899 Other long term (current) drug therapy: Secondary | ICD-10-CM | POA: Diagnosis not present

## 2024-03-12 DIAGNOSIS — E785 Hyperlipidemia, unspecified: Secondary | ICD-10-CM | POA: Diagnosis present

## 2024-03-12 DIAGNOSIS — Z7901 Long term (current) use of anticoagulants: Secondary | ICD-10-CM | POA: Diagnosis not present

## 2024-03-12 DIAGNOSIS — Z833 Family history of diabetes mellitus: Secondary | ICD-10-CM | POA: Diagnosis not present

## 2024-03-12 LAB — APTT: aPTT: 160 s — ABNORMAL HIGH (ref 24–36)

## 2024-03-12 LAB — CBG MONITORING, ED
Glucose-Capillary: 52 mg/dL — ABNORMAL LOW (ref 70–99)
Glucose-Capillary: 67 mg/dL — ABNORMAL LOW (ref 70–99)
Glucose-Capillary: 205 mg/dL — ABNORMAL HIGH (ref 70–99)
Glucose-Capillary: 209 mg/dL — ABNORMAL HIGH (ref 70–99)

## 2024-03-12 LAB — CBC
HCT: 32.1 % — ABNORMAL LOW (ref 39.0–52.0)
Hemoglobin: 9.9 g/dL — ABNORMAL LOW (ref 13.0–17.0)
MCH: 27.5 pg (ref 26.0–34.0)
MCHC: 30.8 g/dL (ref 30.0–36.0)
MCV: 89.2 fL (ref 80.0–100.0)
Platelets: 191 K/uL (ref 150–400)
RBC: 3.6 MIL/uL — ABNORMAL LOW (ref 4.22–5.81)
RDW: 14.2 % (ref 11.5–15.5)
WBC: 6.6 K/uL (ref 4.0–10.5)
nRBC: 0 % (ref 0.0–0.2)

## 2024-03-12 LAB — GLUCOSE, CAPILLARY
Glucose-Capillary: 115 mg/dL — ABNORMAL HIGH (ref 70–99)
Glucose-Capillary: 112 mg/dL — ABNORMAL HIGH (ref 70–99)

## 2024-03-12 LAB — CREATININE, SERUM
Creatinine, Ser: 3.72 mg/dL — ABNORMAL HIGH (ref 0.61–1.24)
GFR, Estimated: 16 mL/min — ABNORMAL LOW (ref >60)

## 2024-03-12 LAB — HEPARIN LEVEL (UNFRACTIONATED): Heparin Unfractionated: 0.75 [IU]/mL — ABNORMAL HIGH (ref 0.30–0.70)

## 2024-03-12 LAB — I-STAT CG4 LACTIC ACID, ED: Lactic Acid, Venous: 0.7 mmol/L (ref 0.5–1.9)

## 2024-03-12 MED ORDER — SODIUM CHLORIDE 0.9 % IV SOLN
2.0000 g | INTRAVENOUS | Status: DC
  Administered 2024-03-12 – 2024-03-14 (×3): 2 g via INTRAVENOUS
  Filled 2024-03-12 (×3): qty 12.5

## 2024-03-12 MED ORDER — APIXABAN 5 MG PO TABS: 5.0000 mg | ORAL_TABLET | Freq: Two times a day (BID) | ORAL | Status: DC

## 2024-03-12 MED ORDER — HEPARIN (PORCINE) 25000 UT/250ML-% IV SOLN
800.0000 [IU]/h | INTRAVENOUS | Status: DC
  Administered 2024-03-12: 1000 [IU]/h via INTRAVENOUS
  Administered 2024-03-13: 800 [IU]/h via INTRAVENOUS
  Filled 2024-03-12 (×2): qty 250

## 2024-03-12 MED ORDER — INSULIN GLARGINE-YFGN 100 UNIT/ML ~~LOC~~ SOLN
5.0000 [IU] | Freq: Every day | SUBCUTANEOUS | Status: DC
  Administered 2024-03-12 – 2024-03-13 (×2): 5 [IU] via SUBCUTANEOUS
  Filled 2024-03-12 (×3): qty 0.05

## 2024-03-12 MED ORDER — DEXTROSE 50 % IV SOLN: 12.5000 g | Freq: Once | INTRAVENOUS | Status: DC

## 2024-03-12 MED ORDER — VANCOMYCIN HCL 750 MG/150ML IV SOLN
750.0000 mg | INTRAVENOUS | Status: DC
  Administered 2024-03-14: 750 mg via INTRAVENOUS
  Filled 2024-03-12: qty 150

## 2024-03-12 MED ORDER — DEXTROSE 50 % IV SOLN
INTRAVENOUS | Status: AC
  Administered 2024-03-12: 50 mL
  Filled 2024-03-12: qty 50

## 2024-03-12 MED ORDER — PIPERACILLIN-TAZOBACTAM 3.375 G IVPB 30 MIN
3.3750 g | Freq: Once | INTRAVENOUS | Status: AC
  Administered 2024-03-12: 3.375 g via INTRAVENOUS
  Filled 2024-03-12: qty 50

## 2024-03-12 MED ORDER — FUROSEMIDE 40 MG PO TABS
40.0000 mg | ORAL_TABLET | Freq: Every day | ORAL | Status: DC
  Administered 2024-03-12 – 2024-03-15 (×3): 40 mg via ORAL
  Filled 2024-03-12: qty 2
  Filled 2024-03-12 (×2): qty 1

## 2024-03-12 MED ORDER — FOLIC ACID 1 MG PO TABS
1.0000 mg | ORAL_TABLET | Freq: Every day | ORAL | Status: DC
  Administered 2024-03-12 – 2024-03-15 (×3): 1 mg via ORAL
  Filled 2024-03-12 (×3): qty 1

## 2024-03-12 MED ORDER — CALCITRIOL 0.25 MCG PO CAPS
0.2500 ug | ORAL_CAPSULE | ORAL | Status: DC
  Administered 2024-03-15: 0.25 ug via ORAL
  Filled 2024-03-12 (×3): qty 1

## 2024-03-12 MED ORDER — SEVELAMER CARBONATE 800 MG PO TABS
800.0000 mg | ORAL_TABLET | Freq: Three times a day (TID) | ORAL | Status: DC
  Administered 2024-03-12 – 2024-03-15 (×7): 800 mg via ORAL
  Filled 2024-03-12 (×8): qty 1

## 2024-03-12 MED ORDER — PREDNISONE 5 MG PO TABS
5.0000 mg | ORAL_TABLET | Freq: Every day | ORAL | Status: DC
  Administered 2024-03-13 – 2024-03-15 (×3): 5 mg via ORAL
  Filled 2024-03-12 (×3): qty 1

## 2024-03-12 MED ORDER — MELATONIN 5 MG PO TABS
10.0000 mg | ORAL_TABLET | Freq: Every day | ORAL | Status: DC
  Administered 2024-03-12 – 2024-03-14 (×3): 10 mg via ORAL
  Filled 2024-03-12 (×3): qty 2

## 2024-03-12 MED ORDER — ATORVASTATIN CALCIUM 40 MG PO TABS
40.0000 mg | ORAL_TABLET | Freq: Every day | ORAL | Status: DC
Start: 2024-03-12 — End: 2024-03-14
  Administered 2024-03-12 – 2024-03-14 (×2): 40 mg via ORAL
  Filled 2024-03-12 (×2): qty 1

## 2024-03-12 MED ORDER — ENOXAPARIN SODIUM 40 MG/0.4ML IJ SOSY: 40.0000 mg | PREFILLED_SYRINGE | INTRAMUSCULAR | Status: DC

## 2024-03-12 MED ORDER — HEPARIN SODIUM (PORCINE) 5000 UNIT/ML IJ SOLN
5000.0000 [IU] | Freq: Three times a day (TID) | INTRAMUSCULAR | Status: DC
  Administered 2024-03-12: 5000 [IU] via SUBCUTANEOUS
  Filled 2024-03-12: qty 1

## 2024-03-12 MED ORDER — TACROLIMUS 1 MG PO CAPS
3.0000 mg | ORAL_CAPSULE | Freq: Two times a day (BID) | ORAL | Status: DC
  Administered 2024-03-12 – 2024-03-15 (×5): 3 mg via ORAL
  Filled 2024-03-12 (×8): qty 3

## 2024-03-12 MED ORDER — VANCOMYCIN HCL 1250 MG/250ML IV SOLN
1250.0000 mg | Freq: Once | INTRAVENOUS | Status: AC
  Administered 2024-03-12: 1250 mg via INTRAVENOUS
  Filled 2024-03-12: qty 250

## 2024-03-12 MED ORDER — AMLODIPINE BESYLATE 2.5 MG PO TABS
2.5000 mg | ORAL_TABLET | Freq: Every day | ORAL | Status: DC
  Administered 2024-03-12 – 2024-03-15 (×3): 2.5 mg via ORAL
  Filled 2024-03-12 (×3): qty 1

## 2024-03-12 MED ORDER — INSULIN ASPART 100 UNIT/ML IJ SOLN
0.0000 [IU] | Freq: Three times a day (TID) | INTRAMUSCULAR | Status: DC
  Administered 2024-03-12: 5 [IU] via SUBCUTANEOUS
  Administered 2024-03-13: 3 [IU] via SUBCUTANEOUS
  Administered 2024-03-13: 2 [IU] via SUBCUTANEOUS
  Administered 2024-03-14 (×2): 3 [IU] via SUBCUTANEOUS
  Administered 2024-03-14: 5 [IU] via SUBCUTANEOUS
  Administered 2024-03-15: 2 [IU] via SUBCUTANEOUS

## 2024-03-12 MED ORDER — FAMOTIDINE 20 MG PO TABS
20.0000 mg | ORAL_TABLET | Freq: Every day | ORAL | Status: DC
  Administered 2024-03-12 – 2024-03-15 (×3): 20 mg via ORAL
  Filled 2024-03-12 (×3): qty 1

## 2024-03-12 MED ORDER — CARVEDILOL 12.5 MG PO TABS
12.5000 mg | ORAL_TABLET | Freq: Two times a day (BID) | ORAL | Status: DC
  Administered 2024-03-12 – 2024-03-15 (×6): 12.5 mg via ORAL
  Filled 2024-03-12 (×6): qty 1

## 2024-03-12 NOTE — Progress Notes (Signed)
 Pharmacy Antibiotic Note  [name] [name] is a 73 y.o. male for which pharmacy has been consulted for cefepime  and vancomycin  dosing for wound infection.  73 yom with a history of renal transplant, PE on eliquis , HTN, HLD, T2DM, and recent admission for UTI. Patient is presenting with right great toe infection w/ ulceration. XR c/f osteomyelitis   SCr 3.41 - near baseline WBC 9.7; LA 0.7; T 97.4; HR 56; RR 17  Zosyn  x 1 in ED  Plan: Cefepime  2g q24hr  Vancomycin  1250 mg once then 750 mg q48hr (eAUC 487.9; Using TBW) unless change in renal function Monitor WBC, fever, renal function, cultures De-escalate when able Levels at steady state  Height: 5' 10 (177.8 cm) Weight: 60.4 kg (133 lb 2.5 oz) IBW/kg (Calculated) : 73  Temp (24hrs), Avg:97.7 F (36.5 C), Min:97.4 F (36.3 C), Max:98 F (36.7 C)  Recent Labs  Lab 03/11/24 2114 03/12/24 0831  WBC 9.7  --   CREATININE 3.41*  --   LATICACIDVEN  --  0.7    Estimated Creatinine Clearance: 16 mL/min (A) (by C-G formula based on SCr of 3.41 mg/dL (H)).    Allergies  Allergen Reactions   Other Rash    Oxyquinoline-white Pet-lanolin   Aquamed Rash   Bag Balm  [Albolene] Rash   Cold Cream Rash   Microbiology results: Pending  Thank you for allowing pharmacy to be a part of this patient's care.  Dorn Buttner, PharmD, BCPS 03/12/2024 11:09 AM ED Clinical Pharmacist -  [phone]

## 2024-03-12 NOTE — Consult Note (Addendum)
 PODIATRY CONSULTATION  NAME [name] [name] MRN [id number] DOB [birth year] DOA 03/11/2024   Reason for consult:  Chief Complaint  Patient presents with   Wound Infection    Attending/Consulting physician:  Dr. Marsha Ada, MD History of present illness: 73 y.o. male male with medical history significant of renal transplant on immunosuppression therapy, PE on Eliquis , HTN, HLD, DM2, and recent admission in 01/2024 for Proteus/Klebsiella UTI treated with PO cefadroxil  for 5 days who p/w R great toe diabetic foot infection c/b open ulceration and XR c/f osteomyelitis.   He reports first noticing the ulceration about 1 week ago. It does appear to be more chronic.  He reports noticing increasing pain to the right first toe and forefoot around this timeframe making it difficult for him to ambulate.  He has not noticed significant drainage.  On chart review, it does appear that he has had prior ulceration here, he was last seen by Podiatry, Dr. Tobie in April of 2024 and the ulceration was healed at that time. Most recent A1c 8.3  Past Medical History:  Diagnosis Date   Anemia    as a younger man   Arthritis    hands   Cataract    Chronic kidney disease    starting to bother me now (07/28/2012)   Diabetes mellitus     type II   Diarrhea, functional    ED (erectile dysfunction)    Hypertension    Neuropathy    Peripheral neuropathy        Latest Ref Rng & Units 03/12/2024   12:26 PM 03/11/2024    9:14 PM 01/23/2024    1:47 AM  CBC  WBC 4.0 - 10.5 K/uL 6.6  9.7  7.6   Hemoglobin 13.0 - 17.0 g/dL 9.9  9.6  9.2   Hematocrit 39.0 - 52.0 % 32.1  31.3  29.7   Platelets 150 - 400 K/uL 191  188  225        Latest Ref Rng & Units 03/11/2024    9:14 PM 01/23/2024    1:47 AM 01/22/2024    2:04 AM  BMP  Glucose 70 - 99 mg/dL 84  800  767   BUN 8 - 23 mg/dL 45  47  50   Creatinine 0.61 - 1.24 mg/dL 6.58  6.31  6.20   Sodium 135 - 145 mmol/L 139  134  137   Potassium 3.5 - 5.1  mmol/L 4.8  4.6  4.7   Chloride 98 - 111 mmol/L 104  106  107   CO2 22 - 32 mmol/L 20  20  20    Calcium  8.9 - 10.3 mg/dL 9.3  9.0  9.1       Physical Exam: Lower Extremity Exam Vasc: Non palpable DP and PT pulses, cft 3-5 seconds to the digits. +2 pitting edema present. Right first toe cellulitic appearing.  Derm: Ulceration present right first toe distal medial aspect with fibronecrotic base.  Does probe deep at least to  subcutaneous tissue, scant fibrous slough noted. No streaking erythema. No significant malodor. Pedal skin dry and xerotic diffusely.  MSK:  R - No gross deformities. Compartments soft, non-tender, compressible  L - No gross deformities. Compartments soft, non-tender, compressible  Neuro: Light touch sensation diminished to the toes.    Radiographs: Right foot 3 views Evidence of cortical erosion present distal medial right first toe distal phalanx, this corresponds to the level of ulceration with locus of free air associated with this.  Extensive arterial calcifications noted  ASSESSMENT/PLAN OF CARE 73 y.o. male with PMHx significant for  medical history significant of renal transplant on immunosuppression therapy, PE on Eliquis , HTN, HLD, DM2, and recent admission in 01/2024 for Proteus/Klebsiella UTI treated with PO cefadroxil  for 5 days with right first toe ulceration and associated osteomyelitis  - Findings reviewed with patient -Betadine wet-to-dry dressing applied.  Change daily - No leukocytosis,  afebrile, vital signs stable -ABI and arterial duplex ordered due to diminished pedal pulses and calcification seen on radiographs.  Placing Consult to vascular surgery. - Continue IV abx broad spectrum pending further culture data - WB status: May weight-bear as tolerated in surgical shoe -Discussed with patient that he will need surgical intervention due to the underlying bone infection following vascular evaluation to determine healing potential. - Will  continue to follow   Thank you for the consult.  Please contact me directly with any questions or concerns.           Ethan Saddler, DPM [facility]    [address] [place], [place] [address]                Office [phone]  Fax [phone]

## 2024-03-12 NOTE — Progress Notes (Signed)
 ANTICOAGULATION CONSULT NOTE  Pharmacy Consult for Heparin  Indication: Hx of VTE  Allergies  Allergen Reactions   Other Rash    Oxyquinoline-white Pet-lanolin   Aquamed Rash   Bag Balm  [Albolene] Rash   Cold Cream Rash    Patient Measurements: Height: 5' 10 (177.8 cm) Weight: 60.4 kg (133 lb 2.5 oz) IBW/kg (Calculated) : 73 Heparin  Dosing Weight: 60.4 kg  Vital Signs: Temp: 97.4 F (36.3 C) (10/26 1644) Temp Source: Oral (10/26 1644) BP: 110/59 (10/26 1615) Pulse Rate: 72 (10/26 1615)  Labs: Recent Labs    03/11/24 2114 03/12/24 1226 03/12/24 2048  HGB 9.6* 9.9*  --   HCT 31.3* 32.1*  --   PLT 188 191  --   APTT  --   --  160*  HEPARINUNFRC  --   --  0.75*  CREATININE 3.41* 3.72*  --     Estimated Creatinine Clearance: 14.7 mL/min (A) (by C-G formula based on SCr of 3.72 mg/dL (H)).   Medical History: Past Medical History:  Diagnosis Date   Anemia    as a younger man   Arthritis    hands   Cataract    Chronic kidney disease    starting to bother me now (07/28/2012)   Diabetes mellitus     type II   Diarrhea, functional    ED (erectile dysfunction)    Hypertension    Neuropathy    Peripheral neuropathy     Medications:  Medications Prior to Admission  Medication Sig Dispense Refill Last Dose/Taking   amLODipine  (NORVASC ) 2.5 MG tablet Take 2.5 mg by mouth daily.   03/10/2024   aspirin  EC 81 MG tablet Take 81 mg by mouth daily.   03/10/2024   atorvastatin  (LIPITOR) 40 MG tablet Take 1 tablet (40 mg total) by mouth daily. 30 tablet 0 03/10/2024   BAQSIMI ONE PACK 3 MG/DOSE POWD Place 1 spray into the nose daily as needed for low blood sugar.   Unknown   calcitRIOL  (ROCALTROL ) 0.25 MCG capsule Take 0.25 mcg by mouth every Monday, Wednesday, and Friday.   03/10/2024   carvedilol  (COREG ) 12.5 MG tablet Take 12.5 mg by mouth 2 (two) times daily with a meal.   03/10/2024   famotidine  (PEPCID ) 20 MG tablet Take 20 mg by mouth daily.   03/10/2024    folic acid  (FOLVITE ) 1 MG tablet Take 1 mg by mouth daily.   03/10/2024   furosemide  (LASIX ) 40 MG tablet Take 40 mg by mouth daily.   03/10/2024   insulin  glargine (LANTUS  SOLOSTAR) 100 UNIT/ML Solostar Pen Inject 6 Units into the skin at bedtime.   Past Week   insulin  lispro (HUMALOG KWIKPEN) 100 UNIT/ML KwikPen Inject 0-15 Units into the skin 3 (three) times daily as needed (high blood sugar).   Unknown   Melatonin 10 MG TABS Take 10 mg by mouth at bedtime.   Past Week   predniSONE  (DELTASONE ) 5 MG tablet Take 5 mg by mouth daily with breakfast.   03/10/2024   Semaglutide,0.25 or 0.5MG /DOS, 2 MG/3ML SOPN Inject 0.25 mg into the skin once a week. Sundays   03/05/2024   tacrolimus  (PROGRAF ) 1 MG capsule Take 3 mg by mouth 2 (two) times daily.   03/10/2024   thiamine  100 MG tablet Take 100 mg by mouth daily.   03/10/2024   apixaban  (ELIQUIS ) 5 MG TABS tablet Take 5 mg by mouth 2 (two) times daily. (Patient not taking: Reported on 03/12/2024)   Not Taking  cefadroxil  (DURICEF) 500 MG capsule Take 1 capsule (500 mg total) by mouth daily. X 2 more days (Patient not taking: Reported on 01/25/2024) 2 capsule 0 Not Taking   sevelamer  carbonate (RENVELA ) 800 MG tablet  (Patient not taking: Reported on 03/12/2024)   Not Taking   Scheduled:   amLODipine   2.5 mg Oral Daily   atorvastatin   40 mg Oral Daily   [START ON 03/13/2024] calcitRIOL   0.25 mcg Oral Q M,W,F   carvedilol   12.5 mg Oral BID WC   famotidine   20 mg Oral Daily   folic acid   1 mg Oral Daily   furosemide   40 mg Oral Daily   insulin  aspart  0-15 Units Subcutaneous TID WC   insulin  glargine-yfgn  5 Units Subcutaneous QHS   melatonin  10 mg Oral QHS   [START ON 03/13/2024] predniSONE   5 mg Oral Q breakfast   sevelamer  carbonate  800 mg Oral TID WC   tacrolimus   3 mg Oral BID   Infusions:   ceFEPime  (MAXIPIME ) IV 2 g (03/12/24 1621)   heparin  1,000 Units/hr (03/12/24 1244)   [START ON 03/14/2024] vancomycin      PRN:    Assessment: 73 yom with a history of renal transplant, PE on eliquis , HTN, HLD, T2DM, and recent admission for UTI. Patient is presenting with right great toe infection w/ ulceration. XR c/f osteomyelitis. Heparin  per pharmacy consult placed for Hx of VTE.  Patient is prescribed apixaban . Last dose per patient however is not in the last 2 weeks.   Initial heparin  level and aPTT both elevated, appear to be correlating so will stop aPTT monitoring.  Goal of Therapy:  Heparin  level 0.3-0.7 units/ml aPTT 66-102 seconds Monitor platelets by anticoagulation protocol: Yes   Plan:  Reduce heparin  to 800 units/h Recheck heparin  level in 8h  Ozell Jamaica, PharmD, [place], [facility] Clinical Pharmacist [phone] Please check AMION for all [facility] Pharmacy numbers 03/12/2024

## 2024-03-12 NOTE — Plan of Care (Signed)

## 2024-03-12 NOTE — H&P (Addendum)
 History and Physical    Patient: [name] [name] DOB: 1950/10/26 DOA: 03/11/2024 DOS: the patient was seen and examined on 03/12/2024 PCP: Theophilus Andrews, Tully GRADE, MD  Patient coming from: Home  Chief Complaint:  Chief Complaint  Patient presents with   Wound Infection   HPI: [name] [name] is a 73 y.o. male with medical history significant of renal transplant on immunosuppression therapy, PE on Eliquis , HTN, HLD, DM2, and recent admission in 01/2024 for Proteus/Klebsiella UTI treated with PO cefadroxil  for 5 days who p/w R great toe diabetic foot infection c/b open ulceration and XR c/f osteomyelitis.    The patient presented with a problem involving the right big toe. The issue began on Wednesday with pain in the area, but there was no redness noted in the entire foot. The situation progressed to where a spot on the toe opened up and began bleeding. There was no drainage or pus reported. The patient reported severe pain, especially when standing, which led to an inability to walk. The patient did not attempt any treatment at home due to difficulty in accessing the area. The decision to seek hospital care was made when the patient could no longer bear weight on the foot.  In the ED, the pt AFVSS. Labs notable for Cr 3.41 (which is baseline), and lactic acid 0.7. XR R foot showed erosion of the first distal phalanx tuft consistent with osteomyelitis. EDP started IV Zosyn , consulted Podiatry/Ortho who agreed to eval pt later today and requested medicine admission.   Review of Systems: As mentioned in the history of present illness. All other systems reviewed and are negative. Past Medical History:  Diagnosis Date   Anemia    as a younger man   Arthritis    hands   Cataract    Chronic kidney disease    starting to bother me now (07/28/2012)   Diabetes mellitus     type II   Diarrhea, functional    ED (erectile dysfunction)    Hypertension    Neuropathy     Peripheral neuropathy    Past Surgical History:  Procedure Laterality Date   APPENDECTOMY  1950's   AV FISTULA PLACEMENT Right 05/14/2016   Procedure: ARTERIOVENOUS (AV) FISTULA CREATION- RIGHT ARM;  Surgeon: Gaile LELON New, MD;  Location: [facility] OR;  Service: Vascular;  Laterality: Right;   COLONOSCOPY     KIDNEY TRANSPLANT Right    PROSTATE SURGERY     UPPER GASTROINTESTINAL ENDOSCOPY     Social History:  reports that he quit smoking about 37 years ago. His smoking use included cigarettes. He started smoking about 47 years ago. He has a 1.2 pack-year smoking history. He has never used smokeless tobacco. He reports that he does not currently use alcohol after a past usage of about 1.0 standard drink of alcohol per week. He reports that he does not use drugs.  Allergies  Allergen Reactions   Other Rash    Oxyquinoline-white Pet-lanolin   Aquamed Rash   Bag Balm  [Albolene] Rash   Cold Cream Rash    Family History  Problem Relation Age of Onset   Colon polyps Mother    Stroke Mother    Diabetes Father    Diabetes Cousin    Prostate cancer Brother    Other Daughter        kidney problems   Esophageal cancer Maternal Aunt    Colon cancer Neg Hx    Stomach cancer Neg Hx  Rectal cancer Neg Hx     Prior to Admission medications   Medication Sig Start Date End Date Taking? Authorizing Provider  amLODipine  (NORVASC ) 2.5 MG tablet Take 2.5 mg by mouth daily. 08/16/21   [provider]  apixaban  (ELIQUIS ) 5 MG TABS tablet Take 5 mg by mouth 2 (two) times daily.    [provider]  atorvastatin  (LIPITOR) 40 MG tablet Take 1 tablet (40 mg total) by mouth daily. 01/24/24   Elpidio Reyes DEL, MD  BAQSIMI ONE PACK 3 MG/DOSE POWD Place 1 spray into the nose daily as needed for low blood sugar. Patient not taking: Reported on 01/25/2024 05/13/21   [provider]  calcitRIOL  (ROCALTROL ) 0.25 MCG capsule Take 0.25 mcg by mouth every Monday, Wednesday, and Friday. 11/22/23  11/21/24  [provider]  carvedilol  (COREG ) 12.5 MG tablet Take 12.5 mg by mouth 2 (two) times daily with a meal.    [provider]  cefadroxil  (DURICEF) 500 MG capsule Take 1 capsule (500 mg total) by mouth daily. X 2 more days Patient not taking: Reported on 01/25/2024 01/24/24   Elpidio Reyes DEL, MD  famotidine  (PEPCID ) 20 MG tablet Take 20 mg by mouth daily. 09/12/20   [provider]  folic acid  (FOLVITE ) 1 MG tablet Take 1 mg by mouth daily. 09/12/20   [provider]  furosemide  (LASIX ) 40 MG tablet Take 40 mg by mouth daily. 07/19/21   [provider]  insulin  glargine (LANTUS  SOLOSTAR) 100 UNIT/ML Solostar Pen Inject 5 Units into the skin at bedtime.    [provider]  insulin  lispro (HUMALOG KWIKPEN) 100 UNIT/ML KwikPen Inject 0-15 Units into the skin 3 (three) times daily as needed (high blood sugar).    [provider]  Melatonin 10 MG TABS Take 10 mg by mouth at bedtime.    [provider]  predniSONE  (DELTASONE ) 5 MG tablet Take 5 mg by mouth daily with breakfast.    [provider]  Semaglutide,0.25 or 0.5MG /DOS, 2 MG/3ML SOPN Inject 0.5 mg into the skin once a week. 08/16/23   [provider]  sevelamer  carbonate (RENVELA ) 800 MG tablet  10/11/17   [provider]  tacrolimus  (PROGRAF ) 1 MG capsule Take 3 mg by mouth 2 (two) times daily.    [provider]  thiamine  100 MG tablet Take 100 mg by mouth daily. 09/11/20   [provider]    Physical Exam: Vitals:   03/11/24 2108 03/11/24 2320 03/12/24 0301 03/12/24 0649  BP:  (!) 143/58 130/76 132/63  Pulse:  (!) 58 61 (!) 56  Resp:  19 17 17   Temp:  98 F (36.7 C) (!) 97.5 F (36.4 C) 97.6 F (36.4 C)  SpO2:  100% 98% 99%  Weight: 60.4 kg     Height: 5' 10 (1.778 m)      General: Alert, oriented x3, resting comfortably in no acute distress Respiratory: Lungs clear to auscultation bilaterally with normal  respiratory effort; no w/r/r Cardiovascular: Regular rate and rhythm w/o m/r/g Abdomen: Soft, nontender, nondistended. Positive bowel sounds MSK: R great toe ulceration noted with bandage in place c/d/i   Data Reviewed:  Lab Results  Component Value Date   WBC 9.7 03/11/2024   HGB 9.6 (L) 03/11/2024   HCT 31.3 (L) 03/11/2024   MCV 90.2 03/11/2024   PLT 188 03/11/2024   Lab Results  Component Value Date   GLUCOSE 84 03/11/2024   CALCIUM  9.3 03/11/2024   NA 139  03/11/2024   K 4.8 03/11/2024   CO2 20 (L) 03/11/2024   CL 104 03/11/2024   BUN 45 (H) 03/11/2024   CREATININE 3.41 (H) 03/11/2024   Lab Results  Component Value Date   ALT 8 03/11/2024   AST 13 (L) 03/11/2024   ALKPHOS 31 (L) 03/11/2024   BILITOT 0.9 03/11/2024   Lab Results  Component Value Date   INR 1.2 01/16/2020   INR 1.1 06/14/2019   Radiology: DG Foot Complete Right Result Date: 03/12/2024 EXAM: 3 OR MORE VIEW(S) XRAY OF THE RIGHT FOOT 03/12/2024 08:33:00 AM COMPARISON: Comparison right foot radiographs 07/14/2022. CLINICAL HISTORY: 73 year old male. Right great toe infection. Wound on right great toe x1 week. History of diabetes. FINDINGS: BONES AND JOINTS: Mild erosive changes of the tuft of the first distal phalanx , new and most compatible with osteomyelitis. Calcaneal spur. No joint dislocation. Moderate degenerative changes of the first MTP joint. SOFT TISSUES: Advanced calcified peripheral vascular disease. Soft tissue irregularity at the distal great toe. Vascular calcifications. IMPRESSION: 1. Erosion of the first distal phalanx tuft consistent with osteomyelitis. 2. Severe calcified peripheral vascular disease. Electronically signed by: Helayne Hurst MD 03/12/2024 08:52 AM EDT RP Workstation: [id number]    Assessment and Plan: 79M h/o renal transplant on immunosuppression therapy, PE on Eliquis , HTN, HLD, DM2, and recent admission in 01/2024 for Proteus/Klebsiella UTI treated with PO cefadroxil  for 5  days who p/w R great toe diabetic foot infection c/b open ulceration and XR c/f osteomyelitis.    R great toe diabetic foot infection -Podiatry/Ortho consulted; apprec eval/recs (agree with TBI/ABIs) -IV vancomycin  and cefepime  per pharmacy protocol -F/u blood cultures; if positive, will need TTE to exlcude IE -F/u MRI R foot  H/o PE on Eliquis  -HOLD pta Eliquis  5mg  BID -Start hep gtt per protocol for now  HTN -PTA Coreg  and lasix   HLD -PTA atorvastatin    DM2 A1c 8.3 in 01/2024 -Semglee  5U nightly + SSI TID AC prn   Advance Care Planning:   Code Status: Full Code   Consults: Podiatry/Ortho  Family Communication: Spouse  Severity of Illness: The appropriate patient status for this patient is INPATIENT. Inpatient status is judged to be reasonable and necessary in order to provide the required intensity of service to ensure the patient's safety. The patient's presenting symptoms, physical exam findings, and initial radiographic and laboratory data in the context of their chronic comorbidities is felt to place them at high risk for further clinical deterioration. Furthermore, it is not anticipated that the patient will be medically stable for discharge from the hospital within 2 midnights of admission.   * I certify that at the point of admission it is my clinical judgment that the patient will require inpatient hospital care spanning beyond 2 midnights from the point of admission due to high intensity of service, high risk for further deterioration and high frequency of surveillance required.*   ------- I spent 55 minutes reviewing previous notes, at the bedside counseling/discussing the treatment plan, and performing clinical documentation.  Author: Marsha Ada, MD 03/12/2024 10:39 AM  For on call review [email or web address].

## 2024-03-12 NOTE — ED Provider Notes (Signed)
 [place] EMERGENCY DEPARTMENT AT [facility] Provider Note   CSN: [id number] Arrival date & time: 03/11/24  1925     Patient presents with: Wound Infection   [name] [name] is a 73 y.o. male.   73 year old male with prior medical history as detailed below.  Patient complains of pain and infection on his right great toe x 1 week.  Patient with history of diabetes.  Patient with history of renal transplant.  Patient is compliant with previously prescribed immunomodulators.  Patient denies fever.  He denies trauma to the foot.  He has previously seen podiatry at [facility].  The history is provided by the patient and medical records.       Prior to Admission medications   Medication Sig Start Date End Date Taking? Authorizing Provider  amLODipine  (NORVASC ) 2.5 MG tablet Take 2.5 mg by mouth daily. 08/16/21   [provider]  apixaban  (ELIQUIS ) 5 MG TABS tablet Take 5 mg by mouth 2 (two) times daily.    [provider]  atorvastatin  (LIPITOR) 40 MG tablet Take 1 tablet (40 mg total) by mouth daily. 01/24/24   Elpidio Reyes DEL, MD  BAQSIMI ONE PACK 3 MG/DOSE POWD Place 1 spray into the nose daily as needed for low blood sugar. Patient not taking: Reported on 01/25/2024 05/13/21   [provider]  calcitRIOL  (ROCALTROL ) 0.25 MCG capsule Take 0.25 mcg by mouth every Monday, Wednesday, and Friday. 11/22/23 11/21/24  [provider]  carvedilol  (COREG ) 12.5 MG tablet Take 12.5 mg by mouth 2 (two) times daily with a meal.    [provider]  cefadroxil  (DURICEF) 500 MG capsule Take 1 capsule (500 mg total) by mouth daily. X 2 more days Patient not taking: Reported on 01/25/2024 01/24/24   Elpidio Reyes DEL, MD  famotidine  (PEPCID ) 20 MG tablet Take 20 mg by mouth daily. 09/12/20   [provider]  folic acid  (FOLVITE ) 1 MG tablet Take 1 mg by mouth daily. 09/12/20   [provider]  furosemide  (LASIX ) 40 MG tablet  Take 40 mg by mouth daily. 07/19/21   [provider]  insulin  glargine (LANTUS  SOLOSTAR) 100 UNIT/ML Solostar Pen Inject 5 Units into the skin at bedtime.    [provider]  insulin  lispro (HUMALOG KWIKPEN) 100 UNIT/ML KwikPen Inject 0-15 Units into the skin 3 (three) times daily as needed (high blood sugar).    [provider]  Melatonin 10 MG TABS Take 10 mg by mouth at bedtime.    [provider]  predniSONE  (DELTASONE ) 5 MG tablet Take 5 mg by mouth daily with breakfast.    [provider]  Semaglutide,0.25 or 0.5MG /DOS, 2 MG/3ML SOPN Inject 0.5 mg into the skin once a week. 08/16/23   [provider]  sevelamer  carbonate (RENVELA ) 800 MG tablet  10/11/17   [provider]  tacrolimus  (PROGRAF ) 1 MG capsule Take 3 mg by mouth 2 (two) times daily.    [provider]  thiamine  100 MG tablet Take 100 mg by mouth daily. 09/11/20   [provider]    Allergies: Other, Aquamed, Bag balm  [albolene], and Cold cream    Review of Systems  All other systems reviewed and are negative.   Updated Vital Signs BP 132/63 (BP Location: Left Arm)   Pulse (!) 56   Temp 97.6 F (36.4 C)   Resp 17   Ht 5' 10 (1.778 m)   Wt 60.4 kg  SpO2 99%   BMI 19.11 kg/m   Physical Exam Vitals and nursing note reviewed.  Constitutional:      General: He is not in acute distress.    Appearance: Normal appearance. He is well-developed.  HENT:     Head: Normocephalic and atraumatic.  Eyes:     Conjunctiva/sclera: Conjunctivae normal.     Pupils: Pupils are equal, round, and reactive to light.  Cardiovascular:     Rate and Rhythm: Normal rate and regular rhythm.     Heart sounds: Normal heart sounds.  Pulmonary:     Effort: Pulmonary effort is normal. No respiratory distress.     Breath sounds: Normal breath sounds.  Abdominal:     General: There is no distension.     Palpations: Abdomen is soft.     Tenderness: There is no  abdominal tenderness.  Musculoskeletal:        General: No deformity. Normal range of motion.     Cervical back: Normal range of motion and neck supple.  Skin:    General: Skin is warm and dry.     Comments: Right great toe with cellulitis / infection.  See images below.  Neurological:     General: No focal deficit present.     Mental Status: He is alert and oriented to person, place, and time.        (all labs ordered are listed, but only abnormal results are displayed) Labs Reviewed  COMPREHENSIVE METABOLIC PANEL WITH GFR - Abnormal; Notable for the following components:      Result Value   CO2 20 (*)    BUN 45 (*)    Creatinine, Ser 3.41 (*)    AST 13 (*)    Alkaline Phosphatase 31 (*)    GFR, Estimated 18 (*)    All other components within normal limits  CBC WITH DIFFERENTIAL/PLATELET - Abnormal; Notable for the following components:   RBC 3.47 (*)    Hemoglobin 9.6 (*)    HCT 31.3 (*)    Neutro Abs 8.1 (*)    Lymphs Abs 0.6 (*)    All other components within normal limits  CULTURE, BLOOD (ROUTINE X 2)  CULTURE, BLOOD (ROUTINE X 2)  I-STAT CG4 LACTIC ACID, ED    EKG: None  Radiology: No results found.   Procedures   Medications Ordered in the ED  piperacillin -tazobactam (ZOSYN ) IVPB 3.375 g (has no administration in time range)                                    Medical Decision Making Patient is presenting with right great toe infection.  Exam is concerning for possible osteomyelitis.  Antibiotics initiated in ED.  Patient is known to [facility]. Dr. Ray will consult.   Hospitalist service is aware of case and evaluate for admission.  Amount and/or Complexity of Data Reviewed Labs: ordered. Radiology: ordered.  Risk Prescription drug management. Decision regarding hospitalization.        Final diagnoses:  Diabetic foot infection [facility])    ED Discharge Orders     None          Laurice Maude BROCKS, MD 03/12/24  [phone]

## 2024-03-12 NOTE — Progress Notes (Addendum)
 ANTICOAGULATION CONSULT NOTE  Pharmacy Consult for Heparin  Indication: Hx of VTE  Allergies  Allergen Reactions   Other Rash    Oxyquinoline-white Pet-lanolin   Aquamed Rash   Bag Balm  [Albolene] Rash   Cold Cream Rash    Patient Measurements: Height: 5' 10 (177.8 cm) Weight: 60.4 kg (133 lb 2.5 oz) IBW/kg (Calculated) : 73 Heparin  Dosing Weight: 60.4 kg  Vital Signs: Temp: 97.4 F (36.3 C) (10/26 1041) Temp Source: Oral (10/26 1041) BP: 132/63 (10/26 0649) Pulse Rate: 56 (10/26 0649)  Labs: Recent Labs    03/11/24 2114  HGB 9.6*  HCT 31.3*  PLT 188  CREATININE 3.41*    Estimated Creatinine Clearance: 16 mL/min (A) (by C-G formula based on SCr of 3.41 mg/dL (H)).   Medical History: Past Medical History:  Diagnosis Date   Anemia    as a younger man   Arthritis    hands   Cataract    Chronic kidney disease    starting to bother me now (07/28/2012)   Diabetes mellitus     type II   Diarrhea, functional    ED (erectile dysfunction)    Hypertension    Neuropathy    Peripheral neuropathy     Medications:  (Not in a hospital admission)  Scheduled:   amLODipine   2.5 mg Oral Daily   atorvastatin   40 mg Oral Daily   [START ON 03/13/2024] calcitRIOL   0.25 mcg Oral Q M,W,F   carvedilol   12.5 mg Oral BID WC   famotidine   20 mg Oral Daily   folic acid   1 mg Oral Daily   furosemide   40 mg Oral Daily   heparin  injection (subcutaneous)  5,000 Units Subcutaneous Q8H   insulin  aspart  0-15 Units Subcutaneous TID WC   insulin  glargine-yfgn  5 Units Subcutaneous QHS   Melatonin  10 mg Oral QHS   [START ON 03/13/2024] predniSONE   5 mg Oral Q breakfast   sevelamer  carbonate  800 mg Oral TID WC   tacrolimus   3 mg Oral BID   Infusions:   ceFEPime  (MAXIPIME ) IV     vancomycin      PRN:   Assessment: 73 yom with a history of renal transplant, PE on eliquis , HTN, HLD, T2DM, and recent admission for UTI. Patient is presenting with right great toe infection w/  ulceration. XR c/f osteomyelitis. Heparin  per pharmacy consult placed for Hx of VTE.  Patient is prescribed apixaban . Last dose per patient however is not in the last 2 weeks.   Hgb 9.6; plt 188  Goal of Therapy:  Heparin  level 0.3-0.7 units/ml aPTT 66-102 seconds Monitor platelets by anticoagulation protocol: Yes   Plan:  Will forego initial heparin  bolus in the event patient is incorrect regarding last eliquis  dose. Patient also already received SQ heparin  this morning. Will initially plan for aptt + heparin  level monitoring for rationale above, but may only require heparin  level monitoring. Start heparin  infusion at 1000 units/hr Check aPTT & anti-Xa level in 8 hours and daily while on heparin  Continue to monitor via aPTT until levels are correlated Continue to monitor H&H and platelets  Dorn Buttner, PharmD, BCPS 03/12/2024 11:10 AM ED Clinical Pharmacist -  [phone]

## 2024-03-13 ENCOUNTER — Encounter (HOSPITAL_COMMUNITY)
Admission: EM | Disposition: A | Discharge status: Home Health | Payer: Self-pay | Source: Home / Self Care | Attending: Internal Medicine

## 2024-03-13 DIAGNOSIS — I70235 Atherosclerosis of native arteries of right leg with ulceration of other part of foot: Secondary | ICD-10-CM | POA: Diagnosis not present

## 2024-03-13 DIAGNOSIS — I7092 Chronic total occlusion of artery of the extremities: Secondary | ICD-10-CM

## 2024-03-13 DIAGNOSIS — I70202 Unspecified atherosclerosis of native arteries of extremities, left leg: Secondary | ICD-10-CM

## 2024-03-13 DIAGNOSIS — L97519 Non-pressure chronic ulcer of other part of right foot with unspecified severity: Secondary | ICD-10-CM | POA: Diagnosis not present

## 2024-03-13 HISTORY — PX: ABDOMINAL AORTOGRAM W/LOWER EXTREMITY: CATH118223

## 2024-03-13 HISTORY — PX: LOWER EXTREMITY INTERVENTION: CATH118252

## 2024-03-13 HISTORY — PX: LOWER EXTREMITY ANGIOGRAPHY: CATH118251

## 2024-03-13 HISTORY — PX: PERIPHERAL INTRAVASCULAR LITHOTRIPSY: CATH118324

## 2024-03-13 LAB — CBC
HCT: 30.4 % — ABNORMAL LOW (ref 39.0–52.0)
Hemoglobin: 9.5 g/dL — ABNORMAL LOW (ref 13.0–17.0)
MCH: 27.6 pg (ref 26.0–34.0)
MCHC: 31.3 g/dL (ref 30.0–36.0)
MCV: 88.4 fL (ref 80.0–100.0)
Platelets: 207 K/uL (ref 150–400)
RBC: 3.44 MIL/uL — ABNORMAL LOW (ref 4.22–5.81)
RDW: 14.1 % (ref 11.5–15.5)
WBC: 6.7 K/uL (ref 4.0–10.5)
nRBC: 0 % (ref 0.0–0.2)

## 2024-03-13 LAB — GLUCOSE, CAPILLARY
Glucose-Capillary: 139 mg/dL — ABNORMAL HIGH (ref 70–99)
Glucose-Capillary: 111 mg/dL — ABNORMAL HIGH (ref 70–99)
Glucose-Capillary: 171 mg/dL — ABNORMAL HIGH (ref 70–99)
Glucose-Capillary: 188 mg/dL — ABNORMAL HIGH (ref 70–99)

## 2024-03-13 LAB — HEPARIN LEVEL (UNFRACTIONATED): Heparin Unfractionated: 0.38 [IU]/mL (ref 0.30–0.70)

## 2024-03-13 LAB — TACROLIMUS LEVEL: Tacrolimus (FK506) - LabCorp: 2 ng/mL — ABNORMAL LOW (ref 5.0–20.0)

## 2024-03-13 MED ORDER — SODIUM CHLORIDE 0.9% FLUSH
3.0000 mL | Freq: Two times a day (BID) | INTRAVENOUS | Status: DC
  Administered 2024-03-14 (×2): 3 mL via INTRAVENOUS

## 2024-03-13 MED ORDER — CLOPIDOGREL BISULFATE 300 MG PO TABS
ORAL_TABLET | ORAL | Status: DC | PRN
  Administered 2024-03-13: 300 mg via ORAL

## 2024-03-13 MED ORDER — CLOPIDOGREL BISULFATE 75 MG PO TABS
75.0000 mg | ORAL_TABLET | Freq: Every day | ORAL | Status: DC
  Administered 2024-03-14 – 2024-03-15 (×2): 75 mg via ORAL
  Filled 2024-03-13 (×2): qty 1

## 2024-03-13 MED ORDER — HEPARIN (PORCINE) IN NACL 1000-0.9 UT/500ML-% IV SOLN
INTRAVENOUS | Status: DC | PRN
  Administered 2024-03-13: 1000 mL

## 2024-03-13 MED ORDER — ACETAMINOPHEN 325 MG PO TABS: 650.0000 mg | ORAL_TABLET | ORAL | Status: DC | PRN

## 2024-03-13 MED ORDER — MIDAZOLAM HCL 2 MG/2ML IJ SOLN
INTRAMUSCULAR | Status: AC
  Filled 2024-03-13: qty 2

## 2024-03-13 MED ORDER — SODIUM CHLORIDE 0.9 % IV SOLN: 250.0000 mL | INTRAVENOUS | Status: AC | PRN

## 2024-03-13 MED ORDER — MIDAZOLAM HCL (PF) 2 MG/2ML IJ SOLN
INTRAMUSCULAR | Status: DC | PRN
  Administered 2024-03-13: 1 mg via INTRAVENOUS

## 2024-03-13 MED ORDER — FENTANYL CITRATE (PF) 100 MCG/2ML IJ SOLN
INTRAMUSCULAR | Status: DC | PRN
  Administered 2024-03-13: 25 ug via INTRAVENOUS
  Administered 2024-03-13: 50 ug via INTRAVENOUS

## 2024-03-13 MED ORDER — SODIUM CHLORIDE 0.9 % WEIGHT BASED INFUSION: 1.0000 mL/kg/h | INTRAVENOUS | Status: AC

## 2024-03-13 MED ORDER — LIDOCAINE HCL (PF) 1 % IJ SOLN
INTRAMUSCULAR | Status: DC | PRN
  Administered 2024-03-13: 10 mL via INTRADERMAL

## 2024-03-13 MED ORDER — HEPARIN SODIUM (PORCINE) 1000 UNIT/ML IJ SOLN
INTRAMUSCULAR | Status: DC | PRN
  Administered 2024-03-13: 6000 [IU] via INTRAVENOUS

## 2024-03-13 MED ORDER — FENTANYL CITRATE (PF) 100 MCG/2ML IJ SOLN
INTRAMUSCULAR | Status: AC
  Filled 2024-03-13: qty 2

## 2024-03-13 MED ORDER — ASPIRIN 81 MG PO CHEW
CHEWABLE_TABLET | ORAL | Status: AC
Start: 2024-03-13 — End: 2024-03-13
  Filled 2024-03-13: qty 1

## 2024-03-13 MED ORDER — OXYCODONE HCL 5 MG PO TABS: 5.0000 mg | ORAL_TABLET | ORAL | Status: DC | PRN

## 2024-03-13 MED ORDER — HEPARIN SODIUM (PORCINE) 1000 UNIT/ML IJ SOLN
INTRAMUSCULAR | Status: AC
  Filled 2024-03-13: qty 10

## 2024-03-13 MED ORDER — CLOPIDOGREL BISULFATE 300 MG PO TABS
ORAL_TABLET | ORAL | Status: AC
  Filled 2024-03-13: qty 1

## 2024-03-13 MED ORDER — ASPIRIN 81 MG PO TBEC
81.0000 mg | DELAYED_RELEASE_TABLET | Freq: Every day | ORAL | Status: DC
  Administered 2024-03-13 – 2024-03-15 (×3): 81 mg via ORAL
  Filled 2024-03-13 (×3): qty 1

## 2024-03-13 MED ORDER — LABETALOL HCL 5 MG/ML IV SOLN: 10.0000 mg | INTRAVENOUS | Status: DC | PRN

## 2024-03-13 MED ORDER — SODIUM CHLORIDE 0.9% FLUSH: 3.0000 mL | INTRAVENOUS | Status: DC | PRN

## 2024-03-13 MED ORDER — IODIXANOL 320 MG/ML IV SOLN
INTRAVENOUS | Status: DC | PRN
  Administered 2024-03-13: 75 mL via INTRA_ARTERIAL

## 2024-03-13 MED ORDER — HYDRALAZINE HCL 20 MG/ML IJ SOLN: 5.0000 mg | INTRAMUSCULAR | Status: DC | PRN

## 2024-03-13 MED ORDER — ASPIRIN 81 MG PO CHEW
CHEWABLE_TABLET | ORAL | Status: DC | PRN
  Administered 2024-03-13: 81 mg via ORAL

## 2024-03-13 NOTE — Progress Notes (Signed)
 PROGRESS NOTE [name] [name]  FMW:[id number] DOB: 24-Mar-1951 DOA: 03/11/2024 PCP: Theophilus Andrews, Tully GRADE, MD  Brief Narrative/Hospital Course: [name] [name] is a 73 y.o. male with PMH of  renal transplant on immunosuppression therapy, PE on Eliquis , HTN, HLD, DM2, and recent admission in 01/2024 for Proteus/Klebsiella UTI treated with PO cefadroxil  for 5 days who p/w R great toe diabetic foot infection c/b open ulceration and XR c/f osteomyelitis.     The patient presented with a problem involving the right big toe. The issue began on Wednesday with pain in the area, but there was no redness noted in the entire foot. The situation progressed to where a spot on the toe opened up and began bleeding. There was no drainage or pus reported. The patient reported severe pain, especially when standing, which led to an inability to walk. The patient did not attempt any treatment at home due to difficulty in accessing the area. The decision to seek hospital care was made when the patient could no longer bear weight on the foot.   In the ED, the pt AFVSS. Labs notable for Cr 3.41 (which is baseline), and lactic acid 0.7. XR R foot showed erosion of the first distal phalanx tuft consistent with osteomyelitis. EDP started IV Zosyn , consulted Podiatry/Ortho who agreed to eval pt later today and requested medicine admission.  Subjective: Seen and examined today Overnight on RA, afebrile, VSS, Labs blood sugar 130s, last BUN 45 creatinine 3.7 hemoglobin 9.5 Underwent angiography FOR right great toe wound currently in the holding area Denies any nausea vomiting chest pain fever chills.  Resting comfortably, eager for surgery tomorrow  Assessment and plan:  Right first toe ulceration with associated osteomyelitis in the setting of diabetes mellitus/PVD: On admission had x-ray, and is started on vancomycin  and cefepime , blood culture sent  underwent angiography this morning, maximally revascularized on  the right with fine flow to the foot via the AT with lithotripsy, balloon angioplasty Podiatry following plan for impression of the right great toe 10/28 okay to continue anticoagulation per vascular  CKD stage IV/V , s/p renal transplant: Renal function remained stable at baseline, continue his prednisone , Prograf  Recent Labs    01/18/24 0542 01/19/24 0231 01/20/24 0215 01/21/24 0817 01/22/24 0204 01/23/24 0147 03/11/24 2114 03/12/24 1226  BUN 69* 65* 57* 53* 50* 47* 45*  --   CREATININE 4.39* 4.27* 4.01* 3.79* 3.79* 3.68* 3.41* 3.72*  CO2 18* 19* 20* 20* 20* 20* 20*  --   K 4.2 3.9 4.4 4.1 4.7 4.6 4.8  --     PE on Eliquis  PTA Eliquis  - currently on hold and now on heparin     HTN Stable on Coreg  and lasix    HLD Cont atorvastatin     DM2 A1c 8.3 in 01/2024. Cont Semglee  5U nightly + SSI TID AC prn Recent Labs  Lab 03/12/24 1610 03/12/24 1734 03/12/24 2201 03/13/24 0804 03/13/24 1140  GLUCAP 209* 115* 112* 139* 111*      Moderate malnutrition with/ Body mass index is 19.11 kg/m.: Will benefit dietary augmentation.    DVT prophylaxis: Heparin  drip Code Status:   Code Status: Full Code Family Communication: plan of care discussed with patient at bedside. Patient status is: Remains hospitalized because of severity of illness Level of care: Med-Surg   Dispo: The patient is from: home            Anticipated disposition: TBD Objective: Vitals last 24 hrs: Vitals:   03/13/24 1130 03/13/24 1135 03/13/24 1140  03/13/24 1145  BP: 135/67 139/70 (!) 140/65 (!) 134/57  Pulse: 70 72 72 63  Resp: 11 18 13 13   Temp:      TempSrc:      SpO2: 100% 100% 100% 100%  Weight:      Height:        Physical Examination: General exam: alert awake, oriented, older than stated age HEENT:Oral mucosa moist, Ear/Nose WNL grossly Respiratory system: Bilaterally clear BS,no use of accessory muscle Cardiovascular system: S1 & S2 +, No JVD. Gastrointestinal system: Abdomen  soft,NT,ND, BS+ Nervous System: Alert, awake, moving all extremities,and following commands. Extremities: extremities warm, leg edema neg, right extremity with dressing in place. Skin: Warm, no rashes MSK: Normal muscle bulk,tone, power   Medications reviewed:  Scheduled Meds:  [MAR Hold] amLODipine   2.5 mg Oral Daily   [MAR Hold] atorvastatin   40 mg Oral Daily   [MAR Hold] calcitRIOL   0.25 mcg Oral Q M,W,F   [MAR Hold] carvedilol   12.5 mg Oral BID WC   [MAR Hold] famotidine   20 mg Oral Daily   [MAR Hold] folic acid   1 mg Oral Daily   [MAR Hold] furosemide   40 mg Oral Daily   [MAR Hold] insulin  aspart  0-15 Units Subcutaneous TID WC   [MAR Hold] insulin  glargine-yfgn  5 Units Subcutaneous QHS   [MAR Hold] melatonin  10 mg Oral QHS   [MAR Hold] predniSONE   5 mg Oral Q breakfast   [MAR Hold] sevelamer  carbonate  800 mg Oral TID WC   [MAR Hold] tacrolimus   3 mg Oral BID   Continuous Infusions:  sodium chloride      [MAR Hold] ceFEPime  (MAXIPIME ) IV Stopped (03/12/24 1652)   heparin  800 Units/hr (03/13/24 0313)   [MAR Hold] vancomycin      Diet: Diet Order             Diet regular Room service appropriate? Yes; Fluid consistency: Thin  Diet effective now                    Data Reviewed: I have personally reviewed following labs and imaging studies ( see epic result tab) CBC: Recent Labs  Lab 03/11/24 2114 03/12/24 1226 03/13/24 0449  WBC 9.7 6.6 6.7  NEUTROABS 8.1*  --   --   HGB 9.6* 9.9* 9.5*  HCT 31.3* 32.1* 30.4*  MCV 90.2 89.2 88.4  PLT 188 191 207   CMP: Recent Labs  Lab 03/11/24 2114 03/12/24 1226  NA 139  --   K 4.8  --   CL 104  --   CO2 20*  --   GLUCOSE 84  --   BUN 45*  --   CREATININE 3.41* 3.72*  CALCIUM  9.3  --    GFR: Estimated Creatinine Clearance: 14.7 mL/min (A) (by C-G formula based on SCr of 3.72 mg/dL (H)). Recent Labs  Lab 03/11/24 2114  AST 13*  ALT 8  ALKPHOS 31*  BILITOT 0.9  PROT 6.9  ALBUMIN 3.5   No results for  input(s): LIPASE, AMYLASE in the last 168 hours. No results for input(s): AMMONIA in the last 168 hours. Coagulation Profile: No results for input(s): INR, PROTIME in the last 168 hours. Unresulted Labs (From admission, onward)     Start     Ordered   03/19/24 0500  Creatinine, serum  (enoxaparin  (LOVENOX )    CrCl >/= 30 ml/min)  Weekly,   R     Comments: while on enoxaparin  therapy    03/12/24  9145   03/14/24 0500  Heparin  level (unfractionated)  Daily,   R      03/12/24 2137   03/13/24 0500  CBC  Daily,   R      03/12/24 2137   03/12/24 1052  Tacrolimus  level  Once,   R        03/12/24 1051   Signed and Held  Lipid panel  (Labs - [place] only)  Tomorrow morning,   R        Signed and Held           Antimicrobials/Microbiology: Anti-infectives (From admission, onward)    Start     Dose/Rate Route Frequency Ordered Stop   03/14/24 1300  [MAR Hold]  vancomycin  (VANCOREADY) IVPB 750 mg/150 mL        (MAR Hold since Mon 03/13/2024 at 0919.Hold Reason: Transfer to a Procedural area)   750 mg 150 mL/hr over 60 Minutes Intravenous Every 48 hours 03/12/24 1143     03/12/24 1700  [MAR Hold]  ceFEPIme  (MAXIPIME ) 2 g in sodium chloride  0.9 % 100 mL IVPB        (MAR Hold since Mon 03/13/2024 at 0919.Hold Reason: Transfer to a Procedural area)   2 g 200 mL/hr over 30 Minutes Intravenous Every 24 hours 03/12/24 1109     03/12/24 1115  vancomycin  (VANCOREADY) IVPB 1250 mg/250 mL        1,250 mg 166.7 mL/hr over 90 Minutes Intravenous  Once 03/12/24 1109 03/12/24 1501   03/12/24 0800  piperacillin -tazobactam (ZOSYN ) IVPB 3.375 g        3.375 g 100 mL/hr over 30 Minutes Intravenous  Once 03/12/24 0748 03/12/24 1029         Component Value Date/Time   SDES BLOOD LEFT WRIST 03/12/2024 0754   SPECREQUEST  03/12/2024 0754    BOTTLES DRAWN AEROBIC AND ANAEROBIC Blood Culture results may not be optimal due to an inadequate volume of blood received in culture bottles   CULT   03/12/2024 0754    NO GROWTH < 24 HOURS Performed at [facility], [address] [place], [place] [address]    REPTSTATUS PENDING 03/12/2024 9245    Procedures: Procedure(s) (LRB): Lower Extremity Angiography (Right) PERIPHERAL INTRAVASCULAR LITHOTRIPSY LOWER EXTREMITY INTERVENTION ABDOMINAL AORTOGRAM W/LOWER EXTREMITY (N/A)   Mennie LAMY, MD [facility] 03/13/2024, 1:57 PM

## 2024-03-13 NOTE — Plan of Care (Signed)
  Problem: Education: Goal: Individualized Educational Video(s) Outcome: Progressing   

## 2024-03-13 NOTE — Progress Notes (Addendum)
 ANTICOAGULATION CONSULT NOTE  Pharmacy Consult for Heparin  Indication: Hx of VTE  Allergies  Allergen Reactions   Other Rash    Oxyquinoline-white Pet-lanolin   Aquamed Rash   Bag Balm  [Albolene] Rash   Cold Cream Rash    Patient Measurements: Height: 5' 10 (177.8 cm) Weight: 60.4 kg (133 lb 2.5 oz) IBW/kg (Calculated) : 73 Heparin  Dosing Weight: 60.4 kg  Vital Signs: Temp: 98.6 F (37 C) (10/27 0800) Temp Source: Oral (10/27 0800) BP: 166/71 (10/27 0800) Pulse Rate: 65 (10/27 0800)  Labs: Recent Labs    03/11/24 2114 03/12/24 1226 03/12/24 2048 03/13/24 0449 03/13/24 0803  HGB 9.6* 9.9*  --  9.5*  --   HCT 31.3* 32.1*  --  30.4*  --   PLT 188 191  --  207  --   APTT  --   --  160*  --   --   HEPARINUNFRC  --   --  0.75*  --  0.38  CREATININE 3.41* 3.72*  --   --   --     Estimated Creatinine Clearance: 14.7 mL/min (A) (by C-G formula based on SCr of 3.72 mg/dL (H)).   Medical History: Past Medical History:  Diagnosis Date   Anemia    as a younger man   Arthritis    hands   Cataract    Chronic kidney disease    starting to bother me now (07/28/2012)   Diabetes mellitus     type II   Diarrhea, functional    ED (erectile dysfunction)    Hypertension    Neuropathy    Peripheral neuropathy     Medications:  Medications Prior to Admission  Medication Sig Dispense Refill Last Dose/Taking   amLODipine  (NORVASC ) 2.5 MG tablet Take 2.5 mg by mouth daily.   03/10/2024   aspirin  EC 81 MG tablet Take 81 mg by mouth daily.   03/10/2024   atorvastatin  (LIPITOR) 40 MG tablet Take 1 tablet (40 mg total) by mouth daily. 30 tablet 0 03/10/2024   BAQSIMI ONE PACK 3 MG/DOSE POWD Place 1 spray into the nose daily as needed for low blood sugar.   Unknown   calcitRIOL  (ROCALTROL ) 0.25 MCG capsule Take 0.25 mcg by mouth every Monday, Wednesday, and Friday.   03/10/2024   carvedilol  (COREG ) 12.5 MG tablet Take 12.5 mg by mouth 2 (two) times daily with a meal.    03/10/2024   famotidine  (PEPCID ) 20 MG tablet Take 20 mg by mouth daily.   03/10/2024   folic acid  (FOLVITE ) 1 MG tablet Take 1 mg by mouth daily.   03/10/2024   furosemide  (LASIX ) 40 MG tablet Take 40 mg by mouth daily.   03/10/2024   insulin  glargine (LANTUS  SOLOSTAR) 100 UNIT/ML Solostar Pen Inject 6 Units into the skin at bedtime.   Past Week   insulin  lispro (HUMALOG KWIKPEN) 100 UNIT/ML KwikPen Inject 0-15 Units into the skin 3 (three) times daily as needed (high blood sugar).   Unknown   Melatonin 10 MG TABS Take 10 mg by mouth at bedtime.   Past Week   predniSONE  (DELTASONE ) 5 MG tablet Take 5 mg by mouth daily with breakfast.   03/10/2024   Semaglutide,0.25 or 0.5MG /DOS, 2 MG/3ML SOPN Inject 0.25 mg into the skin once a week. Sundays   03/05/2024   tacrolimus  (PROGRAF ) 1 MG capsule Take 3 mg by mouth 2 (two) times daily.   03/10/2024   thiamine  100 MG tablet Take 100 mg by mouth  daily.   03/10/2024   apixaban  (ELIQUIS ) 5 MG TABS tablet Take 5 mg by mouth 2 (two) times daily. (Patient not taking: Reported on 03/12/2024)   Not Taking   cefadroxil  (DURICEF) 500 MG capsule Take 1 capsule (500 mg total) by mouth daily. X 2 more days (Patient not taking: Reported on 01/25/2024) 2 capsule 0 Not Taking   sevelamer  carbonate (RENVELA ) 800 MG tablet  (Patient not taking: Reported on 03/12/2024)   Not Taking   Scheduled:   [MAR Hold] amLODipine   2.5 mg Oral Daily   [MAR Hold] atorvastatin   40 mg Oral Daily   [MAR Hold] calcitRIOL   0.25 mcg Oral Q M,W,F   [MAR Hold] carvedilol   12.5 mg Oral BID WC   [MAR Hold] famotidine   20 mg Oral Daily   [MAR Hold] folic acid   1 mg Oral Daily   [MAR Hold] furosemide   40 mg Oral Daily   [MAR Hold] insulin  aspart  0-15 Units Subcutaneous TID WC   [MAR Hold] insulin  glargine-yfgn  5 Units Subcutaneous QHS   [MAR Hold] melatonin  10 mg Oral QHS   [MAR Hold] predniSONE   5 mg Oral Q breakfast   [MAR Hold] sevelamer  carbonate  800 mg Oral TID WC   [MAR Hold]  tacrolimus   3 mg Oral BID   Infusions:   [MAR Hold] ceFEPime  (MAXIPIME ) IV Stopped (03/12/24 1652)   heparin  800 Units/hr (03/13/24 0313)   [MAR Hold] vancomycin      PRN:   Assessment: 73 yom with a history of renal transplant, PE on eliquis , HTN, HLD, T2DM, and recent admission for UTI. Patient is presenting with right great toe infection w/ ulceration. XR c/f osteomyelitis. Heparin  per pharmacy consult placed for Hx of VTE.  Patient is prescribed apixaban . Last dose per patient however is not in the last 2 weeks.   Heparin  level therapeutic at 0.38 this AM after dose decrease last night. Patient taken to cath lab with vascular for revascularization, will follow up plan after procedure. Heparin  paused upon transport.   Goal of Therapy:  Heparin  level 0.3-0.7 units/ml aPTT 66-102 seconds Monitor platelets by anticoagulation protocol: Yes   Plan:  Continue heparin  to 800 units/h Follow-up after re-vascularization procedure for confirmatory heparin  level   Rankin Sams, PharmD, BCPS, BCCCP Clinical Pharmacist

## 2024-03-13 NOTE — Op Note (Signed)
 Patient name: [name] [name] MRN: [id number] DOB: [birth year] Sex: male  03/13/2024 Pre-operative Diagnosis: CLTI with right great toe wound Post-operative diagnosis:  Same Surgeon:  Norman GORMAN Serve, MD Procedure Performed:  Ultrasound-guided access of left common artery Aortogram bilateral lower extremity angiogram Third order cannulation of right AT Intravascular lithotripsy of right AT, 3 mm shockwave E8 Balloon angioplasty of right AT, 3 mm coyote Mynx closure of left common femoral artery 83 minutes of moderate sedation with fentanyl  and Versed     Indications: 73 year old male who was admitted with right great toe wound and PAD.  He did not have palpable pedal pulses and had ischemic changes of the right foot associated with the wound.  Angiogram was offered, risks and benefits reviewed and he elected to proceed.  Findings:  Widely patent aorta and bilateral renal arteries.  Widely patent iliac systems bilaterally.  Right common femoral, SFA and profunda are patent without flow-limiting stenosis.  The popliteal artery is patent without flow-limiting stenosis.  The PT is chronically occluded.  The AT is patent for a short segment but then occludes and is reconstituted which fills the DP on the foot.  The peroneal is diseased and occludes in its proximal segment.  Left common femoral, SFA and profunda are patent without flow-limiting stenosis.  The popliteal artery is patent without flow-limiting stenosis.  There is significant tibial disease in all 3 vessels with reconstitution distally.   Procedure:  The patient was identified in the holding area and taken to the cath lab  The patient was then placed supine on the table and prepped and draped in the usual sterile fashion.  A time out was called.  Ultrasound was used to evaluate the left common femoral artery.  It was patent .  A digital ultrasound image was acquired.  A micropuncture needle was used to access the left common  femoral artery under ultrasound guidance.  An 018 wire was advanced without resistance and a micropuncture sheath was placed.  The 018 wire was removed and a benson wire was placed.  The micropuncture sheath was exchanged for a 5 french sheath.  An omniflush catheter was advanced over the wire to the level of L-1.  An abdominal CO2 angiogram was obtained.  Next, using the omniflush catheter and a glide advantage wire, the aortic bifurcation was crossed and the catheter was placed into theright external iliac artery and right runoff was obtained. This demonstrated the above findings.  The glide advantage wire was then replaced through the catheter and into the SFA.  The 5 French sheath was then exchanged for 6 French by 45 cm catapult sheath and the patient was systemically heparinized.  Using an 014 command wire and CXI catheter I was able to navigate into the AT and the heavily diseased AT was crossed and a wire was placed into the distal AT near the ankle.  An angiogram.  The catheter demonstrated true lumen access distally.  I then performed multiple rounds of intravascular lithotripsy with the 3 mm shockwave E8 balloon at the proximal portion of the AT.  It should be noted that the balloon would not track past the first 15 cm due to diffuse disease.  And then attempted to predilate the mid and distal segment of the AT with a 2 mm coyote balloon.  At the shockwave balloon still would not track into the distal segment of the AT and therefore I used a 3 mm x 220 coyote balloon to treat the entire  AT.  A short segment in the distal aspect was restenotic and therefore a 3 mm x 80 mm balloon was used in this area.  Completion angiogram demonstrated a good result with inline flow to the foot.  The wire was removed and replaced with a Bentson wire.  The long 6 French sheath was exchanged for a short 6 French sheath and left runoff was performed via retrograde sheath injections which demonstrated the above findings.  A  Mynx closure device was then deployed with excellent hemostasis.  Contrast: 75 cc Sedation: 83 minutes  Impression: Maximally revascularized on the right with inline flow to the foot via the AT.   Norman GORMAN Serve MD [facility] Office: [phone]

## 2024-03-13 NOTE — Consult Note (Signed)
 Hospital Consult    Reason for Consult: Right great toe osteomyelitis Requesting Physician: Podiatry MRN #:  [id number]  History of Present Illness: This is a 73 y.o. male who presents due to worsening right great toe wound.  On exam, [name] [name] was resting comfortably.  A native of [place], he is now retired after working for [employer] for over 15 years.  He has been married for 55 years, and has children and grandchildren.  He currently lives with his wife, daughter, grandkids.  [name] [name] has a history of end-stage renal disease status post transplant at [facility] 5 years ago.  Kidney function has been slowly declining.  The right great toe wounds been present for a number of months, but has worsened now with bone open to air.  [name] denies fevers, chills.  Past Medical History:  Diagnosis Date   Anemia    as a younger man   Arthritis    hands   Cataract    Chronic kidney disease    starting to bother me now (07/28/2012)   Diabetes mellitus     type II   Diarrhea, functional    ED (erectile dysfunction)    Hypertension    Neuropathy    Peripheral neuropathy     Past Surgical History:  Procedure Laterality Date   APPENDECTOMY  1950's   AV FISTULA PLACEMENT Right 05/14/2016   Procedure: ARTERIOVENOUS (AV) FISTULA CREATION- RIGHT ARM;  Surgeon: Gaile LELON New, MD;  Location: [facility] OR;  Service: Vascular;  Laterality: Right;   COLONOSCOPY     KIDNEY TRANSPLANT Right    PROSTATE SURGERY     UPPER GASTROINTESTINAL ENDOSCOPY      Allergies  Allergen Reactions   Other Rash    Oxyquinoline-white Pet-lanolin   Aquamed Rash   Bag Balm  [Albolene] Rash   Cold Cream Rash    Prior to Admission medications   Medication Sig Start Date End Date Taking? Authorizing Provider  amLODipine  (NORVASC ) 2.5 MG tablet Take 2.5 mg by mouth daily. 08/16/21  Yes [provider]  aspirin  EC 81 MG tablet Take 81 mg by mouth daily. 02/06/24 02/05/25 Yes [provider]   atorvastatin  (LIPITOR) 40 MG tablet Take 1 tablet (40 mg total) by mouth daily. 01/24/24  Yes Elpidio Reyes DEL, MD  BAQSIMI ONE PACK 3 MG/DOSE POWD Place 1 spray into the nose daily as needed for low blood sugar. 05/13/21  Yes [provider]  calcitRIOL  (ROCALTROL ) 0.25 MCG capsule Take 0.25 mcg by mouth every Monday, Wednesday, and Friday. 11/22/23 11/21/24 Yes [provider]  carvedilol  (COREG ) 12.5 MG tablet Take 12.5 mg by mouth 2 (two) times daily with a meal.   Yes [provider]  famotidine  (PEPCID ) 20 MG tablet Take 20 mg by mouth daily. 09/12/20  Yes [provider]  folic acid  (FOLVITE ) 1 MG tablet Take 1 mg by mouth daily. 09/12/20  Yes [provider]  furosemide  (LASIX ) 40 MG tablet Take 40 mg by mouth daily. 07/19/21  Yes [provider]  insulin  glargine (LANTUS  SOLOSTAR) 100 UNIT/ML Solostar Pen Inject 6 Units into the skin at bedtime.   Yes [provider]  insulin  lispro (HUMALOG KWIKPEN) 100 UNIT/ML KwikPen Inject 0-15 Units into the skin 3 (three) times daily as needed (high blood sugar).   Yes [provider]  Melatonin 10 MG TABS Take 10 mg by mouth at bedtime.   Yes [provider]  predniSONE  (DELTASONE ) 5 MG tablet Take 5 mg by  mouth daily with breakfast.   Yes [provider]  Semaglutide,0.25 or 0.5MG /DOS, 2 MG/3ML SOPN Inject 0.25 mg into the skin once a week. Sundays 08/16/23  Yes [provider]  tacrolimus  (PROGRAF ) 1 MG capsule Take 3 mg by mouth 2 (two) times daily.   Yes [provider]  thiamine  100 MG tablet Take 100 mg by mouth daily. 09/11/20  Yes [provider]  apixaban  (ELIQUIS ) 5 MG TABS tablet Take 5 mg by mouth 2 (two) times daily. Patient not taking: Reported on 03/12/2024    [provider]  cefadroxil  (DURICEF) 500 MG capsule Take 1 capsule (500 mg total) by mouth daily. X 2 more days Patient not taking: Reported on 01/25/2024 01/24/24    Elpidio Reyes DEL, MD  sevelamer  carbonate (RENVELA ) 800 MG tablet  10/11/17   [provider]    Social History   Socioeconomic History   Marital status: Married    Spouse name: Not on file   Number of children: 3   Years of education: Not on file   Highest education level: Associate degree: academic program  Occupational History    Employer: [employer]    Comment: [employer]  Tobacco Use   Smoking status: Former    Current packs/day: 0.00    Average packs/day: 0.1 packs/day for 10.0 years (1.2 ttl pk-yrs)    Types: Cigarettes    Start date: 04/27/1976    Quit date: 04/27/1986    Years since quitting: 37.9   Smokeless tobacco: Never   Tobacco comments:    07/28/2012 quit smoking cigarettes 20-30 yr ago  Vaping Use   Vaping status: Never Used  Substance and Sexual Activity   Alcohol use: Not Currently    Alcohol/week: 1.0 standard drink of alcohol    Types: 1 Cans of beer per week    Comment: occ   Drug use: No   Sexual activity: Not Currently    Partners: Male  Other Topics Concern   Not on file  Social History Narrative   Not on file   Social Drivers of Health   Financial Resource Strain: Low Risk  (11/23/2023)   Received from [facility]   Overall Financial Resource Strain (CARDIA)    Difficulty of Paying Living Expenses: Not hard at all  Food Insecurity: No Food Insecurity (03/12/2024)   Hunger Vital Sign    Worried About Running Out of Food in the Last Year: Never true    Ran Out of Food in the Last Year: Never true  Transportation Needs: No Transportation Needs (03/12/2024)   PRAPARE - Administrator, Civil Service (Medical): No    Lack of Transportation (Non-Medical): No  Physical Activity: Sufficiently Active (08/05/2022)   Exercise Vital Sign    Days of Exercise per Week: 3 days    Minutes of Exercise per Session: 50 min  Stress: No Stress Concern Present (08/05/2022)   [employer] of  Occupational Health - Occupational Stress Questionnaire    Feeling of Stress : Not at all  Social Connections: Socially Integrated (03/12/2024)   Social Connection and Isolation Panel    Frequency of Communication with Friends and Family: More than three times a week    Frequency of Social Gatherings with Friends and Family: More than three times a week    Attends Religious Services: More than 4 times per year    Active Member of [employer] or Organizations: Yes    Attends Club or  Organization Meetings: More than 4 times per year    Marital Status: Married  Catering Manager Violence: Not At Risk (03/12/2024)   Humiliation, Afraid, Rape, and Kick questionnaire    Fear of Current or Ex-Partner: No    Emotionally Abused: No    Physically Abused: No    Sexually Abused: No   Family History  Problem Relation Age of Onset   Colon polyps Mother    Stroke Mother    Diabetes Father    Diabetes Cousin    Prostate cancer Brother    Other Daughter        kidney problems   Esophageal cancer Maternal Aunt    Colon cancer Neg Hx    Stomach cancer Neg Hx    Rectal cancer Neg Hx     ROS: Otherwise negative unless mentioned in HPI  Physical Examination  Vitals:   03/12/24 1615 03/12/24 1644  BP: (!) 110/59   Pulse: 72   Resp: 18   Temp:  (!) 97.4 F (36.3 C)  SpO2: 100%    Body mass index is 19.11 kg/m.  General:  WDWN in NAD Gait: Not observed HENT: WNL, normocephalic Pulmonary: normal non-labored breathing, without Rales, rhonchi,  wheezing Cardiac: regular Abdomen:  soft, NT/ND, no masses Skin: without rashes Vascular Exam/Pulses: 2+ femoral Extremities: + ischemic changes, without Gangrene , without cellulitis; with open wounds;  Musculoskeletal: no muscle wasting or atrophy  Neurologic: A&O X 3;  No focal weakness or paresthesias are detected; speech is fluent/normal Psychiatric:  The pt has Normal affect. Lymph:  Unremarkable  CBC    Component Value Date/Time   WBC 6.7  03/13/2024 0449   RBC 3.44 (L) 03/13/2024 0449   HGB 9.5 (L) 03/13/2024 0449   HCT 30.4 (L) 03/13/2024 0449   PLT 207 03/13/2024 0449   MCV 88.4 03/13/2024 0449   MCH 27.6 03/13/2024 0449   MCHC 31.3 03/13/2024 0449   RDW 14.1 03/13/2024 0449   LYMPHSABS 0.6 (L) 03/11/2024 2114   MONOABS 0.9 03/11/2024 2114   EOSABS 0.0 03/11/2024 2114   BASOSABS 0.0 03/11/2024 2114    BMET    Component Value Date/Time   NA 139 03/11/2024 2114   NA 139 09/16/2016 0000   K 4.8 03/11/2024 2114   CL 104 03/11/2024 2114   CO2 20 (L) 03/11/2024 2114   GLUCOSE 84 03/11/2024 2114   BUN 45 (H) 03/11/2024 2114   BUN 70 (A) 09/16/2016 0000   CREATININE 3.72 (H) 03/12/2024 1226   CALCIUM  9.3 03/11/2024 2114   GFRNONAA 16 (L) 03/12/2024 1226   GFRAA 41 (L) 01/16/2020 1944    COAGS: Lab Results  Component Value Date   INR 1.2 01/16/2020   INR 1.1 06/14/2019      ASSESSMENT/PLAN: This is a 73 y.o. male with right lower extremity critical ischemia with tissue loss of the great toe.  He has nonpalpable pulses in the feet. The wound is nonhealing, has been present for several months.  I had a nice conversation with Jentry regarding the above, and the need for revascularization.  He is aware that his kidney is slowly declining, and he is not far from dialysis.  He is also aware that intervention will require the use of contrast which could have placed him on dialysis.  After discussing the risks and benefits of this, Zuri elected to proceed.  Will schedule for today.  Please keep NPO.   Fonda FORBES Rim MD MS [facility] [phone] 03/13/2024  7:22 AM

## 2024-03-13 NOTE — Hospital Course (Addendum)
 [name] [name] is a 73 y.o. male with PMH of  renal transplant on immunosuppression therapy, PE on Eliquis , HTN, HLD, DM2, and recent admission in 01/2024 for Proteus/Klebsiella UTI treated with PO cefadroxil  for 5 days who p/w R great toe diabetic foot infection c/b open ulceration and XR c/f osteomyelitis.     The patient presented with a problem involving the right big toe. The issue began on Wednesday with pain in the area, but there was no redness noted in the entire foot. The situation progressed to where a spot on the toe opened up and began bleeding. There was no drainage or pus reported. The patient reported severe pain, especially when standing, which led to an inability to walk. The patient did not attempt any treatment at home due to difficulty in accessing the area. The decision to seek hospital care was made when the patient could no longer bear weight on the foot. In the ED, the pt AFVSS. Labs notable for Cr 3.41 (which is baseline), and lactic acid 0.7. XR R foot showed erosion of the first distal phalanx tuft consistent with osteomyelitis. EDP started IV Zosyn , consulted Podiatry/Ortho who agreed to eval pt later today and requested medicine admission. s/p angiography 10/27-maximally revascularized on the right with good flow to the foot via the AT with lithotripsy, balloon angioplasty- s/p  right great toe amputation 10/28 am Now on aspirin  Plavix per vascular-he was no longer on Eliquis  PTA and was only on aspirin  81 Discussed with podiatry For discharge home keep the dressing on until seen in the clinic follow-up in next week Seen by PT OT, patient is eager to go home today  Subjective: Seen and examined Overnight afebrile on room air BP fairly stable Labs reviewed creatinine about the same 3.6, hemoglobin drifting 9.5> 8.8> 8.3  Discharge Diagnoses:   Right first toe ulceration with associated osteomyelitis in the setting of diabetes mellitus/PVD: On admission had x-ray,  and is started on vancomycin  and cefepime , blood culture sent- NGTD. s/p angiography 10/27-maximally revascularized on the right with good flow to the foot via the AT with lithotripsy, balloon angioplasty- s/p  right great toe amputation 10/28 am Now on aspirin  Plavix per vascular-he was no longer on Eliquis  PTA and was only on aspirin  81 Continue multimodal pain management, dressing per podiatry. Plan for discharge once okay with podiatry Plan to change to Augmentin upon discharge to complete 5 days course  CKD stage IV/V , s/p renal transplant: Renal function remained stable at baseline, continue his prednisone , Prograf  and monitor-follow-up with outpatient nephrology Recent Labs    01/18/24 0542 01/19/24 0231 01/20/24 0215 01/21/24 9182 01/22/24 9795 01/23/24 0147 03/11/24 2114 03/12/24 1226 03/14/24 1557 03/15/24 0418  BUN 69* 65* 57* 53* 50* 47* 45*  --  51* 52*  CREATININE 4.39* 4.27* 4.01* 3.79* 3.79* 3.68* 3.41* 3.72* 3.57* 3.63*  CO2 18* 19* 20* 20* 20* 20* 20*  --  19* 19*  K 4.2 3.9 4.4 4.1 4.7 4.6 4.8  --  5.3* 4.9    PE hxL Previously on Eliquis .Patient was seen by his [facility] hematology on January 25 2024 and advised to stop Eliquis  and changed to aspirin .  Patient was not taking Eliquis  PTA. Now on DAPT for PVD and postprocedure   HTN Stable cont Coreg  and lasix    HLD Cont atorvastatin     DM2 with uncontrolled hyperglycemia A1c 8.3 in 01/2024.  Continue Semglee  and adjust continue SSI.  Resume home regimen upon discharge Recent Labs  Lab  03/14/24 0801 03/14/24 1153 03/14/24 1517 03/14/24 2200 03/15/24 0730  GLUCAP 189* 196* 243* 275* 143*     Moderate malnutrition with/ Body mass index is 19.11 kg/m.: Will benefit dietary augmentation.    Mobility: consult PTOT PT Orders:  PT Follow up Rec:     DVT prophylaxis: heparin  injection 5,000 Units Start: 03/14/24 2200 SCD's Start: 03/13/24 2113Heparin drip Code Status:   Code Status: Full Code Family  Communication: plan of care discussed with patient at bedside. Patient status is: Remains hospitalized because of severity of illness Level of care: Progressive Cardiac   Dispo: The patient is from: home            Anticipated disposition: Home today  Objective: Vitals last 24 hrs: Vitals:   03/14/24 2034 03/15/24 0100 03/15/24 0500 03/15/24 0732  BP: (!) 146/71   (!) 145/65  Pulse: 75     Resp: 20 18 18 17   Temp: (!) 97 F (36.1 C) 97.7 F (36.5 C) 98.4 F (36.9 C) 98.4 F (36.9 C)  TempSrc: Oral Oral Oral Oral  SpO2: 100%     Weight:      Height:        Physical Examination: General exam: alert awake, oriented x3 HEENT:Oral mucosa moist, Ear/Nose WNL grossly Respiratory system: Bilaterally clear BS,no use of accessory muscle Cardiovascular system: S1 & S2 +, No JVD. Gastrointestinal system: Abdomen soft,NT,ND, BS+ Nervous System: Alert, awake, moving all extremities,and following commands. Extremities: extremities warm, leg edema neg, RLE DRESSING PRESENT C/D/I  Skin: Warm, no rashes MSK: Normal muscle bulk,tone, power.

## 2024-03-13 NOTE — Progress Notes (Signed)
 Patient not seen during rounds as in cath lab for angio with Dr. Pearline - will call patient  later today.  Appreciate vascular surgery, angio today RLE.   Agree with amputation of R great toe, plan will be for tomorrow AM - NPO p MN. Ok to continue any anticoag per vascular recs.         Marolyn JULIANNA Honour, DPM [facility] / [facility]                   03/13/2024

## 2024-03-14 ENCOUNTER — Encounter (HOSPITAL_COMMUNITY): Payer: Self-pay | Admitting: Hospitalist

## 2024-03-14 ENCOUNTER — Inpatient Hospital Stay (HOSPITAL_COMMUNITY): Admitting: Certified Registered Nurse Anesthetist

## 2024-03-14 ENCOUNTER — Inpatient Hospital Stay (HOSPITAL_COMMUNITY)

## 2024-03-14 ENCOUNTER — Encounter (HOSPITAL_COMMUNITY)
Admission: EM | Disposition: A | Discharge status: Home Health | Payer: Self-pay | Source: Home / Self Care | Attending: Internal Medicine

## 2024-03-14 DIAGNOSIS — L97519 Non-pressure chronic ulcer of other part of right foot with unspecified severity: Secondary | ICD-10-CM | POA: Diagnosis not present

## 2024-03-14 DIAGNOSIS — M869 Osteomyelitis, unspecified: Secondary | ICD-10-CM

## 2024-03-14 DIAGNOSIS — E1169 Type 2 diabetes mellitus with other specified complication: Secondary | ICD-10-CM

## 2024-03-14 DIAGNOSIS — Z87891 Personal history of nicotine dependence: Secondary | ICD-10-CM

## 2024-03-14 DIAGNOSIS — I10 Essential (primary) hypertension: Secondary | ICD-10-CM

## 2024-03-14 HISTORY — PX: AMPUTATION TOE: SHX6595

## 2024-03-14 LAB — BASIC METABOLIC PANEL WITH GFR
Anion gap: 11 (ref 5–15)
BUN: 51 mg/dL — ABNORMAL HIGH (ref 8–23)
CO2: 19 mmol/L — ABNORMAL LOW (ref 22–32)
Calcium: 8.5 mg/dL — ABNORMAL LOW (ref 8.9–10.3)
Chloride: 107 mmol/L (ref 98–111)
Creatinine, Ser: 3.57 mg/dL — ABNORMAL HIGH (ref 0.61–1.24)
GFR, Estimated: 17 mL/min — ABNORMAL LOW (ref >60)
Glucose, Bld: 224 mg/dL — ABNORMAL HIGH (ref 70–99)
Potassium: 5.3 mmol/L — ABNORMAL HIGH (ref 3.5–5.1)
Sodium: 137 mmol/L (ref 135–145)

## 2024-03-14 LAB — CBC
HCT: 28.3 % — ABNORMAL LOW (ref 39.0–52.0)
Hemoglobin: 8.8 g/dL — ABNORMAL LOW (ref 13.0–17.0)
MCH: 27.5 pg (ref 26.0–34.0)
MCHC: 31.1 g/dL (ref 30.0–36.0)
MCV: 88.4 fL (ref 80.0–100.0)
Platelets: 183 K/uL (ref 150–400)
RBC: 3.2 MIL/uL — ABNORMAL LOW (ref 4.22–5.81)
RDW: 13.9 % (ref 11.5–15.5)
WBC: 5.7 K/uL (ref 4.0–10.5)
nRBC: 0 % (ref 0.0–0.2)

## 2024-03-14 LAB — GLUCOSE, CAPILLARY
Glucose-Capillary: 189 mg/dL — ABNORMAL HIGH (ref 70–99)
Glucose-Capillary: 196 mg/dL — ABNORMAL HIGH (ref 70–99)
Glucose-Capillary: 243 mg/dL — ABNORMAL HIGH (ref 70–99)
Glucose-Capillary: 275 mg/dL — ABNORMAL HIGH (ref 70–99)

## 2024-03-14 LAB — LIPID PANEL
Cholesterol: 138 mg/dL (ref 0–200)
HDL: 44 mg/dL (ref >40)
LDL Cholesterol: 74 mg/dL (ref 0–99)
Total CHOL/HDL Ratio: 3.1 ratio
Triglycerides: 98 mg/dL (ref <150)
VLDL: 20 mg/dL (ref 0–40)

## 2024-03-14 LAB — SURGICAL PCR SCREEN
MRSA, PCR: NEGATIVE
Staphylococcus aureus: NEGATIVE

## 2024-03-14 SURGERY — AMPUTATION, TOE
Anesthesia: Monitor Anesthesia Care | Site: Toe | Laterality: Right

## 2024-03-14 MED ORDER — FENTANYL CITRATE (PF) 100 MCG/2ML IJ SOLN
INTRAMUSCULAR | Status: DC | PRN
  Administered 2024-03-14: 25 ug via INTRAVENOUS

## 2024-03-14 MED ORDER — MUPIROCIN 2 % EX OINT
TOPICAL_OINTMENT | Freq: Two times a day (BID) | CUTANEOUS | Status: DC
  Filled 2024-03-14: qty 22

## 2024-03-14 MED ORDER — SODIUM CHLORIDE 0.9 % IV SOLN: INTRAVENOUS | Status: DC

## 2024-03-14 MED ORDER — HEPARIN SODIUM (PORCINE) 5000 UNIT/ML IJ SOLN
5000.0000 [IU] | Freq: Three times a day (TID) | INTRAMUSCULAR | Status: DC
Start: 2024-03-14 — End: 2024-03-15
  Administered 2024-03-14 – 2024-03-15 (×2): 5000 [IU] via SUBCUTANEOUS
  Filled 2024-03-14 (×2): qty 1

## 2024-03-14 MED ORDER — OXYCODONE HCL 5 MG PO TABS: 5.0000 mg | ORAL_TABLET | Freq: Once | ORAL | Status: DC | PRN

## 2024-03-14 MED ORDER — ATORVASTATIN CALCIUM 80 MG PO TABS
80.0000 mg | ORAL_TABLET | Freq: Every day | ORAL | Status: DC
  Administered 2024-03-15: 80 mg via ORAL
  Filled 2024-03-14: qty 1

## 2024-03-14 MED ORDER — FENTANYL CITRATE (PF) 100 MCG/2ML IJ SOLN: 25.0000 ug | INTRAMUSCULAR | Status: DC | PRN

## 2024-03-14 MED ORDER — ACETAMINOPHEN 10 MG/ML IV SOLN: 1000.0000 mg | Freq: Once | INTRAVENOUS | Status: DC | PRN

## 2024-03-14 MED ORDER — LIDOCAINE HCL (PF) 1 % IJ SOLN
INTRAMUSCULAR | Status: DC | PRN
  Administered 2024-03-14: 5 mL

## 2024-03-14 MED ORDER — INSULIN GLARGINE-YFGN 100 UNIT/ML ~~LOC~~ SOLN
7.0000 [IU] | Freq: Every day | SUBCUTANEOUS | Status: DC
  Administered 2024-03-14: 7 [IU] via SUBCUTANEOUS
  Filled 2024-03-14 (×2): qty 0.07

## 2024-03-14 MED ORDER — BUPIVACAINE HCL (PF) 0.5 % IJ SOLN
INTRAMUSCULAR | Status: AC
  Filled 2024-03-14: qty 30

## 2024-03-14 MED ORDER — FENTANYL CITRATE (PF) 100 MCG/2ML IJ SOLN
INTRAMUSCULAR | Status: AC
  Filled 2024-03-14: qty 2

## 2024-03-14 MED ORDER — PROPOFOL 500 MG/50ML IV EMUL
INTRAVENOUS | Status: DC | PRN
Start: 2024-03-14 — End: 2024-03-14
  Administered 2024-03-14: 75 ug/kg/min via INTRAVENOUS

## 2024-03-14 MED ORDER — OXYCODONE HCL 5 MG/5ML PO SOLN: 5.0000 mg | Freq: Once | ORAL | Status: DC | PRN

## 2024-03-14 MED ORDER — LACTATED RINGERS IV SOLN: INTRAVENOUS | Status: DC

## 2024-03-14 MED ORDER — SODIUM ZIRCONIUM CYCLOSILICATE 10 G PO PACK
10.0000 g | PACK | Freq: Once | ORAL | Status: AC
  Administered 2024-03-14: 10 g via ORAL
  Filled 2024-03-14: qty 1

## 2024-03-14 MED ORDER — CHLORHEXIDINE GLUCONATE 0.12 % MT SOLN
OROMUCOSAL | Status: AC
  Administered 2024-03-14: 15 mL via OROMUCOSAL
  Filled 2024-03-14: qty 15

## 2024-03-14 MED ORDER — SODIUM CHLORIDE 0.9 % IV SOLN: INTRAVENOUS | Status: DC | PRN

## 2024-03-14 MED ORDER — CHLORHEXIDINE GLUCONATE 0.12 % MT SOLN: 15.0000 mL | OROMUCOSAL | Status: AC

## 2024-03-14 MED ORDER — ENOXAPARIN SODIUM 30 MG/0.3ML IJ SOSY: 30.0000 mg | PREFILLED_SYRINGE | INTRAMUSCULAR | Status: DC

## 2024-03-14 MED ORDER — BUPIVACAINE HCL (PF) 0.5 % IJ SOLN
INTRAMUSCULAR | Status: DC | PRN
  Administered 2024-03-14: 5 mL

## 2024-03-14 MED ORDER — SODIUM CHLORIDE 0.9 % IR SOLN
Status: DC | PRN
Start: 2024-03-14 — End: 2024-03-14
  Administered 2024-03-14: 1

## 2024-03-14 MED ORDER — PROPOFOL 10 MG/ML IV BOLUS
INTRAVENOUS | Status: DC | PRN
  Administered 2024-03-14: 20 mg via INTRAVENOUS
  Administered 2024-03-14: 40 mg via INTRAVENOUS

## 2024-03-14 SURGICAL SUPPLY — 39 items
BLADE AVERAGE 25X9 (BLADE) IMPLANT
BLADE SURG 10 STRL SS (BLADE) ×1 IMPLANT
BLADE SURG 15 STRL LF DISP TIS (BLADE) ×1 IMPLANT
BNDG COHESIVE 3X5 TAN ST LF (GAUZE/BANDAGES/DRESSINGS) ×1 IMPLANT
BNDG COMPR ESMARK 4X3 LF (GAUZE/BANDAGES/DRESSINGS) ×1 IMPLANT
BNDG ELASTIC 3INX 5YD STR LF (GAUZE/BANDAGES/DRESSINGS) ×1 IMPLANT
BNDG ELASTIC 4INX 5YD STR LF (GAUZE/BANDAGES/DRESSINGS) IMPLANT
BNDG ELASTIC 6X10 VLCR STRL LF (GAUZE/BANDAGES/DRESSINGS) IMPLANT
BNDG GAUZE DERMACEA FLUFF 4 (GAUZE/BANDAGES/DRESSINGS) IMPLANT
CHLORAPREP W/TINT 26 (MISCELLANEOUS) IMPLANT
DRSG ADAPTIC 3X8 NADH LF (GAUZE/BANDAGES/DRESSINGS) IMPLANT
DRSG XEROFORM 1X8 (GAUZE/BANDAGES/DRESSINGS) IMPLANT
ELECTRODE REM PT RTRN 9FT ADLT (ELECTROSURGICAL) ×1 IMPLANT
GAUZE PAD ABD 7.5X8 STRL (GAUZE/BANDAGES/DRESSINGS) IMPLANT
GAUZE PAD ABD 8X10 STRL (GAUZE/BANDAGES/DRESSINGS) IMPLANT
GAUZE SPONGE 2X2 STRL 8-PLY (GAUZE/BANDAGES/DRESSINGS) IMPLANT
GAUZE SPONGE 4X4 12PLY STRL (GAUZE/BANDAGES/DRESSINGS) ×1 IMPLANT
GAUZE SPONGE 4X4 12PLY STRL LF (GAUZE/BANDAGES/DRESSINGS) IMPLANT
GAUZE STRETCH 2X75IN STRL (MISCELLANEOUS) ×1 IMPLANT
GAUZE XEROFORM 1X8 LF (GAUZE/BANDAGES/DRESSINGS) ×1 IMPLANT
GLOVE BIO SURGEON STRL SZ7.5 (GLOVE) ×1 IMPLANT
GLOVE BIOGEL PI IND STRL 7.5 (GLOVE) ×1 IMPLANT
GOWN STRL REUS W/ TWL LRG LVL3 (GOWN DISPOSABLE) ×2 IMPLANT
KIT BASIN OR (CUSTOM PROCEDURE TRAY) ×1 IMPLANT
NDL HYPO 25X1 1.5 SAFETY (NEEDLE) ×1 IMPLANT
NEEDLE HYPO 25X1 1.5 SAFETY (NEEDLE) ×1 IMPLANT
PACK ORTHO EXTREMITY (CUSTOM PROCEDURE TRAY) ×1 IMPLANT
PADDING CAST ABS COTTON 4X4 ST (CAST SUPPLIES) ×2 IMPLANT
SET HNDPC FAN SPRY TIP SCT (DISPOSABLE) IMPLANT
SOLN STERILE WATER BTL 1000 ML (IV SOLUTION) ×1 IMPLANT
SPIKE FLUID TRANSFER (MISCELLANEOUS) IMPLANT
STOCKINETTE 4X48 STRL (DRAPES) IMPLANT
SUT ETHILON 3 0 FSLX (SUTURE) IMPLANT
SUT PROLENE 3 0 PS 2 (SUTURE) IMPLANT
SUT PROLENE 4 0 PS 2 18 (SUTURE) IMPLANT
SYR CONTROL 10ML LL (SYRINGE) ×1 IMPLANT
TUBE CONNECTING 12X1/4 (SUCTIONS) IMPLANT
UNDERPAD 30X36 HEAVY ABSORB (UNDERPADS AND DIAPERS) ×1 IMPLANT
YANKAUER SUCT BULB TIP NO VENT (SUCTIONS) IMPLANT

## 2024-03-14 NOTE — Op Note (Signed)
 Full Operative Report  Date of Operation: 8:03 AM, 03/14/2024   Patient: [name] [name] - 73 y.o. male  Surgeon: Malvin Marsa FALCON, DPM   Assistant: None  Diagnosis: OSTEOMYELITIS RIGHT great toe   Procedure:  1. Amputation right great toe proximal phalanx head    Anesthesia: Monitor Anesthesia Care  Leopoldo Bruckner, MD  Anesthesiologist: Leopoldo Bruckner, MD CRNA: Zelphia Norleen HERO, CRNA   Estimated Blood Loss: Minimal   Hemostasis: 1) Anatomical dissection, mechanical compression, electrocautery 2) no tourniquet was used   Implants: * No implants in log *  Materials: prolene 3-0  Injectables: 1) Pre-operatively: 10 cc of 50:50 mixture 1%lidocaine  plain and 0.5% marcaine plain 2) Post-operatively: None   Specimens: - Pathology: right great toe  - Microbiology: Bone culture distal phalanx right great toe   Antibiotics: IV antibiotics given per schedule on the floor  Drains: None  Complications: Patient tolerated the procedure well without complication.   Operative findings: As below in detailed report  Indications for Procedure: [name] [name] presents to [place], Marsa FALCON, DPM with a chief complaint of chronic ulcer right great toe with underlying osteomyelitis of the distal phalanx. The patient has failed conservative treatments of various modalities. At this time the patient has elected to proceed with surgical correction. All alternatives, risks, and complications of the procedures were thoroughly explained to the patient. Patient exhibits appropriate understanding of all discussion points and informed consent was signed and obtained in the chart with no guarantees to surgical outcome given or implied.  Description of Procedure: Patient was brought to the operating room. Patient remained on their hospital bed in the supine position. A surgical timeout was performed and all members of the operating room, the procedure, and the surgical site were  identified. anesthesia occurred as per anesthesia record. Local anesthetic as previously described was then injected about the operative field in a local infiltrative block.  The operative lower extremity as noted above was then prepped and draped in the usual sterile manner. The following procedure then began.  Attention was directed to the FIRST digit on the RIGHT foot. A full-thickness incision encompassing the entire digit was made using a #15 blade. Dissection was carried down to bone. The toe was secured with a towel clamp, further dissected in its entirety, and disarticulated at the hallux IPJ and passed to the back table as a gross specimen. This was then labled and sent to pathology. The bone was noted to be soft and eroded, and consistent with osteomyelitis. A bone culture was harvested with rongeur from the distal phalanx. All remaining necrotic and devitalized soft tissue structures were visualized and dissected away using sharp and dull dissection. A sagittal saw was then used to make  transverse osteotomy through the proximal phalanx head and this bone was resected and sent for pathology. This allowed tension free closure and for margin purposes.  Care was taken to protect all neurovascular structures throughout the dissection. All bleeders were cauterized as necessary -   mild capillary bleeding in the soft tissues.  The area was then flushed with copious amounts of sterile saline. Then using the suture materials previously described, the site was closed in anatomic layers and the skin was well approximated under minimal tension.   The surgical site was then dressed with xeroform 4x4 kerlix and ace wrap. The patient tolerated both the procedure and anesthesia well with vital signs stable throughout. The patient was transferred in good condition and all vital signs stable  from  the OR to recovery under the discretion of anesthesia.  Condition: Vital signs stable, neurovascular status unchanged  from preoperative   Surgical plan:  Mild bleeding seen at amp site, hopeful this will be enough to heal. Clean margin expected. WBAT, recommend 5 days augmentin on discharge.   The patient will be WBAT in a post op shoe to the operative limb until further instructed. The dressing is to remain clean, dry, and intact. Will continue to follow unless noted elsewhere.   Marsa Honour, DPM [facility]

## 2024-03-14 NOTE — Anesthesia Preprocedure Evaluation (Signed)
 Anesthesia Evaluation  Patient identified by MRN, date of birth, ID band Patient awake    Reviewed: Allergy & Precautions, NPO status , Patient's Chart, lab work & pertinent test results  History of Anesthesia Complications Negative for: history of anesthetic complications  Airway Mallampati: II  TM Distance: >3 FB Neck ROM: Full    Dental  (+) Dental Advisory Given, Missing,    Pulmonary shortness of breath, neg COPD, former smoker   breath sounds clear to auscultation       Cardiovascular hypertension, Pt. on medications (-) angina + Peripheral Vascular Disease  (-) Past MI  Rhythm:Regular  1. Inferior basal hypokinesis . Left ventricular ejection fraction, by  estimation, is 50 to 55%. The left ventricle has low normal function. The  left ventricle demonstrates regional wall motion abnormalities (see  scoring diagram/findings for  description). There is moderate left ventricular hypertrophy. Left  ventricular diastolic parameters were normal.   2. Right ventricular systolic function is normal. The right ventricular  size is normal.   3. The mitral valve is abnormal. Trivial mitral valve regurgitation. No  evidence of mitral stenosis. Moderate mitral annular calcification.   4. The aortic valve is tricuspid. There is moderate calcification of the  aortic valve. There is moderate thickening of the aortic valve. Aortic  valve regurgitation is not visualized. Aortic valve  sclerosis/calcification is present, without any evidence  of aortic stenosis.   5. The inferior vena cava is normal in size with greater than 50%  respiratory variability, suggesting right atrial pressure of 3 mmHg.     Neuro/Psych  Headaches  Neuromuscular disease CVA, No Residual Symptoms  negative psych ROS   GI/Hepatic Neg liver ROS,GERD  Medicated and Controlled,,  Endo/Other  diabetes, Type 2, Insulin  Dependent  Lab Results      Component                 Value               Date                      HGBA1C                   8.3 (H)             01/18/2024             Renal/GU Renal InsufficiencyRenal diseaseLab Results      Component                Value               Date                      NA                       139                 03/11/2024                K                        4.8                 03/11/2024                CO2  20 (L)              03/11/2024                GLUCOSE                  84                  03/11/2024                BUN                      45 (H)              03/11/2024                CREATININE               3.72 (H)            03/12/2024                CALCIUM                   9.3                 03/11/2024                GFR                      22.66 (L)           08/05/2022                GFRNONAA                 16 (L)              03/12/2024            S/p kidney transplant     Musculoskeletal  (+) Arthritis ,    Abdominal   Peds  Hematology  (+) Blood dyscrasia, anemia Lab Results      Component                Value               Date                      WBC                      5.7                 03/14/2024                HGB                      8.8 (L)             03/14/2024                HCT                      28.3 (L)            03/14/2024                MCV                      88.4  03/14/2024                PLT                      183                 03/14/2024              Anesthesia Other Findings   Reproductive/Obstetrics                              Anesthesia Physical Anesthesia Plan  ASA: 3  Anesthesia Plan: MAC   Post-op Pain Management:    Induction: Intravenous  PONV Risk Score and Plan: 2 and Ondansetron   Airway Management Planned: Nasal Cannula, Natural Airway and Simple Face Mask  Additional Equipment: None  Intra-op Plan:   Post-operative Plan:   Informed Consent: I have  reviewed the patients History and Physical, chart, labs and discussed the procedure including the risks, benefits and alternatives for the proposed anesthesia with the patient or authorized representative who has indicated his/her understanding and acceptance.     Dental advisory given  Plan Discussed with: CRNA  Anesthesia Plan Comments:          Anesthesia Quick Evaluation

## 2024-03-14 NOTE — Progress Notes (Signed)
 PHARMACIST LIPID MONITORING   [name] [name] is a 73 y.o. male admitted on 03/11/2024 with R toe daibetic foot infection.  Pharmacy has been consulted to optimize lipid-lowering therapy with the indication of secondary prevention for clinical ASCVD.  Recent Labs:  Lipid Panel (last 6 months):   Lab Results  Component Value Date   CHOL 138 03/14/2024   TRIG 98 03/14/2024   HDL 44 03/14/2024   CHOLHDL 3.1 03/14/2024   VLDL 20 03/14/2024   LDLCALC 74 03/14/2024    Hepatic function panel (last 6 months):   Lab Results  Component Value Date   AST 13 (L) 03/11/2024   ALT 8 03/11/2024   ALKPHOS 31 (L) 03/11/2024   BILITOT 0.9 03/11/2024    SCr (since admission):   Serum creatinine: 3.72 mg/dL (H) 89/73/74 8773 Estimated creatinine clearance: 14.7 mL/min (A)  Current therapy and lipid therapy tolerance Current lipid-lowering therapy: atorvastatin  40 mg daily  Previous lipid-lowering therapies (if applicable): atorvastatin  40-80 mg, crestor  20 mg, pravastatin  10 mg  Documented or reported allergies or intolerances to lipid-lowering therapies (if applicable): None  Assessment:   Patient agrees with changes to lipid-lowering therapy  Plan:    1.Statin intensity (high intensity recommended for all patients regardless of the LDL):  Add or increase statin to high intensity.  2.Add ezetimibe (if any one of the following):   Not indicated at this time.  3.Refer to lipid clinic:   No  4.Follow-up with:  Primary care provider - Theophilus Andrews, Tully GRADE, MD  5.Follow-up labs after discharge:  Changes in lipid therapy were made. Check a lipid panel in 8-12 weeks then annually.      Thank you for allowing pharmacy to participate in this patient's care,  Suzen Sour, PharmD, BCCCP Clinical Pharmacist  Phone: [phone] 03/14/2024 10:19 AM  Please check AMION for all [facility] Pharmacy phone numbers After 10:00 PM, call Main Pharmacy [phone]

## 2024-03-14 NOTE — TOC Initial Note (Signed)
 Transition of Care [facility]) - Initial/Assessment Note    Patient Details  Name: [name] [name] MRN: [id number] Date of Birth: 1951/05/03  Transition of Care [facility]) CM/SW Contact:    Sudie Erminio Deems, RN Phone Number: 03/14/2024, 11:38 AM  Clinical Narrative: Risk for readmission assessment completed. Patient presented for wound right great toe. POD-1  amputation right  great toe. PTA patient was from home with spouse. Spouse was at the bedside during the visit. Patient is active with [facility]- PT/OT/RN- will need orders and F2F. Patient has DME: cane, rollator, rolling walker, and shower seat. Patient has Insurance and PCP; spouse drives patient to all appointments. Inpatient Case Manager will continue to follow for additional disposition needs as the patient progresses.   Expected Discharge Plan: Home w Home Health Services Barriers to Discharge: Continued Medical Work up   Patient Goals and CMS Choice Patient states their goals for this hospitalization and ongoing recovery are:: Plan for home with home health.          Expected Discharge Plan and Services In-house Referral: NA Discharge Planning Services: CM Consult Post Acute Care Choice: Home Health, Resumption of Svcs/PTA Provider Living arrangements for the past 2 months: Single Family Home                           HH Arranged: PT,OT, Adding RN  HH Agency: [facility] Date HH Agency Contacted: 03/14/24 Time HH Agency Contacted: 1138 Representative spoke with at [facility] Agency: Arna  Prior Living Arrangements/Services Living arrangements for the past 2 months: Single Family Home Lives with:: Spouse Patient language and need for interpreter reviewed:: Yes Do you feel safe going back to the place where you live?: Yes      Need for Family Participation in Patient Care: Yes (Comment) Care giver support system in place?: Yes (comment) Current home services: DME (Cane, rollator, rolling walker  and shower seat.) Criminal Activity/Legal Involvement Pertinent to Current Situation/Hospitalization: No - Comment as needed  Activities of Daily Living   ADL Screening (condition at time of admission) Independently performs ADLs?: Yes (appropriate for developmental age) Is the patient deaf or have difficulty hearing?: No Does the patient have difficulty seeing, even when wearing glasses/contacts?: No Does the patient have difficulty concentrating, remembering, or making decisions?: No  Permission Sought/Granted Permission sought to share information with : Case Manager, Magazine Features Editor, Family Supports Permission granted to share information with : Yes, Verbal Permission Granted     Permission granted to share info w AGENCY: [facility]        Emotional Assessment Appearance:: Appears stated age Attitude/Demeanor/Rapport: Engaged Affect (typically observed): Appropriate Orientation: : Oriented to Self, Oriented to Place, Oriented to  Time, Oriented to Situation Alcohol / Substance Use: Not Applicable Psych Involvement: No (comment)  Admission diagnosis:  Diabetic foot infection ([name]) [Z88.371, L08.9] Skin ulcer of right great toe, unspecified ulcer stage ([name]) [L97.519] Patient Active Problem List   Diagnosis Date Noted   Skin ulcer of right great toe, unspecified ulcer stage ([name]) 03/12/2024   Osteomyelitis of great toe of right foot ([name]) 03/12/2024   Vitamin D  deficiency 08/06/2022   Carotid artery stenosis without cerebral infarction, bilateral 02/17/2022   Wall motion abnormality of inferior wall of left ventricle 02/17/2022   Ulcer of heel and midfoot ([name]) 01/03/2022   Cellulitis 09/09/2021   Plantar fasciitis of left foot 03/05/2021   AKI (acute kidney injury) 10/25/2020  History of ischemic stroke 10/24/2020   Hyperglycemia 10/24/2020   Cerebral thrombosis with cerebral infarction 09/27/2020   Pulmonary embolism ([name]) 09/25/2020   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   Oropharyngeal dysphagia 07/11/2020   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   Headache 07/10/2020   Perianal lesion 07/10/2020   Physical deconditioning 07/10/2020   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   Weight loss 07/10/2020   Generalized weakness 07/08/2020   AMS (altered mental status) 07/06/2020   Overactive bladder 03/08/2020   Fever 01/17/2020   Erectile dysfunction after radical prostatectomy 11/23/2019   Anaphylactic shock, unspecified, initial encounter 10/17/2019   Peripheral vascular disease 10/17/2019   Pain, unspecified 10/14/2019   Pruritus, unspecified 10/14/2019   Shortness of breath 10/14/2019   Type 2 diabetes mellitus with diabetic neuropathy, unspecified ([name]) 10/14/2019   Acute UTI 10/13/2019   Delayed graft function of kidney 10/12/2019   Postoperative fever 10/11/2019   Immunosuppressed status 10/09/2019   Kidney transplanted 10/09/2019   Prophylactic antibiotic 10/09/2019   Status post placement of ureteral stent 10/09/2019   Steroid-induced hyperglycemia 10/09/2019   Allergy, unspecified, initial encounter 07/25/2019   Hematemesis 06/14/2019   COVID-19 virus infection 06/10/2019   Fall at home, initial encounter 06/10/2019   Syncope 06/10/2019   Elevated troponin 06/10/2019   Dependence on renal dialysis 05/26/2018   Cyst of kidney, acquired 11/29/2017   Other long term (current) drug therapy 11/24/2017   Unspecified jaundice 11/24/2017   Elevated prostate specific antigen (PSA) 02/12/2017   ESRD on peritoneal dialysis ([name]) 02/12/2017   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) 02/12/2017   Secondary hyperparathyroidism 02/12/2017   Type 2 diabetes mellitus ([name]) 02/12/2017   Malignant neoplasm of prostate ([name]) 02/08/2017   Moderate protein-calorie malnutrition 10/16/2016   Iron deficiency anemia, unspecified 10/05/2016   Hypokalemia 09/29/2016   Anemia in  chronic kidney disease 09/29/2016   Coagulation defect, unspecified 09/29/2016   End stage renal disease ([name]) 09/29/2016   Localized edema 09/29/2016   Systemic inflammatory response syndrome (sirs) of non-infectious origin without acute organ dysfunction ([name]) 09/29/2016   Pure hypercholesterolemia, unspecified 09/29/2016   Preop cardiovascular exam 04/27/2016   Diabetes mellitus type 2, controlled ([name]) 06/21/2015   Other hyperlipidemia 12/11/2014   Essential hypertension 09/10/2014   SIRS (systemic inflammatory response syndrome) ([name]) 08/01/2014   Bronchitis    Sepsis ([name]) 07/31/2014   Syncope due to orthostatic hypotension 07/28/2012   CKD (chronic kidney disease), stage IV ([name]) 07/28/2012   Acute kidney failure 07/28/2012   Benign prostatic hyperplasia without lower urinary tract symptoms 06/25/2011   Hereditary and idiopathic peripheral neuropathy 07/20/2007   PCP:  Theophilus Andrews, Tully GRADE, MD Pharmacy:   [facility] [phone] - [place],  - [address] AT [facility] [address] [place] [address] Phone: [phone] Fax: [phone]  [facility] [address] [place] [address] Phone: [phone] Fax: [phone]     Social Drivers of Health (SDOH) Social History: SDOH Screenings   Food Insecurity: No Food Insecurity (03/12/2024)  Housing: Low Risk  (03/12/2024)  Transportation Needs: No Transportation Needs (03/12/2024)  Utilities: Not At Risk (03/12/2024)  Alcohol Screen: Low Risk  (08/05/2022)  Depression (PHQ2-9): Low Risk  (01/25/2024)  Financial Resource Strain: Low Risk  (11/23/2023)   Received from [facility]  Physical Activity: Sufficiently Active (08/05/2022)  Social Connections: Socially Integrated (03/12/2024)  Stress: No Stress Concern Present (08/05/2022)  Tobacco  Use: Medium Risk (03/14/2024)   SDOH Interventions:     Readmission Risk  Interventions    03/14/2024   11:35 AM 01/21/2024   10:12 AM  Readmission Risk Prevention Plan  Transportation Screening Complete Complete  PCP or Specialist Appt within 3-5 Days  Complete  HRI or Home Care Consult  Complete  Social Work Consult for Recovery Care Planning/Counseling  Complete  Palliative Care Screening  Complete  Medication Review Oceanographer) Referral to Pharmacy Referral to Pharmacy  PCP or Specialist appointment within 3-5 days of discharge Complete   HRI or Home Care Consult Complete   SW Recovery Care/Counseling Consult Complete   Palliative Care Screening Not Applicable   Skilled Nursing Facility Not Applicable

## 2024-03-14 NOTE — Progress Notes (Addendum)
 PROGRESS NOTE [name] [name]  FMW:[id number] DOB: [birth year] DOA: 03/11/2024 PCP: Theophilus Andrews, Tully GRADE, MD  Brief Narrative/Hospital Course: [name] [name] is a 73 y.o. male with PMH of  renal transplant on immunosuppression therapy, PE on Eliquis , HTN, HLD, DM2, and recent admission in 01/2024 for Proteus/Klebsiella UTI treated with PO cefadroxil  for 5 days who p/w R great toe diabetic foot infection c/b open ulceration and XR c/f osteomyelitis.     The patient presented with a problem involving the right big toe. The issue began on Wednesday with pain in the area, but there was no redness noted in the entire foot. The situation progressed to where a spot on the toe opened up and began bleeding. There was no drainage or pus reported. The patient reported severe pain, especially when standing, which led to an inability to walk. The patient did not attempt any treatment at home due to difficulty in accessing the area. The decision to seek hospital care was made when the patient could no longer bear weight on the foot.   In the ED, the pt AFVSS. Labs notable for Cr 3.41 (which is baseline), and lactic acid 0.7. XR R foot showed erosion of the first distal phalanx tuft consistent with osteomyelitis. EDP started IV Zosyn , consulted Podiatry/Ortho who agreed to eval pt later today and requested medicine admission.  Subjective: Seen and examined today Overnight on RA, afebrile, VSS, Labs blood sugar 130s, last BUN 45 creatinine 3.7 hemoglobin 9.5 This morning he feels fine pain is controlled underwent right great toe amputation this am Sensation intact but diminished on the right toes  Assessment and plan:  Right first toe ulceration with associated osteomyelitis in the setting of diabetes mellitus/PVD: On admission had x-ray, and is started on vancomycin  and cefepime , blood culture sent s/p angiography 1037-maximally revascularized on the right with good flow to the foot via the AT with  lithotripsy, balloon angioplasty- underwent right great toe amputation 10/28 am Continue multimodal pain management, dressing per podiatry Plan to change to Augmentin upon discharge to complete 5 days course  CKD stage IV/V , s/p renal transplant: Renal function remained stable at baseline, continue his prednisone , Prograf  and monitor Recent Labs    01/18/24 0542 01/19/24 0231 01/20/24 0215 01/21/24 0817 01/22/24 0204 01/23/24 0147 03/11/24 2114 03/12/24 1226  BUN 69* 65* 57* 53* 50* 47* 45*  --   CREATININE 4.39* 4.27* 4.01* 3.79* 3.79* 3.68* 3.41* 3.72*  CO2 18* 19* 20* 20* 20* 20* 20*  --   K 4.2 3.9 4.4 4.1 4.7 4.6 4.8  --     PE hxL Previously on Eliquis .Patient was seen by his [facility] hematology on January 25 2024 and advised to stop Eliquis  and changed to aspirin .  Patient was not taking Eliquis  PTA. Transition to aspirin     HTN Stable cont Coreg  and lasix    HLD Cont atorvastatin     DM2 A1c 8.3 in 01/2024. Cont Semglee  5U nightly + SSI TID AC prn Recent Labs  Lab 03/13/24 0804 03/13/24 1140 03/13/24 1530 03/13/24 2047 03/14/24 0801  GLUCAP 139* 111* 171* 188* 189*     Moderate malnutrition with/ Body mass index is 19.11 kg/m.: Will benefit dietary augmentation.    DVT prophylaxis: SCD's Start: 03/13/24 2113Heparin drip Code Status:   Code Status: Full Code Family Communication: plan of care discussed with patient at bedside. Patient status is: Remains hospitalized because of severity of illness Level of care: Progressive Cardiac   Dispo: The patient is from: home  Anticipated disposition: TBD Objective: Vitals last 24 hrs: Vitals:   03/14/24 0646 03/14/24 0800 03/14/24 0815 03/14/24 0835  BP: (!) 158/75 (!) 101/58 105/67 121/74  Pulse: 82 71 70 69  Resp: 18 16 15 16   Temp: 98.1 F (36.7 C) 97.8 F (36.6 C) 97.8 F (36.6 C) 98.3 F (36.8 C)  TempSrc: Oral   Oral  SpO2: 100% 100% 99% 100%  Weight: 60.4 kg     Height: 5' 10 (1.778 m)        Physical Examination: General exam: alert awake, oriented x3 pleasant HEENT:Oral mucosa moist, Ear/Nose WNL grossly Respiratory system: Bilaterally clear BS,no use of accessory muscle Cardiovascular system: S1 & S2 +, No JVD. Gastrointestinal system: Abdomen soft,NT,ND, BS+ Nervous System: Alert, awake, moving all extremities,and following commands. Extremities: extremities warm, leg edema neg, RT foot with dressing in place on right great toe rest of the toes visible sensation intact/diminished  Skin: Warm, no rashes MSK: Normal muscle bulk,tone, power.  Medications reviewed:  Scheduled Meds:  amLODipine   2.5 mg Oral Daily   aspirin  EC  81 mg Oral Daily   atorvastatin   40 mg Oral Daily   calcitRIOL   0.25 mcg Oral Q M,W,F   carvedilol   12.5 mg Oral BID WC   clopidogrel  75 mg Oral Q breakfast   famotidine   20 mg Oral Daily   folic acid   1 mg Oral Daily   furosemide   40 mg Oral Daily   insulin  aspart  0-15 Units Subcutaneous TID WC   insulin  glargine-yfgn  5 Units Subcutaneous QHS   melatonin  10 mg Oral QHS   mupirocin ointment   Nasal BID   predniSONE   5 mg Oral Q breakfast   sevelamer  carbonate  800 mg Oral TID WC   sodium chloride  flush  3 mL Intravenous Q12H   tacrolimus   3 mg Oral BID   Continuous Infusions:  sodium chloride      sodium chloride  10 mL/hr at 03/14/24 0656   ceFEPime  (MAXIPIME ) IV 2 g (03/13/24 1710)   vancomycin      Diet: Diet Order             Diet regular Room service appropriate? Yes; Fluid consistency: Thin  Diet effective now                    Data Reviewed: I have personally reviewed following labs and imaging studies ( see epic result tab) CBC: Recent Labs  Lab 03/11/24 2114 03/12/24 1226 03/13/24 0449 03/14/24 0421  WBC 9.7 6.6 6.7 5.7  NEUTROABS 8.1*  --   --   --   HGB 9.6* 9.9* 9.5* 8.8*  HCT 31.3* 32.1* 30.4* 28.3*  MCV 90.2 89.2 88.4 88.4  PLT 188 191 207 183   CMP: Recent Labs  Lab 03/11/24 2114 03/12/24 1226   NA 139  --   K 4.8  --   CL 104  --   CO2 20*  --   GLUCOSE 84  --   BUN 45*  --   CREATININE 3.41* 3.72*  CALCIUM  9.3  --    GFR: Estimated Creatinine Clearance: 14.7 mL/min (A) (by C-G formula based on SCr of 3.72 mg/dL (H)). Recent Labs  Lab 03/11/24 2114  AST 13*  ALT 8  ALKPHOS 31*  BILITOT 0.9  PROT 6.9  ALBUMIN 3.5   No results for input(s): LIPASE, AMYLASE in the last 168 hours. No results for input(s): AMMONIA in the last 168 hours. Coagulation Profile:  No results for input(s): INR, PROTIME in the last 168 hours. Unresulted Labs (From admission, onward)     Start     Ordered   03/19/24 0500  Creatinine, serum  (enoxaparin  (LOVENOX )    CrCl >/= 30 ml/min)  Weekly,   R     Comments: while on enoxaparin  therapy    03/12/24 0854   03/15/24 0500  Basic metabolic panel with GFR  Daily,   R      03/14/24 0948   03/14/24 0747  Aerobic/Anaerobic Culture w Gram Stain (surgical/deep wound)  RELEASE UPON ORDERING,   TIMED       Comments: Specimen A:  Phone # [phone]        Previous Biopsy:  no Is the patient on airborne/droplet precautions? No           Clinical History:  Osteomyelitis right hand Copy of Report to:  N/A Specimen Disposition: Delivered to Histology     03/14/24 0747   03/13/24 0500  CBC  Daily,   R      03/12/24 2137           Antimicrobials/Microbiology: Anti-infectives (From admission, onward)    Start     Dose/Rate Route Frequency Ordered Stop   03/14/24 1300  vancomycin  (VANCOREADY) IVPB 750 mg/150 mL        750 mg 150 mL/hr over 60 Minutes Intravenous Every 48 hours 03/12/24 1143     03/12/24 1700  ceFEPIme  (MAXIPIME ) 2 g in sodium chloride  0.9 % 100 mL IVPB        2 g 200 mL/hr over 30 Minutes Intravenous Every 24 hours 03/12/24 1109     03/12/24 1115  vancomycin  (VANCOREADY) IVPB 1250 mg/250 mL        1,250 mg 166.7 mL/hr over 90 Minutes Intravenous  Once 03/12/24 1109 03/12/24 1501   03/12/24 0800  piperacillin -tazobactam  (ZOSYN ) IVPB 3.375 g        3.375 g 100 mL/hr over 30 Minutes Intravenous  Once 03/12/24 0748 03/12/24 1029         Component Value Date/Time   SDES BLOOD LEFT WRIST 03/12/2024 0754   SPECREQUEST  03/12/2024 0754    BOTTLES DRAWN AEROBIC AND ANAEROBIC Blood Culture results may not be optimal due to an inadequate volume of blood received in culture bottles   CULT  03/12/2024 0754    NO GROWTH 2 DAYS Performed at [facility], [address] [place], [place] [address]    REPTSTATUS PENDING 03/12/2024 9245    Procedures: Procedure(s) (LRB): AMPUTATION, TOE (Right)   Mennie LAMY, MD [facility] 03/14/2024, 10:17 AM

## 2024-03-14 NOTE — Transfer of Care (Signed)
 Immediate Anesthesia Transfer of Care Note  Patient: ADE STMARIE  Procedure(s) Performed: AMPUTATION, TOE (Right: Toe)  Patient Location: PACU  Anesthesia Type:MAC combined with regional for post-op pain  Level of Consciousness: awake and alert   Airway & Oxygen Therapy: Patient Spontanous Breathing  Post-op Assessment: Report given to RN and Post -op Vital signs reviewed and stable  Post vital signs: Reviewed and stable  Last Vitals:  Vitals Value Taken Time  BP 120/80   Temp 98   Pulse 69 03/14/24 08:04  Resp 14 03/14/24 08:03  SpO2 99 % 03/14/24 08:04  Vitals shown include unfiled device data.  Last Pain:  Vitals:   03/14/24 0653  TempSrc:   PainSc: 0-No pain      Patients Stated Pain Goal: 0 (03/14/24 9346)  Complications: No notable events documented.

## 2024-03-14 NOTE — Progress Notes (Signed)
Sent to OR for surgery.

## 2024-03-14 NOTE — Anesthesia Postprocedure Evaluation (Signed)
 Anesthesia Post Note  Patient: [name] [name]  Procedure(s) Performed: AMPUTATION, TOE (Right: Toe)     Patient location during evaluation: PACU Anesthesia Type: MAC Level of consciousness: awake and alert Pain management: pain level controlled Vital Signs Assessment: post-procedure vital signs reviewed and stable Respiratory status: spontaneous breathing, nonlabored ventilation and respiratory function stable Cardiovascular status: stable and blood pressure returned to baseline Postop Assessment: no apparent nausea or vomiting Anesthetic complications: no   No notable events documented.  Last Vitals:  Vitals:   03/14/24 0815 03/14/24 0835  BP: 105/67 121/74  Pulse: 70 69  Resp: 15 16  Temp: 36.6 C 36.8 C  SpO2: 99% 100%    Last Pain:  Vitals:   03/14/24 0835  TempSrc: Oral  PainSc: 0-No pain                 Stafford Riviera

## 2024-03-14 NOTE — Progress Notes (Signed)
 History and Physical Interval Note:  03/14/2024 7:11 AM  [name] [name]  has presented today for surgery, with the diagnosis of osteomyelitis of distal phalanx right great toe.  The various methods of treatment have been discussed with the patient and family. After consideration of risks, benefits and other options for treatment, the patient has consented to   Procedure(s) with comments: AMPUTATION, TOE (Right) - RIGHT GREAT TOE as a surgical intervention.  The patient's history has been reviewed, patient examined, no change in status, stable for surgery.  I have reviewed the patient's chart and labs.  Questions were answered to the patient's satisfaction.     [name] [name]

## 2024-03-14 NOTE — Plan of Care (Signed)

## 2024-03-14 NOTE — Progress Notes (Signed)
 Orthopedic Tech Progress Note Patient Details:  [name] [name] 03/04/1951 [id number]  Ortho Devices Type of Ortho Device: Postop shoe/boot Ortho Device/Splint Interventions: Ordered   Post Interventions Patient Tolerated: Well  Harun Brumley A [name] 03/14/2024, 11:27 AM

## 2024-03-14 NOTE — Plan of Care (Signed)

## 2024-03-15 ENCOUNTER — Other Ambulatory Visit (HOSPITAL_COMMUNITY): Payer: Self-pay

## 2024-03-15 ENCOUNTER — Inpatient Hospital Stay (HOSPITAL_COMMUNITY)

## 2024-03-15 ENCOUNTER — Encounter (HOSPITAL_COMMUNITY): Payer: Self-pay | Admitting: Podiatry

## 2024-03-15 DIAGNOSIS — L97519 Non-pressure chronic ulcer of other part of right foot with unspecified severity: Secondary | ICD-10-CM | POA: Diagnosis not present

## 2024-03-15 LAB — BASIC METABOLIC PANEL WITH GFR
Anion gap: 9 (ref 5–15)
BUN: 52 mg/dL — ABNORMAL HIGH (ref 8–23)
CO2: 19 mmol/L — ABNORMAL LOW (ref 22–32)
Calcium: 9.1 mg/dL (ref 8.9–10.3)
Chloride: 107 mmol/L (ref 98–111)
Creatinine, Ser: 3.63 mg/dL — ABNORMAL HIGH (ref 0.61–1.24)
GFR, Estimated: 17 mL/min — ABNORMAL LOW (ref >60)
Glucose, Bld: 201 mg/dL — ABNORMAL HIGH (ref 70–99)
Potassium: 4.9 mmol/L (ref 3.5–5.1)
Sodium: 135 mmol/L (ref 135–145)

## 2024-03-15 LAB — CBC
HCT: 26.7 % — ABNORMAL LOW (ref 39.0–52.0)
Hemoglobin: 8.3 g/dL — ABNORMAL LOW (ref 13.0–17.0)
MCH: 27 pg (ref 26.0–34.0)
MCHC: 31.1 g/dL (ref 30.0–36.0)
MCV: 87 fL (ref 80.0–100.0)
Platelets: 199 K/uL (ref 150–400)
RBC: 3.07 MIL/uL — ABNORMAL LOW (ref 4.22–5.81)
RDW: 13.8 % (ref 11.5–15.5)
WBC: 6.3 K/uL (ref 4.0–10.5)
nRBC: 0 % (ref 0.0–0.2)

## 2024-03-15 LAB — GLUCOSE, CAPILLARY
Glucose-Capillary: 143 mg/dL — ABNORMAL HIGH (ref 70–99)
Glucose-Capillary: 100 mg/dL — ABNORMAL HIGH (ref 70–99)

## 2024-03-15 MED ORDER — AMOXICILLIN-POT CLAVULANATE 500-125 MG PO TABS
1.0000 | ORAL_TABLET | Freq: Two times a day (BID) | ORAL | 0 refills | Status: DC
  Filled 2024-03-15: qty 10, 5d supply, fill #0

## 2024-03-15 MED ORDER — AMOXICILLIN-POT CLAVULANATE 500-125 MG PO TABS
1.0000 | ORAL_TABLET | Freq: Two times a day (BID) | ORAL | Status: DC
  Administered 2024-03-15: 1 via ORAL
  Filled 2024-03-15 (×2): qty 1

## 2024-03-15 MED ORDER — CLOPIDOGREL BISULFATE 75 MG PO TABS
75.0000 mg | ORAL_TABLET | Freq: Every day | ORAL | 0 refills | Status: AC
  Filled 2024-03-15: qty 30, 30d supply, fill #0

## 2024-03-15 NOTE — Progress Notes (Signed)
 Discharge Nurse Summary: DC order noted per MD. DC RN at bedside with patient. Patient agreeable with discharge plan, states wife is at an appointment and will arrive soon for pickup. Has no clothes at the bedside, patient instructed to inform wife he needs clothes prior to arrival. Refused paper scrubs.   AVS printed/reviewed. PIVs removed, skin intact. No DME needs. No home meds. TOC meds pending pickup. CP/Edu resolved. Telemonitor returned to charging station. All belongings accounted for. Dressing to wound, CDI w/o bleeding or drainage. See LDAs.   Rosario EMERSON Lund, RN

## 2024-03-15 NOTE — Evaluation (Signed)
 Physical Therapy Evaluation Patient Details Name: [name] [name] MRN: [id number] DOB: 02/13/1951 Today's Date: 03/15/2024  History of Present Illness  73 y/o M presenting to ED on 10/25 with R big toe infection/pain, s/p BLE angiogram with ballon angioplasty of R AT10/27. S/p Amputation of R great toe on 10/28.    PMH includes renal transplant on immunosuppressive therapy, PE on Eliquis , HTN, HLD, DM2, recent admit 01/2024 for Proteus/Klebsiella UTI   Clinical Impression  Pt presents with condition above and deficits mentioned below, see PT Problem List. PTA, he was independent without DME, except he would intermittently use a RW or cane when in the community. He lives with his daughter and grandchildren in a 1-level house with 2 STE. Currently, the pt is benefiting and preferring to a use a RW for pain management and stability when ambulating, but is capable of ambulating without UE support without LOB. He does report some decreased sensation in his R toes and medial foot. He demonstrates deficits in balance and endurance, but will likely progress well with frequent mobility. Thus, do not anticipate a need for post acute PT at d/c. Will continue to follow acutely to maximize his return to baseline prior to d/c home        If plan is discharge home, recommend the following: Assistance with cooking/housework   Can travel by private vehicle        Equipment Recommendations None recommended by PT  Recommendations for Other Services       Functional Status Assessment Patient has had a recent decline in their functional status and demonstrates the ability to make significant improvements in function in a reasonable and predictable amount of time.     Precautions / Restrictions Precautions Precautions: Fall Precaution/Restrictions Comments: RLE postop shoe Restrictions Weight Bearing Restrictions Per Provider Order: Yes RLE Weight Bearing Per Provider Order: Weight bearing as  tolerated Other Position/Activity Restrictions: in postop shoe      Mobility  Bed Mobility               General bed mobility comments: Pt in recliner upon arrival and at end of session    Transfers                   General transfer comment: Supervision for safety standing from recliner, no LOB    Ambulation/Gait Ambulation/Gait assistance: Supervision Gait Distance (Feet): 210 Feet Assistive device: Rolling walker (2 wheels), None Gait Pattern/deviations: Step-to pattern, Step-through pattern, Decreased step length - left, Decreased stance time - right, Decreased stride length, Trunk flexed, Antalgic, Narrow base of support Gait velocity: reduced Gait velocity interpretation: 1.31 - 2.62 ft/sec, indicative of limited community ambulator   General Gait Details: Pt ambulated initially with the RW and with a step-to pattern. Cues provided to increase L step length to progress to a step-through gait pattern and to increase distance from RW slightly to allow him more room to step through. Narrow BOS when using RW. No LOB. When not using RW, pt widens his BOS some, again no LOB though. Supervision for safety  Stairs Stairs: Yes Stairs assistance: Contact guard assist Stair Management: Two rails, Step to pattern, Forwards Number of Stairs: 2 General stair comments: Educated pt to lead up with his L foot and down with his R, needs cues for compliance with good carryover noted. CGA for safety  Wheelchair Mobility     Tilt Bed    Modified Rankin (Stroke Patients Only)       Balance  Pertinent Vitals/Pain Pain Assessment Pain Assessment: 0-10 Pain Score: 1  Pain Location: R great toe incision site Pain Descriptors / Indicators: Discomfort, Operative site guarding Pain Intervention(s): Limited activity within patient's tolerance, Monitored during session, Repositioned    Home Living  Family/patient expects to be discharged to:: Private residence Living Arrangements: Spouse/significant other;Children (daughter & grandchildren) Available Help at Discharge: Family;Available 24 hours/day Type of Home: House Home Access: Stairs to enter Entrance Stairs-Rails: Right;Left;Can reach both Entrance Stairs-Number of Steps: 2   Home Layout: One level Home Equipment: Agricultural Consultant (2 wheels);Rollator (4 wheels);Cane - single point;Tub bench      Prior Function Prior Level of Function : Independent/Modified Independent             Mobility Comments: no AD typically, but occasionally using cane/RW if in community ADLs Comments: ind     Extremity/Trunk Assessment   Upper Extremity Assessment Upper Extremity Assessment: Defer to OT evaluation    Lower Extremity Assessment Lower Extremity Assessment: RLE deficits/detail RLE Deficits / Details: reports diminished sensation in medial foot and toes, but intact elsewhere; s/p great toe amputation    Cervical / Trunk Assessment Cervical / Trunk Assessment: Normal  Communication   Communication Communication: No apparent difficulties    Cognition Arousal: Alert Behavior During Therapy: WFL for tasks assessed/performed   PT - Cognitive impairments: No apparent impairments                         Following commands: Intact       Cueing Cueing Techniques: Verbal cues     General Comments General comments (skin integrity, edema, etc.): VSS on RA (Simultaneous filing. User may not have seen previous data.)    Exercises     Assessment/Plan    PT Assessment Patient needs continued PT services  PT Problem List Decreased activity tolerance;Decreased balance;Decreased mobility;Decreased knowledge of use of DME;Impaired sensation;Pain;Decreased skin integrity       PT Treatment Interventions DME instruction;Gait training;Stair training;Functional mobility training;Therapeutic activities;Therapeutic  exercise;Balance training;Neuromuscular re-education;Patient/family education    PT Goals (Current goals can be found in the Care Plan section)  Acute Rehab PT Goals Patient Stated Goal: to improve PT Goal Formulation: With patient Time For Goal Achievement: 03/29/24 Potential to Achieve Goals: Good    Frequency Min 1X/week     Co-evaluation               AM-PAC PT 6 Clicks Mobility  Outcome Measure Help needed turning from your back to your side while in a flat bed without using bedrails?: A Little Help needed moving from lying on your back to sitting on the side of a flat bed without using bedrails?: A Little Help needed moving to and from a bed to a chair (including a wheelchair)?: A Little Help needed standing up from a chair using your arms (e.g., wheelchair or bedside chair)?: A Little Help needed to walk in hospital room?: A Little Help needed climbing 3-5 steps with a railing? : A Little 6 Click Score: 18    End of Session   Activity Tolerance: Patient tolerated treatment well Patient left: in chair;with call bell/phone within reach   PT Visit Diagnosis: Unsteadiness on feet (R26.81);Other abnormalities of gait and mobility (R26.89);Difficulty in walking, not elsewhere classified (R26.2);Pain Pain - Right/Left: Right Pain - part of body: Ankle and joints of foot    Time: 9090-9075 PT Time Calculation (min) (ACUTE ONLY): 15 min   Charges:   PT  Evaluation $PT Eval Low Complexity: 1 Low   PT General Charges $$ ACUTE PT VISIT: 1 Visit         Theo Ferretti, PT, DPT Acute Rehabilitation Services  Office: [phone]   Theo CHRISTELLA Ferretti 03/15/2024, 9:59 AM

## 2024-03-15 NOTE — Plan of Care (Signed)
  Problem: Skin Integrity: Goal: Risk for impaired skin integrity will decrease Outcome: Progressing   Problem: Activity: Goal: Risk for activity intolerance will decrease Outcome: Progressing   Problem: Safety: Goal: Ability to remain free from injury will improve Outcome: Progressing   Problem: Skin Integrity: Goal: Risk for impaired skin integrity will decrease Outcome: Progressing   

## 2024-03-15 NOTE — Discharge Summary (Signed)
 Physician Discharge Summary  [name] FMW:[id number] DOB: 1951-04-21 DOA: 03/11/2024  PCP: Theophilus Andrews, Tully GRADE, MD  Admit date: 03/11/2024 Discharge date: 03/15/2024 Recommendations for Outpatient Follow-up:  Follow up with PCP in 1 weeks-call for appointment Please obtain BMP/CBC in one week Dr Malvin in 1 wk  Discharge Dispo: home w/ [facility] Discharge Condition: Stable Code Status:   Code Status: Full Code Diet recommendation:  Diet Order             Diet Carb Modified           Diet regular Room service appropriate? Yes; Fluid consistency: Thin  Diet effective now                    Brief/Interim Summary: [name] [name] is a 73 y.o. male with PMH of  renal transplant on immunosuppression therapy, PE on Eliquis , HTN, HLD, DM2, and recent admission in 01/2024 for Proteus/Klebsiella UTI treated with PO cefadroxil  for 5 days who p/w R great toe diabetic foot infection c/b open ulceration and XR c/f osteomyelitis.     The patient presented with a problem involving the right big toe. The issue began on Wednesday with pain in the area, but there was no redness noted in the entire foot. The situation progressed to where a spot on the toe opened up and began bleeding. There was no drainage or pus reported. The patient reported severe pain, especially when standing, which led to an inability to walk. The patient did not attempt any treatment at home due to difficulty in accessing the area. The decision to seek hospital care was made when the patient could no longer bear weight on the foot. In the ED, the pt AFVSS. Labs notable for Cr 3.41 (which is baseline), and lactic acid 0.7. XR R foot showed erosion of the first distal phalanx tuft consistent with osteomyelitis. EDP started IV Zosyn , consulted Podiatry/Ortho who agreed to eval pt later today and requested medicine admission. s/p angiography 10/27-maximally revascularized on the right with good flow to the foot via  the AT with lithotripsy, balloon angioplasty- s/p  right great toe amputation 10/28 am Now on aspirin  Plavix per vascular-he was no longer on Eliquis  PTA and was only on aspirin  81 Discussed with podiatry For discharge home keep the dressing on until seen in the clinic follow-up in next week Seen by PT OT, patient is eager to go home today  Subjective: Seen and examined Overnight afebrile on room air BP fairly stable Labs reviewed creatinine about the same 3.6, hemoglobin drifting 9.5> 8.8> 8.3  Discharge Diagnoses:   Right first toe ulceration with associated osteomyelitis in the setting of diabetes mellitus/PVD: On admission had x-ray, and is started on vancomycin  and cefepime , blood culture sent- NGTD. s/p angiography 10/27-maximally revascularized on the right with good flow to the foot via the AT with lithotripsy, balloon angioplasty- s/p  right great toe amputation 10/28 am Now on aspirin  Plavix per vascular-he was no longer on Eliquis  PTA and was only on aspirin  81 Continue multimodal pain management, dressing per podiatry. Plan for discharge once okay with podiatry Plan to change to Augmentin upon discharge to complete 5 days course  CKD stage IV/V , s/p renal transplant: Renal function remained stable at baseline, continue his prednisone , Prograf  and monitor-follow-up with outpatient nephrology Recent Labs    01/18/24 0542 01/19/24 0231 01/20/24 0215 01/21/24 9182 01/22/24 9795 01/23/24 0147 03/11/24 2114 03/12/24 1226 03/14/24 1557 03/15/24 0418  BUN 69* 65*  57* 53* 50* 47* 45*  --  51* 52*  CREATININE 4.39* 4.27* 4.01* 3.79* 3.79* 3.68* 3.41* 3.72* 3.57* 3.63*  CO2 18* 19* 20* 20* 20* 20* 20*  --  19* 19*  K 4.2 3.9 4.4 4.1 4.7 4.6 4.8  --  5.3* 4.9    PE hxL Previously on Eliquis .Patient was seen by his [facility] hematology on January 25 2024 and advised to stop Eliquis  and changed to aspirin .  Patient was not taking Eliquis  PTA. Now on DAPT for PVD and  postprocedure   HTN Stable cont Coreg  and lasix    HLD Cont atorvastatin     DM2 with uncontrolled hyperglycemia A1c 8.3 in 01/2024.  Continue Semglee  and adjust continue SSI.  Resume home regimen upon discharge Recent Labs  Lab 03/14/24 0801 03/14/24 1153 03/14/24 1517 03/14/24 2200 03/15/24 0730  GLUCAP 189* 196* 243* 275* 143*     Moderate malnutrition with/ Body mass index is 19.11 kg/m.: Will benefit dietary augmentation.    Mobility: consult PTOT PT Orders:  PT Follow up Rec:     DVT prophylaxis: heparin  injection 5,000 Units Start: 03/14/24 2200 SCD's Start: 03/13/24 2113Heparin drip Code Status:   Code Status: Full Code Family Communication: plan of care discussed with patient at bedside. Patient status is: Remains hospitalized because of severity of illness Level of care: Progressive Cardiac   Dispo: The patient is from: home            Anticipated disposition: Home today  Objective: Vitals last 24 hrs: Vitals:   03/14/24 2034 03/15/24 0100 03/15/24 0500 03/15/24 0732  BP: (!) 146/71   (!) 145/65  Pulse: 75     Resp: 20 18 18 17   Temp: (!) 97 F (36.1 C) 97.7 F (36.5 C) 98.4 F (36.9 C) 98.4 F (36.9 C)  TempSrc: Oral Oral Oral Oral  SpO2: 100%     Weight:      Height:        Physical Examination: General exam: alert awake, oriented x3 HEENT:Oral mucosa moist, Ear/Nose WNL grossly Respiratory system: Bilaterally clear BS,no use of accessory muscle Cardiovascular system: S1 & S2 +, No JVD. Gastrointestinal system: Abdomen soft,NT,ND, BS+ Nervous System: Alert, awake, moving all extremities,and following commands. Extremities: extremities warm, leg edema neg, RLE DRESSING PRESENT C/D/I  Skin: Warm, no rashes MSK: Normal muscle bulk,tone, power.    Procedure(s) (LRB): AMPUTATION, TOE (Right)  S/p Post angiography  Consultation: See note. Vascular Podiatry  Discharge Instructions  Discharge Instructions     Diet Carb Modified    Complete by: As directed    Discharge instructions   Complete by: As directed    Follow-up with vascular in 1 week-discussed with vascular regarding Plavix duration  Please call call MD or return to ER for similar or worsening recurring problem that brought you to hospital or if any fever,nausea/vomiting,abdominal pain, uncontrolled pain, chest pain,  shortness of breath or any other alarming symptoms.  Please follow-up your doctor as instructed in a week time and call the office for appointment.  Please avoid alcohol, smoking, or any other illicit substance and maintain healthy habits including taking your regular medications as prescribed.  You were cared for by a hospitalist during your hospital stay. If you have any questions about your discharge medications or the care you received while you were in the hospital after you are discharged, you can call the unit and ask to speak with the hospitalist on call if the hospitalist that took care of you is  not available.  Once you are discharged, your primary care physician will handle any further medical issues. Please note that NO REFILLS for any discharge medications will be authorized once you are discharged, as it is imperative that you return to your primary care physician (or establish a relationship with a primary care physician if you do not have one) for your aftercare needs so that they can reassess your need for medications and monitor your lab values   Discharge wound care:   Complete by: As directed    Keep the dressing on until she is seen in the clinic by podiatry next week   Increase activity slowly   Complete by: As directed       Allergies as of 03/15/2024       Reactions   Other Rash   Oxyquinoline-white Pet-lanolin   Aquamed Rash   Bag Balm  [albolene] Rash   Cold Cream Rash        Medication List     STOP taking these medications    apixaban  5 MG Tabs tablet Commonly known as: ELIQUIS    cefadroxil  500 MG  capsule Commonly known as: DURICEF       TAKE these medications    amLODipine  2.5 MG tablet Commonly known as: NORVASC  Take 2.5 mg by mouth daily.   amoxicillin-clavulanate 500-125 MG tablet Commonly known as: AUGMENTIN Take 1 tablet by mouth every 12 (twelve) hours.   aspirin  EC 81 MG tablet Take 81 mg by mouth daily.   atorvastatin  40 MG tablet Commonly known as: LIPITOR Take 1 tablet (40 mg total) by mouth daily.   Baqsimi One Pack 3 MG/DOSE Powd Generic drug: Glucagon Place 1 spray into the nose daily as needed for low blood sugar.   calcitRIOL  0.25 MCG capsule Commonly known as: ROCALTROL  Take 0.25 mcg by mouth every Monday, Wednesday, and Friday.   carvedilol  12.5 MG tablet Commonly known as: COREG  Take 12.5 mg by mouth 2 (two) times daily with a meal.   clopidogrel 75 MG tablet Commonly known as: PLAVIX Take 1 tablet (75 mg total) by mouth daily with breakfast. Start taking on: March 16, 2024   famotidine  20 MG tablet Commonly known as: PEPCID  Take 20 mg by mouth daily.   folic acid  1 MG tablet Commonly known as: FOLVITE  Take 1 mg by mouth daily.   furosemide  40 MG tablet Commonly known as: LASIX  Take 40 mg by mouth daily.   HumaLOG KwikPen 100 UNIT/ML KwikPen Generic drug: insulin  lispro Inject 0-15 Units into the skin 3 (three) times daily as needed (high blood sugar).   Lantus  SoloStar 100 UNIT/ML Solostar Pen Generic drug: insulin  glargine Inject 6 Units into the skin at bedtime.   Melatonin 10 MG Tabs Take 10 mg by mouth at bedtime.   predniSONE  5 MG tablet Commonly known as: DELTASONE  Take 5 mg by mouth daily with breakfast.   Semaglutide(0.25 or 0.5MG /DOS) 2 MG/3ML Sopn Inject 0.25 mg into the skin once a week. Sundays   sevelamer  carbonate 800 MG tablet Commonly known as: RENVELA    tacrolimus  1 MG capsule Commonly known as: PROGRAF  Take 3 mg by mouth 2 (two) times daily.   thiamine  100 MG tablet Commonly known as: VITAMIN  B1 Take 100 mg by mouth daily.               Discharge Care Instructions  (From admission, onward)           Start     Ordered  03/15/24 0000  Discharge wound care:       Comments: Keep the dressing on until she is seen in the clinic by podiatry next week   03/15/24 1001            Follow-up Information     [facility] Follow up.   Specialty: Home Health Services Why: Physical and Occupational Therapy, Registered Nurse-office to call with visit times. Contact information: [address] [place] [address] [phone]         Theophilus Andrews, Tully GRADE, MD Follow up in 1 week(s).   Specialty: Internal Medicine Contact information: [address] [place] [address] [phone]         Malvin Marsa FALCON, DPM Follow up in 1 week(s).   Specialty: Podiatry Contact information: [address] [place] [address] [phone]                Allergies  Allergen Reactions   Other Rash    Oxyquinoline-white Pet-lanolin   Aquamed Rash   Bag Balm  [Albolene] Rash   Cold Cream Rash    The results of significant diagnostics from this hospitalization (including imaging, microbiology, ancillary and laboratory) are listed below for reference.    Microbiology: Recent Results (from the past 240 hours)  Culture, blood (routine x 2)     Status: None (Preliminary result)   Collection Time: 03/12/24  7:49 AM   Specimen: BLOOD  Result Value Ref Range Status   Specimen Description BLOOD LEFT ANTECUBITAL  Final   Special Requests   Final    BOTTLES DRAWN AEROBIC AND ANAEROBIC Blood Culture results may not be optimal due to an inadequate volume of blood received in culture bottles   Culture   Final    NO GROWTH 3 DAYS Performed at [facility], [address] [place], [place] [address]    Report Status PENDING  Incomplete  Culture, blood (routine x 2)     Status: None  (Preliminary result)   Collection Time: 03/12/24  7:54 AM   Specimen: BLOOD LEFT WRIST  Result Value Ref Range Status   Specimen Description BLOOD LEFT WRIST  Final   Special Requests   Final    BOTTLES DRAWN AEROBIC AND ANAEROBIC Blood Culture results may not be optimal due to an inadequate volume of blood received in culture bottles   Culture   Final    NO GROWTH 3 DAYS Performed at [facility], [address], [place], [place] [address]    Report Status PENDING  Incomplete  Surgical pcr screen     Status: None   Collection Time: 03/14/24  5:50 AM   Specimen: Nasal Mucosa; Nasal Swab  Result Value Ref Range Status   MRSA, PCR NEGATIVE NEGATIVE Final   Staphylococcus aureus NEGATIVE NEGATIVE Final    Comment: (NOTE) The Xpert SA Assay (FDA approved for NASAL specimens in patients 77 years of age and older), is one component of a comprehensive surveillance program. It is not intended to diagnose infection nor to guide or monitor treatment. Performed at [facility], [address], [place], [place] [address]   Aerobic/Anaerobic Culture w Gram Stain (surgical/deep wound)     Status: None (Preliminary result)   Collection Time: 03/14/24  7:46 AM   Specimen: PATH Soft tissue  Result Value Ref Range Status   Specimen Description TISSUE  Final   Special Requests RIGHT GREAT  TOE  Final   Gram Stain   Final    RARE WBC PRESENT,BOTH PMN AND MONONUCLEAR RARE GRAM POSITIVE COCCI IN PAIRS    Culture   Final    MODERATE STAPHYLOCOCCUS AUREUS FEW CITROBACTER KOSERI MODERATE STAPHYLOCOCCUS LUGDUNENSIS SUSCEPTIBILITIES TO FOLLOW Performed at [facility], [address], [place], [place] [address]    Report Status PENDING  Incomplete    Procedures/Studies: DG Foot 2 Views Right Result Date: 03/14/2024 CLINICAL DATA:  Status post right great toe amputation. EXAM: RIGHT FOOT - 2 VIEW COMPARISON:  03/12/2024 FINDINGS: Interval amputation of the great toe at the level  of the distal aspect of the 1st proximal phalanx. Sharp bone margins without fracture. Extensive arterial calcifications. Mild-to-moderate 1st MTP joint degenerative changes. IMPRESSION: Interval amputation of the great toe at the level of the distal aspect of the 1st proximal phalanx. Electronically Signed   By: Elspeth Bathe M.D.   On: 03/14/2024 11:08   PERIPHERAL VASCULAR CATHETERIZATION Result Date: 03/13/2024 Images from the original result were not included. Patient name: LONI ABDON MRN: [id number] DOB: September 01, 1950 Sex: male 03/13/2024 Pre-operative Diagnosis: CLTI with right great toe wound Post-operative diagnosis:  Same Surgeon:  Norman GORMAN Serve, MD Procedure Performed: Ultrasound-guided access of left common artery Aortogram bilateral lower extremity angiogram Third order cannulation of right AT Intravascular lithotripsy of right AT, 3 mm shockwave E8 Balloon angioplasty of right AT, 3 mm coyote Mynx closure of left common femoral artery 83 minutes of moderate sedation with fentanyl  and Versed  Indications: 73 year old male who was admitted with right great toe wound and PAD.  He did not have palpable pedal pulses and had ischemic changes of the right foot associated with the wound.  Angiogram was offered, risks and benefits reviewed and he elected to proceed. Findings: Widely patent aorta and bilateral renal arteries.  Widely patent iliac systems bilaterally. Right common femoral, SFA and profunda are patent without flow-limiting stenosis.  The popliteal artery is patent without flow-limiting stenosis.  The PT is chronically occluded.  The AT is patent for a short segment but then occludes and is reconstituted which fills the DP on the foot.  The peroneal is diseased and occludes in its proximal segment. Left common femoral, SFA and profunda are patent without flow-limiting stenosis.  The popliteal artery is patent without flow-limiting stenosis.  There is significant tibial disease in all 3  vessels with reconstitution distally.  Procedure:  The patient was identified in the holding area and taken to the cath lab  The patient was then placed supine on the table and prepped and draped in the usual sterile fashion.  A time out was called.  Ultrasound was used to evaluate the left common femoral artery.  It was patent .  A digital ultrasound image was acquired.  A micropuncture needle was used to access the left common femoral artery under ultrasound guidance.  An 018 wire was advanced without resistance and a micropuncture sheath was placed.  The 018 wire was removed and a benson wire was placed.  The micropuncture sheath was exchanged for a 5 french sheath.  An omniflush catheter was advanced over the wire to the level of L-1.  An abdominal CO2 angiogram was obtained.  Next, using the omniflush catheter and a glide advantage wire, the aortic bifurcation was crossed and the catheter was placed into theright external iliac artery and right runoff was obtained. This demonstrated the above findings.  The glide advantage wire was then replaced through  the catheter and into the SFA.  The 5 French sheath was then exchanged for 6 French by 45 cm catapult sheath and the patient was systemically heparinized.  Using an 014 command wire and CXI catheter I was able to navigate into the AT and the heavily diseased AT was crossed and a wire was placed into the distal AT near the ankle.  An angiogram.  The catheter demonstrated true lumen access distally.  I then performed multiple rounds of intravascular lithotripsy with the 3 mm shockwave E8 balloon at the proximal portion of the AT.  It should be noted that the balloon would not track past the first 15 cm due to diffuse disease.  And then attempted to predilate the mid and distal segment of the AT with a 2 mm coyote balloon.  At the shockwave balloon still would not track into the distal segment of the AT and therefore I used a 3 mm x 220 coyote balloon to treat the  entire AT.  A short segment in the distal aspect was restenotic and therefore a 3 mm x 80 mm balloon was used in this area.  Completion angiogram demonstrated a good result with inline flow to the foot.  The wire was removed and replaced with a Bentson wire.  The long 6 French sheath was exchanged for a short 6 French sheath and left runoff was performed via retrograde sheath injections which demonstrated the above findings.  A Mynx closure device was then deployed with excellent hemostasis. Contrast: 75 cc Sedation: 83 minutes Impression: Maximally revascularized on the right with inline flow to the foot via the AT. Norman GORMAN Serve MD [facility] Office: [phone]  DG Foot Complete Right Result Date: 03/12/2024 EXAM: 3 OR MORE VIEW(S) XRAY OF THE RIGHT FOOT 03/12/2024 08:33:00 AM COMPARISON: Comparison right foot radiographs 07/14/2022. CLINICAL HISTORY: 73 year old male. Right great toe infection. Wound on right great toe x1 week. History of diabetes. FINDINGS: BONES AND JOINTS: Mild erosive changes of the tuft of the first distal phalanx , new and most compatible with osteomyelitis. Calcaneal spur. No joint dislocation. Moderate degenerative changes of the first MTP joint. SOFT TISSUES: Advanced calcified peripheral vascular disease. Soft tissue irregularity at the distal great toe. Vascular calcifications. IMPRESSION: 1. Erosion of the first distal phalanx tuft consistent with osteomyelitis. 2. Severe calcified peripheral vascular disease. Electronically signed by: Helayne Hurst MD 03/12/2024 08:52 AM EDT RP Workstation: [id number]    Labs: BNP (last 3 results) No results for input(s): BNP in the last 8760 hours. Basic Metabolic Panel: Recent Labs  Lab 03/11/24 2114 03/12/24 1226 03/14/24 1557 03/15/24 0418  NA 139  --  137 135  K 4.8  --  5.3* 4.9  CL 104  --  107 107  CO2 20*  --  19* 19*  GLUCOSE 84  --  224* 201*  BUN 45*  --  51* 52*  CREATININE  3.41* 3.72* 3.57* 3.63*  CALCIUM  9.3  --  8.5* 9.1   Liver Function Tests: Recent Labs  Lab 03/11/24 2114  AST 13*  ALT 8  ALKPHOS 31*  BILITOT 0.9  PROT 6.9  ALBUMIN 3.5   No results for input(s): LIPASE, AMYLASE in the last 168 hours. No results for input(s): AMMONIA in the last 168 hours. CBC: Recent Labs  Lab 03/11/24 2114 03/12/24 1226 03/13/24 0449 03/14/24 0421 03/15/24 0418  WBC 9.7 6.6 6.7 5.7 6.3  NEUTROABS 8.1*  --   --   --   --  HGB 9.6* 9.9* 9.5* 8.8* 8.3*  HCT 31.3* 32.1* 30.4* 28.3* 26.7*  MCV 90.2 89.2 88.4 88.4 87.0  PLT 188 191 207 183 199   CBG: Recent Labs  Lab 03/14/24 0801 03/14/24 1153 03/14/24 1517 03/14/24 2200 03/15/24 0730  GLUCAP 189* 196* 243* 275* 143*   Hgb A1c No results for input(s): HGBA1C in the last 72 hours. Anemia work up No results for input(s): VITAMINB12, FOLATE, FERRITIN, TIBC, IRON, RETICCTPCT in the last 72 hours. Cardiac Enzymes: No results for input(s): CKTOTAL, CKMB, CKMBINDEX, TROPONINI in the last 168 hours. BNP: Invalid input(s): POCBNP D-Dimer No results for input(s): DDIMER in the last 72 hours. Lipid Profile Recent Labs    03/14/24 0421  CHOL 138  HDL 44  LDLCALC 74  TRIG 98  CHOLHDL 3.1   Thyroid  function studies No results for input(s): TSH, T4TOTAL, T3FREE, THYROIDAB in the last 72 hours.  Invalid input(s): FREET3 Urinalysis    Component Value Date/Time   COLORURINE YELLOW 01/20/2024 1233   APPEARANCEUR CLOUDY (A) 01/20/2024 1233   LABSPEC 1.010 01/20/2024 1233   PHURINE 6.0 01/20/2024 1233   GLUCOSEU 50 (A) 01/20/2024 1233   HGBUR MODERATE (A) 01/20/2024 1233   HGBUR trace-lysed 06/16/2010 0833   BILIRUBINUR NEGATIVE 01/20/2024 1233   BILIRUBINUR n 05/22/2015 0927   KETONESUR NEGATIVE 01/20/2024 1233   PROTEINUR 100 (A) 01/20/2024 1233   UROBILINOGEN 0.2 05/22/2015 0927   UROBILINOGEN 0.2 07/31/2014 2130   NITRITE NEGATIVE 01/20/2024  1233   LEUKOCYTESUR LARGE (A) 01/20/2024 1233   Sepsis Labs Recent Labs  Lab 03/12/24 1226 03/13/24 0449 03/14/24 0421 03/15/24 0418  WBC 6.6 6.7 5.7 6.3   Microbiology Recent Results (from the past 240 hours)  Culture, blood (routine x 2)     Status: None (Preliminary result)   Collection Time: 03/12/24  7:49 AM   Specimen: BLOOD  Result Value Ref Range Status   Specimen Description BLOOD LEFT ANTECUBITAL  Final   Special Requests   Final    BOTTLES DRAWN AEROBIC AND ANAEROBIC Blood Culture results may not be optimal due to an inadequate volume of blood received in culture bottles   Culture   Final    NO GROWTH 3 DAYS Performed at [facility], [address] [place], [place] [address]    Report Status PENDING  Incomplete  Culture, blood (routine x 2)     Status: None (Preliminary result)   Collection Time: 03/12/24  7:54 AM   Specimen: BLOOD LEFT WRIST  Result Value Ref Range Status   Specimen Description BLOOD LEFT WRIST  Final   Special Requests   Final    BOTTLES DRAWN AEROBIC AND ANAEROBIC Blood Culture results may not be optimal due to an inadequate volume of blood received in culture bottles   Culture   Final    NO GROWTH 3 DAYS Performed at [facility], [address], [place], [place] [address]    Report Status PENDING  Incomplete  Surgical pcr screen     Status: None   Collection Time: 03/14/24  5:50 AM   Specimen: Nasal Mucosa; Nasal Swab  Result Value Ref Range Status   MRSA, PCR NEGATIVE NEGATIVE Final   Staphylococcus aureus NEGATIVE NEGATIVE Final    Comment: (NOTE) The Xpert SA Assay (FDA approved for NASAL specimens in patients 38 years of age and older), is one component of a comprehensive surveillance program. It is not intended to diagnose infection nor to guide or monitor treatment.  Performed at [facility], [address] [place], [place] [address]   Aerobic/Anaerobic Culture w Gram Stain (surgical/deep wound)      Status: None (Preliminary result)   Collection Time: 03/14/24  7:46 AM   Specimen: PATH Soft tissue  Result Value Ref Range Status   Specimen Description TISSUE  Final   Special Requests RIGHT GREAT TOE  Final   Gram Stain   Final    RARE WBC PRESENT,BOTH PMN AND MONONUCLEAR RARE GRAM POSITIVE COCCI IN PAIRS    Culture   Final    MODERATE STAPHYLOCOCCUS AUREUS FEW CITROBACTER KOSERI MODERATE STAPHYLOCOCCUS LUGDUNENSIS SUSCEPTIBILITIES TO FOLLOW Performed at [facility], [address], [place], [place] [address]    Report Status PENDING  Incomplete     Time coordinating discharge: 35 minutes  SIGNED: Mennie LAMY, MD  [facility] 03/15/2024, 10:49 AM  If 7PM-7AM, please contact night-coverage [email or web address]

## 2024-03-15 NOTE — Care Management Important Message (Signed)
 Important Message  Patient Details  Name: [name] [name] MRN: [id number] Date of Birth: May 12, 1951   Important Message Given:  Yes - Medicare IM     [name] Sharps 03/15/2024, 11:13 AM

## 2024-03-15 NOTE — Evaluation (Signed)
 Occupational Therapy Evaluation Patient Details Name: [name] [name] MRN: [id number] DOB: December 11, 1950 Today's Date: 03/15/2024   History of Present Illness   73 y/o M presenting to ED on 10/25 with R big toe infection/pain, s/p BLE angiogram with ballon angioplasty of R AT10/27. S/p Amputation of R great toe on 10/28.    PMH includes renal transplant on immunosuppressive therapy, PE on Eliquis , HTN, HLD, DM2, recent admit 01/2024 for Proteus/Klebsiella UTI     Clinical Impressions Pt ind at baseline with ADLs, and uses cane or RW for community mobility when he is unsure of terrain. Pt currenlty with minimal R foot pain, needs up to min A for ADLs, CGA for bed mobility and min A for transfers with RW. Pt with mild unsteadiness due to discrepancy from postop shoe. Pt educated on compensatory strategies for bathing and pt educated on donning/doffing postop shoe and when to wear. Pt presenting with impairments listed below, will follow acutely. Recommend HHOT at d/c.      If plan is discharge home, recommend the following:   A little help with walking and/or transfers;A little help with bathing/dressing/bathroom;Assistance with cooking/housework;Assist for transportation;Help with stairs or ramp for entrance     Functional Status Assessment   Patient has had a recent decline in their functional status and demonstrates the ability to make significant improvements in function in a reasonable and predictable amount of time.     Equipment Recommendations   None recommended by OT     Recommendations for Other Services   PT consult     Precautions/Restrictions   Precautions Precautions: Fall Precaution/Restrictions Comments: RLE postop shoe Restrictions Weight Bearing Restrictions Per Provider Order: Yes RLE Weight Bearing Per Provider Order: Weight bearing as tolerated Other Position/Activity Restrictions: in postop shoe     Mobility Bed Mobility                     Transfers Overall transfer level: Needs assistance (Simultaneous filing. User may not have seen previous data.) Equipment used: Rolling walker (2 wheels) (Simultaneous filing. User may not have seen previous data.) Transfers: Sit to/from Stand (Simultaneous filing. User may not have seen previous data.) Sit to Stand: Min assist (Simultaneous filing. User may not have seen previous data.)                  Balance Overall balance assessment: Needs assistance (Simultaneous filing. User may not have seen previous data.) Sitting-balance support: Feet supported (Simultaneous filing. User may not have seen previous data.) Sitting balance-Leahy Scale: Good (Simultaneous filing. User may not have seen previous data.)     Standing balance support: During functional activity, Reliant on assistive device for balance (Simultaneous filing. User may not have seen previous data.) Standing balance-Leahy Scale: Fair (Simultaneous filing. User may not have seen previous data.) Standing balance comment: static standing without AD (Simultaneous filing. User may not have seen previous data.)                           ADL either performed or assessed with clinical judgement   ADL Overall ADL's : Needs assistance/impaired Eating/Feeding: Set up   Grooming: Set up   Upper Body Bathing: Minimal assistance   Lower Body Bathing: Minimal assistance   Upper Body Dressing : Minimal assistance   Lower Body Dressing: Minimal assistance   Toilet Transfer: Contact guard assist;Ambulation;Rolling walker (2 wheels)   Toileting- Clothing Manipulation and Hygiene: Contact guard assist  Functional mobility during ADLs: Contact guard assist;Rolling walker (2 wheels)       Vision   Vision Assessment?: No apparent visual deficits     Perception Perception: Not tested       Praxis Praxis: Not tested       Pertinent Vitals/Pain       Extremity/Trunk Assessment Upper  Extremity Assessment Upper Extremity Assessment: Defer to OT evaluation   Lower Extremity Assessment Lower Extremity Assessment: RLE deficits/detail RLE Deficits / Details: reports diminished sensation in medial foot and toes, but intact elsewhere; s/p great toe amputation   Cervical / Trunk Assessment Cervical / Trunk Assessment: Normal   Communication Communication Communication: No apparent difficulties   Cognition     Cognition: No family/caregiver present to determine baseline             OT - Cognition Comments: at times slowed processing/responses, A & O x4, not formally assessed                         Cueing  General Comments      VSS on RA (Simultaneous filing. User may not have seen previous data.)   Exercises     Shoulder Instructions      Home Living Family/patient expects to be discharged to:: Private residence Living Arrangements: Spouse/significant other;Children (daughter & grandchildren) Available Help at Discharge: Family;Available 24 hours/day Type of Home: House Home Access: Stairs to enter [employer] of Steps: 2 Entrance Stairs-Rails: Right;Left;Can reach both Home Layout: One level     Bathroom Shower/Tub: Chief Strategy Officer: Standard     Home Equipment: Agricultural Consultant (2 wheels);Rollator (4 wheels);Cane - single point;Tub bench          Prior Functioning/Environment Prior Level of Function : Independent/Modified Independent             Mobility Comments: no AD typically, but occasionally using cane/RW if in community ADLs Comments: ind    OT Problem List: Decreased strength;Decreased range of motion;Decreased activity tolerance;Impaired balance (sitting and/or standing);Decreased cognition;Decreased safety awareness   OT Treatment/Interventions: Self-care/ADL training;Therapeutic exercise;Energy conservation;DME and/or AE instruction;Therapeutic activities;Patient/family  education;Balance training      OT Goals(Current goals can be found in the care plan section)   Acute Rehab OT Goals Patient Stated Goal: none stated OT Goal Formulation: With patient Time For Goal Achievement: 03/29/24 Potential to Achieve Goals: Good ADL Goals Pt Will Perform Upper Body Dressing: Independently;sitting Pt Will Perform Lower Body Dressing: Independently;sitting/lateral leans Pt Will Transfer to Toilet: Independently;ambulating;regular height toilet Pt Will Perform Tub/Shower Transfer: Tub transfer;Shower transfer;Independently;ambulating;tub bench;rolling walker   OT Frequency:  Min 2X/week    Co-evaluation              AM-PAC OT 6 Clicks Daily Activity     Outcome Measure Help from another person eating meals?: None Help from another person taking care of personal grooming?: None Help from another person toileting, which includes using toliet, bedpan, or urinal?: A Little Help from another person bathing (including washing, rinsing, drying)?: A Little Help from another person to put on and taking off regular upper body clothing?: A Little Help from another person to put on and taking off regular lower body clothing?: A Little 6 Click Score: 20   End of Session Equipment Utilized During Treatment: Gait belt;Rolling walker (2 wheels) Nurse Communication: Mobility status  Activity Tolerance: Patient tolerated treatment well Patient left: with call bell/phone within reach;in chair  OT  Visit Diagnosis: Unsteadiness on feet (R26.81);Other abnormalities of gait and mobility (R26.89);Muscle weakness (generalized) (M62.81)                Time: 9172-9141 OT Time Calculation (min): 31 min Charges:  OT General Charges $OT Visit: 1 Visit OT Evaluation $OT Eval Low Complexity: 1 Low OT Treatments $Self Care/Home Management : 8-22 mins  Erial Fikes K, OTD, OTR/L SecureChat Preferred Acute Rehab [phone]   Laneta POUR Koonce 03/15/2024, 9:58 AM

## 2024-03-16 ENCOUNTER — Telehealth: Payer: Self-pay

## 2024-03-16 NOTE — Transitions of Care (Post Inpatient/ED Visit) (Signed)
   03/16/2024  Name: [name] [name] MRN: [id number] DOB: 05-Jun-1950  Today's TOC FU Call Status: Today's TOC FU Call Status:: Successful TOC FU Call Completed TOC FU Call Complete Date: 03/16/24 Patient's Name and Date of Birth confirmed.  Transition Care Management Follow-up Telephone Call Date of Discharge: 03/15/24 Discharge Facility: [facility]) Type of Discharge: Inpatient Admission Primary Inpatient Discharge Diagnosis:: Skin ulcer of right great toe, unspecified ulcer stage How have you been since you were released from the hospital?: Better (I am feeling well.) Any questions or concerns?: No  Items Reviewed: Did you receive and understand the discharge instructions provided?: Yes Medications obtained,verified, and reconciled?: No (reports daughter manages medications and declines review.) Any new allergies since your discharge?: No  Medications Reviewed Today: declined review. Medications Reviewed Today   Medications were not reviewed in this encounter     Reviewed with patient the reason for call. Patient reports that he has reviewed his instructions twice already.  States that he will speak with his daughter and he if she wants to review them again they will call me back.  I voiced understanding.     Alan Ee, RN, BSN, CEN [employer]- Transition of Care Team.  [facility] [phone]

## 2024-03-17 ENCOUNTER — Other Ambulatory Visit: Payer: Self-pay | Admitting: Podiatry

## 2024-03-17 LAB — CULTURE, BLOOD (ROUTINE X 2)
Culture: NO GROWTH
Culture: NO GROWTH

## 2024-03-17 LAB — AEROBIC/ANAEROBIC CULTURE W GRAM STAIN (SURGICAL/DEEP WOUND)

## 2024-03-17 LAB — SURGICAL PATHOLOGY

## 2024-03-17 MED ORDER — SULFAMETHOXAZOLE-TRIMETHOPRIM 800-160 MG PO TABS: 1.0000 | ORAL_TABLET | Freq: Two times a day (BID) | ORAL | 0 refills | Status: AC

## 2024-03-21 ENCOUNTER — Other Ambulatory Visit: Payer: Self-pay

## 2024-03-21 ENCOUNTER — Ambulatory Visit: Admitting: Podiatry

## 2024-03-21 ENCOUNTER — Encounter: Payer: Self-pay | Admitting: Podiatry

## 2024-03-21 DIAGNOSIS — L97519 Non-pressure chronic ulcer of other part of right foot with unspecified severity: Secondary | ICD-10-CM

## 2024-03-21 DIAGNOSIS — M869 Osteomyelitis, unspecified: Secondary | ICD-10-CM | POA: Diagnosis not present

## 2024-03-21 DIAGNOSIS — Z9889 Other specified postprocedural states: Secondary | ICD-10-CM | POA: Diagnosis not present

## 2024-03-21 NOTE — Progress Notes (Signed)
  Subjective:  Patient ID: [name] [name], male    DOB: 21-Apr-1951,  MRN: [id number]  Chief Complaint  Patient presents with   Wound Check    :POV#1 PARTIAL AMPUTATION RIGHT GREAT TOE.SABRA 0 pain.  Wearing surgical shoe.     DOS: 03/14/2024 Procedure: Amputation right great toe proximal phalanx head level  73 y.o. male seen for post op check.  Patient presents for first postop check approximately 1 week after above procedure.  He is doing well denies pain.  Has left dressing clean dry and intact as instructed using postop shoe and finish course of postoperative antibiotics.  Review of Systems: Negative except as noted in the HPI. Denies N/V/F/Ch.   Objective:   Constitutional Well developed. Well nourished.  Vascular Foot warm and well perfused. Capillary refill normal to all digits.   No calf pain with palpation  Neurologic Normal speech. Oriented to person, place, and time. Epicritic sensation diminished right foot  Dermatologic Amputation site well coapted no drainage maceration or evidence of necrosis or dehiscence   Orthopedic: Status post partial right great toe amputation   Radiographs: Interval amputation of the great toe at the level of the distal aspect of the 1st proximal phalanx.  Pathology: A. TOE, RIGHT GREAT, AMPUTATION:  - Skin and soft tissue with chronic ulceration, necrosis, and underlying acute osteomyelitis.  - Separate fragment of unremarkable bone; negative for osteomyelitis.   Micro: MODERATE STAPHYLOCOCCUS AUREUS  FEW CITROBACTER KOSERI  MODERATE STAPHYLOCOCCUS LUGDUNENSIS   Assessment:   1. Osteomyelitis of great toe of right foot ([name])   2. Post-operative state     Plan:  Patient was evaluated and treated and all questions answered.  POD # 7 s/p right great toe amputation proximal phalanx at level -Progressing as expected postoperatively amputation site healing very well with no evidence of residual infection or necrosis -XR: Expected  postoperative changes -WB Status: Weightbearing as tolerated in postop shoe -Sutures: Remain intact until next week. -Medications/ABX: Finish course of postoperative antibiotics -Dressing: Leave new Xeroform dressing applied today until next appointment - F/u Plan: Follow-up in 1 week for suture removal        Marolyn JULIANNA Honour, DPM [facility] / [facility]

## 2024-03-22 ENCOUNTER — Encounter: Payer: Self-pay | Admitting: Internal Medicine

## 2024-03-22 ENCOUNTER — Ambulatory Visit (INDEPENDENT_AMBULATORY_CARE_PROVIDER_SITE_OTHER): Admitting: Internal Medicine

## 2024-03-22 VITALS — BP 131/63 | HR 70 | Temp 97.7°F | Wt 140.3 lb

## 2024-03-22 DIAGNOSIS — E1140 Type 2 diabetes mellitus with diabetic neuropathy, unspecified: Secondary | ICD-10-CM

## 2024-03-22 DIAGNOSIS — M869 Osteomyelitis, unspecified: Secondary | ICD-10-CM

## 2024-03-22 DIAGNOSIS — Z794 Long term (current) use of insulin: Secondary | ICD-10-CM | POA: Diagnosis not present

## 2024-03-22 DIAGNOSIS — Z09 Encounter for follow-up examination after completed treatment for conditions other than malignant neoplasm: Secondary | ICD-10-CM

## 2024-03-22 LAB — POCT GLYCOSYLATED HEMOGLOBIN (HGB A1C): Hemoglobin A1C: 9.2 % — AB (ref 4.0–5.6)

## 2024-03-22 NOTE — Progress Notes (Signed)
 Hospital follow-up visit     CC/Reason for Visit: Hospitalization follow-up  HPI: [name] [name] is a 73 y.o. male who is coming in today for the above mentioned reasons, specifically transitional care services face-to-face visit.    Dates hospitalized: 10/25-10/29/2025 Days since discharge from hospital: 6 Patient was discharged from the hospital to: Home Reason for admission to hospital: Nonhealing ulcer right big toe Date of interactive phone contact with patient and/or caregiver: 03/16/2024  I have reviewed in detail patient's discharge summary plus pertinent specific notes, labs, and images from the hospitalization.  Yes  Patient was admitted due to a nonhealing ulcer of the right great toe.  Images showed osteomyelitis and he subsequently underwent a right partial great toe amputation.  He has finished his antibiotic.  He has followed up with podiatry.  Medication reconciliation was done today and patient is taking meds as recommended by discharging hospitalist/specialist.  Yes   Past Medical/Surgical History: Past Medical History:  Diagnosis Date   Anemia    as a younger man   Arthritis    hands   Cataract    Chronic kidney disease    starting to bother me now (07/28/2012)   Diabetes mellitus     type II   Diarrhea, functional    ED (erectile dysfunction)    Hypertension    Neuropathy    Peripheral neuropathy     Past Surgical History:  Procedure Laterality Date   ABDOMINAL AORTOGRAM W/LOWER EXTREMITY N/A 03/13/2024   Procedure: ABDOMINAL AORTOGRAM W/LOWER EXTREMITY;  Surgeon: Pearline Norman RAMAN, MD;  Location: [facility] INVASIVE CV LAB;  Service: Cardiovascular;  Laterality: N/A;   AMPUTATION TOE Right 03/14/2024   Procedure: AMPUTATION, TOE;  Surgeon: Malvin Marsa FALCON, DPM;  Location: [facility] OR;  Service: Orthopedics/Podiatry;  Laterality: Right;  RIGHT GREAT TOE   APPENDECTOMY  1950's   AV FISTULA PLACEMENT Right 05/14/2016   Procedure: ARTERIOVENOUS  (AV) FISTULA CREATION- RIGHT ARM;  Surgeon: Gaile LELON New, MD;  Location: [facility] OR;  Service: Vascular;  Laterality: Right;   COLONOSCOPY     KIDNEY TRANSPLANT Right    LOWER EXTREMITY ANGIOGRAPHY Right 03/13/2024   Procedure: Lower Extremity Angiography;  Surgeon: Pearline Norman RAMAN, MD;  Location: [facility] INVASIVE CV LAB;  Service: Cardiovascular;  Laterality: Right;   LOWER EXTREMITY INTERVENTION  03/13/2024   Procedure: LOWER EXTREMITY INTERVENTION;  Surgeon: Pearline Norman RAMAN, MD;  Location: [facility] INVASIVE CV LAB;  Service: Cardiovascular;;   PERIPHERAL INTRAVASCULAR LITHOTRIPSY  03/13/2024   Procedure: PERIPHERAL INTRAVASCULAR LITHOTRIPSY;  Surgeon: Pearline Norman RAMAN, MD;  Location: [facility] INVASIVE CV LAB;  Service: Cardiovascular;;   PROSTATE SURGERY     UPPER GASTROINTESTINAL ENDOSCOPY      Social History:  reports that he quit smoking about 37 years ago. His smoking use included cigarettes. He started smoking about 47 years ago. He has a 1.2 pack-year smoking history. He has never used smokeless tobacco. He reports that he does not currently use alcohol after a past usage of about 1.0 standard drink of alcohol per week. He reports that he does not use drugs.  Allergies: Allergies  Allergen Reactions   Other Rash    Oxyquinoline-white Pet-lanolin   Aquamed Rash   Bag Balm  [Albolene] Rash   Cold Cream Rash    Family History:  Family History  Problem Relation Age of Onset   Colon polyps Mother    Stroke Mother    Diabetes Father    Diabetes Cousin  Prostate cancer Brother    Other Daughter        kidney problems   Esophageal cancer Maternal Aunt    Colon cancer Neg Hx    Stomach cancer Neg Hx    Rectal cancer Neg Hx      Current Outpatient Medications:    amLODipine  (NORVASC ) 2.5 MG tablet, Take 2.5 mg by mouth daily., Disp: , Rfl:    aspirin  EC 81 MG tablet, Take 81 mg by mouth daily., Disp: , Rfl:    atorvastatin  (LIPITOR) 40 MG tablet, Take 1 tablet (40 mg total) by mouth  daily., Disp: 30 tablet, Rfl: 0   BAQSIMI ONE PACK 3 MG/DOSE POWD, Place 1 spray into the nose daily as needed for low blood sugar., Disp: , Rfl:    calcitRIOL  (ROCALTROL ) 0.25 MCG capsule, Take 0.25 mcg by mouth every Monday, Wednesday, and Friday., Disp: , Rfl:    carvedilol  (COREG ) 12.5 MG tablet, Take 12.5 mg by mouth 2 (two) times daily with a meal., Disp: , Rfl:    clopidogrel (PLAVIX) 75 MG tablet, Take 1 tablet (75 mg total) by mouth daily with breakfast., Disp: 30 tablet, Rfl: 0   famotidine  (PEPCID ) 20 MG tablet, Take 20 mg by mouth daily., Disp: , Rfl:    folic acid  (FOLVITE ) 1 MG tablet, Take 1 mg by mouth daily., Disp: , Rfl:    furosemide  (LASIX ) 40 MG tablet, Take 40 mg by mouth daily., Disp: , Rfl:    insulin  glargine (LANTUS  SOLOSTAR) 100 UNIT/ML Solostar Pen, Inject 6 Units into the skin at bedtime., Disp: , Rfl:    insulin  lispro (HUMALOG KWIKPEN) 100 UNIT/ML KwikPen, Inject 0-15 Units into the skin 3 (three) times daily as needed (high blood sugar)., Disp: , Rfl:    Melatonin 10 MG TABS, Take 10 mg by mouth at bedtime., Disp: , Rfl:    predniSONE  (DELTASONE ) 5 MG tablet, Take 5 mg by mouth daily with breakfast., Disp: , Rfl:    Semaglutide,0.25 or 0.5MG /DOS, 2 MG/3ML SOPN, Inject 0.25 mg into the skin once a week. Sundays, Disp: , Rfl:    sevelamer  carbonate (RENVELA ) 800 MG tablet, , Disp: , Rfl:    sulfamethoxazole-trimethoprim (BACTRIM DS) 800-160 MG tablet, Take 1 tablet by mouth 2 (two) times daily for 5 days., Disp: 10 tablet, Rfl: 0   tacrolimus  (PROGRAF ) 1 MG capsule, Take 3 mg by mouth 2 (two) times daily., Disp: , Rfl:    thiamine  100 MG tablet, Take 100 mg by mouth daily., Disp: , Rfl:   Review of Systems:  Negative except as mentioned in HPI.    Physical Exam: Vitals:   03/22/24 0842 03/22/24 0847  BP: (!) 140/80 131/63  Pulse: 70   Temp: 97.7 F (36.5 C)   TempSrc: Oral   SpO2: 99%   Weight: 140 lb 4.8 oz (63.6 kg)     Body mass index is 20.13  kg/m.   Physical Exam Vitals reviewed.  Constitutional:      Appearance: Normal appearance.  HENT:     Head: Normocephalic and atraumatic.  Eyes:     Conjunctiva/sclera: Conjunctivae normal.     Pupils: Pupils are equal, round, and reactive to light.  Cardiovascular:     Rate and Rhythm: Normal rate and regular rhythm.  Pulmonary:     Effort: Pulmonary effort is normal.     Breath sounds: Normal breath sounds.  Skin:    General: Skin is warm and dry.  Neurological:  General: No focal deficit present.     Mental Status: He is alert and oriented to person, place, and time.  Psychiatric:        Mood and Affect: Mood normal.        Behavior: Behavior normal.        Thought Content: Thought content normal.        Judgment: Judgment normal.     Impression and Plan:  Hospital discharge follow-up  Type 2 diabetes mellitus with diabetic neuropathy, without long-term current use of insulin  ([name]) - Plan: POC HgB A1c, Ambulatory referral to Endocrinology  Osteomyelitis of great toe of right foot [facility])  Hospital charts reviewed in detail.  He has completed antibiotic therapy and followed up already with podiatry with plans to follow-up again next week. - His A1c is very elevated at 9.2.  Increase Lantus  from 6 to 10 units, referral placed for endocrinology.  Medical decision making of high complexity was utilized today.     Tully Theophilus Andrews, MD [place] [facility]

## 2024-03-30 ENCOUNTER — Ambulatory Visit: Admitting: Podiatry

## 2024-03-30 ENCOUNTER — Encounter: Payer: Self-pay | Admitting: Podiatry

## 2024-03-30 DIAGNOSIS — M869 Osteomyelitis, unspecified: Secondary | ICD-10-CM | POA: Diagnosis not present

## 2024-03-30 DIAGNOSIS — Z9889 Other specified postprocedural states: Secondary | ICD-10-CM | POA: Diagnosis not present

## 2024-03-30 NOTE — Progress Notes (Signed)
  Subjective:  Patient ID: [name] [name], male    DOB: July 19, 1950,  MRN: [id number]  Chief Complaint  Patient presents with   Routine Post Op    POV#2 AMPUTATION RIGHT GREAT TOE. 0 pain. A1C 9.2. Wearing surgical shoe.    DOS: 03/14/2024 Procedure: Amputation right great toe proximal phalanx head level  73 y.o. male seen for post op check.  Patient presents for first postop check approximately 2 week after above procedure.   He denies pain.  Doing well.  Walking in postop shoe  Review of Systems: Negative except as noted in the HPI. Denies N/V/F/Ch.   Objective:   Constitutional Well developed. Well nourished.  Vascular Foot warm and well perfused. Capillary refill normal to all digits.   No calf pain with palpation  Neurologic Normal speech. Oriented to person, place, and time. Epicritic sensation diminished right foot  Dermatologic Amputation site well coapted no drainage maceration or evidence of necrosis or dehiscence   Orthopedic: Status post partial right great toe amputation   Radiographs: Interval amputation of the great toe at the level of the distal aspect of the 1st proximal phalanx.  Pathology: A. TOE, RIGHT GREAT, AMPUTATION:  - Skin and soft tissue with chronic ulceration, necrosis, and underlying acute osteomyelitis.  - Separate fragment of unremarkable bone; negative for osteomyelitis.   Micro: MODERATE STAPHYLOCOCCUS AUREUS  FEW CITROBACTER KOSERI  MODERATE STAPHYLOCOCCUS LUGDUNENSIS   Assessment:   1. Osteomyelitis of great toe of right foot ([name])   2. Post-operative state      Plan:  Patient was evaluated and treated and all questions answered.  2 weeks s/p right great toe amputation proximal phalanx at level -Progressing as expected postoperatively amputation site healing very well with no evidence of residual infection or necrosis -XR: Expected postoperative changes -WB Status: Weightbearing as tolerated in postop shoe -Sutures: Removed  in total today -Medications/ABX: Finish course of postoperative antibiotics -Dressing: Okay now for washing the foot with warm soapy water dry after and then apply Band-Aid dressing as needed - F/u Plan: Follow-up in 4 weeks for final recheck        Marolyn JULIANNA Honour, DPM [facility] / [facility]

## 2024-04-27 ENCOUNTER — Ambulatory Visit (INDEPENDENT_AMBULATORY_CARE_PROVIDER_SITE_OTHER): Admitting: Podiatry

## 2024-04-27 ENCOUNTER — Encounter: Payer: Self-pay | Admitting: Podiatry

## 2024-04-27 DIAGNOSIS — Z9889 Other specified postprocedural states: Secondary | ICD-10-CM | POA: Diagnosis not present

## 2024-04-27 DIAGNOSIS — E1142 Type 2 diabetes mellitus with diabetic polyneuropathy: Secondary | ICD-10-CM | POA: Diagnosis not present

## 2024-04-27 DIAGNOSIS — M869 Osteomyelitis, unspecified: Secondary | ICD-10-CM | POA: Diagnosis not present

## 2024-04-27 NOTE — Progress Notes (Signed)
°  Subjective:  Patient ID: [name] [name], male    DOB: 07-22-1950,  MRN: [id number]  No chief complaint on file.   DOS: 03/14/2024 Procedure: Amputation right great toe proximal phalanx head level  73 y.o. male seen for post op check.  Patient presents for right great toe amputation of proximal phalanx head level now approximately 5 to 6 weeks out.  Denies drainage walking in regular shoes  Review of Systems: Negative except as noted in the HPI. Denies N/V/F/Ch.   Objective:   Constitutional Well developed. Well nourished.  Vascular Foot warm and well perfused. Capillary refill normal to all digits.   No calf pain with palpation  Neurologic Normal speech. Oriented to person, place, and time. Epicritic sensation diminished right foot  Dermatologic Amputation site fully healed with some dry skin eschar overlying the amputation line which I removed fully healed underlying    Orthopedic: Status post partial right great toe amputation   Radiographs: Interval amputation of the great toe at the level of the distal aspect of the 1st proximal phalanx.  Pathology: A. TOE, RIGHT GREAT, AMPUTATION:  - Skin and soft tissue with chronic ulceration, necrosis, and underlying acute osteomyelitis.  - Separate fragment of unremarkable bone; negative for osteomyelitis.   Micro: MODERATE STAPHYLOCOCCUS AUREUS  FEW CITROBACTER KOSERI  MODERATE STAPHYLOCOCCUS LUGDUNENSIS   Assessment:   1. Osteomyelitis of great toe of right foot ([name])   2. Post-operative state   3. Diabetic polyneuropathy associated with type 2 diabetes mellitus ([name])       Plan:  Patient was evaluated and treated and all questions answered.  6 weeks s/p right great toe amputation proximal phalanx at level -Progressing as expected postoperatively amputation now fully healed looking great -XR: Expected postoperative changes -WB Status: Weightbearing as tolerated in regular shoe gear -Sutures: Previously  removed -Medications/ABX: No antibiotics indicated -Dressing: None required - F/u Plan: Follow-up as needed, plan to follow-up with Dr. Loreda for routine nail care        Marolyn JULIANNA Honour, DPM [facility] / [facility]

## 2024-04-28 ENCOUNTER — Ambulatory Visit (HOSPITAL_COMMUNITY): Admission: RE | Admit: 2024-04-28 | Discharge: 2024-04-28 | Disposition: A | Attending: Surgery | Admitting: Surgery

## 2024-04-28 ENCOUNTER — Ambulatory Visit

## 2024-04-28 ENCOUNTER — Ambulatory Visit (HOSPITAL_COMMUNITY): Admit: 2024-04-28 | Discharge: 2024-04-28 | Disposition: A | Attending: Surgery | Admitting: Surgery

## 2024-04-28 VITALS — BP 134/76 | HR 74 | Temp 97.7°F | Wt 139.5 lb

## 2024-04-28 DIAGNOSIS — I70221 Atherosclerosis of native arteries of extremities with rest pain, right leg: Secondary | ICD-10-CM

## 2024-04-28 DIAGNOSIS — L97519 Non-pressure chronic ulcer of other part of right foot with unspecified severity: Secondary | ICD-10-CM | POA: Diagnosis not present

## 2024-04-28 DIAGNOSIS — I739 Peripheral vascular disease, unspecified: Secondary | ICD-10-CM

## 2024-04-28 LAB — VAS US ABI WITH/WO TBI

## 2024-04-28 NOTE — Progress Notes (Signed)
 Office Note     CC:  follow up Requesting Provider:  Theophilus Andrews, Tully GRADE, MD  HPI: [name] [name] is a 73 y.o. (05/16/1951) male who presents status post angiogram with balloon angioplasty of the right anterior tibial artery by Dr. Pearline on 03/13/2024.  This was performed due to critical limb ischemia with right great toe wound.  The following day he underwent partial right great toe amputation by Dr. Malvin.  He was seen by Dr. Malvin yesterday and states his amputation has healed.  He denies any claudication, rest pain, or other tissue loss.  He denies any ongoing issues with his left groin access site.  He is on aspirin  and statin daily.  He is a former smoker.   Past Medical History:  Diagnosis Date   Anemia    as a younger man   Arthritis    hands   Cataract    Chronic kidney disease    starting to bother me now (07/28/2012)   Diabetes mellitus     type II   Diarrhea, functional    ED (erectile dysfunction)    Hypertension    Neuropathy    Peripheral neuropathy     Past Surgical History:  Procedure Laterality Date   ABDOMINAL AORTOGRAM W/LOWER EXTREMITY N/A 03/13/2024   Procedure: ABDOMINAL AORTOGRAM W/LOWER EXTREMITY;  Surgeon: Pearline Norman RAMAN, MD;  Location: [facility] INVASIVE CV LAB;  Service: Cardiovascular;  Laterality: N/A;   AMPUTATION TOE Right 03/14/2024   Procedure: AMPUTATION, TOE;  Surgeon: Malvin Marsa FALCON, DPM;  Location: [facility] OR;  Service: Orthopedics/Podiatry;  Laterality: Right;  RIGHT GREAT TOE   APPENDECTOMY  1950's   AV FISTULA PLACEMENT Right 05/14/2016   Procedure: ARTERIOVENOUS (AV) FISTULA CREATION- RIGHT ARM;  Surgeon: Gaile LELON New, MD;  Location: [facility] OR;  Service: Vascular;  Laterality: Right;   COLONOSCOPY     KIDNEY TRANSPLANT Right    LOWER EXTREMITY ANGIOGRAPHY Right 03/13/2024   Procedure: Lower Extremity Angiography;  Surgeon: Pearline Norman RAMAN, MD;  Location: [facility] INVASIVE CV LAB;  Service: Cardiovascular;  Laterality:  Right;   LOWER EXTREMITY INTERVENTION  03/13/2024   Procedure: LOWER EXTREMITY INTERVENTION;  Surgeon: Pearline Norman RAMAN, MD;  Location: [facility] INVASIVE CV LAB;  Service: Cardiovascular;;   PERIPHERAL INTRAVASCULAR LITHOTRIPSY  03/13/2024   Procedure: PERIPHERAL INTRAVASCULAR LITHOTRIPSY;  Surgeon: Pearline Norman RAMAN, MD;  Location: [facility] INVASIVE CV LAB;  Service: Cardiovascular;;   PROSTATE SURGERY     UPPER GASTROINTESTINAL ENDOSCOPY      Social History   Socioeconomic History   Marital status: Married    Spouse name: Not on file   Number of children: 3   Years of education: Not on file   Highest education level: Associate degree: academic program  Occupational History    Employer: [employer]    Comment: [employer]  Tobacco Use   Smoking status: Former    Current packs/day: 0.00    Average packs/day: 0.1 packs/day for 10.0 years (1.2 ttl pk-yrs)    Types: Cigarettes    Start date: 04/27/1976    Quit date: 04/27/1986    Years since quitting: 38.0   Smokeless tobacco: Never   Tobacco comments:    07/28/2012 quit smoking cigarettes 20-30 yr ago  Vaping Use   Vaping status: Never Used  Substance and Sexual Activity   Alcohol use: Not Currently    Alcohol/week: 1.0 standard drink of alcohol    Types: 1 Cans of beer per week  Comment: occ   Drug use: No   Sexual activity: Not Currently    Partners: Male  Other Topics Concern   Not on file  Social History Narrative   Not on file   Social Drivers of Health   Tobacco Use: Medium Risk (04/28/2024)   Patient History    Smoking Tobacco Use: Former    Smokeless Tobacco Use: Never    Passive Exposure: Not on file  Financial Resource Strain: Low Risk  (11/23/2023)   Received from [facility]   Overall Financial Resource Strain (CARDIA)    Difficulty of Paying Living Expenses: Not hard at all  Food Insecurity: No Food Insecurity (03/12/2024)   Epic    Worried About Radiation Protection Practitioner of Food in the  Last Year: Never true    Ran Out of Food in the Last Year: Never true  Transportation Needs: No Transportation Needs (03/12/2024)   Epic    Lack of Transportation (Medical): No    Lack of Transportation (Non-Medical): No  Physical Activity: Sufficiently Active (08/05/2022)   Exercise Vital Sign    Days of Exercise per Week: 3 days    Minutes of Exercise per Session: 50 min  Stress: No Stress Concern Present (08/05/2022)   [employer] of Occupational Health - Occupational Stress Questionnaire    Feeling of Stress : Not at all  Social Connections: Socially Integrated (03/12/2024)   Social Connection and Isolation Panel    Frequency of Communication with Friends and Family: More than three times a week    Frequency of Social Gatherings with Friends and Family: More than three times a week    Attends Religious Services: More than 4 times per year    Active Member of [employer] or Organizations: Yes    Attends Banker Meetings: More than 4 times per year    Marital Status: Married  Catering Manager Violence: Not At Risk (03/12/2024)   Epic    Fear of Current or Ex-Partner: No    Emotionally Abused: No    Physically Abused: No    Sexually Abused: No  Depression (PHQ2-9): Low Risk (01/25/2024)   Depression (PHQ2-9)    PHQ-2 Score: 0  Alcohol Screen: Low Risk (08/05/2022)   Alcohol Screen    Last Alcohol Screening Score (AUDIT): 1  Housing: Low Risk (03/12/2024)   Epic    Unable to Pay for Housing in the Last Year: No    Number of Times Moved in the Last Year: 0    Homeless in the Last Year: No  Utilities: Not At Risk (03/12/2024)   Epic    Threatened with loss of utilities: No  Health Literacy: Not on file    Family History  Problem Relation Age of Onset   Colon polyps Mother    Stroke Mother    Diabetes Father    Diabetes Cousin    Prostate cancer Brother    Other Daughter        kidney problems   Esophageal cancer Maternal Aunt    Colon cancer Neg Hx     Stomach cancer Neg Hx    Rectal cancer Neg Hx     Current Outpatient Medications  Medication Sig Dispense Refill   amLODipine  (NORVASC ) 2.5 MG tablet Take 2.5 mg by mouth daily.     aspirin  EC 81 MG tablet Take 81 mg by mouth daily.     atorvastatin  (LIPITOR) 40 MG tablet Take 1 tablet (40 mg total) by mouth daily. 30 tablet  0   BAQSIMI ONE PACK 3 MG/DOSE POWD Place 1 spray into the nose daily as needed for low blood sugar.     calcitRIOL  (ROCALTROL ) 0.25 MCG capsule Take 0.25 mcg by mouth every Monday, Wednesday, and Friday.     carvedilol  (COREG ) 12.5 MG tablet Take 12.5 mg by mouth 2 (two) times daily with a meal.     famotidine  (PEPCID ) 20 MG tablet Take 20 mg by mouth daily.     folic acid  (FOLVITE ) 1 MG tablet Take 1 mg by mouth daily.     furosemide  (LASIX ) 40 MG tablet Take 40 mg by mouth daily.     insulin  glargine (LANTUS  SOLOSTAR) 100 UNIT/ML Solostar Pen Inject 6 Units into the skin at bedtime.     insulin  lispro (HUMALOG KWIKPEN) 100 UNIT/ML KwikPen Inject 0-15 Units into the skin 3 (three) times daily as needed (high blood sugar).     Melatonin 10 MG TABS Take 10 mg by mouth at bedtime.     Semaglutide,0.25 or 0.5MG /DOS, 2 MG/3ML SOPN Inject 0.25 mg into the skin once a week. Sundays     sevelamer  carbonate (RENVELA ) 800 MG tablet      tacrolimus  (PROGRAF ) 1 MG capsule Take 3 mg by mouth 2 (two) times daily.     thiamine  100 MG tablet Take 100 mg by mouth daily.     predniSONE  (DELTASONE ) 5 MG tablet Take 5 mg by mouth daily with breakfast. (Patient not taking: Reported on 04/27/2024)     No current facility-administered medications for this visit.    Allergies[1]   REVIEW OF SYSTEMS:  Negative unless noted in HPI [X]  denotes positive finding, [ ]  denotes negative finding Cardiac  Comments:  Chest pain or chest pressure:    Shortness of breath upon exertion:    Short of breath when lying flat:    Irregular heart rhythm:        Vascular    Pain in calf, thigh, or  hip brought on by ambulation:    Pain in feet at night that wakes you up from your sleep:     Blood clot in your veins:    Leg swelling:         Pulmonary    Oxygen at home:    Productive cough:     Wheezing:         Neurologic    Sudden weakness in arms or legs:     Sudden numbness in arms or legs:     Sudden onset of difficulty speaking or slurred speech:    Temporary loss of vision in one eye:     Problems with dizziness:         Gastrointestinal    Blood in stool:     Vomited blood:         Genitourinary    Burning when urinating:     Blood in urine:        Psychiatric    Major depression:         Hematologic    Bleeding problems:    Problems with blood clotting too easily:        Skin    Rashes or ulcers:        Constitutional    Fever or chills:      PHYSICAL EXAMINATION:  Vitals:   04/28/24 1445  BP: 134/76  Pulse: 74  Temp: 97.7 F (36.5 C)  TempSrc: Temporal  Weight: 139 lb 8 oz (63.3 kg)  General:  WDWN in NAD; vital signs documented above Gait: Not observed HENT: WNL, normocephalic Pulmonary: normal non-labored breathing Cardiac: regular HR Abdomen: soft, NT, no masses Skin: without rashes Vascular Exam/Pulses: 1+ R DP Extremities: without ischemic changes, without Gangrene , without cellulitis; without open wounds; left groin without hematoma Musculoskeletal: no muscle wasting or atrophy  Neurologic: A&O X 3 Psychiatric:  The pt has Normal affect.   Non-Invasive Vascular Imaging:   Right lower extremity arterial duplex shows an elevated velocity in the proximal right ATA which was previously occluded  He has incompressible tibial vessels    ASSESSMENT/PLAN:: 73 y.o. male status post angiography with balloon angioplasty of the right anterior tibial artery due to critical limb ischemia with great toe tissue loss  [name] [name] is doing well since his procedure.  He has since healed his partial right great toe amputation.  His left  groin cath site is without hematoma.  Duplex shows an elevated velocity in the anterior tibial artery however this was previously occluded and he has since healed his toe amputation.  No indication for repeating angiography at this time.  He will continue his aspirin  and statin daily.  We will recheck his right lower extremity arterial duplex and ABI in 6 months.  He will notify the office with any questions or concerns in the meantime.   Donnice Sender, PA-C [facility] [phone]  Clinic MD:   Pearline     [1]  Allergies Allergen Reactions   Other Rash    Oxyquinoline-white Pet-lanolin   Aquamed Rash   Bag Balm  [Albolene] Rash   Cold Cream Rash

## 2024-05-01 ENCOUNTER — Other Ambulatory Visit: Payer: Self-pay

## 2024-05-01 DIAGNOSIS — I739 Peripheral vascular disease, unspecified: Secondary | ICD-10-CM

## 2024-05-02 ENCOUNTER — Telehealth: Payer: Self-pay | Admitting: *Deleted

## 2024-05-02 NOTE — Telephone Encounter (Signed)
 Spoke to [place] and asked her to refax the form.

## 2024-05-02 NOTE — Telephone Encounter (Signed)
 Copied from CRM #[id number]. Topic: General - Other >> May 02, 2024  1:30 PM Deaijah H wrote: Reason for CRM: Doyal w/ [facility] called in to follow up on order skilled nursing and OT that were refaxed 04/28/24 per CAL Elly) nurse will check. Please call [phone] to confirm.

## 2024-05-04 ENCOUNTER — Ambulatory Visit: Admitting: Podiatry

## 2024-05-04 VITALS — Ht 70.0 in | Wt 139.5 lb

## 2024-05-04 DIAGNOSIS — E1142 Type 2 diabetes mellitus with diabetic polyneuropathy: Secondary | ICD-10-CM

## 2024-05-04 DIAGNOSIS — M79674 Pain in right toe(s): Secondary | ICD-10-CM | POA: Diagnosis not present

## 2024-05-04 DIAGNOSIS — B351 Tinea unguium: Secondary | ICD-10-CM | POA: Diagnosis not present

## 2024-05-04 DIAGNOSIS — I999 Unspecified disorder of circulatory system: Secondary | ICD-10-CM

## 2024-05-04 DIAGNOSIS — M79675 Pain in left toe(s): Secondary | ICD-10-CM | POA: Diagnosis not present

## 2024-05-04 DIAGNOSIS — N179 Acute kidney failure, unspecified: Secondary | ICD-10-CM

## 2024-05-04 NOTE — Progress Notes (Signed)
 This patient returns to my office for at risk foot care.  This patient requires this care by a professional since this patient will be at risk due to having diabetes  pvd and ESRD This patient is unable to cut nails himself since the patient cannot reach his nails.These nails are painful walking and wearing shoes.  This patient presents for at risk foot care today.  General Appearance  Alert, conversant and in no acute stress.  Vascular  Dorsalis pedis and posterior tibial  pulses are weakly  palpable  bilaterally.  Capillary return is within normal limits  bilaterally. Temperature is within normal limits  bilaterally.  Neurologic  Senn-Weinstein monofilament wire test within normal limits  bilaterally. Muscle power within normal limits bilaterally.  Nails Thick disfigured discolored nails with subungual debris  from hallux to fifth toes left and 2-5 right foot. No evidence of bacterial infection or drainage bilaterally.  Orthopedic  No limitations of motion  feet .  No crepitus or effusions noted.  No bony pathology or digital deformities noted.  Absent distal phalanx right hallux.  Skin  normotropic skin with no porokeratosis noted bilaterally.  No signs of infections or ulcers noted.     Onychomycosis  Pain in right toes  Pain in left toes  Consent was obtained for treatment procedures.   Mechanical debridement of nails 1-5  bilaterally performed with a nail nipper.  Filed with dremel without incident.    Return office visit   3 months                   Told patient to return for periodic foot care and evaluation due to potential at risk complications.   Cordella Bold DPM

## 2024-06-21 ENCOUNTER — Ambulatory Visit: Payer: Self-pay

## 2024-06-21 NOTE — Telephone Encounter (Signed)
 Message from Lucas G sent at 06/21/2024  2:02 PM EST  Reason for Triage: Tyleen (patient daughter) is calling regarding her father stated that he's having bad stomach pains and feeling weak/laying around for a couple days now

## 2024-06-21 NOTE — Telephone Encounter (Signed)
 Daughter reports pt's wife called her and states patient is having abd pain and weakness. Daughter reports patient is not with her, patient is home.  Reports on her way to patient's house.  Nurse informed will attempt to contact patient and to call back when she arrives to home. Pt's daughter verbalized understanding.

## 2024-06-21 NOTE — Telephone Encounter (Signed)
 Pt reporting nausea, fatigue, stomach ache, sensitivity to sound, and stiff neck. Pt noted to be a poor historian. Was difficult to complete NT. ED advised, pt and family members agreeable.    FYI Only or Action Required?: FYI only for provider: ED advised.  Patient was last seen in primary care on 03/22/2024 by Theophilus Andrews, Tully GRADE, MD.  Called Nurse Triage reporting Abdominal Pain and Fatigue.  Symptoms began today.  Interventions attempted: Nothing.  Symptoms are: stable.  Triage Disposition: Go to ED Now (or PCP Triage)  Patient/caregiver understands and will follow disposition?: Yes   Reason for Triage:  patient daughter, Tyleen called earlier and spoke to NT  regarding her father stated that he's having bad stomach pains and feeling weak/laying around for a couple days now, and she is with the patient now and would like to speak back to NT.   Reason for Disposition  Patient sounds very sick or weak to the triager  Answer Assessment - Initial Assessment Questions 1. LOCATION: Where does it hurt?      Like a stomachache, per pt.    2. RADIATION: Does the pain shoot anywhere else? (e.g., chest, back)     Denies  3. ONSET: When did the pain begin? (Minutes, hours or days ago)    An hour or so  9. RELIEVING/AGGRAVATING FACTORS: What makes it better or worse? (e.g., antacids, bending or twisting motion, bowel movement)     Walking makes him fatigued  10. OTHER SYMPTOMS: Do you have any other symptoms? (e.g., back pain, diarrhea, fever, urination pain, vomiting)       Sensitive to noise (he's holding his temples), nauseous, poor appetite per wife. Pt reports weakness and fatigue. Pt states he also has some neck pain.  Protocols used: Abdominal Pain - Male-A-AH

## 2024-06-21 NOTE — Telephone Encounter (Signed)
 This RN attempted to contact patient, no answer, left voicemail message.   Called patient's wife, no answer, unable to leave vm message.  Will place in Call Back folder.

## 2024-06-22 ENCOUNTER — Inpatient Hospital Stay (HOSPITAL_BASED_OUTPATIENT_CLINIC_OR_DEPARTMENT_OTHER)
Admission: EM | Admit: 2024-06-22 | Source: Ambulatory Visit | Attending: Internal Medicine | Admitting: Internal Medicine

## 2024-06-22 ENCOUNTER — Encounter (HOSPITAL_BASED_OUTPATIENT_CLINIC_OR_DEPARTMENT_OTHER): Payer: Self-pay | Admitting: Emergency Medicine

## 2024-06-22 ENCOUNTER — Inpatient Hospital Stay (HOSPITAL_COMMUNITY)

## 2024-06-22 ENCOUNTER — Emergency Department (HOSPITAL_BASED_OUTPATIENT_CLINIC_OR_DEPARTMENT_OTHER)

## 2024-06-22 DIAGNOSIS — Z794 Long term (current) use of insulin: Secondary | ICD-10-CM | POA: Diagnosis not present

## 2024-06-22 DIAGNOSIS — E11649 Type 2 diabetes mellitus with hypoglycemia without coma: Secondary | ICD-10-CM | POA: Diagnosis not present

## 2024-06-22 DIAGNOSIS — Z8673 Personal history of transient ischemic attack (TIA), and cerebral infarction without residual deficits: Secondary | ICD-10-CM | POA: Diagnosis not present

## 2024-06-22 DIAGNOSIS — R531 Weakness: Secondary | ICD-10-CM | POA: Diagnosis not present

## 2024-06-22 DIAGNOSIS — N390 Urinary tract infection, site not specified: Principal | ICD-10-CM

## 2024-06-22 DIAGNOSIS — N184 Chronic kidney disease, stage 4 (severe): Secondary | ICD-10-CM | POA: Diagnosis not present

## 2024-06-22 DIAGNOSIS — R109 Unspecified abdominal pain: Secondary | ICD-10-CM | POA: Diagnosis present

## 2024-06-22 DIAGNOSIS — R7989 Other specified abnormal findings of blood chemistry: Secondary | ICD-10-CM | POA: Diagnosis not present

## 2024-06-22 DIAGNOSIS — N179 Acute kidney failure, unspecified: Secondary | ICD-10-CM | POA: Diagnosis present

## 2024-06-22 DIAGNOSIS — Z940 Kidney transplant status: Secondary | ICD-10-CM | POA: Diagnosis not present

## 2024-06-22 DIAGNOSIS — I10 Essential (primary) hypertension: Secondary | ICD-10-CM | POA: Diagnosis present

## 2024-06-22 LAB — TROPONIN T, HIGH SENSITIVITY
Troponin T High Sensitivity: 196 ng/L (ref 0–19)
Troponin T High Sensitivity: 186 ng/L (ref 0–19)

## 2024-06-22 LAB — URINE DRUG SCREEN
Amphetamines: NEGATIVE
Barbiturates: NEGATIVE
Benzodiazepines: NEGATIVE
Cocaine: NEGATIVE
Fentanyl: NEGATIVE
Methadone Scn, Ur: NEGATIVE
Opiates: NEGATIVE
Tetrahydrocannabinol: NEGATIVE

## 2024-06-22 LAB — CBC WITH DIFFERENTIAL/PLATELET
Abs Immature Granulocytes: 0.02 10*3/uL (ref 0.00–0.07)
Basophils Absolute: 0 10*3/uL (ref 0.0–0.1)
Basophils Relative: 0 %
Eosinophils Absolute: 0.1 10*3/uL (ref 0.0–0.5)
Eosinophils Relative: 2 %
HCT: 35.7 % — ABNORMAL LOW (ref 39.0–52.0)
Hemoglobin: 11.5 g/dL — ABNORMAL LOW (ref 13.0–17.0)
Immature Granulocytes: 0 %
Lymphocytes Relative: 13 %
Lymphs Abs: 1 10*3/uL (ref 0.7–4.0)
MCH: 27.7 pg (ref 26.0–34.0)
MCHC: 32.2 g/dL (ref 30.0–36.0)
MCV: 86 fL (ref 80.0–100.0)
Monocytes Absolute: 0.5 10*3/uL (ref 0.1–1.0)
Monocytes Relative: 6 %
Neutro Abs: 5.8 10*3/uL (ref 1.7–7.7)
Neutrophils Relative %: 79 %
Platelets: 231 10*3/uL (ref 150–400)
RBC: 4.15 MIL/uL — ABNORMAL LOW (ref 4.22–5.81)
RDW: 13.2 % (ref 11.5–15.5)
WBC: 7.4 10*3/uL (ref 4.0–10.5)
nRBC: 0 % (ref 0.0–0.2)

## 2024-06-22 LAB — COMPREHENSIVE METABOLIC PANEL WITH GFR
ALT: 9 U/L (ref 0–44)
AST: 14 U/L — ABNORMAL LOW (ref 15–41)
Albumin: 4.2 g/dL (ref 3.5–5.0)
Alkaline Phosphatase: 59 U/L (ref 38–126)
Anion gap: 13 (ref 5–15)
BUN: 65 mg/dL — ABNORMAL HIGH (ref 8–23)
CO2: 21 mmol/L — ABNORMAL LOW (ref 22–32)
Calcium: 10.3 mg/dL (ref 8.9–10.3)
Chloride: 106 mmol/L (ref 98–111)
Creatinine, Ser: 4.17 mg/dL — ABNORMAL HIGH (ref 0.61–1.24)
GFR, Estimated: 14 mL/min — ABNORMAL LOW
Glucose, Bld: 54 mg/dL — ABNORMAL LOW (ref 70–99)
Potassium: 4.6 mmol/L (ref 3.5–5.1)
Sodium: 140 mmol/L (ref 135–145)
Total Bilirubin: 0.3 mg/dL (ref 0.0–1.2)
Total Protein: 7.7 g/dL (ref 6.5–8.1)

## 2024-06-22 LAB — CBG MONITORING, ED
Glucose-Capillary: 52 mg/dL — ABNORMAL LOW (ref 70–99)
Glucose-Capillary: 106 mg/dL — ABNORMAL HIGH (ref 70–99)
Glucose-Capillary: 53 mg/dL — ABNORMAL LOW (ref 70–99)

## 2024-06-22 LAB — URINALYSIS, ROUTINE W REFLEX MICROSCOPIC
Bilirubin Urine: NEGATIVE
Glucose, UA: NEGATIVE mg/dL
Hgb urine dipstick: NEGATIVE
Ketones, ur: NEGATIVE mg/dL
Nitrite: NEGATIVE
Protein, ur: NEGATIVE mg/dL
Specific Gravity, Urine: 1.009 (ref 1.005–1.030)
pH: 5 (ref 5.0–8.0)

## 2024-06-22 LAB — RESP PANEL BY RT-PCR (RSV, FLU A&B, COVID)  RVPGX2
Influenza A by PCR: NEGATIVE
Influenza B by PCR: NEGATIVE
Resp Syncytial Virus by PCR: NEGATIVE
SARS Coronavirus 2 by RT PCR: NEGATIVE

## 2024-06-22 LAB — GLUCOSE, CAPILLARY: Glucose-Capillary: 412 mg/dL — ABNORMAL HIGH (ref 70–99)

## 2024-06-22 LAB — LACTIC ACID, PLASMA: Lactic Acid, Venous: 0.6 mmol/L (ref 0.5–1.9)

## 2024-06-22 LAB — MAGNESIUM: Magnesium: 2.3 mg/dL (ref 1.7–2.4)

## 2024-06-22 LAB — TSH: TSH: 0.878 u[IU]/mL (ref 0.350–4.500)

## 2024-06-22 LAB — VITAMIN B12: Vitamin B-12: 989 pg/mL — ABNORMAL HIGH (ref 180–914)

## 2024-06-22 LAB — AMMONIA: Ammonia: <13 umol/L (ref 9–35)

## 2024-06-22 LAB — CK: Total CK: 67 U/L (ref 49–397)

## 2024-06-22 MED ORDER — SENNA 8.6 MG PO TABS: 1.0000 | ORAL_TABLET | Freq: Every day | ORAL | Status: AC | PRN

## 2024-06-22 MED ORDER — FOLIC ACID 1 MG PO TABS
1.0000 mg | ORAL_TABLET | Freq: Every day | ORAL | Status: AC
  Administered 2024-06-23: 1 mg via ORAL
  Filled 2024-06-22: qty 1

## 2024-06-22 MED ORDER — INSULIN ASPART 100 UNIT/ML IJ SOLN
0.0000 [IU] | Freq: Every day | INTRAMUSCULAR | Status: AC
  Administered 2024-06-22: 5 [IU] via SUBCUTANEOUS
  Filled 2024-06-22: qty 5

## 2024-06-22 MED ORDER — ASPIRIN 81 MG PO TBEC
81.0000 mg | DELAYED_RELEASE_TABLET | Freq: Every day | ORAL | Status: AC
  Administered 2024-06-23: 81 mg via ORAL
  Filled 2024-06-22: qty 1

## 2024-06-22 MED ORDER — CARVEDILOL 12.5 MG PO TABS
12.5000 mg | ORAL_TABLET | Freq: Two times a day (BID) | ORAL | Status: DC
  Administered 2024-06-23: 12.5 mg via ORAL
  Filled 2024-06-22: qty 1

## 2024-06-22 MED ORDER — ACETAMINOPHEN 325 MG PO TABS: 650.0000 mg | ORAL_TABLET | Freq: Four times a day (QID) | ORAL | Status: AC | PRN

## 2024-06-22 MED ORDER — THIAMINE MONONITRATE 100 MG PO TABS
100.0000 mg | ORAL_TABLET | Freq: Every day | ORAL | Status: AC
  Administered 2024-06-23: 100 mg via ORAL
  Filled 2024-06-22: qty 1

## 2024-06-22 MED ORDER — FENTANYL CITRATE (PF) 50 MCG/ML IJ SOSY: 12.5000 ug | PREFILLED_SYRINGE | INTRAMUSCULAR | Status: AC | PRN

## 2024-06-22 MED ORDER — ACETAMINOPHEN 650 MG RE SUPP: 650.0000 mg | Freq: Four times a day (QID) | RECTAL | Status: AC | PRN

## 2024-06-22 MED ORDER — OXYCODONE HCL 5 MG PO TABS: 5.0000 mg | ORAL_TABLET | ORAL | Status: AC | PRN

## 2024-06-22 MED ORDER — HEPARIN SODIUM (PORCINE) 5000 UNIT/ML IJ SOLN
5000.0000 [IU] | Freq: Three times a day (TID) | INTRAMUSCULAR | Status: AC
  Administered 2024-06-22 – 2024-06-23 (×4): 5000 [IU] via SUBCUTANEOUS
  Filled 2024-06-22 (×4): qty 1

## 2024-06-22 MED ORDER — SODIUM CHLORIDE 0.9 % IV SOLN: INTRAVENOUS | Status: AC

## 2024-06-22 MED ORDER — ATORVASTATIN CALCIUM 40 MG PO TABS
40.0000 mg | ORAL_TABLET | Freq: Every day | ORAL | Status: AC
  Administered 2024-06-23: 40 mg via ORAL
  Filled 2024-06-22: qty 1

## 2024-06-22 MED ORDER — AMLODIPINE BESYLATE 5 MG PO TABS
2.5000 mg | ORAL_TABLET | Freq: Every day | ORAL | Status: DC
  Administered 2024-06-23: 2.5 mg via ORAL
  Filled 2024-06-22: qty 1

## 2024-06-22 MED ORDER — INSULIN ASPART 100 UNIT/ML IJ SOLN
0.0000 [IU] | Freq: Three times a day (TID) | INTRAMUSCULAR | Status: AC
  Administered 2024-06-23: 2 [IU] via SUBCUTANEOUS
  Administered 2024-06-23: 1 [IU] via SUBCUTANEOUS
  Filled 2024-06-22: qty 2
  Filled 2024-06-22: qty 1

## 2024-06-22 MED ORDER — SODIUM CHLORIDE 0.9 % IV SOLN
1.0000 g | Freq: Once | INTRAVENOUS | Status: AC
  Administered 2024-06-22: 1 g via INTRAVENOUS
  Filled 2024-06-22: qty 10

## 2024-06-22 MED ORDER — PROCHLORPERAZINE EDISYLATE 10 MG/2ML IJ SOLN: 5.0000 mg | Freq: Four times a day (QID) | INTRAMUSCULAR | Status: AC | PRN

## 2024-06-22 MED ORDER — SODIUM CHLORIDE 0.9% FLUSH
3.0000 mL | Freq: Two times a day (BID) | INTRAVENOUS | Status: AC
  Administered 2024-06-22 – 2024-06-23 (×2): 3 mL via INTRAVENOUS

## 2024-06-22 MED ORDER — MELATONIN 5 MG PO TABS
10.0000 mg | ORAL_TABLET | Freq: Every evening | ORAL | Status: AC | PRN
  Administered 2024-06-22 – 2024-06-23 (×2): 10 mg via ORAL
  Filled 2024-06-22 (×2): qty 2

## 2024-06-22 MED ORDER — SODIUM CHLORIDE 0.9 % IV BOLUS
500.0000 mL | Freq: Once | INTRAVENOUS | Status: AC
  Administered 2024-06-22: 500 mL via INTRAVENOUS

## 2024-06-22 MED ORDER — TACROLIMUS 1 MG PO CAPS
3.0000 mg | ORAL_CAPSULE | Freq: Two times a day (BID) | ORAL | Status: AC
  Administered 2024-06-22 – 2024-06-23 (×3): 3 mg via ORAL
  Filled 2024-06-22 (×3): qty 3

## 2024-06-22 MED ORDER — PREDNISONE 5 MG PO TABS
5.0000 mg | ORAL_TABLET | Freq: Every day | ORAL | Status: AC
  Administered 2024-06-23: 5 mg via ORAL
  Filled 2024-06-22: qty 1

## 2024-06-22 NOTE — ED Notes (Signed)
 Gave patient apple juice and peanut butter crackers Per RN, [employer].

## 2024-06-22 NOTE — Evaluation (Addendum)
 RT Evaluate and Treat Note  06/22/2024   Breathing is (select one): Same as normal    The following was found on auscultation (select multiple):  Bilateral Breath Sounds: Clear;Diminished (06/22/24 2059)  R Upper  Breath Sounds: Diminished (06/22/24 2059) L Upper Breath Sounds: Diminished (06/22/24 2059) R Lower Breath Sounds: Diminished (06/22/24 2059) L Lower Breath Sounds: Diminished (06/22/24 2059)    Cough Assessment: Cough: None (06/22/24 2059)    Most Recent Chest Xray:... (DG Chest Portable 1 View Result Date: 06/22/2024 CLINICAL DATA:  Weakness. EXAM: PORTABLE CHEST 1 VIEW COMPARISON:  January 18, 2024. FINDINGS: The heart size and mediastinal contours are within normal limits. Both lungs are clear. The visualized skeletal structures are unremarkable. IMPRESSION: No active disease. Electronically Signed   By: Lynwood Landy Raddle M.D.   On: 06/22/2024 13:45      The following medications and/or interventions were ordered/changed/discontinued as part of the Respiratory Treatment protocol:   Medication Changes: No Change   Airway Clearance Changes: No Change   Oxygen Therapy Changes:No Change  Patient does not have a respiratory history and will be seen as needed.

## 2024-06-22 NOTE — ED Notes (Signed)
 CBG re-checked at this time, CBG 53. Pt given additional juice. EDP notified.

## 2024-06-22 NOTE — ED Notes (Signed)
 Called lab they are going to add on the urine culture.

## 2024-06-22 NOTE — ED Provider Notes (Cosign Needed)
 " [name] [name] EMERGENCY DEPARTMENT AT [facility] Provider Note   CSN: [id number] Arrival date & time: 06/22/24  1038     [name] [name] presents with: Abdominal Pain   [name] [name] is a 74 y.o. male.   74 year old male presents with his daughter for concern of just not feeling well for the past month including weakness, abdominal pain, nausea, headache, dizziness.  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  Currently without any abdominal pain.  He states when he gets his abdominal pain if he lays down it typically resolves.  Does not have any fever.  He has chronic lingering cough.  Does endorse some urinary frequency but no dysuria or hematuria.  The history is provided by the [name] [name]. No language interpreter was used.       Prior to Admission medications  Medication Sig Start Date End Date Taking? Authorizing Provider  amLODipine  (NORVASC ) 2.5 MG tablet Take 2.5 mg by mouth daily. 08/16/21   [provider]  aspirin  EC 81 MG tablet Take 81 mg by mouth daily. 02/06/24 02/05/25  [provider]  atorvastatin  (LIPITOR) 40 MG tablet Take 1 tablet (40 mg total) by mouth daily. 01/24/24   Elpidio Reyes DEL, MD  BAQSIMI ONE PACK 3 MG/DOSE POWD Place 1 spray into the nose daily as needed for low blood sugar. 05/13/21   [provider]  calcitRIOL  (ROCALTROL ) 0.25 MCG capsule Take 0.25 mcg by mouth every Monday, Wednesday, and Friday. 11/22/23 11/21/24  [provider]  carvedilol  (COREG ) 12.5 MG tablet Take 12.5 mg by mouth 2 (two) times daily with a meal.    [provider]  famotidine  (PEPCID ) 20 MG tablet Take 20 mg by mouth daily. 09/12/20   [provider]  folic acid  (FOLVITE ) 1 MG tablet Take 1 mg by mouth daily. 09/12/20   [provider]  furosemide  (LASIX ) 40 MG tablet  Take 40 mg by mouth daily. 07/19/21   [provider]  insulin  glargine (LANTUS  SOLOSTAR) 100 UNIT/ML Solostar Pen Inject 6 Units into the skin at bedtime.    [provider]  insulin  lispro (HUMALOG KWIKPEN) 100 UNIT/ML KwikPen Inject 0-15 Units into the skin 3 (three) times daily as needed (high blood sugar).    [provider]  Melatonin 10 MG TABS Take 10 mg by mouth at bedtime.    [provider]  predniSONE  (DELTASONE ) 5 MG tablet Take 5 mg by mouth daily with breakfast.    [provider]  Semaglutide,0.25 or 0.5MG /DOS, 2 MG/3ML SOPN Inject 0.25 mg into the skin once a week. Sundays 08/16/23   [provider]  sevelamer  carbonate (RENVELA ) 800 MG tablet  10/11/17   [provider]  tacrolimus  (PROGRAF ) 1 MG capsule Take 3 mg by mouth 2 (two) times daily.    [provider]  thiamine  100 MG tablet Take 100 mg by mouth daily. 09/11/20   [provider]    Allergies: Other, Aquamed, Bag balm  [albolene], and Cold cream    Review of Systems  Constitutional:  Negative for chills and fever.  Respiratory:  Negative for shortness of breath.   Cardiovascular:  Negative for chest pain.  All other systems reviewed and are negative.   Updated Vital Signs BP (!) 164/64   Pulse (!) 57   Temp (!) 97.5 F (  36.4 C) (Oral)   Resp 12   SpO2 100%   Physical Exam  (all labs ordered are listed, but only abnormal results are displayed) Labs Reviewed  CBC WITH DIFFERENTIAL/PLATELET - Abnormal; Notable for the following components:      Result Value   RBC 4.15 (*)    Hemoglobin 11.5 (*)    HCT 35.7 (*)    All other components within normal limits  COMPREHENSIVE METABOLIC PANEL WITH GFR - Abnormal; Notable for the following components:   CO2 21 (*)    Glucose, Bld 54 (*)    BUN 65 (*)    Creatinine, Ser 4.17 (*)    AST 14 (*)    GFR, Estimated 14 (*)    All other components within normal limits  CBG MONITORING, ED  - Abnormal; Notable for the following components:   Glucose-Capillary 52 (*)    All other components within normal limits  CBG MONITORING, ED - Abnormal; Notable for the following components:   Glucose-Capillary 53 (*)    All other components within normal limits  TROPONIN T, HIGH SENSITIVITY - Abnormal; Notable for the following components:   Troponin T High Sensitivity 196 (*)    All other components within normal limits  RESP PANEL BY RT-PCR (RSV, FLU A&B, COVID)  RVPGX2  MAGNESIUM   AMMONIA  URINALYSIS, ROUTINE W REFLEX MICROSCOPIC  TROPONIN T, HIGH SENSITIVITY    EKG: None  Radiology: CT Head Wo Contrast Result Date: 06/22/2024 EXAM: CT HEAD WITHOUT CONTRAST 06/22/2024 01:30:49 PM TECHNIQUE: CT of the head was performed without the administration of intravenous contrast. Automated exposure control, iterative reconstruction, and/or weight based adjustment of the mA/kV was utilized to reduce the radiation dose to as low as reasonably achievable. COMPARISON: CT head 01/18/2024. CLINICAL HISTORY: Headache, new onset (Age >= 51y) FINDINGS: BRAIN AND VENTRICLES: No acute hemorrhage. No evidence of acute infarct. No hydrocephalus. No extra-axial collection. No mass effect or midline shift. Patchy white matter hypodensities, compatible with chronic microvascular ischemic change. ORBITS: No acute abnormality. SINUSES: No acute abnormality. SOFT TISSUES AND SKULL: No acute soft tissue abnormality. No skull fracture. IMPRESSION: 1. No acute intracranial abnormality. Electronically signed by: Glendia Molt MD 06/22/2024 01:46 PM EST RP Workstation: [id number]   DG Chest Portable 1 View Result Date: 06/22/2024 CLINICAL DATA:  Weakness. EXAM: PORTABLE CHEST 1 VIEW COMPARISON:  January 18, 2024. FINDINGS: The heart size and mediastinal contours are within normal limits. Both lungs are clear. The visualized skeletal structures are unremarkable. IMPRESSION: No active disease. Electronically Signed   By:  Lynwood Landy Raddle M.D.   On: 06/22/2024 13:45     Procedures   Medications Ordered in the ED - No data to display  Clinical Course as of 06/22/24 1628  Thu Jun 22, 2024  1300 CBC shows no leukocytosis.  No significant anemia.  CMP without acute concerns but does show that creatinine is mildly increased to 4.17 from a baseline of about 3-1/2.  His troponin was elevated to 190s but repeat is 186.  low suspicion for ACS.  Respiratory panel negative.  CT head without acute intracranial finding.  Chest x-ray without acute cardiopulmonary process.  EKG without acute ischemic change. [AA]  [id number] Spoke with hospitalist who would like for me to speak with nephrology before he can pursue admission.  Spoke with nephrologist.  They feel comfortable with [name] [name] being admitted to [facility].  They state if hospitalist need them to consult they can formally consult them tomorrow as consults  can get lost in the shuffle while [name] [name] is being transported from med center to the admitting hospital. [AA]  1621 Spoke to hospitalist after discussing the case with nephrology.  They will accept [name] [name] for admission [AA]    Clinical Course User Index [AA] Hildegard Loge, PA-C                                 Medical Decision Making Amount and/or Complexity of Data Reviewed Labs: ordered. Radiology: ordered.  Risk Decision regarding hospitalization.   Medical Decision Making / ED Course   This [name] [name] presents to the ED for concern of weakness fatigue, malaise, this involves an extensive number of treatment options, and is a complaint that carries with it a high risk of complications and morbidity.  The differential diagnosis includes UTI, pyelonephritis, pneumonia, viral URI  MDM: 74 year old male presents with his daughter for above-mentioned complaints.  Currently he is alert and oriented.  He is currently at his baseline.  Hemodynamically he is stable.  He does have history of kidney transplant but he is not  compliant with his immunosuppressant medications.  They are concerned about meningitis however comparing his current presentation to his presentation in February 2022 when he had the cryptococcal meningitis he is not encephalopathic, his meningeal signs are negative, and today his workup shows evidence of UTI.  No suspicion for meningitis today.  Will start him on Rocephin .  Half liter fluid bolus given. Discussed with attending who also evaluated [name] [name].  Hospitalist will evaluate him for admission.   Additional history obtained: -Additional history obtained from daughter at bedside, chart reviewed -External records from outside source obtained and reviewed including: Chart review including previous notes, labs, imaging, consultation notes   Lab Tests: -I ordered, reviewed, and interpreted labs.   The pertinent results include:   Labs Reviewed  CBC WITH DIFFERENTIAL/PLATELET - Abnormal; Notable for the following components:      Result Value   RBC 4.15 (*)    Hemoglobin 11.5 (*)    HCT 35.7 (*)    All other components within normal limits  COMPREHENSIVE METABOLIC PANEL WITH GFR - Abnormal; Notable for the following components:   CO2 21 (*)    Glucose, Bld 54 (*)    BUN 65 (*)    Creatinine, Ser 4.17 (*)    AST 14 (*)    GFR, Estimated 14 (*)    All other components within normal limits  URINALYSIS, ROUTINE W REFLEX MICROSCOPIC - Abnormal; Notable for the following components:   Color, Urine COLORLESS (*)    Leukocytes,Ua MODERATE (*)    Bacteria, UA MANY (*)    All other components within normal limits  CBG MONITORING, ED - Abnormal; Notable for the following components:   Glucose-Capillary 52 (*)    All other components within normal limits  CBG MONITORING, ED - Abnormal; Notable for the following components:   Glucose-Capillary 53 (*)    All other components within normal limits  CBG MONITORING, ED - Abnormal; Notable for the following components:   Glucose-Capillary 106  (*)    All other components within normal limits  TROPONIN T, HIGH SENSITIVITY - Abnormal; Notable for the following components:   Troponin T High Sensitivity 196 (*)    All other components within normal limits  TROPONIN T, HIGH SENSITIVITY - Abnormal; Notable for the following components:   Troponin T High Sensitivity 186 (*)    All  other components within normal limits  RESP PANEL BY RT-PCR (RSV, FLU A&B, COVID)  RVPGX2  URINE CULTURE  CULTURE, BLOOD (ROUTINE X 2)  CULTURE, BLOOD (ROUTINE X 2)  MAGNESIUM   AMMONIA  URINE DRUG SCREEN  LACTIC ACID, PLASMA  LACTIC ACID, PLASMA      EKG  EKG Interpretation Date/Time:  Thursday June 22 2024 12:45:59 EST Ventricular Rate:  58 PR Interval:  159 QRS Duration:  93 QT Interval:  426 QTC Calculation: 419 R Axis:   68  Text Interpretation: Sinus arrhythmia Borderline ST elevation, anterior leads Confirmed by Pamella Sharper [phone]) on 06/22/2024 2:21:48 PM         Imaging Studies ordered: I ordered imaging studies including chest x-ray, CT head I independently visualized and interpreted imaging. I agree with the radiologist interpretation   Medicines ordered and prescription drug management: Meds ordered this encounter  Medications   cefTRIAXone  (ROCEPHIN ) 1 g in sodium chloride  0.9 % 100 mL IVPB    Antibiotic Indication::   UTI   sodium chloride  0.9 % bolus 500 mL    -I have reviewed the patients home medicines and have made adjustments as needed   Consultations Obtained: I requested consultation with the nephrologist Dr. Gearline,  and discussed lab and imaging findings as well as pertinent plan - they recommend: As above    Reevaluation: After the interventions noted above, I reevaluated the [name] [name] and found that they have :stayed the same  Co morbidities that complicate the [name] [name] evaluation  Past Medical History:  Diagnosis Date   Anemia    as a younger man   Arthritis    hands   Cataract     Chronic kidney disease    starting to bother me now (07/28/2012)   Diabetes mellitus     type [name]   Diarrhea, functional    ED (erectile dysfunction)    Hypertension    Neuropathy    Peripheral neuropathy       Dispostion: Hospitalist will evaluate [name] [name] for admission    Final diagnoses:  None    ED Discharge Orders     None          Hildegard Loge, PA-C 06/22/24 1628  "

## 2024-06-22 NOTE — Plan of Care (Addendum)
 Plan of Care Note for accepted transfer   Patient: [name] [name] MRN: [id number]   DOA: 06/22/2024  Facility requesting transfer: [id number] Requesting Provider: Susette Lash PA-C  Reason for transfer: Confusion, Fatigue, Abdominal Pain  Facility course:  The patient is a 74 year old African-American male with past medical history of renal transplant and history of cryptococcal meningitis who presented for not feeling well with generalized weakness, abdominal pain, nausea, headache and dizziness.  Family was concerned given that he had similar symptoms with a headache, neck stiffness a few years ago and was found to have cryptococcal meningitis.  He was having some abdominal pain but no fevers and has been intermittently taking his renal suppressive medications.    Upon arrival to the EDP felt that he was alert and oriented and did not suspect meningitis and his head imaging was negative, however did suspect UTI and administered IV ceftriaxone  and started the patient on 1/2 L fluid bolus.  He.  Given treatment for UTI and he was noted also be hypoglycemic.  Renal function has worsened from his baseline and notably his troponins were also slightly elevated but flat.    Given his weakness and fatigue and concern for UTI and it was on presentation the patient was accepted to [facility] after the EDP discussed with the nephrology team that they felt he was more appropriate to transfer to [facility] than [facility] given his history.  Per EDP discussion with nephrology team Dr. Gearline, she recommends if nephrology consult is needed recommending reconsulting in the AM.  Plan of care: The patient is accepted for admission to Telemetry unit, at [facility]..   Author: Alejandro Marker, DO [facility] 06/22/2024  Check [email or web address] for on-call coverage.  Nursing staff, Please call [facility] Admits & Consults System-Wide number on Amion as soon as patient's arrival, so appropriate admitting  provider can evaluate the pt.

## 2024-06-22 NOTE — ED Triage Notes (Signed)
 Weakness Abdo pain, nausea  Headache Dizziness when standing Pain/ stiff neck For a while  Hx of meningitis family is concern because symtoms are similar

## 2024-06-22 NOTE — ED Notes (Signed)
 Spoke with lab about urine drug screen add-on

## 2024-06-22 NOTE — H&P (Signed)
 " History and Physical    JIYAAN STEINHAUSER II FMW:[id number] DOB: December 31, 1950 DOA: 06/22/2024  PCP: Theophilus Andrews, Tully GRADE, MD   Patient coming from: Home   Chief Complaint: General weakness, lightheaded on standing  HPI: TRAVION KE II is a 74 y.o. male with medical history significant for hypertension, hyperlipidemia, type 2 diabetes mellitus, history of CVA, history of PE no longer anticoagulated, history of kidney transplantation, and CKD stage IV who presents with generalized weakness and lightheadedness on standing.  Patient reports that he had diarrhea 3 days ago.  He was also experiencing pain in the central abdomen.  He has not had any diarrhea in 2 days now and there has not been any abdominal pain since yesterday.  He continues to feel generally weak and develops lightheadedness whenever he stands.  He denies any chest pain, palpitations, focal numbness or weakness, syncope, dysuria, cough, or shortness of breath. He reports chronic LLE swelling and intermittent RLE swelling but does not have any RLE swelling currently.   He notes that he often forgets to take his medications.   [facility] ED Course: Upon arrival to the ED, patient is found to be afebrile and saturating well on room air with normal HR and stable BP.  Labs are most notable for BUN 65, creatinine 4.17, glucose 54, normal WBC, normal lactate, and troponin 196.  There are no acute findings on chest x-ray or head CT.  Blood and urine cultures were collected in the ED, patient was given 1 g IV Rocephin  and 500 mL NS, and he was transferred to [facility] for admission.  Review of Systems:  All other systems reviewed and apart from HPI, are negative.  Past Medical History:  Diagnosis Date   Anemia    as a younger man   Arthritis    hands   Cataract    Chronic kidney disease    starting to bother me now (07/28/2012)   Diabetes mellitus     type II   Diarrhea, functional    ED  (erectile dysfunction)    Hypertension    Neuropathy    Peripheral neuropathy     Past Surgical History:  Procedure Laterality Date   ABDOMINAL AORTOGRAM W/LOWER EXTREMITY N/A 03/13/2024   Procedure: ABDOMINAL AORTOGRAM W/LOWER EXTREMITY;  Surgeon: Pearline Norman RAMAN, MD;  Location: [facility] INVASIVE CV LAB;  Service: Cardiovascular;  Laterality: N/A;   AMPUTATION TOE Right 03/14/2024   Procedure: AMPUTATION, TOE;  Surgeon: Malvin Marsa FALCON, DPM;  Location: [facility] OR;  Service: Orthopedics/Podiatry;  Laterality: Right;  RIGHT GREAT TOE   APPENDECTOMY  1950's   AV FISTULA PLACEMENT Right 05/14/2016   Procedure: ARTERIOVENOUS (AV) FISTULA CREATION- RIGHT ARM;  Surgeon: Gaile LELON New, MD;  Location: [facility] OR;  Service: Vascular;  Laterality: Right;   COLONOSCOPY     KIDNEY TRANSPLANT Right    LOWER EXTREMITY ANGIOGRAPHY Right 03/13/2024   Procedure: Lower Extremity Angiography;  Surgeon: Pearline Norman RAMAN, MD;  Location: [facility] INVASIVE CV LAB;  Service: Cardiovascular;  Laterality: Right;   LOWER EXTREMITY INTERVENTION  03/13/2024   Procedure: LOWER EXTREMITY INTERVENTION;  Surgeon: Pearline Norman RAMAN, MD;  Location: [facility] INVASIVE CV LAB;  Service: Cardiovascular;;   PERIPHERAL INTRAVASCULAR LITHOTRIPSY  03/13/2024   Procedure: PERIPHERAL INTRAVASCULAR LITHOTRIPSY;  Surgeon: Pearline Norman RAMAN, MD;  Location: [facility] INVASIVE CV LAB;  Service: Cardiovascular;;   PROSTATE SURGERY     UPPER GASTROINTESTINAL ENDOSCOPY      Social History:   reports  that he quit smoking about 38 years ago. His smoking use included cigarettes. He started smoking about 48 years ago. He has a 1.2 pack-year smoking history. He has never used smokeless tobacco. He reports that he does not currently use alcohol after a past usage of about 1.0 standard drink of alcohol per week. He reports that he does not use drugs.  Allergies[1]  Family History  Problem Relation Age of Onset   Colon polyps Mother    Stroke Mother    Diabetes  Father    Diabetes Cousin    Prostate cancer Brother    Other Daughter        kidney problems   Esophageal cancer Maternal Aunt    Colon cancer Neg Hx    Stomach cancer Neg Hx    Rectal cancer Neg Hx      Prior to Admission medications  Medication Sig Start Date End Date Taking? Authorizing Provider  amLODipine  (NORVASC ) 2.5 MG tablet Take 2.5 mg by mouth daily. 08/16/21  Yes [provider]  aspirin  EC 81 MG tablet Take 81 mg by mouth daily. 02/06/24 02/05/25 Yes [provider]  atorvastatin  (LIPITOR) 40 MG tablet Take 1 tablet (40 mg total) by mouth daily. 01/24/24  Yes Elpidio Reyes DEL, MD  BAQSIMI ONE PACK 3 MG/DOSE POWD Place 1 spray into the nose daily as needed for low blood sugar. 05/13/21  Yes [provider]  calcitRIOL  (ROCALTROL ) 0.25 MCG capsule Take 0.25 mcg by mouth every Monday, Wednesday, and Friday. 11/22/23 11/21/24 Yes [provider]  carvedilol  (COREG ) 12.5 MG tablet Take 12.5 mg by mouth 2 (two) times daily with a meal.   Yes [provider]  famotidine  (PEPCID ) 20 MG tablet Take 20 mg by mouth daily. 09/12/20  Yes [provider]  folic acid  (FOLVITE ) 1 MG tablet Take 1 mg by mouth daily. 09/12/20  Yes [provider]  furosemide  (LASIX ) 40 MG tablet Take 40 mg by mouth daily. 07/19/21  Yes [provider]  insulin  glargine (LANTUS  SOLOSTAR) 100 UNIT/ML Solostar Pen Inject 6 Units into the skin at bedtime.   Yes [provider]  insulin  lispro (HUMALOG KWIKPEN) 100 UNIT/ML KwikPen Inject 0-15 Units into the skin 3 (three) times daily as needed (high blood sugar).   Yes [provider]  Melatonin 10 MG TABS Take 10 mg by mouth at bedtime.   Yes [provider]  predniSONE  (DELTASONE ) 5 MG tablet Take 5 mg by mouth daily with breakfast.   Yes [provider]  Semaglutide,0.25 or 0.5MG /DOS, 2 MG/3ML SOPN Inject 0.25 mg into the skin once a week. Sundays 08/16/23  Yes  [provider]  sevelamer  carbonate (RENVELA ) 800 MG tablet  10/11/17  Yes [provider]  tacrolimus  (PROGRAF ) 1 MG capsule Take 3 mg by mouth 2 (two) times daily.   Yes [provider]  thiamine  100 MG tablet Take 100 mg by mouth daily. 09/11/20  Yes [provider]    Physical Exam: Vitals:   06/22/24 1600 06/22/24 1615 06/22/24 1814 06/22/24 1857  BP: 116/79 130/65 (!) 153/81 139/69  Pulse: (!) 55 (!) 52 63 (!) 58  Resp: 14 11 16 18   Temp:    98.2 F (36.8 C)  TempSrc:      SpO2: 100% 100% 100% 100%    Constitutional: NAD, no pallor or diaphoresis   Eyes: PERTLA, lids and conjunctivae normal ENMT: Mucous membranes are dry. Posterior pharynx clear of any exudate or  lesions.   Neck: supple, no masses  Respiratory: no wheezing, no crackles. No accessory muscle use.  Cardiovascular: S1 & S2 heard, regular rate and rhythm. Left lower extremity edema.  Abdomen: Soft, no guarding. Bowel sounds active.  Musculoskeletal: no clubbing / cyanosis. No joint deformity upper and lower extremities.   Skin: no significant rashes, lesions, ulcers. Warm, dry, well-perfused. Neurologic: CN 2-12 grossly intact. Moving all extremities. Alert and oriented to person, place, and situation.  Psychiatric: Calm. Cooperative.    Labs and Imaging on Admission: I have personally reviewed following labs and imaging studies  CBC: Recent Labs  Lab 06/22/24 1223  WBC 7.4  NEUTROABS 5.8  HGB 11.5*  HCT 35.7*  MCV 86.0  PLT 231   Basic Metabolic Panel: Recent Labs  Lab 06/22/24 1223  NA 140  K 4.6  CL 106  CO2 21*  GLUCOSE 54*  BUN 65*  CREATININE 4.17*  CALCIUM  10.3  MG 2.3   GFR: CrCl cannot be calculated (Unknown ideal weight.). Liver Function Tests: Recent Labs  Lab 06/22/24 1223  AST 14*  ALT 9  ALKPHOS 59  BILITOT 0.3  PROT 7.7  ALBUMIN 4.2   No results for input(s): LIPASE, AMYLASE in the last 168 hours. Recent Labs  Lab  06/22/24 1223  AMMONIA <13   Coagulation Profile: No results for input(s): INR, PROTIME in the last 168 hours. Cardiac Enzymes: No results for input(s): CKTOTAL, CKMB, CKMBINDEX, TROPONINI in the last 168 hours. BNP (last 3 results) No results for input(s): PROBNP in the last 8760 hours. HbA1C: No results for input(s): HGBA1C in the last 72 hours. CBG: Recent Labs  Lab 06/22/24 1315 06/22/24 1336 06/22/24 1411  GLUCAP 52* 53* 106*   Lipid Profile: No results for input(s): CHOL, HDL, LDLCALC, TRIG, CHOLHDL, LDLDIRECT in the last 72 hours. Thyroid  Function Tests: No results for input(s): TSH, T4TOTAL, FREET4, T3FREE, THYROIDAB in the last 72 hours. Anemia Panel: No results for input(s): VITAMINB12, FOLATE, FERRITIN, TIBC, IRON, RETICCTPCT in the last 72 hours. Urine analysis:    Component Value Date/Time   COLORURINE COLORLESS (A) 06/22/2024 1223   APPEARANCEUR CLEAR 06/22/2024 1223   LABSPEC 1.009 06/22/2024 1223   PHURINE 5.0 06/22/2024 1223   GLUCOSEU NEGATIVE 06/22/2024 1223   HGBUR NEGATIVE 06/22/2024 1223   HGBUR trace-lysed 06/16/2010 0833   BILIRUBINUR NEGATIVE 06/22/2024 1223   BILIRUBINUR n 05/22/2015 0927   KETONESUR NEGATIVE 06/22/2024 1223   PROTEINUR NEGATIVE 06/22/2024 1223   UROBILINOGEN 0.2 05/22/2015 0927   UROBILINOGEN 0.2 07/31/2014 2130   NITRITE NEGATIVE 06/22/2024 1223   LEUKOCYTESUR MODERATE (A) 06/22/2024 1223   Sepsis Labs: @LABRCNTIP (procalcitonin:4,lacticidven:4) ) Recent Results (from the past 240 hours)  Resp panel by RT-PCR (RSV, Flu A&B, Covid) Anterior Nasal Swab     Status: None   Collection Time: 06/22/24 12:23 PM   Specimen: Anterior Nasal Swab  Result Value Ref Range Status   SARS Coronavirus 2 by RT PCR NEGATIVE NEGATIVE Final    Comment: (NOTE) SARS-CoV-2 target nucleic acids are NOT DETECTED.  The SARS-CoV-2 RNA is generally detectable in upper respiratory specimens during  the acute phase of infection. The lowest concentration of SARS-CoV-2 viral copies this assay can detect is 138 copies/mL. A negative result does not preclude SARS-Cov-2 infection and should not be used as the sole basis for treatment or other patient management decisions. A negative result may occur with  improper specimen collection/handling, submission of specimen other than nasopharyngeal swab, presence of viral mutation(s) within the  areas targeted by this assay, and inadequate number of viral copies(<138 copies/mL). A negative result must be combined with clinical observations, patient history, and epidemiological information. The expected result is Negative.  Fact Sheet for Patients:  bloggercourse.com  Fact Sheet for Healthcare Providers:  seriousbroker.it  This test is no t yet approved or cleared by the [place]  FDA and  has been authorized for detection and/or diagnosis of SARS-CoV-2 by FDA under an Emergency Use Authorization (EUA). This EUA will remain  in effect (meaning this test can be used) for the duration of the COVID-19 declaration under Section 564(b)(1) of the Act, 21 U.S.C.section 360bbb-3(b)(1), unless the authorization is terminated  or revoked sooner.       Influenza A by PCR NEGATIVE NEGATIVE Final   Influenza B by PCR NEGATIVE NEGATIVE Final    Comment: (NOTE) The Xpert Xpress SARS-CoV-2/FLU/RSV plus assay is intended as an aid in the diagnosis of influenza from Nasopharyngeal swab specimens and should not be used as a sole basis for treatment. Nasal washings and aspirates are unacceptable for Xpert Xpress SARS-CoV-2/FLU/RSV testing.  Fact Sheet for Patients: bloggercourse.com  Fact Sheet for Healthcare Providers: seriousbroker.it  This test is not yet approved or cleared by the [place]  FDA and has been authorized for detection and/or  diagnosis of SARS-CoV-2 by FDA under an Emergency Use Authorization (EUA). This EUA will remain in effect (meaning this test can be used) for the duration of the COVID-19 declaration under Section 564(b)(1) of the Act, 21 U.S.C. section 360bbb-3(b)(1), unless the authorization is terminated or revoked.     Resp Syncytial Virus by PCR NEGATIVE NEGATIVE Final    Comment: (NOTE) Fact Sheet for Patients: bloggercourse.com  Fact Sheet for Healthcare Providers: seriousbroker.it  This test is not yet approved or cleared by the [place]  FDA and has been authorized for detection and/or diagnosis of SARS-CoV-2 by FDA under an Emergency Use Authorization (EUA). This EUA will remain in effect (meaning this test can be used) for the duration of the COVID-19 declaration under Section 564(b)(1) of the Act, 21 U.S.C. section 360bbb-3(b)(1), unless the authorization is terminated or revoked.  Performed at [facility], [address] [place], [place] [address]   Blood culture (routine x 2)     Status: None (Preliminary result)   Collection Time: 06/22/24  4:55 PM   Specimen: BLOOD LEFT FOREARM  Result Value Ref Range Status   Specimen Description   Final    BLOOD LEFT FOREARM Performed at [facility], [address], [place], [place] [address]    Special Requests   Final    BOTTLES DRAWN AEROBIC AND ANAEROBIC Blood Culture adequate volume Performed at [facility], [address], [place], [place] [address]    Culture PENDING  Incomplete   Report Status PENDING  Incomplete     Radiological Exams on Admission: CT Head Wo Contrast Result Date: 06/22/2024 EXAM: CT HEAD WITHOUT CONTRAST 06/22/2024 01:30:49 PM TECHNIQUE: CT of the head was performed without the administration of intravenous contrast. Automated exposure control, iterative reconstruction, and/or weight based adjustment of the  mA/kV was utilized to reduce the radiation dose to as low as reasonably achievable. COMPARISON: CT head 01/18/2024. CLINICAL HISTORY: Headache, new onset (Age >= 51y) FINDINGS: BRAIN AND VENTRICLES: No acute hemorrhage. No evidence of acute infarct. No hydrocephalus. No extra-axial collection. No mass effect or midline shift. Patchy white matter hypodensities, compatible with chronic microvascular ischemic change. ORBITS: No acute abnormality. SINUSES: No acute  abnormality. SOFT TISSUES AND SKULL: No acute soft tissue abnormality. No skull fracture. IMPRESSION: 1. No acute intracranial abnormality. Electronically signed by: Glendia Molt MD 06/22/2024 01:46 PM EST RP Workstation: [id number]   DG Chest Portable 1 View Result Date: 06/22/2024 CLINICAL DATA:  Weakness. EXAM: PORTABLE CHEST 1 VIEW COMPARISON:  January 18, 2024. FINDINGS: The heart size and mediastinal contours are within normal limits. Both lungs are clear. The visualized skeletal structures are unremarkable. IMPRESSION: No active disease. Electronically Signed   By: Lynwood Landy Raddle M.D.   On: 06/22/2024 13:45    EKG: Independently reviewed. Sinus arrhythmia.   Assessment/Plan   1. AKI superimposed on CKD IV involving transplanted kidney  - SCr 4.17 in ED; baseline appears to be ~3.6 - Suspect this is prerenal given recent diarrhea and lightheadedness on standing  - Hold Lasix , check FEUrea, renal US  with Doppler, and tacrolimus  level, continue IVF hydration, continue prednisone  and tacrolimus , repeat serum chemistries in am    2. General weakness  - Ammonia is undetectable and there are no acute findings on head CT in ED   - May be due to recent diarrhea with dehydration, AKI, and hypoglycemia - Check CK, TSH, and B12, address AKI as above, monitor for/treat recurrent hypoglycemia, consult PT   3. Hypoglycemia; type II DM  - A1c was 9.2% in November 2025  - Check CBGs and use low-intensity SSI only for now    4. Elevated troponin    - Troponin elevated but decreasing in ED  - Patient denies any recent chest discomfort or SOB and there is low suspicion for ACS    5. Hx of CVA  - Continue Lipitor and ASA    6. Abdominal pain and diarrhea  - No diarrhea in 2 days now and no abdominal pain since yesterday  7. Asymptomatic bacteriuria  - Blood and urine cultures were collected in ED and patient was given 1 g IV Rocephin   - Patient does not have any urinary symptoms and no SIRS criteria are met, will hold off on additional antibiotics for now    DVT prophylaxis: sq heparin   Code Status: Full  Level of Care: Level of care: Telemetry Family Communication: Wife updated from ED  Disposition Plan:  Patient is from: Home   Anticipated d/c is to: TBD Anticipated d/c date is: 06/24/24 Patient currently: Pending improved renal function, PT eval, clinical stability  Consults called: None  Admission status: Inpatient     Evalene GORMAN Sprinkles, MD [facility]  06/22/2024, 8:14 PM       [1]  Allergies Allergen Reactions   Other Rash    Oxyquinoline-white Pet-lanolin   Aquamed Rash   Bag Balm  [Albolene] Rash   Cold Cream Rash   "

## 2024-06-22 NOTE — ED Notes (Signed)
CBG recheck 106

## 2024-06-22 NOTE — ED Notes (Signed)
 George from [facility] called for transport 17:20-TC

## 2024-06-22 NOTE — ED Notes (Signed)
 Pt CBG obtained d/t pt concern of blood sugar. CBG 52. EDP notified. Pt given juice and crackers.

## 2024-06-23 ENCOUNTER — Inpatient Hospital Stay (HOSPITAL_COMMUNITY)

## 2024-06-23 LAB — CBC
HCT: 33.3 % — ABNORMAL LOW (ref 39.0–52.0)
Hemoglobin: 10.7 g/dL — ABNORMAL LOW (ref 13.0–17.0)
MCH: 27.9 pg (ref 26.0–34.0)
MCHC: 32.1 g/dL (ref 30.0–36.0)
MCV: 86.7 fL (ref 80.0–100.0)
Platelets: 207 10*3/uL (ref 150–400)
RBC: 3.84 MIL/uL — ABNORMAL LOW (ref 4.22–5.81)
RDW: 13 % (ref 11.5–15.5)
WBC: 5.3 10*3/uL (ref 4.0–10.5)
nRBC: 0 % (ref 0.0–0.2)

## 2024-06-23 LAB — BASIC METABOLIC PANEL WITH GFR
Anion gap: 10 (ref 5–15)
BUN: 68 mg/dL — ABNORMAL HIGH (ref 8–23)
CO2: 20 mmol/L — ABNORMAL LOW (ref 22–32)
Calcium: 8.7 mg/dL — ABNORMAL LOW (ref 8.9–10.3)
Chloride: 107 mmol/L (ref 98–111)
Creatinine, Ser: 3.84 mg/dL — ABNORMAL HIGH (ref 0.61–1.24)
GFR, Estimated: 16 mL/min — ABNORMAL LOW
Glucose, Bld: 460 mg/dL — ABNORMAL HIGH (ref 70–99)
Potassium: 5.2 mmol/L — ABNORMAL HIGH (ref 3.5–5.1)
Sodium: 137 mmol/L (ref 135–145)

## 2024-06-23 LAB — URINE CULTURE: Culture: >=100000 — AB

## 2024-06-23 LAB — CULTURE, BLOOD (ROUTINE X 2)
Culture: NO GROWTH
Culture: NO GROWTH
Special Requests: ADEQUATE
Special Requests: ADEQUATE

## 2024-06-23 LAB — GLUCOSE, CAPILLARY
Glucose-Capillary: 105 mg/dL — ABNORMAL HIGH (ref 70–99)
Glucose-Capillary: 151 mg/dL — ABNORMAL HIGH (ref 70–99)
Glucose-Capillary: 171 mg/dL — ABNORMAL HIGH (ref 70–99)
Glucose-Capillary: 221 mg/dL — ABNORMAL HIGH (ref 70–99)
Glucose-Capillary: 400 mg/dL — ABNORMAL HIGH (ref 70–99)
Glucose-Capillary: 67 mg/dL — ABNORMAL LOW (ref 70–99)

## 2024-06-23 LAB — HEMOGLOBIN A1C
Hgb A1c MFr Bld: 8.5 % — ABNORMAL HIGH (ref 4.8–5.6)
Mean Plasma Glucose: 197.25 mg/dL

## 2024-06-23 LAB — PHOSPHORUS: Phosphorus: 4 mg/dL (ref 2.5–4.6)

## 2024-06-23 LAB — SODIUM, URINE, RANDOM: Sodium, Ur: 81 mmol/L

## 2024-06-23 LAB — CREATININE, URINE, RANDOM: Creatinine, Urine: 48 mg/dL

## 2024-06-23 MED ORDER — INSULIN GLARGINE-YFGN 100 UNIT/ML ~~LOC~~ SOLN
6.0000 [IU] | Freq: Two times a day (BID) | SUBCUTANEOUS | Status: DC
  Administered 2024-06-23 (×2): 6 [IU] via SUBCUTANEOUS
  Filled 2024-06-23 (×3): qty 0.06

## 2024-06-23 MED ORDER — INSULIN GLARGINE-YFGN 100 UNIT/ML ~~LOC~~ SOLN
6.0000 [IU] | Freq: Every day | SUBCUTANEOUS | Status: AC
  Filled 2024-06-23: qty 0.06

## 2024-06-23 MED ORDER — SODIUM ZIRCONIUM CYCLOSILICATE 10 G PO PACK
10.0000 g | PACK | Freq: Once | ORAL | Status: AC
  Administered 2024-06-23: 10 g via ORAL
  Filled 2024-06-23: qty 1

## 2024-06-23 MED ORDER — INSULIN ASPART 100 UNIT/ML IJ SOLN
6.0000 [IU] | Freq: Three times a day (TID) | INTRAMUSCULAR | Status: DC
  Administered 2024-06-23: 6 [IU] via SUBCUTANEOUS
  Filled 2024-06-23: qty 6

## 2024-06-23 MED ORDER — SODIUM CHLORIDE 0.9 % IV SOLN
1.0000 g | INTRAVENOUS | Status: AC
  Administered 2024-06-23: 1 g via INTRAVENOUS
  Filled 2024-06-23: qty 10

## 2024-06-23 MED ORDER — INSULIN ASPART 100 UNIT/ML IJ SOLN
2.0000 [IU] | Freq: Three times a day (TID) | INTRAMUSCULAR | Status: AC
  Administered 2024-06-23: 2 [IU] via SUBCUTANEOUS
  Filled 2024-06-23: qty 2

## 2024-06-23 MED ORDER — SODIUM CHLORIDE 0.9 % IV SOLN: INTRAVENOUS | Status: AC

## 2024-06-23 NOTE — TOC CM/SW Note (Signed)
 Transition of Care [facility]) - Inpatient Brief Assessment   Patient Details  Name: ROBERTA [name] MRN: [id number] Date of Birth: 26-Jul-1950  Transition of Care [facility]) CM/SW Contact:    [name] [name], [name] [name] Daphne, RN Phone Number: 06/23/2024, 3:05 PM   Clinical Narrative:  Patient presented to the ED with General Weakness, Lightheaded, Abdominal pain, Nausea, Headache, Neck Pain/Stiffness and Diarrhea. Labs showed BUN 65, Creatinine 4.17, Glucose 54. Admitted with Acute renal failure on CKD 4. UA showed Pyuria and Bacteria, Urine cx with GNR. On IV abx. Has hx of CVA, PE, Chronic LLE Swelling, HLD, HTN, Renal Transplant, T2DM.  CM spoke with patient at bedside about needs for post hospital transition. Patient lives with his wife, daughter and two grandchildren. Has three children and four supportive siblings. Modified independent, has a cane, walker, shower seat and rollator at home. Does not drive, family transports to and from appointments.   PCP is [name] [name], [name] GRADE, MD and uses [facility] on [address].   No PT f/u, ICM needs or recommendations noted at this time.  Patient not Medically ready for discharge.  CM will continue to follow as patient progresses with care towards discharge.       Transition of Care Asessment: Insurance and Status: Insurance coverage has been reviewed Patient has primary care physician: Yes Home environment has been reviewed: Yes Prior level of function:: Modified Independent Prior/Current Home Services: No current home services Social Drivers of Health Review: SDOH reviewed no interventions necessary Readmission risk has been reviewed: Yes Transition of care needs: no transition of care needs at this time

## 2024-06-23 NOTE — Evaluation (Signed)
 Physical Therapy Evaluation Patient Details Name: [name] [name] MRN: [id number] DOB: 1950-10-24 Today's Date: 06/23/2024  History of Present Illness  Pt is 74 yo male presenting to [facility] on 2/5 due to lightheadedness when standing and generalized weakness. Head CT and chest xray negative. PMH: renal transplant on immunosuppression therapy, PE on eliquis , HTN, HLD, DM II p/w AKI on CKD.  Clinical Impression  Prior to admision, pt was living in a [facility] with his spouse, daughter and granddaughters. He was IND with all mobility and occasionally used RW or rollator for community distances over uneven surfaces. He arrived to ED for above noted reason. He provides additional details that he feels pressure and pain along posterior neck and shoulders when he is static standing for long time (gave example of singing in church choir with long songs that get drawn out), and will feel the need to sit because of the pain and general weakness. He is unable to state if he has changes in sensation or vision during these episodes. Currently he requires SPV/CGA for all mobility, walking 200' with RW and no balance deficits noted. Pt denied any sxs presentation during functional mobility tasks of evaluation. PT educated pt on safety measures at home including use of DME, turning lights on and removing rugs to prevent falls, seated rest breaks, monitoring sxs when they arise with standing activity, and rest breaks as needed. Pt appears to be as his functional baseline and owns all necessary equipment for safe discharge home with support from family. Recommend he continues to mobilize with mobility team and nursing staff while admitted to acute setting, however no follow up therapy needs. PT will sign off at this time. Please re-consult if there is a change in medical status.      If plan is discharge home, recommend the following: Assist for transportation;Assistance with cooking/housework   Can travel by private vehicle         Equipment Recommendations None recommended by PT  Recommendations for Other Services       Functional Status Assessment Patient has had a recent decline in their functional status and demonstrates the ability to make significant improvements in function in a reasonable and predictable amount of time.     Precautions / Restrictions Precautions Precautions: None Recall of Precautions/Restrictions: Intact Restrictions Weight Bearing Restrictions Per Provider Order: No      Mobility  Bed Mobility Overal bed mobility: Modified Independent             General bed mobility comments: Completed with HOB slightly elevated, no use of bedrail, able to manage covers and sit up on his own    Transfers Overall transfer level: Needs assistance Equipment used: Rolling walker (2 wheels) Transfers: Sit to/from Stand Sit to Stand: Supervision           General transfer comment: from low bed with RW, demos good hand placement    Ambulation/Gait Ambulation/Gait assistance: Supervision, Contact guard assist Gait Distance (Feet): 200 Feet Assistive device: Rolling walker (2 wheels) Gait Pattern/deviations: Step-through pattern Gait velocity: normal     General Gait Details: good upright posture, no complaints of dizziness or pressure on posterior neck  Stairs            Wheelchair Mobility     Tilt Bed    Modified Rankin (Stroke Patients Only)       Balance Overall balance assessment: No apparent balance deficits (not formally assessed)  Pertinent Vitals/Pain Pain Assessment Pain Assessment: No/denies pain    Home Living Family/patient expects to be discharged to:: Private residence Living Arrangements: Spouse/significant other;Children (daughter and granddaughter) Available Help at Discharge: Family;Available 24 hours/day Type of Home: House Home Access: Stairs to enter Entrance  Stairs-Rails: Right;Left;Can reach both Entrance Stairs-Number of Steps: 2   Home Layout: One level Home Equipment: [employer]ant (2 wheels);Rollator (4 wheels);Cane - single point;Tub bench      Prior Function Prior Level of Function : Independent/Modified Independent;History of Falls (last six months)             Mobility Comments: no AD typically, but occasionally using cane/RW if in community, rollator stays in the trunk to use as needed, x1 fall in the last 6 months due to toileting urgency and kepts lights off and tripped/slipped ADLs Comments: ind, spouse or daughter provides transportation     Extremity/Trunk Assessment   Upper Extremity Assessment Upper Extremity Assessment: Defer to OT evaluation    Lower Extremity Assessment Lower Extremity Assessment: Overall WFL for tasks assessed    Cervical / Trunk Assessment Cervical / Trunk Assessment: Normal  Communication   Communication Communication: No apparent difficulties    Cognition Arousal: Alert Behavior During Therapy: WFL for tasks assessed/performed   PT - Cognitive impairments: No apparent impairments                         Following commands: Intact       Cueing Cueing Techniques: Verbal cues, Tactile cues     General Comments      Exercises     Assessment/Plan    PT Assessment Patient does not need any further PT services  PT Problem List         PT Treatment Interventions      PT Goals (Current goals can be found in the Care Plan section)  Acute Rehab PT Goals Patient Stated Goal: get back home PT Goal Formulation: With patient Time For Goal Achievement: 07/07/24 Potential to Achieve Goals: Good    Frequency       Co-evaluation               AM-PAC PT 6 Clicks Mobility  Outcome Measure Help needed turning from your back to your side while in a flat bed without using bedrails?: None Help needed moving from lying on your back to sitting on the side of a  flat bed without using bedrails?: None Help needed moving to and from a bed to a chair (including a wheelchair)?: A Little Help needed standing up from a chair using your arms (e.g., wheelchair or bedside chair)?: None Help needed to walk in hospital room?: A Little Help needed climbing 3-5 steps with a railing? : A Little 6 Click Score: 21    End of Session Equipment Utilized During Treatment: Gait belt Activity Tolerance: Patient tolerated treatment well Patient left: in chair;with call bell/phone within reach Nurse Communication: Mobility status PT Visit Diagnosis: Muscle weakness (generalized) (M62.81)    Time: 1000-1037 PT Time Calculation (min) (ACUTE ONLY): 37 min   Charges:   PT Evaluation $PT Eval Low Complexity: 1 Low PT Treatments $Gait Training: 8-22 mins $Therapeutic Activity: 8-22 mins PT General Charges $$ ACUTE PT VISIT: 1 Visit         Isaiah H. Keileigh Vahey, PT, DPT   [employer] 06/23/2024, 10:42 AM

## 2024-06-23 NOTE — Inpatient Diabetes Management (Signed)
 Inpatient Diabetes Program Recommendations  AACE/ADA: New Consensus Statement on Inpatient Glycemic Control (2015)  Target Ranges:  Prepandial:   less than 140 mg/dL      Peak postprandial:   less than 180 mg/dL (1-2 hours)      Critically ill patients:  140 - 180 mg/dL   Lab Results  Component Value Date   GLUCAP 105 (H) 06/23/2024   HGBA1C 9.2 (A) 03/22/2024    Review of Glycemic Control  Latest Reference Range & Units 06/22/24 22:06 06/23/24 00:24 06/23/24 08:00 06/23/24 09:04  Glucose-Capillary 70 - 99 mg/dL 587 (H) 599 (H) 67 (L) 105 (H)  (H): Data is abnormally high (L): Data is abnormally low Diabetes history: Type 2 DM Outpatient Diabetes medications: Lantus  6 units every day, Humalog 0-15 units TID, Ozempic 0.25 mg qwk Current orders for Inpatient glycemic control: Novolog  0-6 units TID & HS   Inpatient Diabetes Program Recommendations:    Noted patient received Novolog  6 units of meal coverage at 0100, hypoglycemia this AM  and addition of insulin .   Consider: -Reducing Lantus  to 6 units every day -Changing diet to carb modified -A1C?   Thanks, Tinnie Minus, MSN, RNC-OB Diabetes Coordinator [phone] (8a-5p)

## 2024-06-23 NOTE — Progress Notes (Signed)
 " PROGRESS NOTE  [name] [name] FMW:[id number] DOB: Oct 25, 1950   PCP: Theophilus Andrews, Tully GRADE, MD  Patient is from: Home.  Lives with his wife daughter and grandchildren.  Independently ambulates at baseline.  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  In ED, elevated BP 2 160/100. Cr 4.17.  BUN 66.  BMP glucose 54.  Hgb 11.5.  Troponin 196 and 186.  UA with moderate LE and many bacteria.  COVID-19, influenza and RSV PCR nonreactive.  CT head and CXR without acute finding.  EKG sinus arrhythmia.  UDS negative.  Renal ultrasound, cultures and tacrolimus  levels ordered.  Patient was started on IV fluid and IV ceftriaxone  and admitted.  The next day, ultrasound of transplanted kidney with Doppler without significant finding.  AKI and symptoms improved.  Urine culture with GNR.  Blood cultures NGTD.  Ceftriaxone  resumed.  Continued on IV fluid.  Subjective: Seen and examined earlier this morning.  No major events overnight or this morning.  Reports feeling better.  No complaints.  Denies chest pain, shortness of breath, dizziness, nausea, vomiting or abdominal pain.  Denies UTI symptoms.  Denies NSAID use.   Assessment and plan: AKI on CKD-4: b/l Cr ~3.6.  Patient denies NSAID use.  Seems to be on Lasix  at home. History of renal transplant-ultrasound of transplanted kidney with Doppler without significant finding. -Continue IV fluid -Continue prednisone  and Prograf  -Follow-up Prograf  level -Treat UTI as below.  Urinary tract infection: UA with pyuria and  bacteria.  Urine culture with GNR.  Patient denies UTI symptoms.  Given history of renal transplant and immunocompromised status, it is safer to regarded as true infection and treat. - Resume IV ceftriaxone  - Follow culture speciation and sensitivity.    General weakness/dizziness: TSH and CK normal.  B12 elevated.  Improved. -Treat AKI and UTI as above - Orthostatic vitals - PT/OT eval   DM-2 with level 2 hypoglycemia and hyperglycemia: A1c 9.2% in 03/2024 Recent Labs  Lab 06/22/24 2206 06/23/24 0024 06/23/24 0800 06/23/24 0904 06/23/24 1131  GLUCAP 412* 400* 67* 105* 151*  -Decrease Lantus  from 60 units twice daily to 6 units daily -Decrease NovoLog  from 6 units 3 times daily with meals to 2 units 3 times daily with meals -Continue SSI-very sensitive -Further adjustment as appropriate. - Recheck hemoglobin A1c.  Elevated troponin/demand ischemia: This is likely in the setting of AKI.  No chest pain.  EKG nonacute.  No significant delta either. - Treat UTI and AKI as above  Anemia of renal disease: Relatively stable.  No report of overt bleeding. - Continue monitoring   Abdominal pain and diarrhea: Resolved about 2 days prior to arrival.  Abdominal exam benign.  Chronic LLE swelling: Patient with history of PAD in RLE.  He is followed by vascular surgery.  No recent venous Doppler per chart review.  Has history of PE. -Will obtain lower extremity venous Doppler to rule out DVT.   History of CVA - Continue Lipitor and ASA    Hyperkalemia: Mild. - P.o. Lokelma  10 g x 1 - Continue IV NS.  There is no height or weight on file to calculate BMI.           DVT prophylaxis:  heparin  injection 5,000 Units Start: 06/22/24 2200  Code Status: Full code Family Communication: None at bedside Level of care: Telemetry Status is: Inpatient Remains inpatient appropriate because: Generalized weakness, AKI and UTI in immunocompromised patient   Final disposition: Home   55  minutes with more than 50% spent in reviewing records, counseling patient/family and coordinating care.  Consultants:  None  Procedures: None  Microbiology summarized: Blood cultures NGTD Urine culture with GNR COVID-19, influenza and RSV PCR nonreactive  Objective: Vitals:   06/23/24 0905 06/23/24 0907 06/23/24 0909 06/23/24 0912  BP: 124/72 133/63 (!) 123/58 110/69  Pulse: 65 66 73 74  Resp:      Temp:      TempSrc:      SpO2: 100% 100% 100% 100%    Examination:  GENERAL: No apparent distress.  Nontoxic. HEENT: MMM.  Vision and hearing grossly intact.  NECK: Supple.  No apparent JVD.  RESP:  No IWOB.  Fair aeration bilaterally. CVS:  RRR. Heart sounds normal.  ABD/GI/GU: BS+. Abd soft, NTND.  MSK/EXT:  Moves extremities.  LLE > RLE (chronic). SKIN: no apparent skin lesion or wound NEURO: AA.  Oriented appropriately.  No apparent focal neuro deficit. PSYCH: Calm. Normal affect.   Sch Meds:  Scheduled Meds:  aspirin  EC  81 mg Oral Daily   atorvastatin   40 mg Oral Daily   folic acid   1 mg Oral Daily   heparin   5,000 Units Subcutaneous Q8H   insulin  aspart  0-5 Units Subcutaneous QHS   insulin  aspart  0-6 Units Subcutaneous TID WC   insulin  aspart  2 Units Subcutaneous TID WC   [START ON 06/24/2024] insulin  glargine-yfgn  6 Units Subcutaneous Daily   predniSONE   5 mg Oral Q breakfast   sodium chloride  flush  3 mL Intravenous Q12H   sodium zirconium cyclosilicate   10 g Oral Once   tacrolimus   3 mg Oral BID   thiamine   100 mg Oral Daily   Continuous Infusions:  sodium chloride      cefTRIAXone  (ROCEPHIN )  IV     PRN Meds:.acetaminophen  **OR** acetaminophen , fentaNYL  (SUBLIMAZE ) injection, melatonin, oxyCODONE , prochlorperazine , senna  Antimicrobials: Anti-infectives (From admission, onward)    Start     Dose/Rate Route Frequency Ordered Stop   06/23/24 1330  cefTRIAXone  (ROCEPHIN ) 1 g in sodium chloride  0.9 % 100 mL IVPB        1 g 200 mL/hr over 30 Minutes  Intravenous Every 24 hours 06/23/24 1238 06/27/24 1329   06/22/24 1545  cefTRIAXone  (ROCEPHIN ) 1 g in sodium chloride  0.9 % 100 mL IVPB        1 g 200 mL/hr over 30 Minutes Intravenous  Once 06/22/24 1533 06/22/24 1705        I have personally reviewed the following labs and images: CBC: Recent Labs  Lab 06/22/24 1223 06/23/24 0247  WBC 7.4 5.3  NEUTROABS 5.8  --   HGB 11.5* 10.7*  HCT 35.7* 33.3*  MCV 86.0 86.7  PLT 231 207   BMP &GFR Recent Labs  Lab 06/22/24 1223 06/23/24 0247  NA 140 137  K 4.6 5.2*  CL 106 107  CO2 21* 20*  GLUCOSE 54* 460*  BUN 65* 68*  CREATININE 4.17* 3.84*  CALCIUM  10.3 8.7*  MG 2.3  --   PHOS  --  4.0   CrCl cannot be  calculated (Unknown ideal weight.). Liver & Pancreas: Recent Labs  Lab 06/22/24 1223  AST 14*  ALT 9  ALKPHOS 59  BILITOT 0.3  PROT 7.7  ALBUMIN 4.2   No results for input(s): LIPASE, AMYLASE in the last 168 hours. Recent Labs  Lab 06/22/24 1223  AMMONIA <13   Diabetic: No results for input(s): HGBA1C in the last 72 hours. Recent Labs  Lab 06/22/24 2206 06/23/24 0024 06/23/24 0800 06/23/24 0904 06/23/24 1131  GLUCAP 412* 400* 67* 105* 151*   Cardiac Enzymes: Recent Labs  Lab 06/22/24 2053  CKTOTAL 67   No results for input(s): PROBNP in the last 8760 hours. Coagulation Profile: No results for input(s): INR, PROTIME in the last 168 hours. Thyroid  Function Tests: Recent Labs    06/22/24 2053  TSH 0.878   Lipid Profile: No results for input(s): CHOL, HDL, LDLCALC, TRIG, CHOLHDL, LDLDIRECT in the last 72 hours. Anemia Panel: Recent Labs    06/22/24 2053  VITAMINB12 989*   Urine analysis:    Component Value Date/Time   COLORURINE COLORLESS (A) 06/22/2024 1223   APPEARANCEUR CLEAR 06/22/2024 1223   LABSPEC 1.009 06/22/2024 1223   PHURINE 5.0 06/22/2024 1223   GLUCOSEU NEGATIVE 06/22/2024 1223   HGBUR NEGATIVE 06/22/2024 1223   HGBUR trace-lysed 06/16/2010 0833    BILIRUBINUR NEGATIVE 06/22/2024 1223   BILIRUBINUR n 05/22/2015 0927   KETONESUR NEGATIVE 06/22/2024 1223   PROTEINUR NEGATIVE 06/22/2024 1223   UROBILINOGEN 0.2 05/22/2015 0927   UROBILINOGEN 0.2 07/31/2014 2130   NITRITE NEGATIVE 06/22/2024 1223   LEUKOCYTESUR MODERATE (A) 06/22/2024 1223   Sepsis Labs: Invalid input(s): PROCALCITONIN, LACTICIDVEN  Microbiology: Recent Results (from the past 240 hours)  Resp panel by RT-PCR (RSV, Flu A&B, Covid) Anterior Nasal Swab     Status: None   Collection Time: 06/22/24 12:23 PM   Specimen: Anterior Nasal Swab  Result Value Ref Range Status   SARS Coronavirus 2 by RT PCR NEGATIVE NEGATIVE Final    Comment: (NOTE) SARS-CoV-2 target nucleic acids are NOT DETECTED.  The SARS-CoV-2 RNA is generally detectable in upper respiratory specimens during the acute phase of infection. The lowest concentration of SARS-CoV-2 viral copies this assay can detect is 138 copies/mL. A negative result does not preclude SARS-Cov-2 infection and should not be used as the sole basis for treatment or other patient management decisions. A negative result may occur with  improper specimen collection/handling, submission of specimen other than nasopharyngeal swab, presence of viral mutation(s) within the areas targeted by this assay, and inadequate number of viral copies(<138 copies/mL). A negative result must be combined with clinical observations, patient history, and epidemiological information. The expected result is Negative.  Fact Sheet for Patients:  bloggercourse.com  Fact Sheet for Healthcare Providers:  seriousbroker.it  This test is no t yet approved or cleared by the [place]  FDA and  has been authorized for detection and/or diagnosis of SARS-CoV-2 by FDA under an Emergency Use Authorization (EUA). This EUA will remain  in effect (meaning this test can be used) for the duration of  the COVID-19 declaration under Section 564(b)(1) of the Act, 21 U.S.C.section 360bbb-3(b)(1), unless the authorization is terminated  or revoked sooner.       Influenza A by PCR NEGATIVE NEGATIVE Final   Influenza B by PCR NEGATIVE NEGATIVE Final    Comment: (NOTE) The Xpert Xpress SARS-CoV-2/FLU/RSV plus assay is intended as an aid in the diagnosis of influenza from Nasopharyngeal swab specimens and should not be used as a  sole basis for treatment. Nasal washings and aspirates are unacceptable for Xpert Xpress SARS-CoV-2/FLU/RSV testing.  Fact Sheet for Patients: bloggercourse.com  Fact Sheet for Healthcare Providers: seriousbroker.it  This test is not yet approved or cleared by the [place]  FDA and has been authorized for detection and/or diagnosis of SARS-CoV-2 by FDA under an Emergency Use Authorization (EUA). This EUA will remain in effect (meaning this test can be used) for the duration of the COVID-19 declaration under Section 564(b)(1) of the Act, 21 U.S.C. section 360bbb-3(b)(1), unless the authorization is terminated or revoked.     Resp Syncytial Virus by PCR NEGATIVE NEGATIVE Final    Comment: (NOTE) Fact Sheet for Patients: bloggercourse.com  Fact Sheet for Healthcare Providers: seriousbroker.it  This test is not yet approved or cleared by the [place]  FDA and has been authorized for detection and/or diagnosis of SARS-CoV-2 by FDA under an Emergency Use Authorization (EUA). This EUA will remain in effect (meaning this test can be used) for the duration of the COVID-19 declaration under Section 564(b)(1) of the Act, 21 U.S.C. section 360bbb-3(b)(1), unless the authorization is terminated or revoked.  Performed at [facility], [address], [place], [place] [address]   Urine Culture     Status: Abnormal (Preliminary result)    Collection Time: 06/22/24 12:23 PM   Specimen: Urine, Clean Catch  Result Value Ref Range Status   Specimen Description   Final    URINE, CLEAN CATCH Performed at [facility], [address], [place], [place] [address]    Special Requests   Final    NONE Performed at [facility], [address], [place], [place] [address]    Culture (A)  Final    >=100,000 COLONIES/mL GRAM NEGATIVE RODS SUSCEPTIBILITIES TO FOLLOW Performed at [facility], [address] [place], [place] [address]    Report Status PENDING  Incomplete  Blood culture (routine x 2)     Status: None (Preliminary result)   Collection Time: 06/22/24 12:23 PM   Specimen: BLOOD  Result Value Ref Range Status   Specimen Description   Final    BLOOD LEFT ANTECUBITAL Performed at [facility], [address] [place], [place] [address]    Special Requests   Final    BOTTLES DRAWN AEROBIC AND ANAEROBIC Blood Culture adequate volume Performed at [facility], [address] [place], [place] [address]    Culture   Final    NO GROWTH < 12 HOURS Performed at [facility], [address] [place], [place] [address]    Report Status PENDING  Incomplete  Blood culture (routine x 2)     Status: None (Preliminary result)   Collection Time: 06/22/24  4:55 PM   Specimen: BLOOD LEFT FOREARM  Result Value Ref Range Status   Specimen Description   Final    BLOOD LEFT FOREARM Performed at [facility], [address] [place], [place] [address]    Special Requests   Final    BOTTLES DRAWN AEROBIC AND ANAEROBIC Blood Culture adequate volume Performed at [facility], [address], [place], [place] [address]    Culture   Final    NO GROWTH < 12 HOURS Performed at [facility], [address], [place], [place] [address]    Report Status PENDING  Incomplete    Radiology Studies: US  Renal Transplant  w/Doppler Result Date: 06/22/2024 EXAM: ULTRASOUND OF RENAL TRANSPLANT 06/22/2024 09:36:11 PM TECHNIQUE:  Ultrasound examination of the renal transplant was performed with gray-scale and color Doppler evaluation. COMPARISON: None available CLINICAL HISTORY: [id number] Acute renal failure superimposed on stage 4 chronic kidney disease ([name]) [id number]; [id number] Renal transplant recipient [id number] ICD [id number]: Acute renal failure superimposed on stage 4 chronic kidney disease ([name]). ICD [id number]: Renal transplant recipient. FINDINGS: Transplant kidney location: Right lower quadrant Transplant kidney description: Renal measurements: 11.9 by 5.0 by 4.2 cm = volume:  128 mL. Within normal limits in parenchymal echogenicity. No evidence of mass or hydronephrosis. No peri-transplant fluid collection seen. Resistive indices are as follows: main renal artery 0.77 intrarenal at the upper pole corticomedullary junction, 0.8 Int. rarenal at the lower pole corticomedullary junction 0.8 There is a normal venous waveform in the main renal vein. Color flow in the main renal artery at the hilum: Yes Color flow in the main renal vein at the hilum: Yes Bladder: Normal for degree of bladder distention. Other findings: None. IMPRESSION: 1. No evidence of acute abnormality in the renal transplant. Electronically signed by: Franky Stanford MD 06/22/2024 09:47 PM EST RP Workstation: [id number]   CT Head Wo Contrast Result Date: 06/22/2024 EXAM: CT HEAD WITHOUT CONTRAST 06/22/2024 01:30:49 PM TECHNIQUE: CT of the head was performed without the administration of intravenous contrast. Automated exposure control, iterative reconstruction, and/or weight based adjustment of the mA/kV was utilized to reduce the radiation dose to as low as reasonably achievable. COMPARISON: CT head 01/18/2024. CLINICAL HISTORY: Headache, new onset (Age >= 51y) FINDINGS: BRAIN AND VENTRICLES: No acute hemorrhage. No evidence of acute infarct. No hydrocephalus. No extra-axial  collection. No mass effect or midline shift. Patchy white matter hypodensities, compatible with chronic microvascular ischemic change. ORBITS: No acute abnormality. SINUSES: No acute abnormality. SOFT TISSUES AND SKULL: No acute soft tissue abnormality. No skull fracture. IMPRESSION: 1. No acute intracranial abnormality. Electronically signed by: Glendia Molt MD 06/22/2024 01:46 PM EST RP Workstation: [id number]   DG Chest Portable 1 View Result Date: 06/22/2024 CLINICAL DATA:  Weakness. EXAM: PORTABLE CHEST 1 VIEW COMPARISON:  January 18, 2024. FINDINGS: The heart size and mediastinal contours are within normal limits. Both lungs are clear. The visualized skeletal structures are unremarkable. IMPRESSION: No active disease. Electronically Signed   By: Lynwood Landy Raddle M.D.   On: 06/22/2024 13:45      Isolde Skaff T. Taevin Mcferran [facility]  If 7PM-7AM, please contact night-coverage [email or web address] 06/23/2024, 12:49 PM   "

## 2024-06-23 NOTE — Progress Notes (Signed)
 Bilateral lower extremity venous duplex has been completed.  Results can be found in chart review under CV Proc.  06/23/2024 3:21 PM  [employer], RVT.

## 2024-08-02 ENCOUNTER — Ambulatory Visit: Admitting: Podiatry

## 2024-10-27 ENCOUNTER — Ambulatory Visit

## 2024-10-27 ENCOUNTER — Ambulatory Visit (HOSPITAL_COMMUNITY)
